# Patient Record
Sex: Male | Born: 1950
Health system: Southern US, Community
[De-identification: ages and names within clinical notes are randomized; demographics above are authoritative.]

## PROBLEM LIST (undated history)

## (undated) DIAGNOSIS — E785 Hyperlipidemia, unspecified: Secondary | ICD-10-CM

## (undated) DIAGNOSIS — F0390 Unspecified dementia without behavioral disturbance: Secondary | ICD-10-CM

## (undated) DIAGNOSIS — L03116 Cellulitis of left lower limb: Secondary | ICD-10-CM

## (undated) DIAGNOSIS — Z87440 Personal history of urinary (tract) infections: Secondary | ICD-10-CM

## (undated) DIAGNOSIS — K7460 Unspecified cirrhosis of liver: Secondary | ICD-10-CM

## (undated) DIAGNOSIS — K429 Umbilical hernia without obstruction or gangrene: Secondary | ICD-10-CM

## (undated) DIAGNOSIS — A539 Syphilis, unspecified: Secondary | ICD-10-CM

## (undated) DIAGNOSIS — M109 Gout, unspecified: Secondary | ICD-10-CM

## (undated) DIAGNOSIS — Z87448 Personal history of other diseases of urinary system: Secondary | ICD-10-CM

## (undated) DIAGNOSIS — D649 Anemia, unspecified: Secondary | ICD-10-CM

## (undated) DIAGNOSIS — J42 Unspecified chronic bronchitis: Secondary | ICD-10-CM

## (undated) DIAGNOSIS — Z87898 Personal history of other specified conditions: Secondary | ICD-10-CM

## (undated) DIAGNOSIS — I10 Essential (primary) hypertension: Secondary | ICD-10-CM

## (undated) DIAGNOSIS — Z96652 Presence of left artificial knee joint: Secondary | ICD-10-CM

## (undated) DIAGNOSIS — M1990 Unspecified osteoarthritis, unspecified site: Secondary | ICD-10-CM

## (undated) DIAGNOSIS — K219 Gastro-esophageal reflux disease without esophagitis: Secondary | ICD-10-CM

## (undated) DIAGNOSIS — B20 Human immunodeficiency virus [HIV] disease: Secondary | ICD-10-CM

## (undated) DIAGNOSIS — N400 Enlarged prostate without lower urinary tract symptoms: Secondary | ICD-10-CM

## (undated) DIAGNOSIS — B977 Papillomavirus as the cause of diseases classified elsewhere: Secondary | ICD-10-CM

## (undated) DIAGNOSIS — A6002 Herpesviral infection of other male genital organs: Secondary | ICD-10-CM

## (undated) DIAGNOSIS — B182 Chronic viral hepatitis C: Secondary | ICD-10-CM

## (undated) DIAGNOSIS — Z21 Asymptomatic human immunodeficiency virus [HIV] infection status: Secondary | ICD-10-CM

## (undated) DIAGNOSIS — K5900 Constipation, unspecified: Secondary | ICD-10-CM

## (undated) DIAGNOSIS — J449 Chronic obstructive pulmonary disease, unspecified: Secondary | ICD-10-CM

## (undated) HISTORY — PX: TOTAL KNEE ARTHROPLASTY: SHX125

## (undated) HISTORY — DX: Essential (primary) hypertension: I10

## (undated) HISTORY — DX: Papillomavirus as the cause of diseases classified elsewhere: B97.7

## (undated) HISTORY — DX: Cellulitis of left lower limb: L03.116

## (undated) HISTORY — DX: Presence of left artificial knee joint: Z96.652

## (undated) HISTORY — DX: Chronic viral hepatitis C: K74.60

## (undated) HISTORY — DX: Human immunodeficiency virus (HIV) disease: B20

## (undated) HISTORY — DX: Hyperlipidemia, unspecified: E78.5

## (undated) HISTORY — PX: HERNIA REPAIR: SHX51

## (undated) HISTORY — DX: Chronic viral hepatitis C: B18.2

## (undated) HISTORY — DX: Chronic obstructive pulmonary disease, unspecified: J44.9

## (undated) HISTORY — DX: Gastro-esophageal reflux disease without esophagitis: K21.9

## (undated) HISTORY — PX: COLONOSCOPY: SHX174

## (undated) HISTORY — DX: Benign prostatic hyperplasia without lower urinary tract symptoms: N40.0

## (undated) HISTORY — DX: Anemia, unspecified: D64.9

## (undated) HISTORY — DX: Asymptomatic human immunodeficiency virus (hiv) infection status: Z21

## (undated) HISTORY — PX: KNEE SURGERY: SHX244

---

## 2000-01-16 ENCOUNTER — Encounter (INDEPENDENT_AMBULATORY_CARE_PROVIDER_SITE_OTHER): Payer: Self-pay | Admitting: *Deleted

## 2000-01-16 ENCOUNTER — Encounter: Admission: RE | Admit: 2000-01-16 | Discharge: 2000-01-16 | Payer: Self-pay | Admitting: Infectious Diseases

## 2000-01-16 ENCOUNTER — Ambulatory Visit (HOSPITAL_COMMUNITY): Admission: RE | Admit: 2000-01-16 | Discharge: 2000-01-16 | Payer: Self-pay | Admitting: Infectious Diseases

## 2000-01-16 LAB — CONVERTED CEMR LAB: CD4 T Cell Abs: 390

## 2000-02-05 ENCOUNTER — Encounter: Admission: RE | Admit: 2000-02-05 | Discharge: 2000-02-05 | Payer: Self-pay | Admitting: Infectious Diseases

## 2000-04-07 ENCOUNTER — Encounter: Admission: RE | Admit: 2000-04-07 | Discharge: 2000-04-07 | Payer: Self-pay | Admitting: Internal Medicine

## 2000-04-13 ENCOUNTER — Encounter: Admission: RE | Admit: 2000-04-13 | Discharge: 2000-04-13 | Payer: Self-pay | Admitting: Infectious Diseases

## 2000-04-13 ENCOUNTER — Ambulatory Visit (HOSPITAL_COMMUNITY): Admission: RE | Admit: 2000-04-13 | Discharge: 2000-04-13 | Payer: Self-pay | Admitting: Infectious Diseases

## 2000-04-27 ENCOUNTER — Encounter: Admission: RE | Admit: 2000-04-27 | Discharge: 2000-04-27 | Payer: Self-pay | Admitting: Infectious Diseases

## 2000-05-25 ENCOUNTER — Encounter: Admission: RE | Admit: 2000-05-25 | Discharge: 2000-05-25 | Payer: Self-pay | Admitting: Infectious Diseases

## 2000-08-24 ENCOUNTER — Ambulatory Visit (HOSPITAL_COMMUNITY): Admission: RE | Admit: 2000-08-24 | Discharge: 2000-08-24 | Payer: Self-pay | Admitting: Infectious Diseases

## 2000-08-24 ENCOUNTER — Encounter: Admission: RE | Admit: 2000-08-24 | Discharge: 2000-08-24 | Payer: Self-pay | Admitting: Infectious Diseases

## 2000-09-09 ENCOUNTER — Encounter: Admission: RE | Admit: 2000-09-09 | Discharge: 2000-09-09 | Payer: Self-pay | Admitting: Infectious Diseases

## 2000-11-04 ENCOUNTER — Encounter: Admission: RE | Admit: 2000-11-04 | Discharge: 2000-11-04 | Payer: Self-pay | Admitting: Infectious Diseases

## 2001-01-05 ENCOUNTER — Ambulatory Visit (HOSPITAL_COMMUNITY): Admission: RE | Admit: 2001-01-05 | Discharge: 2001-01-05 | Payer: Self-pay | Admitting: Infectious Diseases

## 2001-01-05 ENCOUNTER — Encounter: Admission: RE | Admit: 2001-01-05 | Discharge: 2001-01-05 | Payer: Self-pay | Admitting: Infectious Diseases

## 2001-02-08 ENCOUNTER — Encounter: Admission: RE | Admit: 2001-02-08 | Discharge: 2001-02-08 | Payer: Self-pay | Admitting: Infectious Diseases

## 2001-03-11 ENCOUNTER — Encounter: Admission: RE | Admit: 2001-03-11 | Discharge: 2001-03-11 | Payer: Self-pay | Admitting: Internal Medicine

## 2001-05-17 ENCOUNTER — Encounter: Admission: RE | Admit: 2001-05-17 | Discharge: 2001-05-17 | Payer: Self-pay | Admitting: Infectious Diseases

## 2001-05-17 ENCOUNTER — Ambulatory Visit (HOSPITAL_COMMUNITY): Admission: RE | Admit: 2001-05-17 | Discharge: 2001-05-17 | Payer: Self-pay | Admitting: Infectious Diseases

## 2001-07-12 ENCOUNTER — Encounter: Admission: RE | Admit: 2001-07-12 | Discharge: 2001-07-12 | Payer: Self-pay | Admitting: Infectious Diseases

## 2001-09-10 ENCOUNTER — Ambulatory Visit (HOSPITAL_COMMUNITY): Admission: RE | Admit: 2001-09-10 | Discharge: 2001-09-10 | Payer: Self-pay | Admitting: Infectious Diseases

## 2001-09-10 ENCOUNTER — Encounter: Admission: RE | Admit: 2001-09-10 | Discharge: 2001-09-10 | Payer: Self-pay | Admitting: Infectious Diseases

## 2001-09-27 ENCOUNTER — Encounter: Admission: RE | Admit: 2001-09-27 | Discharge: 2001-09-27 | Payer: Self-pay | Admitting: Infectious Diseases

## 2001-11-09 ENCOUNTER — Encounter: Admission: RE | Admit: 2001-11-09 | Discharge: 2001-11-09 | Payer: Self-pay | Admitting: Infectious Diseases

## 2001-11-18 ENCOUNTER — Encounter: Admission: RE | Admit: 2001-11-18 | Discharge: 2001-11-18 | Payer: Self-pay | Admitting: Infectious Diseases

## 2002-01-03 ENCOUNTER — Ambulatory Visit (HOSPITAL_COMMUNITY): Admission: RE | Admit: 2002-01-03 | Discharge: 2002-01-03 | Payer: Self-pay | Admitting: Infectious Diseases

## 2002-01-03 ENCOUNTER — Encounter: Admission: RE | Admit: 2002-01-03 | Discharge: 2002-01-03 | Payer: Self-pay | Admitting: Infectious Diseases

## 2002-01-25 ENCOUNTER — Encounter: Admission: RE | Admit: 2002-01-25 | Discharge: 2002-01-25 | Payer: Self-pay | Admitting: Infectious Diseases

## 2002-02-14 ENCOUNTER — Encounter: Admission: RE | Admit: 2002-02-14 | Discharge: 2002-02-14 | Payer: Self-pay | Admitting: Infectious Diseases

## 2002-02-14 ENCOUNTER — Ambulatory Visit (HOSPITAL_COMMUNITY): Admission: RE | Admit: 2002-02-14 | Discharge: 2002-02-14 | Payer: Self-pay | Admitting: Infectious Diseases

## 2002-02-14 ENCOUNTER — Encounter: Payer: Self-pay | Admitting: Infectious Diseases

## 2002-02-21 ENCOUNTER — Encounter: Admission: RE | Admit: 2002-02-21 | Discharge: 2002-02-21 | Payer: Self-pay | Admitting: Infectious Diseases

## 2002-06-21 ENCOUNTER — Encounter: Admission: RE | Admit: 2002-06-21 | Discharge: 2002-06-21 | Payer: Self-pay | Admitting: Infectious Diseases

## 2002-06-21 ENCOUNTER — Encounter (INDEPENDENT_AMBULATORY_CARE_PROVIDER_SITE_OTHER): Payer: Self-pay | Admitting: Infectious Diseases

## 2002-07-07 ENCOUNTER — Encounter: Admission: RE | Admit: 2002-07-07 | Discharge: 2002-07-07 | Payer: Self-pay | Admitting: Infectious Diseases

## 2002-08-17 ENCOUNTER — Encounter: Admission: RE | Admit: 2002-08-17 | Discharge: 2002-08-17 | Payer: Self-pay | Admitting: Infectious Diseases

## 2002-10-05 ENCOUNTER — Encounter: Admission: RE | Admit: 2002-10-05 | Discharge: 2002-10-05 | Payer: Self-pay | Admitting: Infectious Diseases

## 2002-10-05 ENCOUNTER — Ambulatory Visit (HOSPITAL_COMMUNITY): Admission: RE | Admit: 2002-10-05 | Discharge: 2002-10-05 | Payer: Self-pay | Admitting: Infectious Diseases

## 2002-10-05 ENCOUNTER — Encounter (INDEPENDENT_AMBULATORY_CARE_PROVIDER_SITE_OTHER): Payer: Self-pay | Admitting: Infectious Diseases

## 2002-10-19 ENCOUNTER — Encounter: Admission: RE | Admit: 2002-10-19 | Discharge: 2002-10-19 | Payer: Self-pay | Admitting: Infectious Diseases

## 2003-02-06 ENCOUNTER — Encounter (INDEPENDENT_AMBULATORY_CARE_PROVIDER_SITE_OTHER): Payer: Self-pay | Admitting: Infectious Diseases

## 2003-02-06 ENCOUNTER — Ambulatory Visit (HOSPITAL_COMMUNITY): Admission: RE | Admit: 2003-02-06 | Discharge: 2003-02-06 | Payer: Self-pay | Admitting: Infectious Diseases

## 2003-02-06 ENCOUNTER — Encounter: Admission: RE | Admit: 2003-02-06 | Discharge: 2003-02-06 | Payer: Self-pay | Admitting: Infectious Diseases

## 2003-02-20 ENCOUNTER — Encounter: Admission: RE | Admit: 2003-02-20 | Discharge: 2003-02-20 | Payer: Self-pay | Admitting: Infectious Diseases

## 2003-05-22 ENCOUNTER — Ambulatory Visit (HOSPITAL_COMMUNITY): Admission: RE | Admit: 2003-05-22 | Discharge: 2003-05-22 | Payer: Self-pay | Admitting: Infectious Diseases

## 2003-05-22 ENCOUNTER — Encounter: Admission: RE | Admit: 2003-05-22 | Discharge: 2003-05-22 | Payer: Self-pay | Admitting: Infectious Diseases

## 2003-06-05 ENCOUNTER — Encounter: Admission: RE | Admit: 2003-06-05 | Discharge: 2003-06-05 | Payer: Self-pay | Admitting: Infectious Diseases

## 2003-10-11 ENCOUNTER — Ambulatory Visit: Payer: Self-pay | Admitting: Infectious Diseases

## 2003-10-11 ENCOUNTER — Ambulatory Visit (HOSPITAL_COMMUNITY): Admission: RE | Admit: 2003-10-11 | Discharge: 2003-10-11 | Payer: Self-pay | Admitting: Infectious Diseases

## 2003-10-25 ENCOUNTER — Ambulatory Visit: Payer: Self-pay | Admitting: Infectious Diseases

## 2004-01-08 ENCOUNTER — Ambulatory Visit (HOSPITAL_COMMUNITY): Admission: RE | Admit: 2004-01-08 | Discharge: 2004-01-08 | Payer: Self-pay | Admitting: Infectious Diseases

## 2004-01-08 ENCOUNTER — Ambulatory Visit: Payer: Self-pay | Admitting: Infectious Diseases

## 2004-01-26 ENCOUNTER — Ambulatory Visit: Payer: Self-pay | Admitting: Infectious Diseases

## 2004-04-18 ENCOUNTER — Ambulatory Visit (HOSPITAL_COMMUNITY): Admission: RE | Admit: 2004-04-18 | Discharge: 2004-04-18 | Payer: Self-pay | Admitting: Infectious Diseases

## 2004-04-18 ENCOUNTER — Ambulatory Visit: Payer: Self-pay | Admitting: Infectious Diseases

## 2004-05-01 ENCOUNTER — Ambulatory Visit: Payer: Self-pay | Admitting: Infectious Diseases

## 2004-08-07 ENCOUNTER — Ambulatory Visit: Payer: Self-pay | Admitting: Infectious Diseases

## 2004-08-07 ENCOUNTER — Ambulatory Visit (HOSPITAL_COMMUNITY): Admission: RE | Admit: 2004-08-07 | Discharge: 2004-08-07 | Payer: Self-pay | Admitting: Infectious Diseases

## 2004-08-26 ENCOUNTER — Ambulatory Visit: Payer: Self-pay | Admitting: Infectious Diseases

## 2004-12-02 ENCOUNTER — Ambulatory Visit: Payer: Self-pay | Admitting: Infectious Diseases

## 2004-12-02 ENCOUNTER — Encounter (INDEPENDENT_AMBULATORY_CARE_PROVIDER_SITE_OTHER): Payer: Self-pay | Admitting: *Deleted

## 2004-12-02 ENCOUNTER — Ambulatory Visit (HOSPITAL_COMMUNITY): Admission: RE | Admit: 2004-12-02 | Discharge: 2004-12-02 | Payer: Self-pay | Admitting: Infectious Diseases

## 2004-12-02 LAB — CONVERTED CEMR LAB
CD4 Count: 590 microliters
HIV 1 RNA Quant: 49 copies/mL

## 2004-12-23 ENCOUNTER — Ambulatory Visit: Payer: Self-pay | Admitting: Infectious Diseases

## 2005-03-24 ENCOUNTER — Emergency Department (HOSPITAL_COMMUNITY): Admission: EM | Admit: 2005-03-24 | Discharge: 2005-03-24 | Payer: Self-pay | Admitting: Emergency Medicine

## 2005-06-23 ENCOUNTER — Ambulatory Visit (HOSPITAL_COMMUNITY): Admission: RE | Admit: 2005-06-23 | Discharge: 2005-06-23 | Payer: Self-pay | Admitting: Infectious Diseases

## 2005-06-23 ENCOUNTER — Encounter (INDEPENDENT_AMBULATORY_CARE_PROVIDER_SITE_OTHER): Payer: Self-pay | Admitting: *Deleted

## 2005-06-23 ENCOUNTER — Ambulatory Visit: Payer: Self-pay | Admitting: Infectious Diseases

## 2005-06-23 LAB — CONVERTED CEMR LAB: HIV 1 RNA Quant: 49 copies/mL

## 2005-07-21 ENCOUNTER — Ambulatory Visit: Payer: Self-pay | Admitting: Infectious Diseases

## 2005-12-04 ENCOUNTER — Encounter: Admission: RE | Admit: 2005-12-04 | Discharge: 2005-12-04 | Payer: Self-pay | Admitting: Infectious Diseases

## 2005-12-04 ENCOUNTER — Encounter (INDEPENDENT_AMBULATORY_CARE_PROVIDER_SITE_OTHER): Payer: Self-pay | Admitting: *Deleted

## 2005-12-04 ENCOUNTER — Ambulatory Visit: Payer: Self-pay | Admitting: Infectious Diseases

## 2005-12-04 LAB — CONVERTED CEMR LAB
ALT: 21 units/L (ref 0–53)
Alkaline Phosphatase: 85 units/L (ref 39–117)
CD4 Count: 720 microliters
Creatinine, Ser: 1.23 mg/dL (ref 0.40–1.50)
Eosinophils Relative: 2 % (ref 0–4)
HIV 1 RNA Quant: 423 copies/mL
HIV 1 RNA Quant: 423 copies/mL — ABNORMAL HIGH (ref <50)
HIV-1 RNA Quant, Log: 2.63 — ABNORMAL HIGH (ref <1.70)
Hemoglobin: 14.2 g/dL (ref 13.9–16.8)
Lymphs Abs: 1.9 10*3/uL (ref 0.8–3.1)
MCHC: 34.1 g/dL (ref 33.1–35.4)
Monocytes Absolute: 0.6 10*3/uL (ref 0.2–0.7)
Neutro Abs: 2.6 10*3/uL (ref 1.8–6.8)
RBC: 4.58 M/uL (ref 4.20–5.50)

## 2005-12-15 ENCOUNTER — Ambulatory Visit: Payer: Self-pay | Admitting: Infectious Diseases

## 2006-03-30 ENCOUNTER — Encounter (INDEPENDENT_AMBULATORY_CARE_PROVIDER_SITE_OTHER): Payer: Self-pay | Admitting: *Deleted

## 2006-03-30 LAB — CONVERTED CEMR LAB: HCV Quantitative: 553000 intl units/mL

## 2006-04-12 ENCOUNTER — Encounter (INDEPENDENT_AMBULATORY_CARE_PROVIDER_SITE_OTHER): Payer: Self-pay | Admitting: *Deleted

## 2006-04-28 ENCOUNTER — Telehealth (INDEPENDENT_AMBULATORY_CARE_PROVIDER_SITE_OTHER): Payer: Self-pay | Admitting: Infectious Diseases

## 2006-06-22 ENCOUNTER — Ambulatory Visit: Payer: Self-pay | Admitting: Infectious Diseases

## 2006-06-22 ENCOUNTER — Encounter: Admission: RE | Admit: 2006-06-22 | Discharge: 2006-06-22 | Payer: Self-pay | Admitting: Infectious Diseases

## 2006-07-03 DIAGNOSIS — A512 Primary syphilis of other sites: Secondary | ICD-10-CM | POA: Insufficient documentation

## 2006-07-03 DIAGNOSIS — B20 Human immunodeficiency virus [HIV] disease: Secondary | ICD-10-CM

## 2006-07-03 DIAGNOSIS — B1710 Acute hepatitis C without hepatic coma: Secondary | ICD-10-CM | POA: Insufficient documentation

## 2006-07-03 DIAGNOSIS — M1990 Unspecified osteoarthritis, unspecified site: Secondary | ICD-10-CM | POA: Insufficient documentation

## 2006-07-06 ENCOUNTER — Ambulatory Visit: Payer: Self-pay | Admitting: Infectious Diseases

## 2006-07-20 ENCOUNTER — Encounter (INDEPENDENT_AMBULATORY_CARE_PROVIDER_SITE_OTHER): Payer: Self-pay | Admitting: Infectious Diseases

## 2006-10-26 ENCOUNTER — Encounter: Admission: RE | Admit: 2006-10-26 | Discharge: 2006-10-26 | Payer: Self-pay | Admitting: Infectious Disease

## 2006-10-26 ENCOUNTER — Ambulatory Visit: Payer: Self-pay | Admitting: Infectious Disease

## 2006-10-26 LAB — CONVERTED CEMR LAB
ALT: 29 units/L (ref 0–53)
Alkaline Phosphatase: 97 units/L (ref 39–117)
BUN: 24 mg/dL — ABNORMAL HIGH (ref 6–23)
Basophils Absolute: 0 10*3/uL (ref 0.0–0.1)
Basophils Relative: 0 % (ref 0–1)
CO2: 23 meq/L (ref 19–32)
Calcium: 9.3 mg/dL (ref 8.4–10.5)
Chloride: 103 meq/L (ref 96–112)
Eosinophils Absolute: 0.1 10*3/uL (ref 0.0–0.7)
Glucose, Bld: 100 mg/dL — ABNORMAL HIGH (ref 70–99)
HCT: 44.5 % (ref 39.0–52.0)
HIV 1 RNA Quant: 54 copies/mL — ABNORMAL HIGH (ref <50)
HIV-1 RNA Quant, Log: 1.73 — ABNORMAL HIGH (ref <1.70)
Lymphocytes Relative: 46 % (ref 12–46)
Lymphs Abs: 2.3 10*3/uL (ref 0.7–3.3)
MCV: 91 fL (ref 78.0–100.0)
Monocytes Absolute: 0.5 10*3/uL (ref 0.2–0.7)
Neutrophils Relative %: 40 % — ABNORMAL LOW (ref 43–77)
RBC: 4.89 M/uL (ref 4.22–5.81)
RDW: 13.5 % (ref 11.5–14.0)
Sodium: 137 meq/L (ref 135–145)
Total Bilirubin: 0.4 mg/dL (ref 0.3–1.2)

## 2006-11-19 ENCOUNTER — Ambulatory Visit: Payer: Self-pay | Admitting: Infectious Disease

## 2006-11-19 DIAGNOSIS — K429 Umbilical hernia without obstruction or gangrene: Secondary | ICD-10-CM | POA: Insufficient documentation

## 2006-11-19 DIAGNOSIS — J42 Unspecified chronic bronchitis: Secondary | ICD-10-CM | POA: Insufficient documentation

## 2006-11-19 DIAGNOSIS — M109 Gout, unspecified: Secondary | ICD-10-CM

## 2006-11-19 DIAGNOSIS — F17200 Nicotine dependence, unspecified, uncomplicated: Secondary | ICD-10-CM

## 2006-11-19 DIAGNOSIS — I10 Essential (primary) hypertension: Secondary | ICD-10-CM | POA: Insufficient documentation

## 2006-11-19 DIAGNOSIS — A6000 Herpesviral infection of urogenital system, unspecified: Secondary | ICD-10-CM | POA: Insufficient documentation

## 2006-11-19 DIAGNOSIS — J40 Bronchitis, not specified as acute or chronic: Secondary | ICD-10-CM | POA: Insufficient documentation

## 2006-11-24 ENCOUNTER — Ambulatory Visit: Payer: Self-pay | Admitting: Infectious Disease

## 2006-11-24 LAB — CONVERTED CEMR LAB
Cholesterol: 164 mg/dL (ref 0–200)
HDL: 51 mg/dL (ref >39)
Triglycerides: 141 mg/dL (ref <150)
Uric Acid, Serum: 4.6 mg/dL (ref 2.4–7.0)

## 2007-02-02 ENCOUNTER — Encounter: Payer: Self-pay | Admitting: Infectious Disease

## 2007-04-29 ENCOUNTER — Encounter: Payer: Self-pay | Admitting: Infectious Disease

## 2007-05-25 ENCOUNTER — Emergency Department (HOSPITAL_COMMUNITY): Admission: EM | Admit: 2007-05-25 | Discharge: 2007-05-25 | Payer: Self-pay | Admitting: Emergency Medicine

## 2007-05-26 ENCOUNTER — Telehealth: Payer: Self-pay | Admitting: Infectious Disease

## 2007-05-28 ENCOUNTER — Encounter: Payer: Self-pay | Admitting: Infectious Disease

## 2007-05-28 ENCOUNTER — Ambulatory Visit: Payer: Self-pay | Admitting: Internal Medicine

## 2007-05-28 DIAGNOSIS — N10 Acute tubulo-interstitial nephritis: Secondary | ICD-10-CM | POA: Insufficient documentation

## 2007-10-03 ENCOUNTER — Emergency Department (HOSPITAL_COMMUNITY): Admission: EM | Admit: 2007-10-03 | Discharge: 2007-10-03 | Payer: Self-pay | Admitting: Emergency Medicine

## 2007-10-07 ENCOUNTER — Emergency Department (HOSPITAL_COMMUNITY): Admission: EM | Admit: 2007-10-07 | Discharge: 2007-10-07 | Payer: Self-pay | Admitting: Emergency Medicine

## 2007-10-28 ENCOUNTER — Ambulatory Visit: Payer: Self-pay | Admitting: Infectious Disease

## 2007-10-28 LAB — CONVERTED CEMR LAB
AST: 57 units/L — ABNORMAL HIGH (ref 0–37)
Albumin: 3.9 g/dL (ref 3.5–5.2)
Alkaline Phosphatase: 98 units/L (ref 39–117)
BUN: 17 mg/dL (ref 6–23)
Basophils Absolute: 0 10*3/uL (ref 0.0–0.1)
Basophils Relative: 1 % (ref 0–1)
CO2: 21 meq/L (ref 19–32)
Calcium: 9.6 mg/dL (ref 8.4–10.5)
Cholesterol: 180 mg/dL (ref 0–200)
HDL: 69 mg/dL (ref >39)
HIV 1 RNA Quant: 497 copies/mL — ABNORMAL HIGH (ref <50)
HIV-1 RNA Quant, Log: 2.7 — ABNORMAL HIGH (ref <1.70)
Lymphs Abs: 1.5 10*3/uL (ref 0.7–4.0)
MCHC: 35 g/dL (ref 30.0–36.0)
Monocytes Absolute: 0.6 10*3/uL (ref 0.1–1.0)
Sodium: 137 meq/L (ref 135–145)
Total Bilirubin: 0.5 mg/dL (ref 0.3–1.2)
Total CHOL/HDL Ratio: 2.6
Triglycerides: 171 mg/dL — ABNORMAL HIGH (ref <150)
VLDL: 34 mg/dL (ref 0–40)

## 2007-11-15 ENCOUNTER — Ambulatory Visit: Payer: Self-pay | Admitting: Infectious Disease

## 2007-11-15 DIAGNOSIS — F068 Other specified mental disorders due to known physiological condition: Secondary | ICD-10-CM | POA: Insufficient documentation

## 2007-11-17 LAB — CONVERTED CEMR LAB
Chlamydia, Swab/Urine, PCR: NEGATIVE
GC Probe Amp, Urine: NEGATIVE

## 2007-11-22 ENCOUNTER — Ambulatory Visit (HOSPITAL_COMMUNITY): Admission: RE | Admit: 2007-11-22 | Discharge: 2007-11-22 | Payer: Self-pay | Admitting: Infectious Disease

## 2008-03-01 ENCOUNTER — Ambulatory Visit: Payer: Self-pay | Admitting: Infectious Disease

## 2008-03-01 ENCOUNTER — Encounter: Payer: Self-pay | Admitting: Infectious Disease

## 2008-03-01 LAB — CONVERTED CEMR LAB
ALT: 89 units/L — ABNORMAL HIGH (ref 0–53)
Albumin: 3.6 g/dL (ref 3.5–5.2)
Basophils Absolute: 0 10*3/uL (ref 0.0–0.1)
Cholesterol: 151 mg/dL (ref 0–200)
Creatinine, Ser: 1.1 mg/dL (ref 0.40–1.50)
Eosinophils Absolute: 0.1 10*3/uL (ref 0.0–0.7)
Eosinophils Relative: 3 % (ref 0–5)
HCT: 42.7 % (ref 39.0–52.0)
HCV Quantitative: 4440000 intl units/mL — ABNORMAL HIGH (ref <43)
HDL: 56 mg/dL (ref >39)
HIV 1 RNA Quant: <48 copies/mL (ref <48)
HIV-1 RNA Quant, Log: <1.68 (ref <1.68)
Hemoglobin: 14.3 g/dL (ref 13.0–17.0)
Lymphs Abs: 1.3 10*3/uL (ref 0.7–4.0)
Neutro Abs: 1.7 10*3/uL (ref 1.7–7.7)
Platelets: 214 10*3/uL (ref 150–400)
Potassium: 4.3 meq/L (ref 3.5–5.3)
RBC: 4.42 M/uL (ref 4.22–5.81)
Sodium: 142 meq/L (ref 135–145)
Total Bilirubin: 0.3 mg/dL (ref 0.3–1.2)
Total CHOL/HDL Ratio: 2.7
Triglycerides: 372 mg/dL — ABNORMAL HIGH (ref <150)
VLDL: 74 mg/dL — ABNORMAL HIGH (ref 0–40)

## 2008-03-02 ENCOUNTER — Encounter (INDEPENDENT_AMBULATORY_CARE_PROVIDER_SITE_OTHER): Payer: Self-pay | Admitting: Licensed Clinical Social Worker

## 2008-04-11 ENCOUNTER — Ambulatory Visit: Payer: Self-pay | Admitting: Infectious Disease

## 2008-04-11 LAB — CONVERTED CEMR LAB
ALT: 99 units/L — ABNORMAL HIGH (ref 0–53)
AST: 104 units/L — ABNORMAL HIGH (ref 0–37)
Basophils Absolute: 0 10*3/uL (ref 0.0–0.1)
Basophils Relative: 0 % (ref 0–1)
CO2: 25 meq/L (ref 19–32)
Eosinophils Absolute: 0.1 10*3/uL (ref 0.0–0.7)
Eosinophils Relative: 1 % (ref 0–5)
Glucose, Bld: 118 mg/dL — ABNORMAL HIGH (ref 70–99)
HIV-1 RNA Quant, Log: 2.3 — ABNORMAL HIGH (ref <1.68)
Hemoglobin: 14.7 g/dL (ref 13.0–17.0)
Lymphocytes Relative: 35 % (ref 12–46)
MCHC: 36.2 g/dL — ABNORMAL HIGH (ref 30.0–36.0)
Monocytes Relative: 14 % — ABNORMAL HIGH (ref 3–12)
Potassium: 4.1 meq/L (ref 3.5–5.3)
RBC: 4.46 M/uL (ref 4.22–5.81)
RDW: 13.7 % (ref 11.5–15.5)
Sodium: 140 meq/L (ref 135–145)
Total Protein: 8.2 g/dL (ref 6.0–8.3)
WBC: 4.6 10*3/uL (ref 4.0–10.5)

## 2008-04-26 ENCOUNTER — Ambulatory Visit: Payer: Self-pay | Admitting: Infectious Disease

## 2008-10-18 ENCOUNTER — Ambulatory Visit: Payer: Self-pay | Admitting: Infectious Disease

## 2008-10-18 LAB — CONVERTED CEMR LAB
ALT: 99 units/L — ABNORMAL HIGH (ref 0–53)
Basophils Absolute: 0 10*3/uL (ref 0.0–0.1)
Chloride: 104 meq/L (ref 96–112)
Eosinophils Absolute: 0.1 10*3/uL (ref 0.0–0.7)
Eosinophils Relative: 2 % (ref 0–5)
Glucose, Bld: 93 mg/dL (ref 70–99)
HCT: 42.3 % (ref 39.0–52.0)
HDL: 91 mg/dL (ref >39)
HIV 1 RNA Quant: 162 copies/mL — ABNORMAL HIGH (ref <48)
LDL Cholesterol: 59 mg/dL (ref 0–99)
Lymphs Abs: 1.5 10*3/uL (ref 0.7–4.0)
MCHC: 35 g/dL (ref 30.0–36.0)
Monocytes Absolute: 0.6 10*3/uL (ref 0.1–1.0)
Monocytes Relative: 10 % (ref 3–12)
Platelets: 164 10*3/uL (ref 150–400)
Potassium: 4.5 meq/L (ref 3.5–5.3)
Total Bilirubin: 0.5 mg/dL (ref 0.3–1.2)
Total Protein: 8.1 g/dL (ref 6.0–8.3)
Triglycerides: 118 mg/dL (ref <150)
VLDL: 24 mg/dL (ref 0–40)
WBC: 5.3 10*3/uL (ref 4.0–10.5)

## 2008-11-01 ENCOUNTER — Ambulatory Visit: Payer: Self-pay | Admitting: Infectious Disease

## 2008-11-01 DIAGNOSIS — M25569 Pain in unspecified knee: Secondary | ICD-10-CM

## 2008-11-06 ENCOUNTER — Telehealth: Payer: Self-pay | Admitting: Infectious Disease

## 2008-11-16 ENCOUNTER — Telehealth: Payer: Self-pay | Admitting: Infectious Disease

## 2009-03-29 ENCOUNTER — Ambulatory Visit: Payer: Self-pay | Admitting: Infectious Diseases

## 2009-03-29 LAB — CONVERTED CEMR LAB
ALT: 70 units/L — ABNORMAL HIGH (ref 0–53)
Albumin: 4 g/dL (ref 3.5–5.2)
Alkaline Phosphatase: 114 units/L (ref 39–117)
BUN: 24 mg/dL — ABNORMAL HIGH (ref 6–23)
Basophils Relative: 1 % (ref 0–1)
Calcium: 9.8 mg/dL (ref 8.4–10.5)
Eosinophils Absolute: 0.1 10*3/uL (ref 0.0–0.7)
Eosinophils Relative: 3 % (ref 0–5)
Glucose, Bld: 98 mg/dL (ref 70–99)
HDL: 60 mg/dL (ref >39)
HIV 1 RNA Quant: 54 copies/mL — ABNORMAL HIGH (ref <48)
HIV-1 RNA Quant, Log: 1.73 — ABNORMAL HIGH (ref <1.68)
Lymphs Abs: 1.8 10*3/uL (ref 0.7–4.0)
MCHC: 33.9 g/dL (ref 30.0–36.0)
MCV: 95.2 fL (ref >=78.0)
Neutrophils Relative %: 40 % — ABNORMAL LOW (ref 43–77)
Potassium: 4.3 meq/L (ref 3.5–5.3)
RBC: 4.21 M/uL — ABNORMAL LOW (ref 4.22–5.81)
VLDL: 35 mg/dL (ref 0–40)

## 2009-04-19 ENCOUNTER — Ambulatory Visit: Payer: Self-pay | Admitting: Infectious Disease

## 2009-04-26 ENCOUNTER — Encounter: Payer: Self-pay | Admitting: Infectious Disease

## 2009-05-07 ENCOUNTER — Telehealth: Payer: Self-pay | Admitting: Infectious Disease

## 2009-06-25 ENCOUNTER — Emergency Department (HOSPITAL_COMMUNITY): Admission: EM | Admit: 2009-06-25 | Discharge: 2009-06-25 | Payer: Self-pay | Admitting: Emergency Medicine

## 2009-10-22 ENCOUNTER — Encounter: Payer: Self-pay | Admitting: Infectious Disease

## 2009-10-29 ENCOUNTER — Ambulatory Visit: Payer: Self-pay | Admitting: Infectious Disease

## 2009-10-29 LAB — CONVERTED CEMR LAB
ALT: 24 units/L (ref 0–53)
Albumin: 4.2 g/dL (ref 3.5–5.2)
BUN: 21 mg/dL (ref 6–23)
Basophils Relative: 0 % (ref 0–1)
CO2: 23 meq/L (ref 19–32)
Calcium: 9.8 mg/dL (ref 8.4–10.5)
Glucose, Bld: 95 mg/dL (ref 70–99)
HIV-1 RNA Quant, Log: <1.3 (ref <1.30)
Lymphocytes Relative: 34 % (ref 12–46)
Lymphs Abs: 1.4 10*3/uL (ref 0.7–4.0)
MCHC: 35 g/dL (ref 30.0–36.0)
Monocytes Relative: 11 % (ref 3–12)
Neutro Abs: 2.2 10*3/uL (ref 1.7–7.7)
Potassium: 4.5 meq/L (ref 3.5–5.3)
RBC: 4.39 M/uL (ref 4.22–5.81)
Sodium: 140 meq/L (ref 135–145)
Total Protein: 7.6 g/dL (ref 6.0–8.3)
WBC: 4.1 10*3/uL (ref 4.0–10.5)

## 2009-11-12 ENCOUNTER — Ambulatory Visit: Payer: Self-pay | Admitting: Infectious Disease

## 2009-11-12 DIAGNOSIS — Z96659 Presence of unspecified artificial knee joint: Secondary | ICD-10-CM | POA: Insufficient documentation

## 2009-11-14 ENCOUNTER — Encounter: Payer: Self-pay | Admitting: Infectious Disease

## 2010-01-18 ENCOUNTER — Encounter (INDEPENDENT_AMBULATORY_CARE_PROVIDER_SITE_OTHER): Payer: Self-pay | Admitting: *Deleted

## 2010-02-24 ENCOUNTER — Encounter: Payer: Self-pay | Admitting: Infectious Disease

## 2010-03-03 LAB — CONVERTED CEMR LAB
ALT: 16 units/L (ref 0–53)
AST: 17 units/L (ref 0–37)
Albumin: 4 g/dL (ref 3.5–5.2)
Alkaline Phosphatase: 90 units/L (ref 39–117)
Basophils Absolute: 0 10*3/uL (ref 0.0–0.1)
Basophils Relative: 0 % (ref 0–1)
CO2: 23 meq/L (ref 19–32)
Calcium: 9.7 mg/dL (ref 8.4–10.5)
Chloride: 108 meq/L (ref 96–112)
Cholesterol: 202 mg/dL — ABNORMAL HIGH (ref 0–200)
Creatinine, Ser: 1.18 mg/dL (ref 0.40–1.50)
Eosinophils Relative: 2 % (ref 0–5)
Hemoglobin, Urine: NEGATIVE
Ketones, ur: NEGATIVE mg/dL
Leukocytes, UA: NEGATIVE
Lymphocytes Relative: 38 % (ref 12–46)
Neutrophils Relative %: 51 % (ref 43–77)
Protein, ur: NEGATIVE mg/dL
RDW: 14.7 % — ABNORMAL HIGH (ref 11.5–14.0)
Sodium: 140 meq/L (ref 135–145)
Total Bilirubin: 0.2 mg/dL — ABNORMAL LOW (ref 0.3–1.2)
Total CHOL/HDL Ratio: 4.9
Triglycerides: 421 mg/dL — ABNORMAL HIGH (ref <150)
WBC: 4.7 10*3/uL (ref 4.0–10.5)

## 2010-03-05 NOTE — Assessment & Plan Note (Signed)
Summary: resfrom 3/10   Visit Type:  Follow-up  CC:  follow-up visit and lab results.  History of Present Illness: 60 year old African American male with HIV, well controlled on atripla  and hepatitis C , geontype 1a, VL>4 million, HTN  We reviewed his labs with his VL now down to 54 and his CD4. His BP was not at goal and I had wanted to increase his lisinopril but he canot read and I wanted to make sure he fully understood which medicine to increase.  Problems Prior to Update: 1)  Knee Pain, Acute  (ICD-719.46) 2)  Dementia, Mild  (ICD-294.8) 3)  Pyelonephritis, Acute  (ICD-590.10) 4)  Bronchitis Nos  (ICD-490) 5)  Hernia, Umbilical  (ICD-553.1) 6)  Gout Nos  (ICD-274.9) 7)  Smoker  (ICD-305.1) 8)  Bronchitis, Chronic Nec  (ICD-491.8) 9)  Herpes, Genital Nec  (ICD-054.19) 10)  Hypertension, Benign Essential  (ICD-401.1) 11)  Syphilis, Early, Symptomatic, Primary Nec  (ICD-091.2) 12)  Osteoarthritis  (ICD-715.90) 13)  HIV Disease  (ICD-042) 14)  Hepatitis C  (ICD-070.51)  Medications Prior to Update: 1)  Atripla 600-200-300 Mg Tabs (Efavirenz-Emtricitab-Tenofovir) .... Take 1 Tablet By Mouth At Bedtime 2)  Valtrex 500 Mg  Tabs (Valacyclovir Hcl) .... Take 1 Tablet By Mouth Once A Day 3)  Hydrochlorothiazide 25 Mg  Tabs (Hydrochlorothiazide) .... Take 1 Tablet By Mouth Once A Day 4)  Lisinopril 10 Mg  Tabs (Lisinopril) .... Take 1 Tablet By Mouth Once A Day 5)  Ventolin Hfa 108 (90 Base) Mcg/act  Aers (Albuterol Sulfate) .... Two Puffs Every 6 Hours As Needed For Cough, Dyspnea 6)  Hydrocodone-Acetaminophen 5-500 Mg Tabs (Hydrocodone-Acetaminophen) .... Take One Pill Twic A Day As Needed For Pain 7)  Blood Pressure  Kit (Misc. Devices) .... Please Fit With Proper Automatic Cuff 8)  Flovent Diskus 100 Mcg/blist Aepb (Fluticasone Propionate (Inhal)) .... Two Puffs Twice Daily 9)  Qvar 80 Mcg/act Aers (Beclomethasone Dipropionate) .... Tatke Two Puffs Twice Daily (Rise With Mouthwash  After Use)  Current Medications (verified): 1)  Atripla 600-200-300 Mg Tabs (Efavirenz-Emtricitab-Tenofovir) .... Take 1 Tablet By Mouth At Bedtime 2)  Valtrex 500 Mg  Tabs (Valacyclovir Hcl) .... Take 1 Tablet By Mouth Once A Day 3)  Hydrochlorothiazide 25 Mg  Tabs (Hydrochlorothiazide) .... Take 1 Tablet By Mouth Once A Day 4)  Lisinopril 10 Mg  Tabs (Lisinopril) .... Take 1 Tablet By Mouth Once A Day 5)  Ventolin Hfa 108 (90 Base) Mcg/act  Aers (Albuterol Sulfate) .... Two Puffs Every 6 Hours As Needed For Cough, Dyspnea 6)  Hydrocodone-Acetaminophen 5-500 Mg Tabs (Hydrocodone-Acetaminophen) .... Take One Pill Twic A Day As Needed For Pain 7)  Blood Pressure  Kit (Misc. Devices) .... Please Fit With Proper Automatic Cuff 8)  Flovent Diskus 100 Mcg/blist Aepb (Fluticasone Propionate (Inhal)) .... Two Puffs Twice Daily 9)  Qvar 80 Mcg/act Aers (Beclomethasone Dipropionate) .... Tatke Two Puffs Twice Daily (Rise With Mouthwash After Use)  Allergies (verified): No Known Drug Allergies   Preventive Screening-Counseling & Management  Alcohol-Tobacco     Alcohol drinks/day: 0     Alcohol type: beer     Smoking Status: current     Smoking Cessation Counseling: 06/23/2005     Packs/Day: <0.25  Caffeine-Diet-Exercise     Caffeine use/day: tea and sodas     Does Patient Exercise: yes     Type of exercise: walking     Times/week: 5 days  Hep-HIV-STD-Contraception     HIV Risk  Counseling: 06/23/2005  Safety-Violence-Falls     Seat Belt Use: yes  Comments: pt. given condoms   Current Allergies (reviewed today): No known allergies  Past History:  Past Medical History: Last updated: 04/26/2008 Hepatitis C HIV disease Osteoarthritis, left knee Primary syphilis (All of his RPRs have been negative here going back to 2007) Dementia Likely cirrhosis Alcohol use (says he quit now)  Social History: Last updated: 04/26/2008 Current Smoker Alcohol use-NO Drug use-yes  Risk  Factors: Alcohol Use: 0 (04/19/2009) Caffeine Use: tea and sodas (04/19/2009) Exercise: yes (04/19/2009)  Risk Factors: Smoking Status: current (04/19/2009) Packs/Day: <0.25 (04/19/2009)  Review of Systems  The patient denies anorexia, fever, weight loss, weight gain, vision loss, decreased hearing, hoarseness, chest pain, syncope, dyspnea on exertion, peripheral edema, prolonged cough, headaches, hemoptysis, abdominal pain, melena, hematochezia, severe indigestion/heartburn, hematuria, incontinence, genital sores, muscle weakness, suspicious skin lesions, transient blindness, difficulty walking, depression, unusual weight change, abnormal bleeding, and enlarged lymph nodes.    Vital Signs:  Patient profile:   60 year old male Height:      69 inches (175.26 cm) Weight:      183.5 pounds (83.41 kg) BMI:     27.20 Temp:     97.5 degrees F (36.39 degrees C) oral Pulse rate:   70 / minute BP sitting:   133 / 91  (left arm)  Vitals Entered By: Wendall Mola CMA Duncan Dull) (April 19, 2009 10:27 AM) CC: follow-up visit, lab results Is Patient Diabetic? No Pain Assessment Patient in pain? no      Nutritional Status BMI of 25 - 29 = overweight Nutritional Status Detail appetite "good"  Does patient need assistance? Functional Status Self care Ambulation Normal Comments no missed doses of meds per patient   Physical Exam  General:  alert.  well-nourished and well-hydrated.   Head:  normocephalic and atraumatic.   Eyes:  vision grossly intact.   Ears:  no external deformities.   Nose:  no external deformity and no external erythema.   Mouth:  ,pharynx pink and moist, no erythema, and no exudates.   Neck:  supple and full ROM.   Lungs:  normal respiratory effort, no crackles, and no wheezes.   Heart:  normal rate, regular rhythm, no murmur, no gallop, and no rub.   Abdomen:  soft, non-tender, normal bowel sounds, and no distention.   Neurologic:  alert, oriented to clinic,  has trouble at times recalling specifics of recent events Skin:  no suspicious lesions and no ecchymoses.   Psych:  in good spirits        Medication Adherence: 04/19/2009   Adherence to medications reviewed with patient. Counseling to provide adequate adherence provided   Prevention For Positives: 04/19/2009   Safe sex practices discussed with patient. Condoms offered.   Education Materials Provided: 04/19/2009 Safe sex practices discussed with patient. Condoms offered.                          Impression & Recommendations:  Problem # 1:  HIV DISEASE (ICD-042) Excellent virological control! Diagnostics Reviewed:  HIV: HIV positive CDC defined AIDS   CD4: 760 (03/30/2009)   WBC: 4.1 (03/29/2009)   PMN (bands): 0 (04/11/2008)   Hgb: 13.6 (03/29/2009)   HCT: 40.1 (03/29/2009)   Platelets: 179 (03/29/2009) HIV genotype: TNP (11/15/2007)   HIV-1 RNA: 54 (03/29/2009)   HBSAg: No (03/30/2006)  Problem # 2:  HYPERTENSION, BENIGN ESSENTIAL (ICD-401.1) When he can bring his  medications in I will have increase his lisinopri to 20mg  and then recheck bmp a week after increasing the dose His updated medication list for this problem includes:    Hydrochlorothiazide 25 Mg Tabs (Hydrochlorothiazide) .Marland Kitchen... Take 1 tablet by mouth once a day    Lisinopril 10 Mg Tabs (Lisinopril) .Marland Kitchen... Take 1 tablet by mouth once a day  Orders: Est. Patient Level IV (99214)Future Orders: T-Lipid Profile ([id number]) ... 08/06/2009  Problem # 3:  DEMENTIA, MILD (ICD-294.8)  He has help with his girilfriend and anoter lady who helps him.  Orders: Est. Patient Level IV (29528)  Problem # 4:  SYPHILIS, EARLY, SYMPTOMATIC, PRIMARY NEC (ICD-091.2)  as mentioned before all rprs since 2007 negative  Orders: Est. Patient Level IV (41324)  Problem # 5:  OSTEOARTHRITIS (ICD-715.90) managing symptomatically. Has been seen by ortho His updated medication list for this problem includes:     Hydrocodone-acetaminophen 5-500 Mg Tabs (Hydrocodone-acetaminophen) .Marland Kitchen... Take one pill twic a day as needed for pain  Problem # 6:  HEPATITIS C (ICD-070.51) Not a good candidate for Hep C rx with his dementia Orders: Est. Patient Level IV (40102)  Other Orders: T-GC Probe, urine ([id number]) T-Chlamydia  Probe, urine ([id number]) T-Syphilis Test (RPR) ([phone]) Future Orders: T-HIV Viral Load ([id number]) ... 08/06/2009 T-CBC w/Diff ([id number]) ... 08/06/2009 T-Comprehensive Metabolic Panel ([id number]) ... 08/06/2009  Patient Instructions: 1)  rtc in 4 months 2)  come in fasting for labs 3)  bring ALL of your medicines with you   Process Orders Check Orders Results:     [facility]: ABN not required for this insurance Tests Sent for requisitioning (April 19, 2009 11:33 PM):     04/19/2009: [facility] -- [employer], urine [phone] (signed)     04/19/2009: [facility] -- T-Chlamydia  Probe, urine [phone] (signed)     08/06/2009: [facility] -- T-HIV Viral Load [phone] (signed)     08/06/2009: [facility] -- T-CBC w/Diff [[id number]] (signed)     08/06/2009: [facility] -- T-Comprehensive Metabolic Panel [[id number]] (signed)     08/06/2009: [facility] -- T-Lipid Profile [phone] (signed)     04/19/2009: [facility] -- T-Syphilis Test (RPR) [phone] (signed)

## 2010-03-05 NOTE — Medication Information (Signed)
Summary: [place] Medicaid: RX  [place] Medicaid: RX   Imported By: Florinda Marker 11/27/2009 13:32:13  _____________________________________________________________________  External Attachment:    Type:   Image     Comment:   External Document

## 2010-03-05 NOTE — Progress Notes (Signed)
Summary: Srugical clearance  Phone Note Call from Patient   Caller: Wife- [phone] Summary of Call: Pt needs clearance for knee replacement  surgery with Dr Jillyn Hidden .   Fax [phone]  phone [phone] Initial call taken by: Tomasita Morrow RN,  May 07, 2009 10:38 AM  Follow-up for Phone Call        I dont understand what they mean by surgical clearance. HIs HIV is really well controlled if that is what they are concerned about (that is likely what they are concerned about) Follow-up by: Acey Lav MD,  May 07, 2009 12:07 PM     Appended Document: Srugical clearance I left message on their voicemail and left them my pager

## 2010-03-05 NOTE — Assessment & Plan Note (Signed)
Summary: [name]  Prior Medications: ATRIPLA 600-200-300 MG TABS (EFAVIRENZ-EMTRICITAB-TENOFOVIR) Take 1 tablet by mouth at bedtime VALTREX 500 MG  TABS (VALACYCLOVIR HCL) Take 1 tablet by mouth once a day HYDROCHLOROTHIAZIDE 25 MG  TABS (HYDROCHLOROTHIAZIDE) Take 1 tablet by mouth once a day LISINOPRIL 10 MG  TABS (LISINOPRIL) Take 1 tablet by mouth once a day VENTOLIN HFA 108 (90 BASE) MCG/ACT  AERS (ALBUTEROL SULFATE) two puffs every 6 hours as needed for cough, dyspnea HYDROCODONE-ACETAMINOPHEN 5-500 MG TABS (HYDROCODONE-ACETAMINOPHEN) take one pill twic a day as needed for pain BLOOD PRESSURE  KIT (MISC. DEVICES) please fit with proper automatic cuff FLOVENT DISKUS 100 MCG/BLIST AEPB (FLUTICASONE PROPIONATE (INHAL)) two puffs twice daily QVAR 80 MCG/ACT AERS (BECLOMETHASONE DIPROPIONATE) tatke two puffs twice daily (rise with mouthwash after use) Current Allergies: No known allergies  Immunizations Administered:  Influenza Vaccine # 1:    Vaccine Type: Fluvax 3+    Site: left deltoid    Mfr: [employer]    Dose: 0.5 ml    Route: IM    Given by: Starleen Arms CMA    Exp. Date: 05/05/2010    Lot #: [id number]    VIS given: 08/28/09 version given October 29, 2009.  Flu Vaccine Consent Questions:    Do you have a history of severe allergic reactions to this vaccine? no    Any prior history of allergic reactions to egg and/or gelatin? no    Do you have a sensitivity to the preservative Thimersol? no    Do you have a past history of Guillan-Barre Syndrome? no    Do you currently have an acute febrile illness? no    Have you ever had a severe reaction to latex? no    Vaccine information given and explained to patient? yes  Orders Added: 1)  Flu Vaccine 74yrs + [90658] 2)  Admin 1st Vaccine [09811]

## 2010-03-05 NOTE — Assessment & Plan Note (Signed)
Summary: 60month f/u [mkj]   Visit Type:  Follow-up Primary Provider:  Paulette Blanch Dam MD   History of Present Illness: 60 year old African American male with HIV, well controlled on atripla  and hepatitis C , geontype 1a, VL>4 million, HTN. He had TKA on the left in April and has done quite well. He recounts suffering from delirium in SNF in April but otherwise did quite well. His knee only causes him minimal pain nowadays though he did request for vicodin rx for one month. Over 45 minutes spent with this pt inlcuidng greater than 50 % time face to face counselling of the pt  Hypertension History:      Positive major cardiovascular risk factors include male age 61 years old or older, hypertension, and current tobacco user.    Problems Prior to Update: 1)  Knee Pain, Acute  (ICD-719.46) 2)  Dementia, Mild  (ICD-294.8) 3)  Pyelonephritis, Acute  (ICD-590.10) 4)  Bronchitis Nos  (ICD-490) 5)  Hernia, Umbilical  (ICD-553.1) 6)  Gout Nos  (ICD-274.9) 7)  Smoker  (ICD-305.1) 8)  Bronchitis, Chronic Nec  (ICD-491.8) 9)  Herpes, Genital Nec  (ICD-054.19) 10)  Hypertension, Benign Essential  (ICD-401.1) 11)  Syphilis, Early, Symptomatic, Primary Nec  (ICD-091.2) 12)  Osteoarthritis  (ICD-715.90) 13)  HIV Disease  (ICD-042) 14)  Hepatitis C  (ICD-070.51)  Medications Prior to Update: 1)  Atripla 600-200-300 Mg Tabs (Efavirenz-Emtricitab-Tenofovir) .... Take 1 Tablet By Mouth At Bedtime 2)  Valtrex 500 Mg  Tabs (Valacyclovir Hcl) .... Take 1 Tablet By Mouth Once A Day 3)  Hydrochlorothiazide 25 Mg  Tabs (Hydrochlorothiazide) .... Take 1 Tablet By Mouth Once A Day 4)  Lisinopril 10 Mg  Tabs (Lisinopril) .... Take 1 Tablet By Mouth Once A Day 5)  Ventolin Hfa 108 (90 Base) Mcg/act  Aers (Albuterol Sulfate) .... Two Puffs Every 6 Hours As Needed For Cough, Dyspnea 6)  Hydrocodone-Acetaminophen 5-500 Mg Tabs (Hydrocodone-Acetaminophen) .... Take One Pill Twic A Day As Needed For Pain 7)  Blood  Pressure  Kit (Misc. Devices) .... Please Fit With Proper Automatic Cuff 8)  Flovent Diskus 100 Mcg/blist Aepb (Fluticasone Propionate (Inhal)) .... Two Puffs Twice Daily 9)  Qvar 80 Mcg/act Aers (Beclomethasone Dipropionate) .... Tatke Two Puffs Twice Daily (Rise With Mouthwash After Use)  Current Medications (verified): 1)  Atripla 600-200-300 Mg Tabs (Efavirenz-Emtricitab-Tenofovir) .... Take 1 Tablet By Mouth At Bedtime 2)  Valtrex 500 Mg  Tabs (Valacyclovir Hcl) .... Take 1 Tablet By Mouth Once A Day 3)  Hydrochlorothiazide 25 Mg  Tabs (Hydrochlorothiazide) .... Take 1 Tablet By Mouth Once A Day 4)  Lisinopril 10 Mg  Tabs (Lisinopril) .... Take 1 Tablet By Mouth Once A Day 5)  Ventolin Hfa 108 (90 Base) Mcg/act  Aers (Albuterol Sulfate) .... Two Puffs Every 6 Hours As Needed For Cough, Dyspnea 6)  Hydrocodone-Acetaminophen 5-500 Mg Tabs (Hydrocodone-Acetaminophen) .... Take One Pill Twic A Day As Needed For Pain 7)  Blood Pressure  Kit (Misc. Devices) .... Please Fit With Proper Automatic Cuff 8)  Flovent Diskus 100 Mcg/blist Aepb (Fluticasone Propionate (Inhal)) .... Two Puffs Twice Daily  Allergies: No Known Drug Allergies     Current Allergies: No known allergies  Review of Systems  The patient denies anorexia, fever, weight loss, weight gain, vision loss, decreased hearing, hoarseness, chest pain, syncope, dyspnea on exertion, peripheral edema, prolonged cough, headaches, hemoptysis, abdominal pain, melena, hematochezia, severe indigestion/heartburn, hematuria, incontinence, genital sores, muscle weakness, suspicious skin lesions, transient blindness,  difficulty walking, depression, unusual weight change, abnormal bleeding, and enlarged lymph nodes.    Physical Exam  General:  alert.  well-nourished and well-hydrated.   Head:  normocephalic and atraumatic.   Eyes:  vision grossly intact.  pupils equal, pupils round, and pupils reactive to light.   Ears:  no external  deformities.   Nose:  no external deformity and no external erythema.   Mouth:  ,pharynx pink and moist, no erythema, and no exudates.   Neck:  supple and full ROM.   Lungs:  normal respiratory effort, no crackles, and no wheezes.   Heart:  normal rate, regular rhythm, no murmur, no gallop, and no rub.   Abdomen:  soft, non-tender, normal bowel sounds, and no distention.          Medication Adherence: 11/12/2009   Adherence to medications reviewed with patient. Counseling to provide adequate adherence provided   Prevention For Positives: 11/12/2009   Safe sex practices discussed with patient. Condoms offered.   Education Materials Provided: 11/12/2009 Safe sex practices discussed with patient. Condoms offered.                          Impression & Recommendations:  Problem # 1:  HIV DISEASE (ICD-042)  Excellent control His updated medication list for this problem includes:    Valtrex 500 Mg Tabs (Valacyclovir hcl) .Marland Kitchen... Take 1 tablet by mouth once a day  His updated medication list for this problem includes:    Valtrex 500 Mg Tabs (Valacyclovir hcl) .Marland Kitchen... Take 1 tablet by mouth once a day  Diagnostics Reviewed:  HIV: CDC-defined AIDS (04/19/2009)   CD4: 470 (10/30/2009)   WBC: 4.1 (10/29/2009)   PMN (bands): 0 (04/11/2008)   Hgb: 13.7 (10/29/2009)   HCT: 39.1 (10/29/2009)   Platelets: 186 (10/29/2009) HIV genotype: TNP (11/15/2007)   HIV-1 RNA: <20 copies/mL (10/29/2009)   HBSAg: No (03/30/2006)  Orders: Est. Patient Level V (16109)  Problem # 2:  TOTAL KNEE REPLACEMENT, LEFT, HX OF (ICD-V43.65)  doing well post surgery  Orders: Est. Patient Level V (60454)  Problem # 3:  BRONCHITIS NOS (ICD-490)  relatively well controlled, lungs clear The following medications were removed from the medication list:    Qvar 80 Mcg/act Aers (Beclomethasone dipropionate) .Marland Kitchen... Tatke two puffs twice daily (rise with mouthwash after use) His updated medication list for this problem  includes:    Ventolin Hfa 108 (90 Base) Mcg/act Aers (Albuterol sulfate) .Marland Kitchen..Marland Kitchen Two puffs every 6 hours as needed for cough, dyspnea    Flovent Diskus 100 Mcg/blist Aepb (Fluticasone propionate (inhal)) .Marland Kitchen..Marland Kitchen Two puffs twice daily  Orders: Est. Patient Level V (09811)  Problem # 4:  HYPERTENSION, BENIGN ESSENTIAL (ICD-401.1)  reasonable control His updated medication list for this problem includes:    Hydrochlorothiazide 25 Mg Tabs (Hydrochlorothiazide) .Marland Kitchen... Take 1 tablet by mouth once a day    Lisinopril 10 Mg Tabs (Lisinopril) .Marland Kitchen... Take 1 tablet by mouth once a day  Prior BP: 133/91 (04/19/2009)  10 Yr Risk Heart Disease: 9 %  Labs Reviewed: K+: 4.5 (10/29/2009) Creat: : 0.96 (10/29/2009)   Chol: 168 (03/29/2009)   HDL: 60 (03/29/2009)   LDL: 73 (03/29/2009)   TG: 177 (03/29/2009)  Orders: Est. Patient Level V (91478)  Problem # 5:  HEPATITIS C (ICD-070.51)  double check if he has not been referred to [facility] hepatology. He might tolerate a shorter course with new drugs available.  Orders: Est. Patient Level V (  81191)  Problem # 6:  SMOKER (ICD-305.1) down to one pack per month, counselled to quit  Problem # 7:  DEMENTIA, MILD (ICD-294.8)  appears stable at present  Orders: Est. Patient Level V (47829)  Other Orders: T-GC Probe, urine ([id number]) T-Chlamydia  Probe, urine [id number]) Future Orders: T-CD4SP ([facility]) (CD4SP) ... 05/11/2010 T-HIV Viral Load [id number]) ... 05/11/2010 T-CBC w/Diff ([id number]) ... 05/11/2010 T-Comprehensive Metabolic Panel [id number]) ... 05/11/2010 T-Lipid Profile [phone]) ... 05/11/2010 T-RPR (Syphilis) [phone]) ... 05/11/2010  Hypertension Assessment/Plan:      The patient's hypertensive risk group is category B: At least one risk factor (excluding diabetes) with no target organ damage.  His calculated 10 year risk of coronary heart disease is 9 %.    Patient Instructions: 1)  Advised not to eat any food or  drink any liquids after 10 PM the night before procedure. 2)  Please schedule a follow-up appointment in 6 months. 3)  Be sure to return for lab work one (1) week before your next appointment as scheduled.   Prescriptions: HYDROCODONE-ACETAMINOPHEN 5-500 MG TABS (HYDROCODONE-ACETAMINOPHEN) take one pill twic a day as needed for pain  #30 x 0   Entered and Authorized by:   Acey Lav MD   Signed by:   Paulette Blanch Dam MD on 11/12/2009   Method used:   Print then Give to Patient   RxID:   [id number] FLOVENT DISKUS 100 MCG/BLIST AEPB (FLUTICASONE PROPIONATE (INHAL)) two puffs twice daily  #1 x 11   Entered and Authorized by:   Acey Lav MD   Signed by:   Paulette Blanch Dam MD on 11/12/2009   Method used:   Electronically to        [facility] [address] (retail)       [address]       [place], [place]  [address]       Ph: [phone] or [phone]       Fax: [phone]   RxID:   [id number] VENTOLIN HFA 108 (90 BASE) MCG/ACT  AERS (ALBUTEROL SULFATE) two puffs every 6 hours as needed for cough, dyspnea  #2 x 12   Entered and Authorized by:   Acey Lav MD   Signed by:   Paulette Blanch Dam MD on 11/12/2009   Method used:   Electronically to        [facility] [address] (retail)       [address]       [place], [place]  [address]       Ph: [phone] or [phone]       Fax: [phone]   RxID:   [id number] LISINOPRIL 10 MG  TABS (LISINOPRIL) Take 1 tablet by mouth once a day  #31 x 12   Entered and Authorized by:   Acey Lav MD   Signed by:   Paulette Blanch Dam MD on 11/12/2009   Method used:   Electronically to        [facility] [address] (retail)       [address]       [place], [place]  [address]       Ph: [phone] or [phone]       Fax: [phone]   RxID:   [id number] HYDROCHLOROTHIAZIDE 25 MG  TABS  (HYDROCHLOROTHIAZIDE) Take 1 tablet  by mouth once a day  #31 x 12   Entered and Authorized by:   Acey Lav MD   Signed by:   Paulette Blanch Dam MD on 11/12/2009   Method used:   Electronically to        [facility] [address] (retail)       [address]       [place], [place]  [address]       Ph: [phone] or [phone]       Fax: [phone]   RxID:   [id number] VALTREX 500 MG  TABS (VALACYCLOVIR HCL) Take 1 tablet by mouth once a day  #31 x 11   Entered and Authorized by:   Acey Lav MD   Signed by:   Paulette Blanch Dam MD on 11/12/2009   Method used:   Electronically to        [facility] [address] (retail)       [address]       [place], [place]  [address]       Ph: [phone] or [phone]       Fax: [phone]   RxID:   [id number] ATRIPLA 600-200-300 MG TABS (EFAVIRENZ-EMTRICITAB-TENOFOVIR) Take 1 tablet by mouth at bedtime  #31 x 12   Entered and Authorized by:   Acey Lav MD   Signed by:   Paulette Blanch Dam MD on 11/12/2009   Method used:   Electronically to        [facility] [address] (retail)       [address]       [place], [place]  [address]       Ph: [phone] or [phone]       Fax: [phone]   RxID:   [id number]   Appended Document: Pt needs QVAR, per [facility]    Clinical Lists Changes  Medications: Removed medication of FLOVENT DISKUS 100 MCG/BLIST AEPB (FLUTICASONE PROPIONATE (INHAL)) two puffs twice daily Added new medication of QVAR 80 MCG/ACT AERS (BECLOMETHASONE DIPROPIONATE) two puffs inhaled bid - Signed Rx of QVAR 80 MCG/ACT AERS (BECLOMETHASONE DIPROPIONATE) two puffs inhaled bid;  #1bottle x 11;  Signed;  Entered by: Acey Lav MD;  Authorized by: Paulette Blanch Dam MD;  Method used: Electronically to [facility] [address], [place], [place],  [place]  [address], Ph: [phone] or [phone], Fax: [phone]    Prescriptions: QVAR 80 MCG/ACT AERS (BECLOMETHASONE DIPROPIONATE) two puffs inhaled bid  #1bottle x 11   Entered and Authorized by:   Acey Lav MD   Signed by:   Paulette Blanch Dam MD on 11/13/2009   Method used:   Electronically to        [facility] [address] (retail)       [address]       [place], [place]  [address]       Ph: [phone] or [phone]       Fax: [phone]   RxID:   [id number]

## 2010-03-05 NOTE — Letter (Signed)
Summary: [facility]   Imported By: Florinda Marker 11/27/2009 13:33:11  _____________________________________________________________________  External Attachment:    Type:   Image     Comment:   External Document

## 2010-03-05 NOTE — Consult Note (Signed)
Summary: [facility]  [facility]   Imported By: Florinda Marker 05/15/2009 15:33:46  _____________________________________________________________________  External Attachment:    Type:   Image     Comment:   External Document

## 2010-03-05 NOTE — Miscellaneous (Signed)
Summary: Orders Update - labs  Clinical Lists Changes  Orders: Added new Test order of T-CBC w/Diff [phone]) - Signed Added new Test order of T-CD4SP [facility]) (CD4SP) - Signed Added new Test order of T-Comprehensive Metabolic Panel [phone]) - Signed Added new Test order of T-HIV Viral Load [phone]) - Signed     Process Orders Check Orders Results:     [facility]: ABN not required for this insurance Tests Sent for requisitioning (October 24, 2009 1:40 PM):     10/29/2009: [facility] -- T-CBC w/Diff [[id number]] (signed)     10/29/2009: [facility] -- T-Comprehensive Metabolic Panel [[id number]] (signed)     10/29/2009: [facility] -- T-HIV Viral Load [phone] (signed)

## 2010-03-07 NOTE — Miscellaneous (Signed)
  Clinical Lists Changes  Observations: Added new observation of HIV STATUS: HIV positive - not AIDS (01/18/2010 10:36) 

## 2010-03-21 ENCOUNTER — Telehealth: Payer: Self-pay | Admitting: Infectious Disease

## 2010-03-27 NOTE — Progress Notes (Signed)
  Phone Note Other Incoming   Summary of Call: Raquel Sarna from [place] in [place] wanted to know if we were his pcp. told her we see him for infectious disease. stated pt told her we were the pcp. told her we have seen him for other issues but it would be best if he got a family md. told her there are practices in [place] that are taking new pts. she will tell him this Initial call taken by: Golden Circle RN,  March 21, 2010 8:55 AM  Follow-up for Phone Call        I think we are his only PCP Follow-up by: Acey Lav MD,  March 21, 2010 3:48 PM

## 2010-04-22 LAB — CBC
Hemoglobin: 10.1 g/dL — ABNORMAL LOW (ref 13.0–17.0)
MCHC: 34.8 g/dL (ref 30.0–36.0)
MCV: 94.8 fL (ref 78.0–100.0)
RBC: 3.05 MIL/uL — ABNORMAL LOW (ref 4.22–5.81)
RDW: 13.4 % (ref 11.5–15.5)

## 2010-04-22 LAB — DIFFERENTIAL
Basophils Absolute: 0 10*3/uL (ref 0.0–0.1)
Basophils Relative: 0 % (ref 0–1)
Eosinophils Absolute: 0 10*3/uL (ref 0.0–0.7)
Monocytes Absolute: 1 10*3/uL (ref 0.1–1.0)
Monocytes Relative: 11 % (ref 3–12)
Neutro Abs: 6 10*3/uL (ref 1.7–7.7)
Neutrophils Relative %: 71 % (ref 43–77)

## 2010-04-22 LAB — URINALYSIS, ROUTINE W REFLEX MICROSCOPIC
Bilirubin Urine: NEGATIVE
Leukocytes, UA: NEGATIVE
Nitrite: NEGATIVE
Protein, ur: 30 mg/dL — AB

## 2010-04-22 LAB — RAPID URINE DRUG SCREEN, HOSP PERFORMED
Barbiturates: NOT DETECTED
Cocaine: NOT DETECTED

## 2010-04-22 LAB — BASIC METABOLIC PANEL
CO2: 21 mEq/L (ref 19–32)
Calcium: 10 mg/dL (ref 8.4–10.5)
Chloride: 98 mEq/L (ref 96–112)
Creatinine, Ser: 1.41 mg/dL (ref 0.4–1.5)
Glucose, Bld: 121 mg/dL — ABNORMAL HIGH (ref 70–99)

## 2010-04-22 LAB — HEPATIC FUNCTION PANEL
Albumin: 3.2 g/dL — ABNORMAL LOW (ref 3.5–5.2)
Alkaline Phosphatase: 88 U/L (ref 39–117)
Indirect Bilirubin: 0.5 mg/dL (ref 0.3–0.9)
Total Bilirubin: 1 mg/dL (ref 0.3–1.2)

## 2010-04-22 LAB — SALICYLATE LEVEL: Salicylate Lvl: <4 mg/dL (ref 2.8–20.0)

## 2010-04-22 LAB — URINE MICROSCOPIC-ADD ON

## 2010-04-22 LAB — ETHANOL: Alcohol, Ethyl (B): <5 mg/dL (ref 0–10)

## 2010-04-24 LAB — T-HELPER CELL (CD4) - (RCID CLINIC ONLY): CD4 T Cell Abs: 760 uL (ref 400–2700)

## 2010-05-20 LAB — T-HELPER CELL (CD4) - (RCID CLINIC ONLY)
CD4 % Helper T Cell: 43 % (ref 33–55)
CD4 T Cell Abs: 570 uL (ref 400–2700)

## 2010-10-29 LAB — COMPREHENSIVE METABOLIC PANEL
ALT: 186 — ABNORMAL HIGH
Alkaline Phosphatase: 137 — ABNORMAL HIGH
BUN: 14
CO2: 34 — ABNORMAL HIGH
Chloride: 95 — ABNORMAL LOW
GFR calc non Af Amer: >60
Glucose, Bld: 130 — ABNORMAL HIGH
Potassium: 4
Sodium: 137
Total Bilirubin: 0.7
Total Protein: 8

## 2010-10-29 LAB — URINALYSIS, ROUTINE W REFLEX MICROSCOPIC
Nitrite: POSITIVE — AB
Protein, ur: 30 — AB
Urobilinogen, UA: 0.2

## 2010-10-29 LAB — DIFFERENTIAL
Basophils Absolute: 0
Basophils Relative: 0
Eosinophils Absolute: 0.1
Monocytes Relative: 16 — ABNORMAL HIGH
Neutro Abs: 3.8
Neutrophils Relative %: 62

## 2010-10-29 LAB — CBC
HCT: 43.9
Hemoglobin: 15.1
RBC: 4.75
WBC: 6.1

## 2010-10-29 LAB — URINE MICROSCOPIC-ADD ON

## 2010-11-14 LAB — T-HELPER CELL (CD4) - (RCID CLINIC ONLY)
CD4 % Helper T Cell: 39
CD4 T Cell Abs: 820

## 2010-11-16 ENCOUNTER — Other Ambulatory Visit: Payer: Self-pay | Admitting: Infectious Disease

## 2010-12-04 ENCOUNTER — Encounter: Payer: Self-pay | Admitting: Infectious Disease

## 2011-01-02 ENCOUNTER — Other Ambulatory Visit: Payer: Self-pay | Admitting: Infectious Disease

## 2011-05-01 ENCOUNTER — Other Ambulatory Visit: Payer: Self-pay | Admitting: Infectious Disease

## 2015-07-17 DIAGNOSIS — M1711 Unilateral primary osteoarthritis, right knee: Secondary | ICD-10-CM | POA: Diagnosis not present

## 2015-07-17 DIAGNOSIS — M25362 Other instability, left knee: Secondary | ICD-10-CM | POA: Diagnosis not present

## 2015-07-17 DIAGNOSIS — M25561 Pain in right knee: Secondary | ICD-10-CM | POA: Diagnosis not present

## 2015-09-04 DIAGNOSIS — B182 Chronic viral hepatitis C: Secondary | ICD-10-CM | POA: Diagnosis not present

## 2015-09-04 DIAGNOSIS — Z79899 Other long term (current) drug therapy: Secondary | ICD-10-CM | POA: Diagnosis not present

## 2015-09-04 DIAGNOSIS — J449 Chronic obstructive pulmonary disease, unspecified: Secondary | ICD-10-CM | POA: Diagnosis not present

## 2015-09-04 DIAGNOSIS — Z87891 Personal history of nicotine dependence: Secondary | ICD-10-CM | POA: Diagnosis not present

## 2015-09-04 DIAGNOSIS — R809 Proteinuria, unspecified: Secondary | ICD-10-CM | POA: Diagnosis not present

## 2015-09-04 DIAGNOSIS — G8929 Other chronic pain: Secondary | ICD-10-CM | POA: Diagnosis not present

## 2015-09-04 DIAGNOSIS — M545 Low back pain: Secondary | ICD-10-CM | POA: Diagnosis not present

## 2015-09-04 DIAGNOSIS — I10 Essential (primary) hypertension: Secondary | ICD-10-CM | POA: Diagnosis not present

## 2015-09-04 DIAGNOSIS — B20 Human immunodeficiency virus [HIV] disease: Secondary | ICD-10-CM | POA: Diagnosis not present

## 2015-09-04 DIAGNOSIS — Z7982 Long term (current) use of aspirin: Secondary | ICD-10-CM | POA: Diagnosis not present

## 2015-09-14 DIAGNOSIS — E784 Other hyperlipidemia: Secondary | ICD-10-CM | POA: Diagnosis not present

## 2015-09-14 DIAGNOSIS — I10 Essential (primary) hypertension: Secondary | ICD-10-CM | POA: Diagnosis not present

## 2015-09-25 DIAGNOSIS — I129 Hypertensive chronic kidney disease with stage 1 through stage 4 chronic kidney disease, or unspecified chronic kidney disease: Secondary | ICD-10-CM | POA: Diagnosis not present

## 2015-09-25 DIAGNOSIS — N189 Chronic kidney disease, unspecified: Secondary | ICD-10-CM | POA: Diagnosis not present

## 2015-09-25 DIAGNOSIS — R801 Persistent proteinuria, unspecified: Secondary | ICD-10-CM | POA: Diagnosis not present

## 2015-09-25 DIAGNOSIS — D631 Anemia in chronic kidney disease: Secondary | ICD-10-CM | POA: Diagnosis not present

## 2015-09-25 DIAGNOSIS — B182 Chronic viral hepatitis C: Secondary | ICD-10-CM | POA: Diagnosis not present

## 2015-09-25 DIAGNOSIS — Z79899 Other long term (current) drug therapy: Secondary | ICD-10-CM | POA: Diagnosis not present

## 2015-09-25 DIAGNOSIS — B20 Human immunodeficiency virus [HIV] disease: Secondary | ICD-10-CM | POA: Diagnosis not present

## 2015-09-25 DIAGNOSIS — Z7982 Long term (current) use of aspirin: Secondary | ICD-10-CM | POA: Diagnosis not present

## 2015-09-25 DIAGNOSIS — J449 Chronic obstructive pulmonary disease, unspecified: Secondary | ICD-10-CM | POA: Diagnosis not present

## 2015-09-25 DIAGNOSIS — N182 Chronic kidney disease, stage 2 (mild): Secondary | ICD-10-CM | POA: Diagnosis not present

## 2015-09-25 DIAGNOSIS — F17210 Nicotine dependence, cigarettes, uncomplicated: Secondary | ICD-10-CM | POA: Diagnosis not present

## 2015-11-14 DIAGNOSIS — M25561 Pain in right knee: Secondary | ICD-10-CM | POA: Diagnosis not present

## 2015-11-14 DIAGNOSIS — M1711 Unilateral primary osteoarthritis, right knee: Secondary | ICD-10-CM | POA: Diagnosis not present

## 2015-11-14 DIAGNOSIS — M25362 Other instability, left knee: Secondary | ICD-10-CM | POA: Diagnosis not present

## 2015-12-14 DIAGNOSIS — Z23 Encounter for immunization: Secondary | ICD-10-CM | POA: Diagnosis not present

## 2015-12-17 DIAGNOSIS — I10 Essential (primary) hypertension: Secondary | ICD-10-CM | POA: Diagnosis not present

## 2015-12-17 DIAGNOSIS — J440 Chronic obstructive pulmonary disease with acute lower respiratory infection: Secondary | ICD-10-CM | POA: Diagnosis not present

## 2015-12-17 DIAGNOSIS — Z23 Encounter for immunization: Secondary | ICD-10-CM | POA: Diagnosis not present

## 2015-12-17 DIAGNOSIS — E784 Other hyperlipidemia: Secondary | ICD-10-CM | POA: Diagnosis not present

## 2016-01-24 DIAGNOSIS — K7460 Unspecified cirrhosis of liver: Secondary | ICD-10-CM | POA: Diagnosis not present

## 2016-01-24 DIAGNOSIS — I10 Essential (primary) hypertension: Secondary | ICD-10-CM | POA: Diagnosis not present

## 2016-01-24 DIAGNOSIS — Z87891 Personal history of nicotine dependence: Secondary | ICD-10-CM | POA: Diagnosis not present

## 2016-01-24 DIAGNOSIS — Z1211 Encounter for screening for malignant neoplasm of colon: Secondary | ICD-10-CM | POA: Diagnosis not present

## 2016-01-24 DIAGNOSIS — J449 Chronic obstructive pulmonary disease, unspecified: Secondary | ICD-10-CM | POA: Diagnosis not present

## 2016-01-24 DIAGNOSIS — B20 Human immunodeficiency virus [HIV] disease: Secondary | ICD-10-CM | POA: Diagnosis not present

## 2016-01-24 DIAGNOSIS — Z7982 Long term (current) use of aspirin: Secondary | ICD-10-CM | POA: Diagnosis not present

## 2016-01-24 DIAGNOSIS — R809 Proteinuria, unspecified: Secondary | ICD-10-CM | POA: Diagnosis not present

## 2016-01-24 DIAGNOSIS — B182 Chronic viral hepatitis C: Secondary | ICD-10-CM | POA: Diagnosis not present

## 2016-02-04 HISTORY — PX: KNEE SURGERY: SHX244

## 2016-03-06 DIAGNOSIS — B182 Chronic viral hepatitis C: Secondary | ICD-10-CM | POA: Diagnosis not present

## 2016-03-06 DIAGNOSIS — R801 Persistent proteinuria, unspecified: Secondary | ICD-10-CM | POA: Diagnosis not present

## 2016-03-06 DIAGNOSIS — B20 Human immunodeficiency virus [HIV] disease: Secondary | ICD-10-CM | POA: Diagnosis not present

## 2016-03-06 DIAGNOSIS — E781 Pure hyperglyceridemia: Secondary | ICD-10-CM | POA: Diagnosis not present

## 2016-03-06 DIAGNOSIS — N182 Chronic kidney disease, stage 2 (mild): Secondary | ICD-10-CM | POA: Diagnosis not present

## 2016-03-17 DIAGNOSIS — J440 Chronic obstructive pulmonary disease with acute lower respiratory infection: Secondary | ICD-10-CM | POA: Diagnosis not present

## 2016-03-17 DIAGNOSIS — Z0000 Encounter for general adult medical examination without abnormal findings: Secondary | ICD-10-CM | POA: Diagnosis not present

## 2016-03-17 DIAGNOSIS — N400 Enlarged prostate without lower urinary tract symptoms: Secondary | ICD-10-CM | POA: Diagnosis not present

## 2016-03-17 DIAGNOSIS — I10 Essential (primary) hypertension: Secondary | ICD-10-CM | POA: Diagnosis not present

## 2016-03-17 DIAGNOSIS — Z125 Encounter for screening for malignant neoplasm of prostate: Secondary | ICD-10-CM | POA: Diagnosis not present

## 2016-03-17 DIAGNOSIS — E784 Other hyperlipidemia: Secondary | ICD-10-CM | POA: Diagnosis not present

## 2016-03-17 DIAGNOSIS — Z131 Encounter for screening for diabetes mellitus: Secondary | ICD-10-CM | POA: Diagnosis not present

## 2016-03-19 DIAGNOSIS — M1711 Unilateral primary osteoarthritis, right knee: Secondary | ICD-10-CM | POA: Diagnosis not present

## 2016-03-19 DIAGNOSIS — M25561 Pain in right knee: Secondary | ICD-10-CM | POA: Diagnosis not present

## 2016-03-19 DIAGNOSIS — M238X1 Other internal derangements of right knee: Secondary | ICD-10-CM | POA: Diagnosis not present

## 2016-05-29 DIAGNOSIS — M1711 Unilateral primary osteoarthritis, right knee: Secondary | ICD-10-CM | POA: Diagnosis not present

## 2016-05-29 DIAGNOSIS — M25362 Other instability, left knee: Secondary | ICD-10-CM | POA: Diagnosis not present

## 2016-05-29 DIAGNOSIS — M238X1 Other internal derangements of right knee: Secondary | ICD-10-CM | POA: Diagnosis not present

## 2016-05-29 DIAGNOSIS — M25561 Pain in right knee: Secondary | ICD-10-CM | POA: Diagnosis not present

## 2016-06-18 DIAGNOSIS — Z96659 Presence of unspecified artificial knee joint: Secondary | ICD-10-CM | POA: Diagnosis not present

## 2016-06-18 DIAGNOSIS — M25562 Pain in left knee: Secondary | ICD-10-CM | POA: Diagnosis not present

## 2016-06-18 DIAGNOSIS — T84018D Broken internal joint prosthesis, other site, subsequent encounter: Secondary | ICD-10-CM | POA: Diagnosis not present

## 2016-06-23 DIAGNOSIS — N400 Enlarged prostate without lower urinary tract symptoms: Secondary | ICD-10-CM | POA: Diagnosis not present

## 2016-06-23 DIAGNOSIS — E784 Other hyperlipidemia: Secondary | ICD-10-CM | POA: Diagnosis not present

## 2016-06-23 DIAGNOSIS — I10 Essential (primary) hypertension: Secondary | ICD-10-CM | POA: Diagnosis not present

## 2016-06-23 DIAGNOSIS — J440 Chronic obstructive pulmonary disease with acute lower respiratory infection: Secondary | ICD-10-CM | POA: Diagnosis not present

## 2016-08-08 DIAGNOSIS — J440 Chronic obstructive pulmonary disease with acute lower respiratory infection: Secondary | ICD-10-CM | POA: Diagnosis not present

## 2016-08-08 DIAGNOSIS — N400 Enlarged prostate without lower urinary tract symptoms: Secondary | ICD-10-CM | POA: Diagnosis not present

## 2016-08-08 DIAGNOSIS — I10 Essential (primary) hypertension: Secondary | ICD-10-CM | POA: Diagnosis not present

## 2016-08-08 DIAGNOSIS — E784 Other hyperlipidemia: Secondary | ICD-10-CM | POA: Diagnosis not present

## 2016-09-03 DIAGNOSIS — J449 Chronic obstructive pulmonary disease, unspecified: Secondary | ICD-10-CM | POA: Diagnosis not present

## 2016-09-03 DIAGNOSIS — B20 Human immunodeficiency virus [HIV] disease: Secondary | ICD-10-CM | POA: Diagnosis not present

## 2016-09-15 DIAGNOSIS — Z22322 Carrier or suspected carrier of Methicillin resistant Staphylococcus aureus: Secondary | ICD-10-CM | POA: Diagnosis not present

## 2016-09-15 DIAGNOSIS — Z01812 Encounter for preprocedural laboratory examination: Secondary | ICD-10-CM | POA: Diagnosis not present

## 2016-09-16 DIAGNOSIS — R801 Persistent proteinuria, unspecified: Secondary | ICD-10-CM | POA: Diagnosis not present

## 2016-09-16 DIAGNOSIS — D649 Anemia, unspecified: Secondary | ICD-10-CM | POA: Diagnosis not present

## 2016-09-16 DIAGNOSIS — I129 Hypertensive chronic kidney disease with stage 1 through stage 4 chronic kidney disease, or unspecified chronic kidney disease: Secondary | ICD-10-CM | POA: Diagnosis not present

## 2016-09-16 DIAGNOSIS — N182 Chronic kidney disease, stage 2 (mild): Secondary | ICD-10-CM | POA: Diagnosis not present

## 2016-09-16 DIAGNOSIS — E875 Hyperkalemia: Secondary | ICD-10-CM | POA: Diagnosis not present

## 2016-09-16 DIAGNOSIS — B20 Human immunodeficiency virus [HIV] disease: Secondary | ICD-10-CM | POA: Diagnosis not present

## 2016-09-16 DIAGNOSIS — Z79899 Other long term (current) drug therapy: Secondary | ICD-10-CM | POA: Diagnosis not present

## 2016-09-16 DIAGNOSIS — I10 Essential (primary) hypertension: Secondary | ICD-10-CM | POA: Diagnosis not present

## 2016-09-16 DIAGNOSIS — R350 Frequency of micturition: Secondary | ICD-10-CM | POA: Diagnosis not present

## 2016-09-16 DIAGNOSIS — Z7982 Long term (current) use of aspirin: Secondary | ICD-10-CM | POA: Diagnosis not present

## 2016-09-16 DIAGNOSIS — Z87891 Personal history of nicotine dependence: Secondary | ICD-10-CM | POA: Diagnosis not present

## 2016-09-16 DIAGNOSIS — J449 Chronic obstructive pulmonary disease, unspecified: Secondary | ICD-10-CM | POA: Diagnosis not present

## 2016-09-23 DIAGNOSIS — J440 Chronic obstructive pulmonary disease with acute lower respiratory infection: Secondary | ICD-10-CM | POA: Diagnosis not present

## 2016-09-23 DIAGNOSIS — N400 Enlarged prostate without lower urinary tract symptoms: Secondary | ICD-10-CM | POA: Diagnosis not present

## 2016-09-23 DIAGNOSIS — E784 Other hyperlipidemia: Secondary | ICD-10-CM | POA: Diagnosis not present

## 2016-09-23 DIAGNOSIS — I10 Essential (primary) hypertension: Secondary | ICD-10-CM | POA: Diagnosis not present

## 2016-09-30 DIAGNOSIS — Z01818 Encounter for other preprocedural examination: Secondary | ICD-10-CM | POA: Diagnosis not present

## 2016-10-01 DIAGNOSIS — M1711 Unilateral primary osteoarthritis, right knee: Secondary | ICD-10-CM | POA: Diagnosis not present

## 2016-10-01 DIAGNOSIS — M25561 Pain in right knee: Secondary | ICD-10-CM | POA: Diagnosis not present

## 2016-10-01 DIAGNOSIS — M238X2 Other internal derangements of left knee: Secondary | ICD-10-CM | POA: Diagnosis not present

## 2016-10-07 DIAGNOSIS — N400 Enlarged prostate without lower urinary tract symptoms: Secondary | ICD-10-CM | POA: Diagnosis not present

## 2016-10-07 DIAGNOSIS — M25562 Pain in left knee: Secondary | ICD-10-CM | POA: Diagnosis not present

## 2016-10-07 DIAGNOSIS — J449 Chronic obstructive pulmonary disease, unspecified: Secondary | ICD-10-CM | POA: Diagnosis not present

## 2016-10-07 DIAGNOSIS — K219 Gastro-esophageal reflux disease without esophagitis: Secondary | ICD-10-CM | POA: Diagnosis not present

## 2016-10-07 DIAGNOSIS — Z21 Asymptomatic human immunodeficiency virus [HIV] infection status: Secondary | ICD-10-CM | POA: Diagnosis not present

## 2016-10-07 DIAGNOSIS — I10 Essential (primary) hypertension: Secondary | ICD-10-CM | POA: Diagnosis not present

## 2016-10-07 DIAGNOSIS — T84093A Other mechanical complication of internal left knee prosthesis, initial encounter: Secondary | ICD-10-CM | POA: Diagnosis not present

## 2016-10-07 DIAGNOSIS — J441 Chronic obstructive pulmonary disease with (acute) exacerbation: Secondary | ICD-10-CM | POA: Diagnosis not present

## 2016-10-07 DIAGNOSIS — Z7982 Long term (current) use of aspirin: Secondary | ICD-10-CM | POA: Diagnosis not present

## 2016-10-07 DIAGNOSIS — T84023A Instability of internal left knee prosthesis, initial encounter: Secondary | ICD-10-CM | POA: Diagnosis not present

## 2016-10-07 DIAGNOSIS — F17200 Nicotine dependence, unspecified, uncomplicated: Secondary | ICD-10-CM | POA: Diagnosis not present

## 2016-10-07 DIAGNOSIS — Z79899 Other long term (current) drug therapy: Secondary | ICD-10-CM | POA: Diagnosis not present

## 2016-10-07 DIAGNOSIS — B1920 Unspecified viral hepatitis C without hepatic coma: Secondary | ICD-10-CM | POA: Diagnosis not present

## 2016-10-07 DIAGNOSIS — Z885 Allergy status to narcotic agent status: Secondary | ICD-10-CM | POA: Diagnosis not present

## 2016-10-08 DIAGNOSIS — J449 Chronic obstructive pulmonary disease, unspecified: Secondary | ICD-10-CM | POA: Diagnosis present

## 2016-10-08 DIAGNOSIS — T84023A Instability of internal left knee prosthesis, initial encounter: Secondary | ICD-10-CM | POA: Diagnosis present

## 2016-10-08 DIAGNOSIS — K219 Gastro-esophageal reflux disease without esophagitis: Secondary | ICD-10-CM | POA: Diagnosis present

## 2016-10-08 DIAGNOSIS — Z7982 Long term (current) use of aspirin: Secondary | ICD-10-CM | POA: Diagnosis not present

## 2016-10-08 DIAGNOSIS — Z885 Allergy status to narcotic agent status: Secondary | ICD-10-CM | POA: Diagnosis not present

## 2016-10-08 DIAGNOSIS — J441 Chronic obstructive pulmonary disease with (acute) exacerbation: Secondary | ICD-10-CM | POA: Diagnosis not present

## 2016-10-08 DIAGNOSIS — I10 Essential (primary) hypertension: Secondary | ICD-10-CM | POA: Diagnosis present

## 2016-10-08 DIAGNOSIS — Z79899 Other long term (current) drug therapy: Secondary | ICD-10-CM | POA: Diagnosis not present

## 2016-10-08 DIAGNOSIS — N400 Enlarged prostate without lower urinary tract symptoms: Secondary | ICD-10-CM | POA: Diagnosis present

## 2016-10-08 DIAGNOSIS — F17200 Nicotine dependence, unspecified, uncomplicated: Secondary | ICD-10-CM | POA: Diagnosis present

## 2016-10-08 DIAGNOSIS — B1920 Unspecified viral hepatitis C without hepatic coma: Secondary | ICD-10-CM | POA: Diagnosis present

## 2016-10-08 DIAGNOSIS — Z21 Asymptomatic human immunodeficiency virus [HIV] infection status: Secondary | ICD-10-CM | POA: Diagnosis present

## 2016-10-11 DIAGNOSIS — R2689 Other abnormalities of gait and mobility: Secondary | ICD-10-CM | POA: Diagnosis not present

## 2016-10-11 DIAGNOSIS — I10 Essential (primary) hypertension: Secondary | ICD-10-CM | POA: Diagnosis not present

## 2016-10-11 DIAGNOSIS — Z21 Asymptomatic human immunodeficiency virus [HIV] infection status: Secondary | ICD-10-CM | POA: Diagnosis not present

## 2016-10-11 DIAGNOSIS — B182 Chronic viral hepatitis C: Secondary | ICD-10-CM | POA: Diagnosis not present

## 2016-10-11 DIAGNOSIS — Z4733 Aftercare following explantation of knee joint prosthesis: Secondary | ICD-10-CM | POA: Diagnosis not present

## 2016-10-11 DIAGNOSIS — Z96652 Presence of left artificial knee joint: Secondary | ICD-10-CM | POA: Diagnosis not present

## 2016-10-13 DIAGNOSIS — Z4733 Aftercare following explantation of knee joint prosthesis: Secondary | ICD-10-CM | POA: Diagnosis not present

## 2016-10-13 DIAGNOSIS — B182 Chronic viral hepatitis C: Secondary | ICD-10-CM | POA: Diagnosis not present

## 2016-10-13 DIAGNOSIS — Z21 Asymptomatic human immunodeficiency virus [HIV] infection status: Secondary | ICD-10-CM | POA: Diagnosis not present

## 2016-10-13 DIAGNOSIS — Z96652 Presence of left artificial knee joint: Secondary | ICD-10-CM | POA: Diagnosis not present

## 2016-10-13 DIAGNOSIS — I10 Essential (primary) hypertension: Secondary | ICD-10-CM | POA: Diagnosis not present

## 2016-10-13 DIAGNOSIS — R2689 Other abnormalities of gait and mobility: Secondary | ICD-10-CM | POA: Diagnosis not present

## 2016-10-15 DIAGNOSIS — Z96652 Presence of left artificial knee joint: Secondary | ICD-10-CM | POA: Diagnosis not present

## 2016-10-15 DIAGNOSIS — I10 Essential (primary) hypertension: Secondary | ICD-10-CM | POA: Diagnosis not present

## 2016-10-15 DIAGNOSIS — Z21 Asymptomatic human immunodeficiency virus [HIV] infection status: Secondary | ICD-10-CM | POA: Diagnosis not present

## 2016-10-15 DIAGNOSIS — Z4733 Aftercare following explantation of knee joint prosthesis: Secondary | ICD-10-CM | POA: Diagnosis not present

## 2016-10-15 DIAGNOSIS — R2689 Other abnormalities of gait and mobility: Secondary | ICD-10-CM | POA: Diagnosis not present

## 2016-10-15 DIAGNOSIS — B182 Chronic viral hepatitis C: Secondary | ICD-10-CM | POA: Diagnosis not present

## 2016-10-16 DIAGNOSIS — Z21 Asymptomatic human immunodeficiency virus [HIV] infection status: Secondary | ICD-10-CM | POA: Diagnosis not present

## 2016-10-16 DIAGNOSIS — Z96652 Presence of left artificial knee joint: Secondary | ICD-10-CM | POA: Diagnosis not present

## 2016-10-16 DIAGNOSIS — B182 Chronic viral hepatitis C: Secondary | ICD-10-CM | POA: Diagnosis not present

## 2016-10-16 DIAGNOSIS — Z4733 Aftercare following explantation of knee joint prosthesis: Secondary | ICD-10-CM | POA: Diagnosis not present

## 2016-10-16 DIAGNOSIS — R2689 Other abnormalities of gait and mobility: Secondary | ICD-10-CM | POA: Diagnosis not present

## 2016-10-16 DIAGNOSIS — I10 Essential (primary) hypertension: Secondary | ICD-10-CM | POA: Diagnosis not present

## 2016-10-18 DIAGNOSIS — M79662 Pain in left lower leg: Secondary | ICD-10-CM | POA: Diagnosis not present

## 2016-10-18 DIAGNOSIS — B977 Papillomavirus as the cause of diseases classified elsewhere: Secondary | ICD-10-CM | POA: Diagnosis not present

## 2016-10-18 DIAGNOSIS — I10 Essential (primary) hypertension: Secondary | ICD-10-CM | POA: Diagnosis present

## 2016-10-18 DIAGNOSIS — B009 Herpesviral infection, unspecified: Secondary | ICD-10-CM | POA: Diagnosis not present

## 2016-10-18 DIAGNOSIS — T814XXA Infection following a procedure, initial encounter: Secondary | ICD-10-CM | POA: Diagnosis present

## 2016-10-18 DIAGNOSIS — N400 Enlarged prostate without lower urinary tract symptoms: Secondary | ICD-10-CM | POA: Diagnosis present

## 2016-10-18 DIAGNOSIS — M109 Gout, unspecified: Secondary | ICD-10-CM | POA: Diagnosis present

## 2016-10-18 DIAGNOSIS — A630 Anogenital (venereal) warts: Secondary | ICD-10-CM | POA: Diagnosis present

## 2016-10-18 DIAGNOSIS — R609 Edema, unspecified: Secondary | ICD-10-CM | POA: Diagnosis not present

## 2016-10-18 DIAGNOSIS — M7989 Other specified soft tissue disorders: Secondary | ICD-10-CM | POA: Diagnosis not present

## 2016-10-18 DIAGNOSIS — Z717 Human immunodeficiency virus [HIV] counseling: Secondary | ICD-10-CM | POA: Diagnosis not present

## 2016-10-18 DIAGNOSIS — Z79899 Other long term (current) drug therapy: Secondary | ICD-10-CM | POA: Diagnosis not present

## 2016-10-18 DIAGNOSIS — L03116 Cellulitis of left lower limb: Secondary | ICD-10-CM | POA: Diagnosis present

## 2016-10-18 DIAGNOSIS — Z8249 Family history of ischemic heart disease and other diseases of the circulatory system: Secondary | ICD-10-CM | POA: Diagnosis not present

## 2016-10-18 DIAGNOSIS — K7460 Unspecified cirrhosis of liver: Secondary | ICD-10-CM | POA: Diagnosis not present

## 2016-10-18 DIAGNOSIS — B1920 Unspecified viral hepatitis C without hepatic coma: Secondary | ICD-10-CM | POA: Diagnosis present

## 2016-10-18 DIAGNOSIS — B20 Human immunodeficiency virus [HIV] disease: Secondary | ICD-10-CM | POA: Diagnosis not present

## 2016-10-18 DIAGNOSIS — M6281 Muscle weakness (generalized): Secondary | ICD-10-CM | POA: Diagnosis not present

## 2016-10-18 DIAGNOSIS — Z886 Allergy status to analgesic agent status: Secondary | ICD-10-CM | POA: Diagnosis not present

## 2016-10-18 DIAGNOSIS — F17200 Nicotine dependence, unspecified, uncomplicated: Secondary | ICD-10-CM | POA: Diagnosis present

## 2016-10-18 DIAGNOSIS — Z96652 Presence of left artificial knee joint: Secondary | ICD-10-CM | POA: Diagnosis not present

## 2016-10-18 DIAGNOSIS — E785 Hyperlipidemia, unspecified: Secondary | ICD-10-CM | POA: Diagnosis present

## 2016-10-18 DIAGNOSIS — Z21 Asymptomatic human immunodeficiency virus [HIV] infection status: Secondary | ICD-10-CM | POA: Diagnosis not present

## 2016-10-18 DIAGNOSIS — M79661 Pain in right lower leg: Secondary | ICD-10-CM | POA: Diagnosis not present

## 2016-10-18 DIAGNOSIS — Y838 Other surgical procedures as the cause of abnormal reaction of the patient, or of later complication, without mention of misadventure at the time of the procedure: Secondary | ICD-10-CM | POA: Diagnosis not present

## 2016-10-18 DIAGNOSIS — R9431 Abnormal electrocardiogram [ECG] [EKG]: Secondary | ICD-10-CM | POA: Diagnosis not present

## 2016-10-18 DIAGNOSIS — Z4789 Encounter for other orthopedic aftercare: Secondary | ICD-10-CM | POA: Diagnosis not present

## 2016-10-18 DIAGNOSIS — K219 Gastro-esophageal reflux disease without esophagitis: Secondary | ICD-10-CM | POA: Diagnosis present

## 2016-10-18 DIAGNOSIS — E875 Hyperkalemia: Secondary | ICD-10-CM | POA: Diagnosis not present

## 2016-10-18 DIAGNOSIS — Z7982 Long term (current) use of aspirin: Secondary | ICD-10-CM | POA: Diagnosis not present

## 2016-10-18 DIAGNOSIS — J449 Chronic obstructive pulmonary disease, unspecified: Secondary | ICD-10-CM | POA: Diagnosis present

## 2016-10-18 DIAGNOSIS — R262 Difficulty in walking, not elsewhere classified: Secondary | ICD-10-CM | POA: Diagnosis not present

## 2016-10-18 DIAGNOSIS — D638 Anemia in other chronic diseases classified elsewhere: Secondary | ICD-10-CM | POA: Diagnosis present

## 2016-10-18 DIAGNOSIS — D649 Anemia, unspecified: Secondary | ICD-10-CM | POA: Diagnosis not present

## 2016-10-18 DIAGNOSIS — R0602 Shortness of breath: Secondary | ICD-10-CM | POA: Diagnosis not present

## 2016-10-18 DIAGNOSIS — Z7951 Long term (current) use of inhaled steroids: Secondary | ICD-10-CM | POA: Diagnosis not present

## 2016-10-22 DIAGNOSIS — M2550 Pain in unspecified joint: Secondary | ICD-10-CM | POA: Diagnosis not present

## 2016-10-22 DIAGNOSIS — Z886 Allergy status to analgesic agent status: Secondary | ICD-10-CM | POA: Diagnosis not present

## 2016-10-22 DIAGNOSIS — F17200 Nicotine dependence, unspecified, uncomplicated: Secondary | ICD-10-CM | POA: Diagnosis not present

## 2016-10-22 DIAGNOSIS — Z21 Asymptomatic human immunodeficiency virus [HIV] infection status: Secondary | ICD-10-CM | POA: Diagnosis not present

## 2016-10-22 DIAGNOSIS — Z79899 Other long term (current) drug therapy: Secondary | ICD-10-CM | POA: Diagnosis not present

## 2016-10-22 DIAGNOSIS — L03116 Cellulitis of left lower limb: Secondary | ICD-10-CM | POA: Diagnosis not present

## 2016-10-22 DIAGNOSIS — K7460 Unspecified cirrhosis of liver: Secondary | ICD-10-CM | POA: Diagnosis not present

## 2016-10-22 DIAGNOSIS — R262 Difficulty in walking, not elsewhere classified: Secondary | ICD-10-CM | POA: Diagnosis not present

## 2016-10-22 DIAGNOSIS — R188 Other ascites: Secondary | ICD-10-CM | POA: Diagnosis not present

## 2016-10-22 DIAGNOSIS — K824 Cholesterolosis of gallbladder: Secondary | ICD-10-CM | POA: Diagnosis not present

## 2016-10-22 DIAGNOSIS — B977 Papillomavirus as the cause of diseases classified elsewhere: Secondary | ICD-10-CM | POA: Diagnosis not present

## 2016-10-22 DIAGNOSIS — I10 Essential (primary) hypertension: Secondary | ICD-10-CM | POA: Diagnosis not present

## 2016-10-22 DIAGNOSIS — T8141XA Infection following a procedure, superficial incisional surgical site, initial encounter: Secondary | ICD-10-CM | POA: Diagnosis not present

## 2016-10-22 DIAGNOSIS — D638 Anemia in other chronic diseases classified elsewhere: Secondary | ICD-10-CM | POA: Diagnosis not present

## 2016-10-22 DIAGNOSIS — M109 Gout, unspecified: Secondary | ICD-10-CM | POA: Diagnosis not present

## 2016-10-22 DIAGNOSIS — B199 Unspecified viral hepatitis without hepatic coma: Secondary | ICD-10-CM | POA: Diagnosis not present

## 2016-10-22 DIAGNOSIS — B20 Human immunodeficiency virus [HIV] disease: Secondary | ICD-10-CM | POA: Diagnosis not present

## 2016-10-22 DIAGNOSIS — B182 Chronic viral hepatitis C: Secondary | ICD-10-CM | POA: Diagnosis not present

## 2016-10-22 DIAGNOSIS — D649 Anemia, unspecified: Secondary | ICD-10-CM | POA: Diagnosis not present

## 2016-10-22 DIAGNOSIS — T814XXA Infection following a procedure, initial encounter: Secondary | ICD-10-CM | POA: Diagnosis not present

## 2016-10-22 DIAGNOSIS — M6281 Muscle weakness (generalized): Secondary | ICD-10-CM | POA: Diagnosis not present

## 2016-10-22 DIAGNOSIS — K219 Gastro-esophageal reflux disease without esophagitis: Secondary | ICD-10-CM | POA: Diagnosis not present

## 2016-10-22 DIAGNOSIS — Z7982 Long term (current) use of aspirin: Secondary | ICD-10-CM | POA: Diagnosis not present

## 2016-10-22 DIAGNOSIS — Z96652 Presence of left artificial knee joint: Secondary | ICD-10-CM | POA: Diagnosis not present

## 2016-10-22 DIAGNOSIS — Z8249 Family history of ischemic heart disease and other diseases of the circulatory system: Secondary | ICD-10-CM | POA: Diagnosis not present

## 2016-10-22 DIAGNOSIS — M25562 Pain in left knee: Secondary | ICD-10-CM | POA: Diagnosis not present

## 2016-10-22 DIAGNOSIS — Z23 Encounter for immunization: Secondary | ICD-10-CM | POA: Diagnosis not present

## 2016-10-22 DIAGNOSIS — Z717 Human immunodeficiency virus [HIV] counseling: Secondary | ICD-10-CM | POA: Diagnosis not present

## 2016-10-22 DIAGNOSIS — L0889 Other specified local infections of the skin and subcutaneous tissue: Secondary | ICD-10-CM | POA: Diagnosis not present

## 2016-10-22 DIAGNOSIS — T8453XA Infection and inflammatory reaction due to internal right knee prosthesis, initial encounter: Secondary | ICD-10-CM | POA: Diagnosis not present

## 2016-10-22 DIAGNOSIS — B009 Herpesviral infection, unspecified: Secondary | ICD-10-CM | POA: Diagnosis not present

## 2016-10-22 DIAGNOSIS — Y838 Other surgical procedures as the cause of abnormal reaction of the patient, or of later complication, without mention of misadventure at the time of the procedure: Secondary | ICD-10-CM | POA: Diagnosis not present

## 2016-10-22 DIAGNOSIS — J449 Chronic obstructive pulmonary disease, unspecified: Secondary | ICD-10-CM | POA: Diagnosis not present

## 2016-10-22 DIAGNOSIS — B1920 Unspecified viral hepatitis C without hepatic coma: Secondary | ICD-10-CM | POA: Diagnosis not present

## 2016-10-22 DIAGNOSIS — K5900 Constipation, unspecified: Secondary | ICD-10-CM | POA: Diagnosis not present

## 2016-10-22 DIAGNOSIS — E785 Hyperlipidemia, unspecified: Secondary | ICD-10-CM | POA: Diagnosis not present

## 2016-10-22 DIAGNOSIS — Z4789 Encounter for other orthopedic aftercare: Secondary | ICD-10-CM | POA: Diagnosis not present

## 2016-10-22 DIAGNOSIS — T8149XA Infection following a procedure, other surgical site, initial encounter: Secondary | ICD-10-CM | POA: Diagnosis not present

## 2016-10-22 DIAGNOSIS — N400 Enlarged prostate without lower urinary tract symptoms: Secondary | ICD-10-CM | POA: Diagnosis not present

## 2016-10-22 DIAGNOSIS — E875 Hyperkalemia: Secondary | ICD-10-CM | POA: Diagnosis not present

## 2016-10-22 DIAGNOSIS — M1712 Unilateral primary osteoarthritis, left knee: Secondary | ICD-10-CM | POA: Diagnosis not present

## 2016-10-22 DIAGNOSIS — M009 Pyogenic arthritis, unspecified: Secondary | ICD-10-CM | POA: Diagnosis not present

## 2016-10-22 DIAGNOSIS — B1710 Acute hepatitis C without hepatic coma: Secondary | ICD-10-CM | POA: Diagnosis not present

## 2016-10-23 DIAGNOSIS — E785 Hyperlipidemia, unspecified: Secondary | ICD-10-CM | POA: Diagnosis not present

## 2016-10-23 DIAGNOSIS — J449 Chronic obstructive pulmonary disease, unspecified: Secondary | ICD-10-CM | POA: Diagnosis not present

## 2016-10-23 DIAGNOSIS — B20 Human immunodeficiency virus [HIV] disease: Secondary | ICD-10-CM | POA: Diagnosis not present

## 2016-10-23 DIAGNOSIS — I10 Essential (primary) hypertension: Secondary | ICD-10-CM | POA: Diagnosis not present

## 2016-10-27 DIAGNOSIS — J449 Chronic obstructive pulmonary disease, unspecified: Secondary | ICD-10-CM | POA: Diagnosis not present

## 2016-10-27 DIAGNOSIS — M2550 Pain in unspecified joint: Secondary | ICD-10-CM | POA: Diagnosis not present

## 2016-10-28 DIAGNOSIS — M1712 Unilateral primary osteoarthritis, left knee: Secondary | ICD-10-CM | POA: Diagnosis not present

## 2016-10-29 ENCOUNTER — Encounter: Payer: Self-pay | Admitting: Nurse Practitioner

## 2016-10-29 ENCOUNTER — Ambulatory Visit (INDEPENDENT_AMBULATORY_CARE_PROVIDER_SITE_OTHER): Payer: Medicare Other | Admitting: Nurse Practitioner

## 2016-10-29 VITALS — BP 133/76 | HR 84 | Temp 98.2°F | Ht 69.0 in | Wt 177.2 lb

## 2016-10-29 DIAGNOSIS — K7460 Unspecified cirrhosis of liver: Secondary | ICD-10-CM | POA: Diagnosis not present

## 2016-10-29 DIAGNOSIS — B1710 Acute hepatitis C without hepatic coma: Secondary | ICD-10-CM

## 2016-10-29 DIAGNOSIS — B182 Chronic viral hepatitis C: Secondary | ICD-10-CM | POA: Diagnosis not present

## 2016-10-29 DIAGNOSIS — K5900 Constipation, unspecified: Secondary | ICD-10-CM

## 2016-10-29 MED ORDER — POLYETHYLENE GLYCOL 3350 17 GM/SCOOP PO POWD: ORAL | 3 refills | Status: DC

## 2016-10-29 MED ORDER — DOCUSATE SODIUM 100 MG PO CAPS: 100.0000 mg | ORAL_CAPSULE | Freq: Two times a day (BID) | ORAL | 1 refills | Status: DC

## 2016-10-29 NOTE — Patient Instructions (Signed)
1. We will help schedule your abdominal ultrasound for you. 2. For constipation take Colace stool softener over-the-counter, 100 mg pills, 1 pill twice a day, every day. 3. If you do not have a bowel movement in 2 or more days despite the stool softener, use MiraLAX powder 17 g mixed and will beverage of your choice 1-2 times a day and to you have a bowel movement. 4. Follow-up at [facility] who was your years long, regular liver provider. 5. Return for follow-up as needed.

## 2016-10-29 NOTE — Progress Notes (Signed)
Primary Care Physician:  Neale Burly, MD Primary Gastroenterologist:  Dr. Oneida Alar  Chief Complaint  Patient presents with  . Ascites    HPI:   [facility] is a 66 y.o. male who presents On referral from primary care for "ascites." The patient is currently at [facility] for rehabilitation. Note reviewed. The patient had labs on 10/04/2016 with low hemoglobin at 13.7, otherwise essentially normal. Basic metabolic profile essentially normal. PCP/facility provider notes reviewed. Last saw this provider 10/23/2016 which indicates admission to self-care status post hospitalization for revision of left TKR. History of HIV currently on medications. Due to be clinically stable, no leg pain, responding well to pain medicines, BP is under control, hemodynamically stable. Recommended continue current medication regimen and monitor.  His facility she indicates a diagnosis of "unspecified cirrhosis of the liver."  No notes or imaging to suggest when, where, why patient has a diagnosis of "ascites." Reviewed imaging reports in our system with last ultrasound of the abdomen completed 11/22/2007 which indicated no acute findings and unremarkable liver. Unsure of where diagnosis of "other liver cirrhosis" was derived.  Today he states he has known he has cirrhosis for years. Is seen by [facility] for cirrhosis. Review of 2017 ID note indicates HIV treatment and HCV co-infection status. Indicate cirrhosis based on ultrasound. Previously underwent HCV 1996 likely standard interferon. Treatment failed. He declined HCV treatment.  Today she states he has constipation, consistent with Bristol 1, not on any constipation medications. Has a history of chronic constipation. Denies abdominal pain, N/V, hematochezia, melena, fever, chills, unintentional weight loss. Denies chest pain, dyspnea, dizziness, lightheadedness, syncope, near syncope. Denies any other upper or lower GI symptoms.  Past Medical  History:  Diagnosis Date  . Anemia, unspecified   . BPH (benign prostatic hyperplasia)   . Cellulitis of left foot   . Chronic hepatitis C virus infection with cirrhosis ([place])   . COPD (chronic obstructive pulmonary disease) ([place])   . GERD (gastroesophageal reflux disease)   . HIV (human immunodeficiency virus infection) ([place])   . Hyperlipidemia   . Hypertension   . Infectious human wart virus   . Presence of left artificial knee joint     No past surgical history on file.  Current Outpatient Prescriptions  Medication Sig Dispense Refill  . albuterol (PROVENTIL HFA;VENTOLIN HFA) 108 (90 Base) MCG/ACT inhaler Inhale 2 puffs into the lungs 2 (two) times daily.    Marland Kitchen amLODipine-atorvastatin (CADUET) 5-10 MG tablet Take 1 tablet by mouth daily.    Marland Kitchen aspirin 325 MG tablet Take 325 mg by mouth daily.    . celecoxib (CELEBREX) 200 MG capsule Take 200 mg by mouth 2 (two) times daily.    Marland Kitchen emtricitabine-tenofovir AF (DESCOVY) 200-25 MG tablet Take 1 tablet by mouth daily.    . hydrochlorothiazide (HYDRODIURIL) 25 MG tablet Take 25 mg by mouth daily.    Marland Kitchen HYDROcodone-acetaminophen (NORCO/VICODIN) 5-325 MG tablet Take 1 tablet by mouth every 4 (four) hours as needed for moderate pain.    . Magnesium 400 MG TABS Take 1 tablet by mouth daily.    . Omega-3 Fatty Acids (FISH OIL) 1000 MG CAPS Take 1 capsule by mouth 3 (three) times daily.    Marland Kitchen QVAR 80 MCG/ACT inhaler INHALE 2 PUFFS INTO MOUTH TWICE DAILY 8.7 g 3  . raltegravir (ISENTRESS) 400 MG tablet Take 400 mg by mouth 2 (two) times daily.    . ranitidine (ZANTAC) 150 MG tablet Take 150 mg  by mouth daily.    Marland Kitchen TAMSULOSIN HCL PO Take by mouth daily. Tamsulosin 0.8mg  by mouth one time a day    . docusate sodium (COLACE) 100 MG capsule Take 1 capsule (100 mg total) by mouth 2 (two) times daily. 60 capsule 1  . polyethylene glycol powder (GLYCOLAX/MIRALAX) powder USE 17 gm one to two times daily as needed for no bowel movement in 2+ days 255 g 3  .  VENTOLIN HFA 108 (90 BASE) MCG/ACT inhaler INHALE 2 PUFFS INTO MOUTH EVERY 6 HOURS AS NEEDED FOR COUGH (Patient not taking: Reported on 10/29/2016) 1 Inhaler 6   No current facility-administered medications for this visit.     Allergies as of 10/29/2016 - never reviewed  Allergen Reaction Noted  . Other  10/29/2016    No family history on file.  Social History   Social History  . Marital status: Single    Spouse name: N/A  . Number of children: N/A  . Years of education: N/A   Occupational History  . Not on file.   Social History Main Topics  . Smoking status: Former Research scientist (life sciences)  . Smokeless tobacco: Never Used  . Alcohol use No  . Drug use: Yes    Types: Marijuana  . Sexual activity: Not on file   Other Topics Concern  . Not on file   Social History Narrative  . No narrative on file    Review of Systems: Complete ROS negative except as per HPI.    Physical Exam: BP 133/76   Pulse 84   Temp 98.2 F (36.8 C) (Oral)   Ht 5\' 9"  (1.753 m)   Wt 177 lb 3.2 oz (80.4 kg)   BMI 26.17 kg/m  General:   Alert and oriented. Pleasant and cooperative. Well-nourished and well-developed.  Head:  Normocephalic and atraumatic. Eyes:  Without icterus, sclera clear and conjunctiva pink.  Ears:  Normal auditory acuity. Cardiovascular:  S1, S2 present without murmurs appreciated. Extremities without clubbing or edema. Respiratory:  Clear to auscultation bilaterally. No wheezes, rales, or rhonchi. No distress.  Gastrointestinal:  +BS, firm, non-tender and somewhat distended. No HSM noted. No guarding or rebound. No masses appreciated.  Rectal:  Deferred  Musculoskalatal:  Symmetrical without gross deformities. Neurologic:  Alert and oriented x4;  grossly normal neurologically. Psych:  Alert and cooperative. Normal mood and affect. Heme/Lymph/Immune: No excessive bruising noted.    11/05/2016 12:56 PM   Disclaimer: This note was dictated with voice recognition software. Similar  sounding words can inadvertently be transcribed and may not be corrected upon review.

## 2016-11-03 DIAGNOSIS — I10 Essential (primary) hypertension: Secondary | ICD-10-CM | POA: Diagnosis not present

## 2016-11-03 DIAGNOSIS — M2550 Pain in unspecified joint: Secondary | ICD-10-CM | POA: Diagnosis not present

## 2016-11-03 DIAGNOSIS — J449 Chronic obstructive pulmonary disease, unspecified: Secondary | ICD-10-CM | POA: Diagnosis not present

## 2016-11-04 DIAGNOSIS — T8149XA Infection following a procedure, other surgical site, initial encounter: Secondary | ICD-10-CM | POA: Diagnosis not present

## 2016-11-05 ENCOUNTER — Encounter: Payer: Self-pay | Admitting: Nurse Practitioner

## 2016-11-05 ENCOUNTER — Ambulatory Visit (HOSPITAL_COMMUNITY)
Admission: RE | Admit: 2016-11-05 | Discharge: 2016-11-05 | Disposition: A | Discharge status: Home / Self Care | Payer: No Typology Code available for payment source | Source: Ambulatory Visit | Attending: Nurse Practitioner | Admitting: Nurse Practitioner

## 2016-11-05 DIAGNOSIS — R188 Other ascites: Secondary | ICD-10-CM | POA: Insufficient documentation

## 2016-11-05 DIAGNOSIS — B1710 Acute hepatitis C without hepatic coma: Secondary | ICD-10-CM | POA: Diagnosis not present

## 2016-11-05 DIAGNOSIS — K7460 Unspecified cirrhosis of liver: Secondary | ICD-10-CM | POA: Diagnosis not present

## 2016-11-05 DIAGNOSIS — B182 Chronic viral hepatitis C: Secondary | ICD-10-CM | POA: Insufficient documentation

## 2016-11-05 DIAGNOSIS — K824 Cholesterolosis of gallbladder: Secondary | ICD-10-CM | POA: Insufficient documentation

## 2016-11-05 DIAGNOSIS — K5900 Constipation, unspecified: Secondary | ICD-10-CM | POA: Insufficient documentation

## 2016-11-05 NOTE — Assessment & Plan Note (Signed)
The patient's major complaint today is constipation consistent with Bristol 1 and not on any constipation medications. He has a history of chronic constipation. No other GI symptoms at this time. Recommend Colace stool softener 100 mg twice a day, every day. Can use MiraLAX 17 g 1-2 times a day as needed for no bowel movement in 2 days or persistent hard stools. Return for follow-up as needed.

## 2016-11-05 NOTE — Assessment & Plan Note (Signed)
The patient has a history of HepC cirrhosis with HIV coinfection and sees providers at [facility] for this. Previous failed treatment of HCV on interferon and is declining further DAA treatment. He denies significant abdominal symptoms (other than constipation, as per below). He was referred to [place] by nursing home facility for "ascites" and his abdominal exam does ot reveal findings consistent with significant ascites. We will proceed with abdominal U/S to evaluate for any ascites. Recommend follow-up with established providers.

## 2016-11-05 NOTE — Progress Notes (Signed)
cc'ed to pcp °

## 2016-11-05 NOTE — Assessment & Plan Note (Signed)
HCV infection with coinfectioin with HIV. Previously failed interferon treatment, sees ID at [facility] and is declining DAA treatment. Cotninue care with established providers. Follow-up as needed.

## 2016-11-06 DIAGNOSIS — I10 Essential (primary) hypertension: Secondary | ICD-10-CM | POA: Diagnosis not present

## 2016-11-06 DIAGNOSIS — Z21 Asymptomatic human immunodeficiency virus [HIV] infection status: Secondary | ICD-10-CM | POA: Diagnosis not present

## 2016-11-06 DIAGNOSIS — L0889 Other specified local infections of the skin and subcutaneous tissue: Secondary | ICD-10-CM | POA: Diagnosis not present

## 2016-11-06 DIAGNOSIS — J449 Chronic obstructive pulmonary disease, unspecified: Secondary | ICD-10-CM | POA: Diagnosis not present

## 2016-11-06 DIAGNOSIS — T8141XA Infection following a procedure, superficial incisional surgical site, initial encounter: Secondary | ICD-10-CM | POA: Diagnosis not present

## 2016-11-06 DIAGNOSIS — B1920 Unspecified viral hepatitis C without hepatic coma: Secondary | ICD-10-CM | POA: Diagnosis not present

## 2016-11-07 DIAGNOSIS — B1920 Unspecified viral hepatitis C without hepatic coma: Secondary | ICD-10-CM | POA: Diagnosis not present

## 2016-11-07 DIAGNOSIS — I10 Essential (primary) hypertension: Secondary | ICD-10-CM | POA: Diagnosis not present

## 2016-11-07 DIAGNOSIS — J449 Chronic obstructive pulmonary disease, unspecified: Secondary | ICD-10-CM | POA: Diagnosis not present

## 2016-11-07 DIAGNOSIS — T8141XA Infection following a procedure, superficial incisional surgical site, initial encounter: Secondary | ICD-10-CM | POA: Diagnosis not present

## 2016-11-07 DIAGNOSIS — Z21 Asymptomatic human immunodeficiency virus [HIV] infection status: Secondary | ICD-10-CM | POA: Diagnosis not present

## 2016-11-07 DIAGNOSIS — M009 Pyogenic arthritis, unspecified: Secondary | ICD-10-CM | POA: Diagnosis not present

## 2016-11-07 DIAGNOSIS — L0889 Other specified local infections of the skin and subcutaneous tissue: Secondary | ICD-10-CM | POA: Diagnosis not present

## 2016-11-08 DIAGNOSIS — Z96652 Presence of left artificial knee joint: Secondary | ICD-10-CM | POA: Diagnosis not present

## 2016-11-08 DIAGNOSIS — B977 Papillomavirus as the cause of diseases classified elsewhere: Secondary | ICD-10-CM | POA: Diagnosis not present

## 2016-11-08 DIAGNOSIS — I10 Essential (primary) hypertension: Secondary | ICD-10-CM | POA: Diagnosis not present

## 2016-11-08 DIAGNOSIS — I2510 Atherosclerotic heart disease of native coronary artery without angina pectoris: Secondary | ICD-10-CM | POA: Diagnosis not present

## 2016-11-08 DIAGNOSIS — B1920 Unspecified viral hepatitis C without hepatic coma: Secondary | ICD-10-CM | POA: Diagnosis not present

## 2016-11-08 DIAGNOSIS — Z4789 Encounter for other orthopedic aftercare: Secondary | ICD-10-CM | POA: Diagnosis not present

## 2016-11-08 DIAGNOSIS — B199 Unspecified viral hepatitis without hepatic coma: Secondary | ICD-10-CM | POA: Diagnosis not present

## 2016-11-08 DIAGNOSIS — M2550 Pain in unspecified joint: Secondary | ICD-10-CM | POA: Diagnosis not present

## 2016-11-08 DIAGNOSIS — Z7982 Long term (current) use of aspirin: Secondary | ICD-10-CM | POA: Diagnosis not present

## 2016-11-08 DIAGNOSIS — K219 Gastro-esophageal reflux disease without esophagitis: Secondary | ICD-10-CM | POA: Diagnosis not present

## 2016-11-08 DIAGNOSIS — K7460 Unspecified cirrhosis of liver: Secondary | ICD-10-CM | POA: Diagnosis not present

## 2016-11-08 DIAGNOSIS — T8141XA Infection following a procedure, superficial incisional surgical site, initial encounter: Secondary | ICD-10-CM | POA: Diagnosis not present

## 2016-11-08 DIAGNOSIS — Z79899 Other long term (current) drug therapy: Secondary | ICD-10-CM | POA: Diagnosis not present

## 2016-11-08 DIAGNOSIS — Z717 Human immunodeficiency virus [HIV] counseling: Secondary | ICD-10-CM | POA: Diagnosis not present

## 2016-11-08 DIAGNOSIS — R2242 Localized swelling, mass and lump, left lower limb: Secondary | ICD-10-CM | POA: Diagnosis not present

## 2016-11-08 DIAGNOSIS — R531 Weakness: Secondary | ICD-10-CM | POA: Diagnosis not present

## 2016-11-08 DIAGNOSIS — M6281 Muscle weakness (generalized): Secondary | ICD-10-CM | POA: Diagnosis not present

## 2016-11-08 DIAGNOSIS — L0889 Other specified local infections of the skin and subcutaneous tissue: Secondary | ICD-10-CM | POA: Diagnosis not present

## 2016-11-08 DIAGNOSIS — F17200 Nicotine dependence, unspecified, uncomplicated: Secondary | ICD-10-CM | POA: Diagnosis not present

## 2016-11-08 DIAGNOSIS — M009 Pyogenic arthritis, unspecified: Secondary | ICD-10-CM | POA: Diagnosis not present

## 2016-11-08 DIAGNOSIS — I744 Embolism and thrombosis of arteries of extremities, unspecified: Secondary | ICD-10-CM | POA: Diagnosis not present

## 2016-11-08 DIAGNOSIS — J449 Chronic obstructive pulmonary disease, unspecified: Secondary | ICD-10-CM | POA: Diagnosis not present

## 2016-11-08 DIAGNOSIS — M1712 Unilateral primary osteoarthritis, left knee: Secondary | ICD-10-CM | POA: Diagnosis not present

## 2016-11-08 DIAGNOSIS — R262 Difficulty in walking, not elsewhere classified: Secondary | ICD-10-CM | POA: Diagnosis not present

## 2016-11-08 DIAGNOSIS — Y838 Other surgical procedures as the cause of abnormal reaction of the patient, or of later complication, without mention of misadventure at the time of the procedure: Secondary | ICD-10-CM | POA: Diagnosis not present

## 2016-11-08 DIAGNOSIS — M79609 Pain in unspecified limb: Secondary | ICD-10-CM | POA: Diagnosis not present

## 2016-11-08 DIAGNOSIS — M7989 Other specified soft tissue disorders: Secondary | ICD-10-CM | POA: Diagnosis not present

## 2016-11-08 DIAGNOSIS — B009 Herpesviral infection, unspecified: Secondary | ICD-10-CM | POA: Diagnosis not present

## 2016-11-08 DIAGNOSIS — D649 Anemia, unspecified: Secondary | ICD-10-CM | POA: Diagnosis not present

## 2016-11-08 DIAGNOSIS — B20 Human immunodeficiency virus [HIV] disease: Secondary | ICD-10-CM | POA: Diagnosis not present

## 2016-11-08 DIAGNOSIS — S7012XA Contusion of left thigh, initial encounter: Secondary | ICD-10-CM | POA: Diagnosis not present

## 2016-11-08 DIAGNOSIS — Z21 Asymptomatic human immunodeficiency virus [HIV] infection status: Secondary | ICD-10-CM | POA: Diagnosis not present

## 2016-11-08 DIAGNOSIS — N400 Enlarged prostate without lower urinary tract symptoms: Secondary | ICD-10-CM | POA: Diagnosis not present

## 2016-11-08 DIAGNOSIS — L03116 Cellulitis of left lower limb: Secondary | ICD-10-CM | POA: Diagnosis not present

## 2016-11-11 DIAGNOSIS — Z96652 Presence of left artificial knee joint: Secondary | ICD-10-CM | POA: Diagnosis not present

## 2016-11-11 DIAGNOSIS — B20 Human immunodeficiency virus [HIV] disease: Secondary | ICD-10-CM | POA: Diagnosis not present

## 2016-11-11 DIAGNOSIS — M2550 Pain in unspecified joint: Secondary | ICD-10-CM | POA: Diagnosis not present

## 2016-11-17 DIAGNOSIS — Z96652 Presence of left artificial knee joint: Secondary | ICD-10-CM | POA: Diagnosis not present

## 2016-11-17 DIAGNOSIS — I10 Essential (primary) hypertension: Secondary | ICD-10-CM | POA: Diagnosis not present

## 2016-11-17 DIAGNOSIS — M2550 Pain in unspecified joint: Secondary | ICD-10-CM | POA: Diagnosis not present

## 2016-11-17 DIAGNOSIS — L03116 Cellulitis of left lower limb: Secondary | ICD-10-CM | POA: Diagnosis not present

## 2016-11-17 NOTE — Progress Notes (Signed)
I called and informed pt's nurse, [place], at [facility] facility. She spoke to the pt and he said he did not even know that he had cirrhosis. He is not sure about a liver care provider at [facility]. They are aware if he needs to follow up here ( if he doesn't have one at [facility]), that will be fine. I am faxing this note to [place] @ [phone].  She said they have someone that makes appts. They will check it out and if he needs appt here they will let us know. She is aware he needs follow up in 6 months, either here or at [facility].

## 2016-11-18 DIAGNOSIS — S7012XA Contusion of left thigh, initial encounter: Secondary | ICD-10-CM | POA: Diagnosis not present

## 2016-11-24 DIAGNOSIS — L03116 Cellulitis of left lower limb: Secondary | ICD-10-CM | POA: Diagnosis not present

## 2016-11-24 DIAGNOSIS — M1712 Unilateral primary osteoarthritis, left knee: Secondary | ICD-10-CM | POA: Diagnosis not present

## 2016-11-24 DIAGNOSIS — Z96652 Presence of left artificial knee joint: Secondary | ICD-10-CM | POA: Diagnosis not present

## 2016-11-24 DIAGNOSIS — M2550 Pain in unspecified joint: Secondary | ICD-10-CM | POA: Diagnosis not present

## 2016-12-02 DIAGNOSIS — I2510 Atherosclerotic heart disease of native coronary artery without angina pectoris: Secondary | ICD-10-CM | POA: Diagnosis not present

## 2016-12-02 DIAGNOSIS — N400 Enlarged prostate without lower urinary tract symptoms: Secondary | ICD-10-CM | POA: Diagnosis not present

## 2016-12-02 DIAGNOSIS — I10 Essential (primary) hypertension: Secondary | ICD-10-CM | POA: Diagnosis not present

## 2016-12-24 DIAGNOSIS — Z96652 Presence of left artificial knee joint: Secondary | ICD-10-CM | POA: Diagnosis not present

## 2016-12-24 DIAGNOSIS — M1712 Unilateral primary osteoarthritis, left knee: Secondary | ICD-10-CM | POA: Diagnosis not present

## 2016-12-25 ENCOUNTER — Encounter (HOSPITAL_COMMUNITY): Payer: Self-pay | Admitting: Emergency Medicine

## 2016-12-25 ENCOUNTER — Emergency Department (HOSPITAL_BASED_OUTPATIENT_CLINIC_OR_DEPARTMENT_OTHER)
Admit: 2016-12-25 | Discharge: 2016-12-25 | Disposition: A | Discharge status: Home / Self Care | Payer: Medicare Other | Attending: Emergency Medicine | Admitting: Emergency Medicine

## 2016-12-25 ENCOUNTER — Other Ambulatory Visit: Payer: Self-pay

## 2016-12-25 ENCOUNTER — Emergency Department (HOSPITAL_COMMUNITY)
Admission: EM | Admit: 2016-12-25 | Discharge: 2016-12-25 | Disposition: A | Discharge status: Home / Self Care | Payer: Medicare Other | Attending: Emergency Medicine | Admitting: Emergency Medicine

## 2016-12-25 DIAGNOSIS — M7989 Other specified soft tissue disorders: Secondary | ICD-10-CM

## 2016-12-25 DIAGNOSIS — F17200 Nicotine dependence, unspecified, uncomplicated: Secondary | ICD-10-CM | POA: Insufficient documentation

## 2016-12-25 DIAGNOSIS — I10 Essential (primary) hypertension: Secondary | ICD-10-CM | POA: Insufficient documentation

## 2016-12-25 DIAGNOSIS — Z79899 Other long term (current) drug therapy: Secondary | ICD-10-CM | POA: Insufficient documentation

## 2016-12-25 DIAGNOSIS — Z7982 Long term (current) use of aspirin: Secondary | ICD-10-CM | POA: Insufficient documentation

## 2016-12-25 DIAGNOSIS — M79609 Pain in unspecified limb: Secondary | ICD-10-CM

## 2016-12-25 DIAGNOSIS — B20 Human immunodeficiency virus [HIV] disease: Secondary | ICD-10-CM | POA: Insufficient documentation

## 2016-12-25 DIAGNOSIS — R2242 Localized swelling, mass and lump, left lower limb: Secondary | ICD-10-CM | POA: Diagnosis not present

## 2016-12-25 DIAGNOSIS — Z96652 Presence of left artificial knee joint: Secondary | ICD-10-CM | POA: Insufficient documentation

## 2016-12-25 DIAGNOSIS — J449 Chronic obstructive pulmonary disease, unspecified: Secondary | ICD-10-CM | POA: Insufficient documentation

## 2016-12-25 NOTE — ED Triage Notes (Signed)
Pt states that he has trouble with his left knee x several months states is in rehab for it , now it is more swollen and tender touch and is sent today for possible. DVT . Left knee is swollen and firm to touch

## 2016-12-25 NOTE — ED Notes (Signed)
Pt verbalized understanding discharge instructions and denies any further needs or questions at this time. VS stable 

## 2016-12-25 NOTE — ED Provider Notes (Signed)
[facility] Provider Note   CSN: [id number] Arrival date & time: 12/25/16  1024     History   Chief Complaint Chief Complaint  Patient presents with  . Knee Pain    HPI Logan Levine is a 66 y.o. male.  Patient s/p left tka, presents from rehab facility indicating his doctor wanted him to get u/s to rule out blood clot.  Patient denies hx dvt or pe.  States left lower leg swelling since his surgery, moderate, constant, without acute or abrupt worsening today or in past few days. Denies spreading redness or increased swelling. No fever or chills. No chest pain or sob.    The history is provided by the patient.  Knee Pain   Pertinent negatives include no numbness.    Past Medical History:  Diagnosis Date  . Anemia, unspecified   . BPH (benign prostatic hyperplasia)   . Cellulitis of left foot   . Chronic hepatitis C virus infection with cirrhosis ([place])   . COPD (chronic obstructive pulmonary disease) ([place])   . GERD (gastroesophageal reflux disease)   . HIV (human immunodeficiency virus infection) ([place])   . Hyperlipidemia   . Hypertension   . Infectious human wart virus   . Presence of left artificial knee joint     Patient Active Problem List   Diagnosis Date Noted  . Hepatic cirrhosis due to chronic hepatitis C infection ([place]) 10/29/2016  . Constipation 10/29/2016  . TOTAL KNEE REPLACEMENT, LEFT, HX OF 11/12/2009  . KNEE PAIN, ACUTE 11/01/2008  . DEMENTIA, MILD 11/15/2007  . PYELONEPHRITIS, ACUTE 05/28/2007  . HERPES, GENITAL NEC 11/19/2006  . GOUT NOS 11/19/2006  . SMOKER 11/19/2006  . HYPERTENSION, BENIGN ESSENTIAL 11/19/2006  . BRONCHITIS NOS 11/19/2006  . BRONCHITIS, CHRONIC NEC 11/19/2006  . HERNIA, UMBILICAL 40/98/1191  . HIV DISEASE 07/03/2006  . HEPATITIS C 07/03/2006  . SYPHILIS, EARLY, SYMPTOMATIC, PRIMARY NEC 07/03/2006  . OSTEOARTHRITIS 07/03/2006        Home Medications    Prior to Admission  medications   Medication Sig Start Date End Date Taking? Authorizing Provider  albuterol (PROVENTIL HFA;VENTOLIN HFA) 108 (90 Base) MCG/ACT inhaler Inhale 2 puffs into the lungs 2 (two) times daily.    [provider]  amLODipine-atorvastatin (CADUET) 5-10 MG tablet Take 1 tablet by mouth daily.    [provider]  aspirin 325 MG tablet Take 325 mg by mouth daily.    [provider]  celecoxib (CELEBREX) 200 MG capsule Take 200 mg by mouth 2 (two) times daily.    [provider]  docusate sodium (COLACE) 100 MG capsule Take 1 capsule (100 mg total) by mouth 2 (two) times daily. 10/29/16   Carlis Stable, NP  emtricitabine-tenofovir AF (DESCOVY) 200-25 MG tablet Take 1 tablet by mouth daily.    [provider]  hydrochlorothiazide (HYDRODIURIL) 25 MG tablet Take 25 mg by mouth daily.    [provider]  HYDROcodone-acetaminophen (NORCO/VICODIN) 5-325 MG tablet Take 1 tablet by mouth every 4 (four) hours as needed for moderate pain.    [provider]  Magnesium 400 MG TABS Take 1 tablet by mouth daily.    [provider]  Omega-3 Fatty Acids (FISH OIL) 1000 MG CAPS Take 1 capsule by mouth 3 (three) times daily.    [provider]  polyethylene glycol powder (GLYCOLAX/MIRALAX) powder USE 17 gm one to two times daily as needed for no bowel movement in 2+ days 10/29/16  Carlis Stable, NP  QVAR 80 MCG/ACT inhaler INHALE 2 PUFFS INTO MOUTH TWICE DAILY 05/01/11   Tommy Medal, Lavell Islam, MD  raltegravir (ISENTRESS) 400 MG tablet Take 400 mg by mouth 2 (two) times daily.    [provider]  ranitidine (ZANTAC) 150 MG tablet Take 150 mg by mouth daily.    [provider]  TAMSULOSIN HCL PO Take by mouth daily. Tamsulosin 0.8mg  by mouth one time a day    [provider]  VENTOLIN HFA 108 (90 BASE) MCG/ACT inhaler INHALE 2 PUFFS INTO MOUTH EVERY 6 HOURS AS NEEDED FOR COUGH Patient not taking: Reported on  10/29/2016 11/16/10   Tommy Medal, Lavell Islam, MD    Family History No family history on file.  Social History Social History   Tobacco Use  . Smoking status: Current Some Day Smoker  . Smokeless tobacco: Never Used  Substance Use Topics  . Alcohol use: No  . Drug use: Yes    Types: Marijuana     Allergies   Other   Review of Systems Review of Systems  Constitutional: Negative for chills and fever.  Respiratory: Negative for shortness of breath.   Cardiovascular: Positive for leg swelling. Negative for chest pain.  Skin: Negative for rash.  Neurological: Negative for weakness and numbness.     Physical Exam Updated Vital Signs BP 139/86 (BP Location: Right Arm)   Pulse 75   Temp 98.6 F (37 C) (Oral)   Resp 20   SpO2 100%   Physical Exam  Constitutional: He appears well-developed and well-nourished. No distress.  Eyes: Conjunctivae are normal.  Neck: Neck supple. No tracheal deviation present.  Cardiovascular: Normal rate and intact distal pulses.  Pulmonary/Chest: Effort normal. No accessory muscle usage. No respiratory distress.  Abdominal: He exhibits no distension.  Musculoskeletal:  Mild-mod swelling left knee and lower leg. No erythema. Distal pulses palp.   Neurological: He is alert.  Left foot nvi, motor/sens fxn intact.   Skin: Skin is warm and dry. No rash noted. He is not diaphoretic.  Psychiatric: He has a normal mood and affect.  Nursing note and vitals reviewed.    ED Treatments / Results  Labs (all labs ordered are listed, but only abnormal results are displayed) Labs Reviewed - No data to display  EKG  EKG Interpretation None       Radiology No results found.  Procedures Procedures (including critical care time)  Medications Ordered in ED Medications - No data to display   Initial Impression / Assessment and Plan / ED Course  I have reviewed the triage vital signs and the nursing notes.  Pertinent labs & imaging results  that were available during my care of the patient were reviewed by me and considered in my medical decision making (see chart for details).  Vascular doppler ordered.   Reviewed nursing notes and prior charts for additional history.   DVT study negative for dvt: Legrand Como, RVT  Cardiovascular Sonographer  Vascular Lab  Progress Notes  Signed  Date of Service:  12/25/2016 10:59 AM          Signed           [] Hide copied text  [] Hover for details   Left lower extremity venous duplex has been completed. Negative for DVT. Results were given to Dr. Ashok Cordia.  12/25/16 10:59 AM Carlos Levering RVT\           Patient currently appears stable for d/c.  Final Clinical Impressions(s) / ED Diagnoses   Final diagnoses:  None    ED Discharge Orders    None       Lajean Saver, MD 12/25/16 [phone]

## 2016-12-25 NOTE — Progress Notes (Signed)
Left lower extremity venous duplex has been completed. Negative for DVT. Results were given to Dr. Ashok Cordia.  12/25/16 10:59 AM [facility] RVT\

## 2016-12-25 NOTE — ED Notes (Signed)
[facility] contacted to transport patient to [facility]

## 2016-12-25 NOTE — Discharge Instructions (Signed)
It was our pleasure to provide your ER care today - we hope that you feel better.  Your study was negative for dvt.  Keep leg elevated as much as possible to help with the swelling.  Follow up with your orthopedist as planned.  Return to ER if worse, high fevers, spreading redness/pus from wound/incision, other concern.

## 2016-12-25 NOTE — ED Notes (Signed)
Vascular tech is aware of patient 

## 2016-12-25 NOTE — ED Notes (Signed)
[facility] called for PT.

## 2016-12-29 DIAGNOSIS — Z96652 Presence of left artificial knee joint: Secondary | ICD-10-CM | POA: Diagnosis not present

## 2016-12-29 DIAGNOSIS — M009 Pyogenic arthritis, unspecified: Secondary | ICD-10-CM | POA: Diagnosis not present

## 2016-12-29 DIAGNOSIS — L03116 Cellulitis of left lower limb: Secondary | ICD-10-CM | POA: Diagnosis not present

## 2016-12-30 DIAGNOSIS — M009 Pyogenic arthritis, unspecified: Secondary | ICD-10-CM | POA: Diagnosis not present

## 2017-01-14 DIAGNOSIS — M009 Pyogenic arthritis, unspecified: Secondary | ICD-10-CM | POA: Diagnosis not present

## 2017-01-14 DIAGNOSIS — M2550 Pain in unspecified joint: Secondary | ICD-10-CM | POA: Diagnosis not present

## 2017-01-14 DIAGNOSIS — Z96652 Presence of left artificial knee joint: Secondary | ICD-10-CM | POA: Diagnosis not present

## 2017-01-21 DIAGNOSIS — M238X2 Other internal derangements of left knee: Secondary | ICD-10-CM | POA: Diagnosis not present

## 2017-01-21 DIAGNOSIS — T8149XA Infection following a procedure, other surgical site, initial encounter: Secondary | ICD-10-CM | POA: Diagnosis not present

## 2017-01-21 DIAGNOSIS — Z96651 Presence of right artificial knee joint: Secondary | ICD-10-CM | POA: Diagnosis not present

## 2017-01-22 DIAGNOSIS — B182 Chronic viral hepatitis C: Secondary | ICD-10-CM | POA: Diagnosis not present

## 2017-01-22 DIAGNOSIS — B20 Human immunodeficiency virus [HIV] disease: Secondary | ICD-10-CM | POA: Diagnosis not present

## 2017-01-22 DIAGNOSIS — T8454XD Infection and inflammatory reaction due to internal left knee prosthesis, subsequent encounter: Secondary | ICD-10-CM | POA: Diagnosis not present

## 2017-01-22 DIAGNOSIS — J449 Chronic obstructive pulmonary disease, unspecified: Secondary | ICD-10-CM | POA: Diagnosis not present

## 2017-01-22 DIAGNOSIS — I10 Essential (primary) hypertension: Secondary | ICD-10-CM | POA: Diagnosis not present

## 2017-01-22 DIAGNOSIS — K219 Gastro-esophageal reflux disease without esophagitis: Secondary | ICD-10-CM | POA: Diagnosis not present

## 2017-01-22 DIAGNOSIS — F17210 Nicotine dependence, cigarettes, uncomplicated: Secondary | ICD-10-CM | POA: Diagnosis not present

## 2017-01-22 DIAGNOSIS — I2510 Atherosclerotic heart disease of native coronary artery without angina pectoris: Secondary | ICD-10-CM | POA: Diagnosis not present

## 2017-01-22 DIAGNOSIS — K7460 Unspecified cirrhosis of liver: Secondary | ICD-10-CM | POA: Diagnosis not present

## 2017-01-26 DIAGNOSIS — I10 Essential (primary) hypertension: Secondary | ICD-10-CM | POA: Diagnosis not present

## 2017-01-26 DIAGNOSIS — J449 Chronic obstructive pulmonary disease, unspecified: Secondary | ICD-10-CM | POA: Diagnosis not present

## 2017-01-26 DIAGNOSIS — K7460 Unspecified cirrhosis of liver: Secondary | ICD-10-CM | POA: Diagnosis not present

## 2017-01-26 DIAGNOSIS — I2510 Atherosclerotic heart disease of native coronary artery without angina pectoris: Secondary | ICD-10-CM | POA: Diagnosis not present

## 2017-01-26 DIAGNOSIS — B182 Chronic viral hepatitis C: Secondary | ICD-10-CM | POA: Diagnosis not present

## 2017-01-26 DIAGNOSIS — T8454XD Infection and inflammatory reaction due to internal left knee prosthesis, subsequent encounter: Secondary | ICD-10-CM | POA: Diagnosis not present

## 2017-01-29 DIAGNOSIS — J449 Chronic obstructive pulmonary disease, unspecified: Secondary | ICD-10-CM | POA: Diagnosis not present

## 2017-01-29 DIAGNOSIS — T8454XD Infection and inflammatory reaction due to internal left knee prosthesis, subsequent encounter: Secondary | ICD-10-CM | POA: Diagnosis not present

## 2017-01-29 DIAGNOSIS — I2510 Atherosclerotic heart disease of native coronary artery without angina pectoris: Secondary | ICD-10-CM | POA: Diagnosis not present

## 2017-01-29 DIAGNOSIS — K7460 Unspecified cirrhosis of liver: Secondary | ICD-10-CM | POA: Diagnosis not present

## 2017-01-29 DIAGNOSIS — B182 Chronic viral hepatitis C: Secondary | ICD-10-CM | POA: Diagnosis not present

## 2017-01-29 DIAGNOSIS — I10 Essential (primary) hypertension: Secondary | ICD-10-CM | POA: Diagnosis not present

## 2017-02-02 DIAGNOSIS — J449 Chronic obstructive pulmonary disease, unspecified: Secondary | ICD-10-CM | POA: Diagnosis not present

## 2017-02-02 DIAGNOSIS — T8454XD Infection and inflammatory reaction due to internal left knee prosthesis, subsequent encounter: Secondary | ICD-10-CM | POA: Diagnosis not present

## 2017-02-02 DIAGNOSIS — I2510 Atherosclerotic heart disease of native coronary artery without angina pectoris: Secondary | ICD-10-CM | POA: Diagnosis not present

## 2017-02-02 DIAGNOSIS — B182 Chronic viral hepatitis C: Secondary | ICD-10-CM | POA: Diagnosis not present

## 2017-02-02 DIAGNOSIS — K7460 Unspecified cirrhosis of liver: Secondary | ICD-10-CM | POA: Diagnosis not present

## 2017-02-02 DIAGNOSIS — I10 Essential (primary) hypertension: Secondary | ICD-10-CM | POA: Diagnosis not present

## 2017-02-04 DIAGNOSIS — I10 Essential (primary) hypertension: Secondary | ICD-10-CM | POA: Diagnosis not present

## 2017-02-04 DIAGNOSIS — J449 Chronic obstructive pulmonary disease, unspecified: Secondary | ICD-10-CM | POA: Diagnosis not present

## 2017-02-04 DIAGNOSIS — T8454XD Infection and inflammatory reaction due to internal left knee prosthesis, subsequent encounter: Secondary | ICD-10-CM | POA: Diagnosis not present

## 2017-02-04 DIAGNOSIS — B182 Chronic viral hepatitis C: Secondary | ICD-10-CM | POA: Diagnosis not present

## 2017-02-04 DIAGNOSIS — K7460 Unspecified cirrhosis of liver: Secondary | ICD-10-CM | POA: Diagnosis not present

## 2017-02-04 DIAGNOSIS — I2510 Atherosclerotic heart disease of native coronary artery without angina pectoris: Secondary | ICD-10-CM | POA: Diagnosis not present

## 2017-02-05 DIAGNOSIS — I2510 Atherosclerotic heart disease of native coronary artery without angina pectoris: Secondary | ICD-10-CM | POA: Diagnosis not present

## 2017-02-05 DIAGNOSIS — T8454XD Infection and inflammatory reaction due to internal left knee prosthesis, subsequent encounter: Secondary | ICD-10-CM | POA: Diagnosis not present

## 2017-02-05 DIAGNOSIS — K7460 Unspecified cirrhosis of liver: Secondary | ICD-10-CM | POA: Diagnosis not present

## 2017-02-05 DIAGNOSIS — J449 Chronic obstructive pulmonary disease, unspecified: Secondary | ICD-10-CM | POA: Diagnosis not present

## 2017-02-05 DIAGNOSIS — B182 Chronic viral hepatitis C: Secondary | ICD-10-CM | POA: Diagnosis not present

## 2017-02-05 DIAGNOSIS — I10 Essential (primary) hypertension: Secondary | ICD-10-CM | POA: Diagnosis not present

## 2017-02-06 DIAGNOSIS — J449 Chronic obstructive pulmonary disease, unspecified: Secondary | ICD-10-CM | POA: Diagnosis not present

## 2017-02-06 DIAGNOSIS — B182 Chronic viral hepatitis C: Secondary | ICD-10-CM | POA: Diagnosis not present

## 2017-02-06 DIAGNOSIS — K7460 Unspecified cirrhosis of liver: Secondary | ICD-10-CM | POA: Diagnosis not present

## 2017-02-06 DIAGNOSIS — I10 Essential (primary) hypertension: Secondary | ICD-10-CM | POA: Diagnosis not present

## 2017-02-06 DIAGNOSIS — T8454XD Infection and inflammatory reaction due to internal left knee prosthesis, subsequent encounter: Secondary | ICD-10-CM | POA: Diagnosis not present

## 2017-02-06 DIAGNOSIS — I2510 Atherosclerotic heart disease of native coronary artery without angina pectoris: Secondary | ICD-10-CM | POA: Diagnosis not present

## 2017-02-09 DIAGNOSIS — J449 Chronic obstructive pulmonary disease, unspecified: Secondary | ICD-10-CM | POA: Diagnosis not present

## 2017-02-09 DIAGNOSIS — K7460 Unspecified cirrhosis of liver: Secondary | ICD-10-CM | POA: Diagnosis not present

## 2017-02-09 DIAGNOSIS — B182 Chronic viral hepatitis C: Secondary | ICD-10-CM | POA: Diagnosis not present

## 2017-02-09 DIAGNOSIS — I10 Essential (primary) hypertension: Secondary | ICD-10-CM | POA: Diagnosis not present

## 2017-02-09 DIAGNOSIS — I2510 Atherosclerotic heart disease of native coronary artery without angina pectoris: Secondary | ICD-10-CM | POA: Diagnosis not present

## 2017-02-09 DIAGNOSIS — T8454XD Infection and inflammatory reaction due to internal left knee prosthesis, subsequent encounter: Secondary | ICD-10-CM | POA: Diagnosis not present

## 2017-02-10 DIAGNOSIS — T8454XD Infection and inflammatory reaction due to internal left knee prosthesis, subsequent encounter: Secondary | ICD-10-CM | POA: Diagnosis not present

## 2017-02-10 DIAGNOSIS — I10 Essential (primary) hypertension: Secondary | ICD-10-CM | POA: Diagnosis not present

## 2017-02-10 DIAGNOSIS — K7460 Unspecified cirrhosis of liver: Secondary | ICD-10-CM | POA: Diagnosis not present

## 2017-02-10 DIAGNOSIS — J449 Chronic obstructive pulmonary disease, unspecified: Secondary | ICD-10-CM | POA: Diagnosis not present

## 2017-02-10 DIAGNOSIS — I2510 Atherosclerotic heart disease of native coronary artery without angina pectoris: Secondary | ICD-10-CM | POA: Diagnosis not present

## 2017-02-10 DIAGNOSIS — B182 Chronic viral hepatitis C: Secondary | ICD-10-CM | POA: Diagnosis not present

## 2017-02-11 DIAGNOSIS — B182 Chronic viral hepatitis C: Secondary | ICD-10-CM | POA: Diagnosis not present

## 2017-02-11 DIAGNOSIS — T8454XD Infection and inflammatory reaction due to internal left knee prosthesis, subsequent encounter: Secondary | ICD-10-CM | POA: Diagnosis not present

## 2017-02-11 DIAGNOSIS — I2510 Atherosclerotic heart disease of native coronary artery without angina pectoris: Secondary | ICD-10-CM | POA: Diagnosis not present

## 2017-02-11 DIAGNOSIS — K7460 Unspecified cirrhosis of liver: Secondary | ICD-10-CM | POA: Diagnosis not present

## 2017-02-11 DIAGNOSIS — J449 Chronic obstructive pulmonary disease, unspecified: Secondary | ICD-10-CM | POA: Diagnosis not present

## 2017-02-11 DIAGNOSIS — I10 Essential (primary) hypertension: Secondary | ICD-10-CM | POA: Diagnosis not present

## 2017-02-13 DIAGNOSIS — T8454XD Infection and inflammatory reaction due to internal left knee prosthesis, subsequent encounter: Secondary | ICD-10-CM | POA: Diagnosis not present

## 2017-02-13 DIAGNOSIS — I10 Essential (primary) hypertension: Secondary | ICD-10-CM | POA: Diagnosis not present

## 2017-02-13 DIAGNOSIS — I2510 Atherosclerotic heart disease of native coronary artery without angina pectoris: Secondary | ICD-10-CM | POA: Diagnosis not present

## 2017-02-13 DIAGNOSIS — B182 Chronic viral hepatitis C: Secondary | ICD-10-CM | POA: Diagnosis not present

## 2017-02-13 DIAGNOSIS — K7460 Unspecified cirrhosis of liver: Secondary | ICD-10-CM | POA: Diagnosis not present

## 2017-02-13 DIAGNOSIS — J449 Chronic obstructive pulmonary disease, unspecified: Secondary | ICD-10-CM | POA: Diagnosis not present

## 2017-02-17 DIAGNOSIS — B182 Chronic viral hepatitis C: Secondary | ICD-10-CM | POA: Diagnosis not present

## 2017-02-17 DIAGNOSIS — K7460 Unspecified cirrhosis of liver: Secondary | ICD-10-CM | POA: Diagnosis not present

## 2017-02-17 DIAGNOSIS — T8454XD Infection and inflammatory reaction due to internal left knee prosthesis, subsequent encounter: Secondary | ICD-10-CM | POA: Diagnosis not present

## 2017-02-17 DIAGNOSIS — I10 Essential (primary) hypertension: Secondary | ICD-10-CM | POA: Diagnosis not present

## 2017-02-17 DIAGNOSIS — J449 Chronic obstructive pulmonary disease, unspecified: Secondary | ICD-10-CM | POA: Diagnosis not present

## 2017-02-17 DIAGNOSIS — I2510 Atherosclerotic heart disease of native coronary artery without angina pectoris: Secondary | ICD-10-CM | POA: Diagnosis not present

## 2017-02-20 DIAGNOSIS — I2510 Atherosclerotic heart disease of native coronary artery without angina pectoris: Secondary | ICD-10-CM | POA: Diagnosis not present

## 2017-02-20 DIAGNOSIS — K7460 Unspecified cirrhosis of liver: Secondary | ICD-10-CM | POA: Diagnosis not present

## 2017-02-20 DIAGNOSIS — J449 Chronic obstructive pulmonary disease, unspecified: Secondary | ICD-10-CM | POA: Diagnosis not present

## 2017-02-20 DIAGNOSIS — B182 Chronic viral hepatitis C: Secondary | ICD-10-CM | POA: Diagnosis not present

## 2017-02-20 DIAGNOSIS — T8454XD Infection and inflammatory reaction due to internal left knee prosthesis, subsequent encounter: Secondary | ICD-10-CM | POA: Diagnosis not present

## 2017-02-20 DIAGNOSIS — I10 Essential (primary) hypertension: Secondary | ICD-10-CM | POA: Diagnosis not present

## 2017-02-23 DIAGNOSIS — I2510 Atherosclerotic heart disease of native coronary artery without angina pectoris: Secondary | ICD-10-CM | POA: Diagnosis not present

## 2017-02-23 DIAGNOSIS — B182 Chronic viral hepatitis C: Secondary | ICD-10-CM | POA: Diagnosis not present

## 2017-02-23 DIAGNOSIS — J449 Chronic obstructive pulmonary disease, unspecified: Secondary | ICD-10-CM | POA: Diagnosis not present

## 2017-02-23 DIAGNOSIS — T8454XD Infection and inflammatory reaction due to internal left knee prosthesis, subsequent encounter: Secondary | ICD-10-CM | POA: Diagnosis not present

## 2017-02-23 DIAGNOSIS — I10 Essential (primary) hypertension: Secondary | ICD-10-CM | POA: Diagnosis not present

## 2017-02-23 DIAGNOSIS — K7460 Unspecified cirrhosis of liver: Secondary | ICD-10-CM | POA: Diagnosis not present

## 2017-02-24 DIAGNOSIS — J449 Chronic obstructive pulmonary disease, unspecified: Secondary | ICD-10-CM | POA: Diagnosis not present

## 2017-02-24 DIAGNOSIS — B182 Chronic viral hepatitis C: Secondary | ICD-10-CM | POA: Diagnosis not present

## 2017-02-24 DIAGNOSIS — I2510 Atherosclerotic heart disease of native coronary artery without angina pectoris: Secondary | ICD-10-CM | POA: Diagnosis not present

## 2017-02-24 DIAGNOSIS — K7460 Unspecified cirrhosis of liver: Secondary | ICD-10-CM | POA: Diagnosis not present

## 2017-02-24 DIAGNOSIS — T8454XD Infection and inflammatory reaction due to internal left knee prosthesis, subsequent encounter: Secondary | ICD-10-CM | POA: Diagnosis not present

## 2017-02-24 DIAGNOSIS — I10 Essential (primary) hypertension: Secondary | ICD-10-CM | POA: Diagnosis not present

## 2017-02-25 DIAGNOSIS — J449 Chronic obstructive pulmonary disease, unspecified: Secondary | ICD-10-CM | POA: Diagnosis not present

## 2017-02-25 DIAGNOSIS — T8454XD Infection and inflammatory reaction due to internal left knee prosthesis, subsequent encounter: Secondary | ICD-10-CM | POA: Diagnosis not present

## 2017-02-25 DIAGNOSIS — I10 Essential (primary) hypertension: Secondary | ICD-10-CM | POA: Diagnosis not present

## 2017-02-25 DIAGNOSIS — K7460 Unspecified cirrhosis of liver: Secondary | ICD-10-CM | POA: Diagnosis not present

## 2017-02-25 DIAGNOSIS — I2510 Atherosclerotic heart disease of native coronary artery without angina pectoris: Secondary | ICD-10-CM | POA: Diagnosis not present

## 2017-02-25 DIAGNOSIS — B182 Chronic viral hepatitis C: Secondary | ICD-10-CM | POA: Diagnosis not present

## 2017-03-02 DIAGNOSIS — K7460 Unspecified cirrhosis of liver: Secondary | ICD-10-CM | POA: Diagnosis not present

## 2017-03-02 DIAGNOSIS — I2510 Atherosclerotic heart disease of native coronary artery without angina pectoris: Secondary | ICD-10-CM | POA: Diagnosis not present

## 2017-03-02 DIAGNOSIS — J449 Chronic obstructive pulmonary disease, unspecified: Secondary | ICD-10-CM | POA: Diagnosis not present

## 2017-03-02 DIAGNOSIS — T8454XD Infection and inflammatory reaction due to internal left knee prosthesis, subsequent encounter: Secondary | ICD-10-CM | POA: Diagnosis not present

## 2017-03-02 DIAGNOSIS — I10 Essential (primary) hypertension: Secondary | ICD-10-CM | POA: Diagnosis not present

## 2017-03-02 DIAGNOSIS — B182 Chronic viral hepatitis C: Secondary | ICD-10-CM | POA: Diagnosis not present

## 2017-03-04 DIAGNOSIS — B182 Chronic viral hepatitis C: Secondary | ICD-10-CM | POA: Diagnosis not present

## 2017-03-04 DIAGNOSIS — T8454XD Infection and inflammatory reaction due to internal left knee prosthesis, subsequent encounter: Secondary | ICD-10-CM | POA: Diagnosis not present

## 2017-03-04 DIAGNOSIS — I2510 Atherosclerotic heart disease of native coronary artery without angina pectoris: Secondary | ICD-10-CM | POA: Diagnosis not present

## 2017-03-04 DIAGNOSIS — I10 Essential (primary) hypertension: Secondary | ICD-10-CM | POA: Diagnosis not present

## 2017-03-04 DIAGNOSIS — K7460 Unspecified cirrhosis of liver: Secondary | ICD-10-CM | POA: Diagnosis not present

## 2017-03-04 DIAGNOSIS — J449 Chronic obstructive pulmonary disease, unspecified: Secondary | ICD-10-CM | POA: Diagnosis not present

## 2017-03-09 DIAGNOSIS — M25562 Pain in left knee: Secondary | ICD-10-CM | POA: Diagnosis not present

## 2017-03-11 DIAGNOSIS — T8149XA Infection following a procedure, other surgical site, initial encounter: Secondary | ICD-10-CM | POA: Diagnosis not present

## 2017-03-11 DIAGNOSIS — M238X2 Other internal derangements of left knee: Secondary | ICD-10-CM | POA: Diagnosis not present

## 2017-03-11 DIAGNOSIS — Z96651 Presence of right artificial knee joint: Secondary | ICD-10-CM | POA: Diagnosis not present

## 2017-03-11 DIAGNOSIS — M25561 Pain in right knee: Secondary | ICD-10-CM | POA: Diagnosis not present

## 2017-03-13 DIAGNOSIS — Z471 Aftercare following joint replacement surgery: Secondary | ICD-10-CM | POA: Diagnosis not present

## 2017-03-13 DIAGNOSIS — Z96652 Presence of left artificial knee joint: Secondary | ICD-10-CM | POA: Diagnosis not present

## 2017-03-13 DIAGNOSIS — M25562 Pain in left knee: Secondary | ICD-10-CM | POA: Diagnosis not present

## 2017-03-13 DIAGNOSIS — R262 Difficulty in walking, not elsewhere classified: Secondary | ICD-10-CM | POA: Diagnosis not present

## 2017-03-17 DIAGNOSIS — R262 Difficulty in walking, not elsewhere classified: Secondary | ICD-10-CM | POA: Diagnosis not present

## 2017-03-17 DIAGNOSIS — M25562 Pain in left knee: Secondary | ICD-10-CM | POA: Diagnosis not present

## 2017-03-17 DIAGNOSIS — Z471 Aftercare following joint replacement surgery: Secondary | ICD-10-CM | POA: Diagnosis not present

## 2017-03-17 DIAGNOSIS — Z96652 Presence of left artificial knee joint: Secondary | ICD-10-CM | POA: Diagnosis not present

## 2017-03-18 DIAGNOSIS — M25562 Pain in left knee: Secondary | ICD-10-CM | POA: Diagnosis not present

## 2017-03-18 DIAGNOSIS — Z471 Aftercare following joint replacement surgery: Secondary | ICD-10-CM | POA: Diagnosis not present

## 2017-03-18 DIAGNOSIS — Z96652 Presence of left artificial knee joint: Secondary | ICD-10-CM | POA: Diagnosis not present

## 2017-03-18 DIAGNOSIS — R262 Difficulty in walking, not elsewhere classified: Secondary | ICD-10-CM | POA: Diagnosis not present

## 2017-03-20 DIAGNOSIS — Z96652 Presence of left artificial knee joint: Secondary | ICD-10-CM | POA: Diagnosis not present

## 2017-03-20 DIAGNOSIS — M25562 Pain in left knee: Secondary | ICD-10-CM | POA: Diagnosis not present

## 2017-03-20 DIAGNOSIS — Z471 Aftercare following joint replacement surgery: Secondary | ICD-10-CM | POA: Diagnosis not present

## 2017-03-20 DIAGNOSIS — R262 Difficulty in walking, not elsewhere classified: Secondary | ICD-10-CM | POA: Diagnosis not present

## 2017-03-23 DIAGNOSIS — R262 Difficulty in walking, not elsewhere classified: Secondary | ICD-10-CM | POA: Diagnosis not present

## 2017-03-23 DIAGNOSIS — Z96652 Presence of left artificial knee joint: Secondary | ICD-10-CM | POA: Diagnosis not present

## 2017-03-23 DIAGNOSIS — M25562 Pain in left knee: Secondary | ICD-10-CM | POA: Diagnosis not present

## 2017-03-23 DIAGNOSIS — Z471 Aftercare following joint replacement surgery: Secondary | ICD-10-CM | POA: Diagnosis not present

## 2017-03-25 DIAGNOSIS — R262 Difficulty in walking, not elsewhere classified: Secondary | ICD-10-CM | POA: Diagnosis not present

## 2017-03-25 DIAGNOSIS — M25562 Pain in left knee: Secondary | ICD-10-CM | POA: Diagnosis not present

## 2017-03-25 DIAGNOSIS — Z471 Aftercare following joint replacement surgery: Secondary | ICD-10-CM | POA: Diagnosis not present

## 2017-03-25 DIAGNOSIS — Z96652 Presence of left artificial knee joint: Secondary | ICD-10-CM | POA: Diagnosis not present

## 2017-03-26 DIAGNOSIS — Z96652 Presence of left artificial knee joint: Secondary | ICD-10-CM | POA: Diagnosis not present

## 2017-03-26 DIAGNOSIS — M25562 Pain in left knee: Secondary | ICD-10-CM | POA: Diagnosis not present

## 2017-03-27 DIAGNOSIS — R262 Difficulty in walking, not elsewhere classified: Secondary | ICD-10-CM | POA: Diagnosis not present

## 2017-03-27 DIAGNOSIS — Z471 Aftercare following joint replacement surgery: Secondary | ICD-10-CM | POA: Diagnosis not present

## 2017-03-27 DIAGNOSIS — Z96652 Presence of left artificial knee joint: Secondary | ICD-10-CM | POA: Diagnosis not present

## 2017-03-27 DIAGNOSIS — M25562 Pain in left knee: Secondary | ICD-10-CM | POA: Diagnosis not present

## 2017-03-30 DIAGNOSIS — Z96652 Presence of left artificial knee joint: Secondary | ICD-10-CM | POA: Diagnosis not present

## 2017-03-30 DIAGNOSIS — R262 Difficulty in walking, not elsewhere classified: Secondary | ICD-10-CM | POA: Diagnosis not present

## 2017-03-30 DIAGNOSIS — M25562 Pain in left knee: Secondary | ICD-10-CM | POA: Diagnosis not present

## 2017-03-30 DIAGNOSIS — Z471 Aftercare following joint replacement surgery: Secondary | ICD-10-CM | POA: Diagnosis not present

## 2017-04-01 DIAGNOSIS — Z471 Aftercare following joint replacement surgery: Secondary | ICD-10-CM | POA: Diagnosis not present

## 2017-04-01 DIAGNOSIS — M25562 Pain in left knee: Secondary | ICD-10-CM | POA: Diagnosis not present

## 2017-04-01 DIAGNOSIS — Z96652 Presence of left artificial knee joint: Secondary | ICD-10-CM | POA: Diagnosis not present

## 2017-04-01 DIAGNOSIS — R262 Difficulty in walking, not elsewhere classified: Secondary | ICD-10-CM | POA: Diagnosis not present

## 2017-04-03 DIAGNOSIS — M25562 Pain in left knee: Secondary | ICD-10-CM | POA: Diagnosis not present

## 2017-04-03 DIAGNOSIS — Z96652 Presence of left artificial knee joint: Secondary | ICD-10-CM | POA: Diagnosis not present

## 2017-04-03 DIAGNOSIS — R262 Difficulty in walking, not elsewhere classified: Secondary | ICD-10-CM | POA: Diagnosis not present

## 2017-04-06 DIAGNOSIS — M25562 Pain in left knee: Secondary | ICD-10-CM | POA: Diagnosis not present

## 2017-04-06 DIAGNOSIS — R262 Difficulty in walking, not elsewhere classified: Secondary | ICD-10-CM | POA: Diagnosis not present

## 2017-04-06 DIAGNOSIS — Z96652 Presence of left artificial knee joint: Secondary | ICD-10-CM | POA: Diagnosis not present

## 2017-04-08 DIAGNOSIS — Z96652 Presence of left artificial knee joint: Secondary | ICD-10-CM | POA: Diagnosis not present

## 2017-04-08 DIAGNOSIS — M25562 Pain in left knee: Secondary | ICD-10-CM | POA: Diagnosis not present

## 2017-04-08 DIAGNOSIS — R262 Difficulty in walking, not elsewhere classified: Secondary | ICD-10-CM | POA: Diagnosis not present

## 2017-04-10 DIAGNOSIS — R262 Difficulty in walking, not elsewhere classified: Secondary | ICD-10-CM | POA: Diagnosis not present

## 2017-04-10 DIAGNOSIS — M25562 Pain in left knee: Secondary | ICD-10-CM | POA: Diagnosis not present

## 2017-04-10 DIAGNOSIS — Z96652 Presence of left artificial knee joint: Secondary | ICD-10-CM | POA: Diagnosis not present

## 2017-04-13 DIAGNOSIS — R262 Difficulty in walking, not elsewhere classified: Secondary | ICD-10-CM | POA: Diagnosis not present

## 2017-04-13 DIAGNOSIS — M25562 Pain in left knee: Secondary | ICD-10-CM | POA: Diagnosis not present

## 2017-04-13 DIAGNOSIS — Z96652 Presence of left artificial knee joint: Secondary | ICD-10-CM | POA: Diagnosis not present

## 2017-04-15 DIAGNOSIS — M25562 Pain in left knee: Secondary | ICD-10-CM | POA: Diagnosis not present

## 2017-04-15 DIAGNOSIS — R262 Difficulty in walking, not elsewhere classified: Secondary | ICD-10-CM | POA: Diagnosis not present

## 2017-04-15 DIAGNOSIS — Z96652 Presence of left artificial knee joint: Secondary | ICD-10-CM | POA: Diagnosis not present

## 2017-04-17 ENCOUNTER — Ambulatory Visit: Payer: Self-pay | Admitting: Orthopedic Surgery

## 2017-04-17 DIAGNOSIS — R262 Difficulty in walking, not elsewhere classified: Secondary | ICD-10-CM | POA: Diagnosis not present

## 2017-04-17 DIAGNOSIS — Z96652 Presence of left artificial knee joint: Secondary | ICD-10-CM | POA: Diagnosis not present

## 2017-04-17 DIAGNOSIS — M25562 Pain in left knee: Secondary | ICD-10-CM | POA: Diagnosis not present

## 2017-04-20 DIAGNOSIS — Z96652 Presence of left artificial knee joint: Secondary | ICD-10-CM | POA: Diagnosis not present

## 2017-04-20 DIAGNOSIS — M25562 Pain in left knee: Secondary | ICD-10-CM | POA: Diagnosis not present

## 2017-04-20 DIAGNOSIS — R262 Difficulty in walking, not elsewhere classified: Secondary | ICD-10-CM | POA: Diagnosis not present

## 2017-04-22 DIAGNOSIS — Z96652 Presence of left artificial knee joint: Secondary | ICD-10-CM | POA: Diagnosis not present

## 2017-04-22 DIAGNOSIS — M25562 Pain in left knee: Secondary | ICD-10-CM | POA: Diagnosis not present

## 2017-04-22 DIAGNOSIS — R262 Difficulty in walking, not elsewhere classified: Secondary | ICD-10-CM | POA: Diagnosis not present

## 2017-04-24 ENCOUNTER — Encounter (HOSPITAL_COMMUNITY): Payer: Self-pay

## 2017-04-24 DIAGNOSIS — R262 Difficulty in walking, not elsewhere classified: Secondary | ICD-10-CM | POA: Diagnosis not present

## 2017-04-24 DIAGNOSIS — M25562 Pain in left knee: Secondary | ICD-10-CM | POA: Diagnosis not present

## 2017-04-24 DIAGNOSIS — Z96652 Presence of left artificial knee joint: Secondary | ICD-10-CM | POA: Diagnosis not present

## 2017-04-24 NOTE — Pre-Procedure Instructions (Signed)
Last office visit note Gill, NP 11/05/2016 in epic.

## 2017-04-24 NOTE — Patient Instructions (Signed)
Your procedure is scheduled on: Monday, May 04, 2017   Surgery Time:  1:30PM-3:30PM   Report to [facility]  Entrance    Report to admitting at 11:00 AM   Call this number if you have problems the morning of surgery [phone]   Do not eat food:After Midnight.   Do NOT smoke after Midnight   May have liquids until 7:30AM day of surgery   CLEAR LIQUID DIET   Foods Allowed                                                                     Foods Excluded  Coffee and tea, regular and decaf                             liquids that you cannot  Plain Jell-O in any flavor                                             see through such as: Fruit ices (not with fruit pulp)                                     milk, soups, orange juice  Iced Popsicles                                    All solid food Carbonated beverages, regular and diet                                    Cranberry, grape and apple juices Sports drinks like Gatorade Lightly seasoned clear broth or consume(fat free) Sugar, honey syrup  Sample Menu Breakfast                                Lunch                                     Supper Cranberry juice                    Beef broth                            Chicken broth Jell-O                                     Grape juice                           Apple juice Coffee or tea  Jell-O                                      Popsicle                                                Coffee or tea                        Coffee or tea   Take these medicines the morning of surgery with A SIP OF WATER: Amlodipine, Emtricitabine-tenofovir, Raltegravir, Ranitidine, Tamsulosin                               You may not have any metal on your body including jewelry, and body piercings             Do not wear lotions, powders, perfumes/cologne, or deodorant                           Men may shave face and neck.   Do not bring valuables to the  hospital. [place].   Contacts, dentures or bridgework may not be worn into surgery.   Leave suitcase in the car. After surgery it may be brought to your room.   Special Instructions: Bring a copy of your healthcare power of attorney and living will documents         the day of surgery if you haven't scanned them in before.              Please read over the following fact sheets you were given:  [facility] - Preparing for Surgery Before surgery, you can play an important role.  Because skin is not sterile, your skin needs to be as free of germs as possible.  You can reduce the number of germs on your skin by washing with CHG (chlorahexidine gluconate) soap before surgery.  CHG is an antiseptic cleaner which kills germs and bonds with the skin to continue killing germs even after washing. Please DO NOT use if you have an allergy to CHG or antibacterial soaps.  If your skin becomes reddened/irritated stop using the CHG and inform your nurse when you arrive at Short Stay. Do not shave (including legs and underarms) for at least 48 hours prior to the first CHG shower.  You may shave your face/neck.  Please follow these instructions carefully:  1.  Shower with CHG Soap the night before surgery and the  morning of surgery.  2.  If you choose to wash your hair, wash your hair first as usual with your normal  shampoo.  3.  After you shampoo, rinse your hair and body thoroughly to remove the shampoo.                             4.  Use CHG as you would any other liquid soap.  You can apply chg directly to the skin and wash.  Gently with a scrungie or clean washcloth.  5.  Apply the CHG Soap to your body ONLY FROM THE NECK DOWN.   Do   not use on face/ open                           Wound or open sores. Avoid contact with eyes, ears mouth and   genitals (private parts).                       Wash face,  Genitals (private parts) with your normal soap.              6.  Wash thoroughly, paying special attention to the area where your    surgery  will be performed.  7.  Thoroughly rinse your body with warm water from the neck down.  8.  DO NOT shower/wash with your normal soap after using and rinsing off the CHG Soap.                9.  Pat yourself dry with a clean towel.            10.  Wear clean pajamas.            11.  Place clean sheets on your bed the night of your first shower and do not  sleep with pets. Day of Surgery : Do not apply any lotions/deodorants the morning of surgery.  Please wear clean clothes to the hospital/surgery center.  FAILURE TO FOLLOW THESE INSTRUCTIONS MAY RESULT IN THE CANCELLATION OF YOUR SURGERY  PATIENT SIGNATURE_________________________________  NURSE SIGNATURE__________________________________  ________________________________________________________________________   Logan Levine  An incentive spirometer is a tool that can help keep your lungs clear and active. This tool measures how well you are filling your lungs with each breath. Taking long deep breaths may help reverse or decrease the chance of developing breathing (pulmonary) problems (especially infection) following:  A long period of time when you are unable to move or be active. BEFORE THE PROCEDURE   If the spirometer includes an indicator to show your best effort, your nurse or respiratory therapist will set it to a desired goal.  If possible, sit up straight or lean slightly forward. Try not to slouch.  Hold the incentive spirometer in an upright position. INSTRUCTIONS FOR USE  1. Sit on the edge of your bed if possible, or sit up as far as you can in bed or on a chair. 2. Hold the incentive spirometer in an upright position. 3. Breathe out normally. 4. Place the mouthpiece in your mouth and seal your lips tightly around it. 5. Breathe in slowly and as deeply as possible, raising the piston or the ball toward the top of the  column. 6. Hold your breath for 3-5 seconds or for as long as possible. Allow the piston or ball to fall to the bottom of the column. 7. Remove the mouthpiece from your mouth and breathe out normally. 8. Rest for a few seconds and repeat Steps 1 through 7 at least 10 times every 1-2 hours when you are awake. Take your time and take a few normal breaths between deep breaths. 9. The spirometer may include an indicator to show your best effort. Use the indicator as a goal to work toward during each repetition. 10. After each set of 10 deep breaths, practice coughing to be sure your lungs are clear. If you have an incision (the cut made at the time of surgery), support your  incision when coughing by placing a pillow or rolled up towels firmly against it. Once you are able to get out of bed, walk around indoors and cough well. You may stop using the incentive spirometer when instructed by your caregiver.  RISKS AND COMPLICATIONS  Take your time so you do not get dizzy or light-headed.  If you are in pain, you may need to take or ask for pain medication before doing incentive spirometry. It is harder to take a deep breath if you are having pain. AFTER USE  Rest and breathe slowly and easily.  It can be helpful to keep track of a log of your progress. Your caregiver can provide you with a simple table to help with this. If you are using the spirometer at home, follow these instructions: [place] IF:   You are having difficultly using the spirometer.  You have trouble using the spirometer as often as instructed.  Your pain medication is not giving enough relief while using the spirometer.  You develop fever of 100.5 F (38.1 C) or higher. SEEK IMMEDIATE MEDICAL CARE IF:   You cough up bloody sputum that had not been present before.  You develop fever of 102 F (38.9 C) or greater.  You develop worsening pain at or near the incision site. MAKE SURE YOU:   Understand these  instructions.  Will watch your condition.  Will get help right away if you are not doing well or get worse. Document Released: 06/02/2006 Document Revised: 04/14/2011 Document Reviewed: 08/03/2006 ExitCare Patient Information 2014 Exit[employer], [place].   ________________________________________________________________________  WHAT IS A BLOOD TRANSFUSION? Blood Transfusion Information  A transfusion is the replacement of blood or some of its parts. Blood is made up of multiple cells which provide different functions.  Red blood cells carry oxygen and are used for blood loss replacement.  White blood cells fight against infection.  Platelets control bleeding.  Plasma helps clot blood.  Other blood products are available for specialized needs, such as hemophilia or other clotting disorders. BEFORE THE TRANSFUSION  Who gives blood for transfusions?   Healthy volunteers who are fully evaluated to make sure their blood is safe. This is blood bank blood. Transfusion therapy is the safest it has ever been in the practice of medicine. Before blood is taken from a donor, a complete history is taken to make sure that person has no history of diseases nor engages in risky social behavior (examples are intravenous drug use or sexual activity with multiple partners). The donor's travel history is screened to minimize risk of transmitting infections, such as malaria. The donated blood is tested for signs of infectious diseases, such as HIV and hepatitis. The blood is then tested to be sure it is compatible with you in order to minimize the chance of a transfusion reaction. If you or a relative donates blood, this is often done in anticipation of surgery and is not appropriate for emergency situations. It takes many days to process the donated blood. RISKS AND COMPLICATIONS Although transfusion therapy is very safe and saves many lives, the main dangers of transfusion include:   Getting an infectious  disease.  Developing a transfusion reaction. This is an allergic reaction to something in the blood you were given. Every precaution is taken to prevent this. The decision to have a blood transfusion has been considered carefully by your caregiver before blood is given. Blood is not given unless the benefits outweigh the risks. AFTER THE TRANSFUSION  Right  after receiving a blood transfusion, you will usually feel much better and more energetic. This is especially true if your red blood cells have gotten low (anemic). The transfusion raises the level of the red blood cells which carry oxygen, and this usually causes an energy increase.  The nurse administering the transfusion will monitor you carefully for complications. HOME CARE INSTRUCTIONS  No special instructions are needed after a transfusion. You may find your energy is better. Speak with your caregiver about any limitations on activity for underlying diseases you may have. SEEK MEDICAL CARE IF:   Your condition is not improving after your transfusion.  You develop redness or irritation at the intravenous (IV) site. SEEK IMMEDIATE MEDICAL CARE IF:  Any of the following symptoms occur over the next 12 hours:  Shaking chills.  You have a temperature by mouth above 102 F (38.9 C), not controlled by medicine.  Chest, back, or muscle pain.  People around you feel you are not acting correctly or are confused.  Shortness of breath or difficulty breathing.  Dizziness and fainting.  You get a rash or develop hives.  You have a decrease in urine output.  Your urine turns a dark color or changes to pink, red, or brown. Any of the following symptoms occur over the next 10 days:  You have a temperature by mouth above 102 F (38.9 C), not controlled by medicine.  Shortness of breath.  Weakness after normal activity.  The white part of the eye turns yellow (jaundice).  You have a decrease in the amount of urine or are  urinating less often.  Your urine turns a dark color or changes to pink, red, or brown. Document Released: 01/18/2000 Document Revised: 04/14/2011 Document Reviewed: 09/06/2007 [facility] Patient Information 2014 [place], [place].  _______________________________________________________________________

## 2017-04-27 DIAGNOSIS — M25562 Pain in left knee: Secondary | ICD-10-CM | POA: Diagnosis not present

## 2017-04-27 DIAGNOSIS — Z96652 Presence of left artificial knee joint: Secondary | ICD-10-CM | POA: Diagnosis not present

## 2017-04-27 DIAGNOSIS — R262 Difficulty in walking, not elsewhere classified: Secondary | ICD-10-CM | POA: Diagnosis not present

## 2017-04-30 ENCOUNTER — Other Ambulatory Visit: Payer: Self-pay

## 2017-04-30 ENCOUNTER — Encounter (HOSPITAL_COMMUNITY): Payer: Self-pay

## 2017-04-30 ENCOUNTER — Encounter (HOSPITAL_COMMUNITY)
Admission: RE | Admit: 2017-04-30 | Discharge: 2017-04-30 | Disposition: A | Discharge status: Home / Self Care | Payer: Medicare Other | Source: Ambulatory Visit | Attending: Orthopedic Surgery | Admitting: Orthopedic Surgery

## 2017-04-30 DIAGNOSIS — Z01812 Encounter for preprocedural laboratory examination: Secondary | ICD-10-CM | POA: Diagnosis not present

## 2017-04-30 DIAGNOSIS — R262 Difficulty in walking, not elsewhere classified: Secondary | ICD-10-CM | POA: Diagnosis not present

## 2017-04-30 DIAGNOSIS — M25562 Pain in left knee: Secondary | ICD-10-CM | POA: Diagnosis not present

## 2017-04-30 DIAGNOSIS — Z01810 Encounter for preprocedural cardiovascular examination: Secondary | ICD-10-CM | POA: Insufficient documentation

## 2017-04-30 DIAGNOSIS — Z96652 Presence of left artificial knee joint: Secondary | ICD-10-CM | POA: Diagnosis not present

## 2017-04-30 HISTORY — DX: Unspecified chronic bronchitis: J42

## 2017-04-30 HISTORY — DX: Personal history of urinary (tract) infections: Z87.440

## 2017-04-30 HISTORY — DX: Umbilical hernia without obstruction or gangrene: K42.9

## 2017-04-30 HISTORY — DX: Gout, unspecified: M10.9

## 2017-04-30 HISTORY — DX: Unspecified dementia, unspecified severity, without behavioral disturbance, psychotic disturbance, mood disturbance, and anxiety: F03.90

## 2017-04-30 HISTORY — DX: Syphilis, unspecified: A53.9

## 2017-04-30 HISTORY — DX: Personal history of other specified conditions: Z87.898

## 2017-04-30 HISTORY — DX: Constipation, unspecified: K59.00

## 2017-04-30 HISTORY — DX: Personal history of other diseases of urinary system: Z87.448

## 2017-04-30 HISTORY — DX: Unspecified osteoarthritis, unspecified site: M19.90

## 2017-04-30 HISTORY — DX: Herpesviral infection of other male genital organs: A60.02

## 2017-04-30 LAB — COMPREHENSIVE METABOLIC PANEL
ALT: 54 U/L (ref 17–63)
ANION GAP: 7 (ref 5–15)
AST: 75 U/L — AB (ref 15–41)
Albumin: 3 g/dL — ABNORMAL LOW (ref 3.5–5.0)
Alkaline Phosphatase: 136 U/L — ABNORMAL HIGH (ref 38–126)
BUN: 41 mg/dL — AB (ref 6–20)
CO2: 23 mmol/L (ref 22–32)
Calcium: 9.4 mg/dL (ref 8.9–10.3)
Chloride: 105 mmol/L (ref 101–111)
Creatinine, Ser: 1.3 mg/dL — ABNORMAL HIGH (ref 0.61–1.24)
GFR calc Af Amer: >60 mL/min (ref >60)
GFR, EST NON AFRICAN AMERICAN: 56 mL/min — AB (ref >60)
Glucose, Bld: 94 mg/dL (ref 65–99)
POTASSIUM: 5.4 mmol/L — AB (ref 3.5–5.1)
Sodium: 135 mmol/L (ref 135–145)
Total Bilirubin: 0.5 mg/dL (ref 0.3–1.2)
Total Protein: 7.8 g/dL (ref 6.5–8.1)

## 2017-04-30 LAB — CBC
HCT: 37 % — ABNORMAL LOW (ref 39.0–52.0)
Hemoglobin: 12.3 g/dL — ABNORMAL LOW (ref 13.0–17.0)
MCH: 29.4 pg (ref 26.0–34.0)
MCHC: 33.2 g/dL (ref 30.0–36.0)
MCV: 88.5 fL (ref 78.0–100.0)
PLATELETS: 154 10*3/uL (ref 150–400)
RBC: 4.18 MIL/uL — ABNORMAL LOW (ref 4.22–5.81)
RDW: 18.9 % — AB (ref 11.5–15.5)
WBC: 2.7 10*3/uL — AB (ref 4.0–10.5)

## 2017-04-30 LAB — SURGICAL PCR SCREEN
MRSA, PCR: NEGATIVE
STAPHYLOCOCCUS AUREUS: NEGATIVE

## 2017-04-30 LAB — APTT: APTT: 35 s (ref 24–36)

## 2017-04-30 LAB — PROTIME-INR
INR: 1.21
PROTHROMBIN TIME: 15.2 s (ref 11.4–15.2)

## 2017-04-30 LAB — ABO/RH: ABO/RH(D): O POS

## 2017-04-30 NOTE — Pre-Procedure Instructions (Signed)
CBC and CMP results 04/30/2017 faxed to Dr. Wynelle Link via epic.

## 2017-04-30 NOTE — Pre-Procedure Instructions (Signed)
Dr. Kalman Shan made aware of CMP results 04/30/2017.  Ordered a repeat Potassium day of surgery.  Order placed in epic.

## 2017-05-03 NOTE — Anesthesia Preprocedure Evaluation (Addendum)
Anesthesia Evaluation  Patient identified by MRN, date of birth, ID band Patient awake    Reviewed: Allergy & Precautions, NPO status , Patient's Chart, lab work & pertinent test results  Airway Mallampati: II  TM Distance: >3 FB Neck ROM: Full    Dental  (+) Edentulous Lower, Edentulous Upper   Pulmonary COPD, Current Smoker,    Pulmonary exam normal breath sounds clear to auscultation       Cardiovascular Exercise Tolerance: Good hypertension, Pt. on medications Normal cardiovascular exam Rhythm:Regular Rate:Normal     Neuro/Psych Dementia negative neurological ROS     GI/Hepatic GERD  ,(+) Hepatitis -, C  Endo/Other    Renal/GU      Musculoskeletal   Abdominal   Peds  Hematology  (+) anemia , HIV,   Anesthesia Other Findings   Reproductive/Obstetrics                          Lab Results  Component Value Date   CREATININE 1.30 (H) 04/30/2017   BUN 41 (H) 04/30/2017   NA 135 04/30/2017   K 5.4 (H) 04/30/2017   CL 105 04/30/2017   CO2 23 04/30/2017    Lab Results  Component Value Date   WBC 2.7 (L) 04/30/2017   HGB 12.3 (L) 04/30/2017   HCT 37.0 (L) 04/30/2017   MCV 88.5 04/30/2017   PLT 154 04/30/2017    Anesthesia Physical Anesthesia Plan  ASA: III  Anesthesia Plan: Regional and General   Post-op Pain Management: GA combined w/ Regional for post-op pain   Induction: Intravenous  PONV Risk Score and Plan: Treatment may vary due to age or medical condition  Airway Management Planned: LMA  Additional Equipment:   Intra-op Plan:   Post-operative Plan:   Informed Consent: I have reviewed the patients History and Physical, chart, labs and discussed the procedure including the risks, benefits and alternatives for the proposed anesthesia with the patient or authorized representative who has indicated his/her understanding and acceptance.     Plan Discussed with:  CRNA  Anesthesia Plan Comments:        Anesthesia Quick Evaluation

## 2017-05-04 ENCOUNTER — Encounter (HOSPITAL_COMMUNITY): Payer: Self-pay | Admitting: *Deleted

## 2017-05-04 ENCOUNTER — Inpatient Hospital Stay (HOSPITAL_COMMUNITY)
Admission: RE | Admit: 2017-05-04 | Discharge: 2017-05-07 | DRG: 465 | Disposition: A | Discharge status: Skilled Nursing Facility | Payer: Medicare Other | Source: Ambulatory Visit | Attending: Orthopedic Surgery | Admitting: Orthopedic Surgery

## 2017-05-04 ENCOUNTER — Inpatient Hospital Stay (HOSPITAL_COMMUNITY): Payer: Medicare Other | Admitting: Anesthesiology

## 2017-05-04 ENCOUNTER — Other Ambulatory Visit: Payer: Self-pay

## 2017-05-04 ENCOUNTER — Encounter (HOSPITAL_COMMUNITY)
Admission: RE | Disposition: A | Discharge status: Skilled Nursing Facility | Payer: Self-pay | Source: Ambulatory Visit | Attending: Orthopedic Surgery

## 2017-05-04 DIAGNOSIS — J410 Simple chronic bronchitis: Secondary | ICD-10-CM | POA: Diagnosis not present

## 2017-05-04 DIAGNOSIS — Z885 Allergy status to narcotic agent status: Secondary | ICD-10-CM

## 2017-05-04 DIAGNOSIS — K7460 Unspecified cirrhosis of liver: Secondary | ICD-10-CM | POA: Diagnosis present

## 2017-05-04 DIAGNOSIS — K429 Umbilical hernia without obstruction or gangrene: Secondary | ICD-10-CM | POA: Diagnosis not present

## 2017-05-04 DIAGNOSIS — Z7982 Long term (current) use of aspirin: Secondary | ICD-10-CM | POA: Diagnosis not present

## 2017-05-04 DIAGNOSIS — Z96652 Presence of left artificial knee joint: Secondary | ICD-10-CM | POA: Diagnosis present

## 2017-05-04 DIAGNOSIS — R188 Other ascites: Secondary | ICD-10-CM | POA: Diagnosis not present

## 2017-05-04 DIAGNOSIS — Y831 Surgical operation with implant of artificial internal device as the cause of abnormal reaction of the patient, or of later complication, without mention of misadventure at the time of the procedure: Secondary | ICD-10-CM | POA: Diagnosis present

## 2017-05-04 DIAGNOSIS — Z7951 Long term (current) use of inhaled steroids: Secondary | ICD-10-CM | POA: Diagnosis not present

## 2017-05-04 DIAGNOSIS — M009 Pyogenic arthritis, unspecified: Secondary | ICD-10-CM | POA: Diagnosis not present

## 2017-05-04 DIAGNOSIS — Z7984 Long term (current) use of oral hypoglycemic drugs: Secondary | ICD-10-CM | POA: Diagnosis not present

## 2017-05-04 DIAGNOSIS — Z79899 Other long term (current) drug therapy: Secondary | ICD-10-CM

## 2017-05-04 DIAGNOSIS — Z9981 Dependence on supplemental oxygen: Secondary | ICD-10-CM | POA: Diagnosis not present

## 2017-05-04 DIAGNOSIS — Z21 Asymptomatic human immunodeficiency virus [HIV] infection status: Secondary | ICD-10-CM | POA: Diagnosis not present

## 2017-05-04 DIAGNOSIS — A539 Syphilis, unspecified: Secondary | ICD-10-CM | POA: Diagnosis not present

## 2017-05-04 DIAGNOSIS — G8918 Other acute postprocedural pain: Secondary | ICD-10-CM | POA: Diagnosis not present

## 2017-05-04 DIAGNOSIS — I10 Essential (primary) hypertension: Secondary | ICD-10-CM | POA: Diagnosis not present

## 2017-05-04 DIAGNOSIS — R6520 Severe sepsis without septic shock: Secondary | ICD-10-CM | POA: Diagnosis not present

## 2017-05-04 DIAGNOSIS — L03116 Cellulitis of left lower limb: Secondary | ICD-10-CM | POA: Diagnosis not present

## 2017-05-04 DIAGNOSIS — F0390 Unspecified dementia without behavioral disturbance: Secondary | ICD-10-CM | POA: Diagnosis present

## 2017-05-04 DIAGNOSIS — E785 Hyperlipidemia, unspecified: Secondary | ICD-10-CM | POA: Diagnosis present

## 2017-05-04 DIAGNOSIS — R262 Difficulty in walking, not elsewhere classified: Secondary | ICD-10-CM | POA: Diagnosis not present

## 2017-05-04 DIAGNOSIS — K219 Gastro-esophageal reflux disease without esophagitis: Secondary | ICD-10-CM | POA: Diagnosis present

## 2017-05-04 DIAGNOSIS — D649 Anemia, unspecified: Secondary | ICD-10-CM | POA: Diagnosis present

## 2017-05-04 DIAGNOSIS — R945 Abnormal results of liver function studies: Secondary | ICD-10-CM | POA: Diagnosis not present

## 2017-05-04 DIAGNOSIS — M1990 Unspecified osteoarthritis, unspecified site: Secondary | ICD-10-CM | POA: Diagnosis not present

## 2017-05-04 DIAGNOSIS — M109 Gout, unspecified: Secondary | ICD-10-CM | POA: Diagnosis not present

## 2017-05-04 DIAGNOSIS — B182 Chronic viral hepatitis C: Secondary | ICD-10-CM | POA: Diagnosis present

## 2017-05-04 DIAGNOSIS — T8454XA Infection and inflammatory reaction due to internal left knee prosthesis, initial encounter: Principal | ICD-10-CM | POA: Diagnosis present

## 2017-05-04 DIAGNOSIS — A6000 Herpesviral infection of urogenital system, unspecified: Secondary | ICD-10-CM | POA: Diagnosis not present

## 2017-05-04 DIAGNOSIS — N400 Enlarged prostate without lower urinary tract symptoms: Secondary | ICD-10-CM | POA: Diagnosis not present

## 2017-05-04 DIAGNOSIS — M6281 Muscle weakness (generalized): Secondary | ICD-10-CM | POA: Diagnosis not present

## 2017-05-04 DIAGNOSIS — N10 Acute tubulo-interstitial nephritis: Secondary | ICD-10-CM | POA: Diagnosis not present

## 2017-05-04 DIAGNOSIS — B029 Zoster without complications: Secondary | ICD-10-CM | POA: Diagnosis not present

## 2017-05-04 DIAGNOSIS — F17210 Nicotine dependence, cigarettes, uncomplicated: Secondary | ICD-10-CM | POA: Diagnosis not present

## 2017-05-04 DIAGNOSIS — J449 Chronic obstructive pulmonary disease, unspecified: Secondary | ICD-10-CM | POA: Diagnosis not present

## 2017-05-04 DIAGNOSIS — K5900 Constipation, unspecified: Secondary | ICD-10-CM | POA: Diagnosis not present

## 2017-05-04 DIAGNOSIS — B079 Viral wart, unspecified: Secondary | ICD-10-CM | POA: Diagnosis not present

## 2017-05-04 DIAGNOSIS — N401 Enlarged prostate with lower urinary tract symptoms: Secondary | ICD-10-CM | POA: Diagnosis not present

## 2017-05-04 DIAGNOSIS — M79609 Pain in unspecified limb: Secondary | ICD-10-CM | POA: Diagnosis not present

## 2017-05-04 HISTORY — PX: EXCISIONAL TOTAL KNEE ARTHROPLASTY: SHX5015

## 2017-05-04 LAB — TYPE AND SCREEN
ABO/RH(D): O POS
ANTIBODY SCREEN: NEGATIVE

## 2017-05-04 LAB — POTASSIUM: POTASSIUM: 5.3 mmol/L — AB (ref 3.5–5.1)

## 2017-05-04 SURGERY — EXCISIONAL TOTAL KNEE ARTHROPLASTY
Anesthesia: Regional | Site: Knee | Laterality: Left

## 2017-05-04 MED ORDER — METHOCARBAMOL 500 MG PO TABS
500.0000 mg | ORAL_TABLET | Freq: Four times a day (QID) | ORAL | Status: DC | PRN
  Administered 2017-05-05 – 2017-05-06 (×3): 500 mg via ORAL
  Filled 2017-05-04 (×3): qty 1

## 2017-05-04 MED ORDER — BUDESONIDE 0.25 MG/2ML IN SUSP
0.2500 mg | Freq: Two times a day (BID) | RESPIRATORY_TRACT | Status: DC
  Administered 2017-05-04 – 2017-05-07 (×6): 0.25 mg via RESPIRATORY_TRACT
  Filled 2017-05-04 (×7): qty 2

## 2017-05-04 MED ORDER — EMTRICITABINE-TENOFOVIR AF 200-25 MG PO TABS
1.0000 | ORAL_TABLET | Freq: Every day | ORAL | Status: DC
  Administered 2017-05-05 – 2017-05-07 (×3): 1 via ORAL
  Filled 2017-05-04 (×3): qty 1

## 2017-05-04 MED ORDER — MIDAZOLAM HCL 2 MG/2ML IJ SOLN
1.0000 mg | INTRAMUSCULAR | Status: DC
  Filled 2017-05-04: qty 2

## 2017-05-04 MED ORDER — FENTANYL CITRATE (PF) 100 MCG/2ML IJ SOLN
INTRAMUSCULAR | Status: DC | PRN
  Administered 2017-05-04: 50 ug via INTRAVENOUS
  Administered 2017-05-04 (×2): 25 ug via INTRAVENOUS

## 2017-05-04 MED ORDER — DEXAMETHASONE SODIUM PHOSPHATE 10 MG/ML IJ SOLN
INTRAMUSCULAR | Status: AC
  Filled 2017-05-04: qty 1

## 2017-05-04 MED ORDER — PROPOFOL 10 MG/ML IV BOLUS
INTRAVENOUS | Status: AC
  Filled 2017-05-04: qty 40

## 2017-05-04 MED ORDER — DOCUSATE SODIUM 100 MG PO CAPS
100.0000 mg | ORAL_CAPSULE | Freq: Two times a day (BID) | ORAL | Status: DC
  Administered 2017-05-04 – 2017-05-07 (×6): 100 mg via ORAL
  Filled 2017-05-04 (×7): qty 1

## 2017-05-04 MED ORDER — METHOCARBAMOL 1000 MG/10ML IJ SOLN
500.0000 mg | Freq: Four times a day (QID) | INTRAVENOUS | Status: DC | PRN
  Administered 2017-05-04: 500 mg via INTRAVENOUS
  Filled 2017-05-04: qty 550

## 2017-05-04 MED ORDER — LIDOCAINE 2% (20 MG/ML) 5 ML SYRINGE
INTRAMUSCULAR | Status: DC | PRN
  Administered 2017-05-04: 100 mg via INTRAVENOUS

## 2017-05-04 MED ORDER — PROPOFOL 10 MG/ML IV BOLUS
INTRAVENOUS | Status: DC | PRN
  Administered 2017-05-04: 150 mg via INTRAVENOUS

## 2017-05-04 MED ORDER — METOCLOPRAMIDE HCL 5 MG PO TABS: 5.0000 mg | ORAL_TABLET | Freq: Three times a day (TID) | ORAL | Status: DC | PRN

## 2017-05-04 MED ORDER — DEXAMETHASONE SODIUM PHOSPHATE 10 MG/ML IJ SOLN
10.0000 mg | Freq: Once | INTRAMUSCULAR | Status: AC
  Administered 2017-05-04: 10 mg via INTRAVENOUS

## 2017-05-04 MED ORDER — EPHEDRINE 5 MG/ML INJ
INTRAVENOUS | Status: AC
  Filled 2017-05-04: qty 10

## 2017-05-04 MED ORDER — FAMOTIDINE 20 MG PO TABS
20.0000 mg | ORAL_TABLET | Freq: Every day | ORAL | Status: DC
  Administered 2017-05-04 – 2017-05-06 (×3): 20 mg via ORAL
  Filled 2017-05-04 (×3): qty 1

## 2017-05-04 MED ORDER — ONDANSETRON HCL 4 MG PO TABS
4.0000 mg | ORAL_TABLET | Freq: Four times a day (QID) | ORAL | Status: DC | PRN
  Administered 2017-05-07: 11:00:00 4 mg via ORAL
  Filled 2017-05-04: qty 1

## 2017-05-04 MED ORDER — RALTEGRAVIR POTASSIUM 400 MG PO TABS
400.0000 mg | ORAL_TABLET | Freq: Two times a day (BID) | ORAL | Status: DC
  Administered 2017-05-05 – 2017-05-07 (×5): 400 mg via ORAL
  Filled 2017-05-04 (×5): qty 1

## 2017-05-04 MED ORDER — PROMETHAZINE HCL 25 MG/ML IJ SOLN: 6.2500 mg | INTRAMUSCULAR | Status: DC | PRN

## 2017-05-04 MED ORDER — LACTATED RINGERS IV SOLN: INTRAVENOUS | Status: DC

## 2017-05-04 MED ORDER — ONDANSETRON HCL 4 MG/2ML IJ SOLN
INTRAMUSCULAR | Status: AC
  Filled 2017-05-04: qty 2

## 2017-05-04 MED ORDER — HYDROMORPHONE HCL 1 MG/ML IJ SOLN: 0.5000 mg | INTRAMUSCULAR | Status: DC | PRN

## 2017-05-04 MED ORDER — METOCLOPRAMIDE HCL 5 MG/ML IJ SOLN: 5.0000 mg | Freq: Three times a day (TID) | INTRAMUSCULAR | Status: DC | PRN

## 2017-05-04 MED ORDER — ALBUTEROL SULFATE (2.5 MG/3ML) 0.083% IN NEBU
2.5000 mg | INHALATION_SOLUTION | Freq: Two times a day (BID) | RESPIRATORY_TRACT | Status: DC
  Administered 2017-05-04 – 2017-05-07 (×6): 2.5 mg via RESPIRATORY_TRACT
  Filled 2017-05-04 (×7): qty 3

## 2017-05-04 MED ORDER — HYDROCHLOROTHIAZIDE 25 MG PO TABS
25.0000 mg | ORAL_TABLET | Freq: Every day | ORAL | Status: DC
  Administered 2017-05-05 – 2017-05-07 (×3): 25 mg via ORAL
  Filled 2017-05-04 (×3): qty 1

## 2017-05-04 MED ORDER — HYDROCODONE-ACETAMINOPHEN 7.5-325 MG PO TABS: 1.0000 | ORAL_TABLET | Freq: Once | ORAL | Status: DC | PRN

## 2017-05-04 MED ORDER — ACETAMINOPHEN 10 MG/ML IV SOLN: 1000.0000 mg | Freq: Once | INTRAVENOUS | Status: DC | PRN

## 2017-05-04 MED ORDER — CHLORHEXIDINE GLUCONATE 4 % EX LIQD: 60.0000 mL | Freq: Once | CUTANEOUS | Status: DC

## 2017-05-04 MED ORDER — OXYCODONE HCL 5 MG PO TABS
10.0000 mg | ORAL_TABLET | ORAL | Status: DC | PRN
  Administered 2017-05-06 – 2017-05-07 (×6): 15 mg via ORAL
  Filled 2017-05-04 (×7): qty 3

## 2017-05-04 MED ORDER — CEFAZOLIN SODIUM-DEXTROSE 2-4 GM/100ML-% IV SOLN
2.0000 g | Freq: Four times a day (QID) | INTRAVENOUS | Status: AC
  Administered 2017-05-04 – 2017-05-05 (×3): 2 g via INTRAVENOUS
  Filled 2017-05-04 (×3): qty 100

## 2017-05-04 MED ORDER — ACETAMINOPHEN 325 MG PO TABS: 325.0000 mg | ORAL_TABLET | Freq: Four times a day (QID) | ORAL | Status: DC | PRN

## 2017-05-04 MED ORDER — ONDANSETRON HCL 4 MG/2ML IJ SOLN: 4.0000 mg | Freq: Four times a day (QID) | INTRAMUSCULAR | Status: DC | PRN

## 2017-05-04 MED ORDER — SULFAMETHOXAZOLE-TRIMETHOPRIM 400-80 MG PO TABS
1.0000 | ORAL_TABLET | Freq: Every day | ORAL | Status: DC
  Administered 2017-05-05 – 2017-05-07 (×3): 1 via ORAL
  Filled 2017-05-04 (×3): qty 1

## 2017-05-04 MED ORDER — MEPERIDINE HCL 50 MG/ML IJ SOLN: 6.2500 mg | INTRAMUSCULAR | Status: DC | PRN

## 2017-05-04 MED ORDER — HYDROMORPHONE HCL 1 MG/ML IJ SOLN
INTRAMUSCULAR | Status: AC
  Administered 2017-05-04: 0.5 mg via INTRAVENOUS
  Filled 2017-05-04: qty 2

## 2017-05-04 MED ORDER — STERILE WATER FOR IRRIGATION IR SOLN
Status: DC | PRN
  Administered 2017-05-04: 2000 mL

## 2017-05-04 MED ORDER — ACETAMINOPHEN 500 MG PO TABS
1000.0000 mg | ORAL_TABLET | Freq: Four times a day (QID) | ORAL | Status: AC
  Administered 2017-05-04 – 2017-05-05 (×4): 1000 mg via ORAL
  Filled 2017-05-04 (×4): qty 2

## 2017-05-04 MED ORDER — 0.9 % SODIUM CHLORIDE (POUR BTL) OPTIME
TOPICAL | Status: DC | PRN
  Administered 2017-05-04: 1000 mL

## 2017-05-04 MED ORDER — SODIUM CHLORIDE 0.9 % IR SOLN
Status: DC | PRN
  Administered 2017-05-04: 3000 mL

## 2017-05-04 MED ORDER — VALACYCLOVIR HCL 500 MG PO TABS
500.0000 mg | ORAL_TABLET | Freq: Every day | ORAL | Status: DC
  Administered 2017-05-05 – 2017-05-07 (×3): 500 mg via ORAL
  Filled 2017-05-04 (×3): qty 1

## 2017-05-04 MED ORDER — FENTANYL CITRATE (PF) 100 MCG/2ML IJ SOLN
INTRAMUSCULAR | Status: AC
  Filled 2017-05-04: qty 2

## 2017-05-04 MED ORDER — ROPIVACAINE HCL 7.5 MG/ML IJ SOLN
INTRAMUSCULAR | Status: DC | PRN
  Administered 2017-05-04: 20 mL via PERINEURAL

## 2017-05-04 MED ORDER — TAMSULOSIN HCL 0.4 MG PO CAPS
0.8000 mg | ORAL_CAPSULE | Freq: Every day | ORAL | Status: DC
  Administered 2017-05-05 – 2017-05-07 (×3): 0.8 mg via ORAL
  Filled 2017-05-04 (×3): qty 2

## 2017-05-04 MED ORDER — ACETAMINOPHEN 10 MG/ML IV SOLN
1000.0000 mg | Freq: Once | INTRAVENOUS | Status: AC
  Administered 2017-05-04: 1000 mg via INTRAVENOUS
  Filled 2017-05-04: qty 100

## 2017-05-04 MED ORDER — HYDROMORPHONE HCL 1 MG/ML IJ SOLN
0.2500 mg | INTRAMUSCULAR | Status: DC | PRN
  Administered 2017-05-04 (×3): 0.5 mg via INTRAVENOUS

## 2017-05-04 MED ORDER — OXYCODONE HCL 5 MG PO TABS
5.0000 mg | ORAL_TABLET | ORAL | Status: DC | PRN
  Administered 2017-05-04: 5 mg via ORAL
  Administered 2017-05-05 – 2017-05-06 (×6): 10 mg via ORAL
  Filled 2017-05-04: qty 1
  Filled 2017-05-04 (×5): qty 2

## 2017-05-04 MED ORDER — GABAPENTIN 300 MG PO CAPS
300.0000 mg | ORAL_CAPSULE | Freq: Once | ORAL | Status: AC
  Administered 2017-05-04: 300 mg via ORAL
  Filled 2017-05-04: qty 1

## 2017-05-04 MED ORDER — PROPOFOL 10 MG/ML IV BOLUS
INTRAVENOUS | Status: AC
  Filled 2017-05-04: qty 20

## 2017-05-04 MED ORDER — ONDANSETRON HCL 4 MG/2ML IJ SOLN
INTRAMUSCULAR | Status: DC | PRN
  Administered 2017-05-04: 4 mg via INTRAVENOUS

## 2017-05-04 MED ORDER — EPHEDRINE SULFATE-NACL 50-0.9 MG/10ML-% IV SOSY
PREFILLED_SYRINGE | INTRAVENOUS | Status: DC | PRN
  Administered 2017-05-04: 5 mg via INTRAVENOUS
  Administered 2017-05-04: 10 mg via INTRAVENOUS

## 2017-05-04 MED ORDER — SODIUM CHLORIDE 0.9 % IV SOLN
INTRAVENOUS | Status: DC
  Administered 2017-05-04: 18:00:00 via INTRAVENOUS

## 2017-05-04 MED ORDER — FENTANYL CITRATE (PF) 100 MCG/2ML IJ SOLN
50.0000 ug | INTRAMUSCULAR | Status: DC
  Administered 2017-05-04: 50 ug via INTRAVENOUS
  Filled 2017-05-04: qty 2

## 2017-05-04 MED ORDER — CEFAZOLIN SODIUM-DEXTROSE 2-4 GM/100ML-% IV SOLN
2.0000 g | INTRAVENOUS | Status: AC
  Administered 2017-05-04: 2 g via INTRAVENOUS
  Filled 2017-05-04: qty 100

## 2017-05-04 MED ORDER — ASPIRIN EC 325 MG PO TBEC
325.0000 mg | DELAYED_RELEASE_TABLET | Freq: Every day | ORAL | Status: DC
  Administered 2017-05-05 – 2017-05-07 (×3): 325 mg via ORAL
  Filled 2017-05-04 (×3): qty 1

## 2017-05-04 MED ORDER — LACTATED RINGERS IV SOLN
INTRAVENOUS | Status: DC
  Administered 2017-05-04 (×2): via INTRAVENOUS

## 2017-05-04 SURGICAL SUPPLY — 53 items
BAG SPEC THK2 15X12 ZIP CLS (MISCELLANEOUS) ×1
BAG ZIPLOCK 12X15 (MISCELLANEOUS) ×3 IMPLANT
BANDAGE ACE 6X5 VEL STRL LF (GAUZE/BANDAGES/DRESSINGS) ×3 IMPLANT
BLADE SAG 18X100X1.27 (BLADE) ×3 IMPLANT
BLADE SAW SGTL 11.0X1.19X90.0M (BLADE) ×3 IMPLANT
BONE CEMENT GENTAMICIN (Cement) ×6 IMPLANT
CEMENT BONE GENTAMICIN 40 (Cement) ×3 IMPLANT
CLOTH BEACON ORANGE TIMEOUT ST (SAFETY) ×3 IMPLANT
COVER SURGICAL LIGHT HANDLE (MISCELLANEOUS) ×3 IMPLANT
CUFF TOURN SGL QUICK 34 (TOURNIQUET CUFF) ×3
CUFF TRNQT CYL 34X4X40X1 (TOURNIQUET CUFF) ×1 IMPLANT
DRAPE U-SHAPE 47X51 STRL (DRAPES) ×3 IMPLANT
DRSG ADAPTIC 3X8 NADH LF (GAUZE/BANDAGES/DRESSINGS) ×3 IMPLANT
DURAPREP 26ML APPLICATOR (WOUND CARE) ×3 IMPLANT
ELECT REM PT RETURN 15FT ADLT (MISCELLANEOUS) ×3 IMPLANT
EVACUATOR 1/8 PVC DRAIN (DRAIN) ×3 IMPLANT
FEMUR SIGMA PS SZ 4.0 L (Knees) ×3 IMPLANT
GAUZE SPONGE 4X4 12PLY STRL (GAUZE/BANDAGES/DRESSINGS) ×3 IMPLANT
GLOVE BIO SURGEON STRL SZ7.5 (GLOVE) ×3 IMPLANT
GLOVE BIO SURGEON STRL SZ8 (GLOVE) ×3 IMPLANT
GLOVE BIOGEL PI IND STRL 7.5 (GLOVE) IMPLANT
GLOVE BIOGEL PI IND STRL 8 (GLOVE) ×3 IMPLANT
GLOVE BIOGEL PI INDICATOR 7.5 (GLOVE) ×8
GLOVE BIOGEL PI INDICATOR 8 (GLOVE) ×4
GLOVE ECLIPSE 7.5 STRL STRAW (GLOVE) ×4 IMPLANT
GLOVE SURG SS PI 7.5 STRL IVOR (GLOVE) ×3 IMPLANT
GOWN SPEC L3 XXLG W/TWL (GOWN DISPOSABLE) ×3 IMPLANT
GOWN STRL REUS W/TWL LRG LVL3 (GOWN DISPOSABLE) ×3 IMPLANT
GOWN STRL REUS W/TWL XL LVL3 (GOWN DISPOSABLE) ×6 IMPLANT
HANDPIECE INTERPULSE COAX TIP (DISPOSABLE) ×3
IMMOBILIZER KNEE 20 (SOFTGOODS) ×3
IMMOBILIZER KNEE 20 THIGH 36 (SOFTGOODS) ×1 IMPLANT
INSERT PFC SIG STB SZ4 17.5MM (Knees) ×3 IMPLANT
MANIFOLD NEPTUNE II (INSTRUMENTS) ×3 IMPLANT
PACK TOTAL KNEE CUSTOM (KITS) ×3 IMPLANT
PAD ABD 8X10 STRL (GAUZE/BANDAGES/DRESSINGS) ×2 IMPLANT
PADDING CAST COTTON 6X4 STRL (CAST SUPPLIES) ×9 IMPLANT
POSITIONER SURGICAL ARM (MISCELLANEOUS) ×3 IMPLANT
SET HNDPC FAN SPRY TIP SCT (DISPOSABLE) ×1 IMPLANT
STAPLER VISISTAT 35W (STAPLE) ×3 IMPLANT
SUT PDS AB 1 CT1 27 (SUTURE) ×9 IMPLANT
SUT STRATAFIX 0 PDS 27 VIOLET (SUTURE) ×3
SUT STRATAFIX PDO 1 14 VIOLET (SUTURE)
SUT STRATFX PDO 1 14 VIOLET (SUTURE)
SUT VIC AB 2-0 CT1 27 (SUTURE) ×9
SUT VIC AB 2-0 CT1 TAPERPNT 27 (SUTURE) ×3 IMPLANT
SUTURE STRATFX 0 PDS 27 VIOLET (SUTURE) ×1 IMPLANT
SUTURE STRATFX PDO 1 14 VIOLET (SUTURE) IMPLANT
SWAB COLLECTION DEVICE MRSA (MISCELLANEOUS) ×3 IMPLANT
SWAB CULTURE ESWAB REG 1ML (MISCELLANEOUS) ×3 IMPLANT
TOWER CARTRIDGE SMART MIX (DISPOSABLE) ×3 IMPLANT
TRAY FOLEY W/METER SILVER 16FR (SET/KITS/TRAYS/PACK) ×3 IMPLANT
WRAP KNEE MAXI GEL POST OP (GAUZE/BANDAGES/DRESSINGS) ×4 IMPLANT

## 2017-05-04 NOTE — Progress Notes (Signed)
Pt wheelchair transported to room 1309.

## 2017-05-04 NOTE — Anesthesia Postprocedure Evaluation (Signed)
Anesthesia Post Note  Patient: Logan Levine  Procedure(s) Performed: LEFT KNEE RESECTION ARTHROPLASTY (Left Knee)     Patient location during evaluation: PACU Anesthesia Type: Regional Level of consciousness: awake and alert Pain management: pain level controlled Vital Signs Assessment: post-procedure vital signs reviewed and stable Respiratory status: spontaneous breathing, nonlabored ventilation, respiratory function stable and patient connected to nasal cannula oxygen Cardiovascular status: blood pressure returned to baseline and stable Postop Assessment: no apparent nausea or vomiting Anesthetic complications: no    Last Vitals:  Vitals:   05/04/17 1657 05/04/17 1700  BP: 113/69 113/69  Pulse: 67 67  Resp: 12 14  Temp: 36.4 C 36.4 C  SpO2: 95% 95%    Last Pain:  Vitals:   05/04/17 1700  TempSrc: Oral  PainSc: 2                  Barnet Glasgow

## 2017-05-04 NOTE — Anesthesia Procedure Notes (Signed)
Anesthesia Regional Block: Adductor canal block   Pre-Anesthetic Checklist: ,, timeout performed, Correct Patient, Correct Site, Correct Laterality, Correct Procedure, Correct Position, site marked, Risks and benefits discussed,  Surgical consent,  Pre-op evaluation,  At surgeon's request and post-op pain management  Laterality: Left  Prep: chloraprep       Needles:  Injection technique: Single-shot  Needle Type: Echogenic Needle     Needle Length: 9cm  Needle Gauge: 21     Additional Needles:   Procedures:,,,, ultrasound used (permanent image in chart),,,,  Narrative:  Start time: 05/04/2017 12:32 PM End time: 05/04/2017 12:37 PM Injection made incrementally with aspirations every 5 mL.  Performed by: Personally  Anesthesiologist: Barnet Glasgow, MD

## 2017-05-04 NOTE — Transfer of Care (Signed)
Immediate Anesthesia Transfer of Care Note  Patient: Logan Levine  Procedure(s) Performed: LEFT KNEE RESECTION ARTHROPLASTY (Left Knee)  Patient Location: PACU  Anesthesia Type:General  Level of Consciousness: sedated  Airway & Oxygen Therapy: Patient Spontanous Breathing and Patient connected to face mask oxygen  Post-op Assessment: Report given to RN and Post -op Vital signs reviewed and stable  Post vital signs: Reviewed and stable  Last Vitals:  Vitals Value Taken Time  BP 120/94 05/04/2017  4:03 PM  Temp    Pulse 70 05/04/2017  4:04 PM  Resp 11 05/04/2017  4:04 PM  SpO2 100 % 05/04/2017  4:04 PM  Vitals shown include unvalidated device data.  Last Pain:  Vitals:   05/04/17 1040  TempSrc: Oral      Patients Stated Pain Goal: 3 (29/47/65 4650)  Complications: No apparent anesthesia complications

## 2017-05-04 NOTE — Progress Notes (Signed)
AssistedDr. Houser with left, ultrasound guided, knee, adductor canal block. Side rails up, monitors on throughout procedure. See vital signs in flow sheet. Tolerated Procedure well.  

## 2017-05-04 NOTE — Brief Op Note (Signed)
05/04/2017  3:41 PM  PATIENT:  Logan Levine  67 y.o. male  PRE-OPERATIVE DIAGNOSIS:  Left Total Knee Arthroplasty infection  POST-OPERATIVE DIAGNOSIS:  Left Total Knee Arthroplasty infection  PROCEDURE:  Procedure(s) with comments: LEFT KNEE RESECTION ARTHROPLASTY (Left) - Adductor Block  SURGEON:  Surgeon(s) and Role:    Gaynelle Arabian, MD - Primary  PHYSICIAN ASSISTANT:   ASSISTANTS: Arlee Muslim, PA-C   ANESTHESIA:   regional and general  EBL:  50 mL   BLOOD ADMINISTERED:none  DRAINS: none   LOCAL MEDICATIONS USED:  NONE  COUNTS:  YES  TOURNIQUET:   Total Tourniquet Time Documented: Thigh (Left) - 48 minutes Total: Thigh (Left) - 48 minutes   DICTATION: .Other Dictation: Dictation Number [id number]  PLAN OF CARE: Admit to inpatient   PATIENT DISPOSITION:  PACU - hemodynamically stable.
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
## 2017-05-04 NOTE — Anesthesia Procedure Notes (Signed)
Procedure Name: LMA Insertion Date/Time: 05/04/2017 2:08 PM Performed by: Talbot Grumbling, CRNA Pre-anesthesia Checklist: Patient identified, Emergency Drugs available, Suction available and Patient being monitored Patient Re-evaluated:Patient Re-evaluated prior to induction Oxygen Delivery Method: Circle system utilized Preoxygenation: Pre-oxygenation with 100% oxygen Ventilation: Mask ventilation without difficulty LMA: LMA inserted LMA Size: 4.0 Number of attempts: 1 Placement Confirmation: positive ETCO2 and breath sounds checked- equal and bilateral Tube secured with: Tape Dental Injury: Teeth and Oropharynx as per pre-operative assessment

## 2017-05-04 NOTE — Plan of Care (Signed)
Plan of care 

## 2017-05-05 ENCOUNTER — Encounter (HOSPITAL_COMMUNITY): Payer: Self-pay | Admitting: Orthopedic Surgery

## 2017-05-05 DIAGNOSIS — Z885 Allergy status to narcotic agent status: Secondary | ICD-10-CM

## 2017-05-05 DIAGNOSIS — F17210 Nicotine dependence, cigarettes, uncomplicated: Secondary | ICD-10-CM

## 2017-05-05 DIAGNOSIS — T8454XA Infection and inflammatory reaction due to internal left knee prosthesis, initial encounter: Principal | ICD-10-CM

## 2017-05-05 DIAGNOSIS — M009 Pyogenic arthritis, unspecified: Secondary | ICD-10-CM

## 2017-05-05 MED ORDER — VANCOMYCIN HCL 10 G IV SOLR
1250.0000 mg | INTRAVENOUS | Status: DC
  Administered 2017-05-06: 17:00:00 1250 mg via INTRAVENOUS
  Filled 2017-05-05 (×2): qty 1250

## 2017-05-05 MED ORDER — VANCOMYCIN HCL 10 G IV SOLR
1500.0000 mg | INTRAVENOUS | Status: AC
  Administered 2017-05-05: 17:00:00 1500 mg via INTRAVENOUS
  Filled 2017-05-05: qty 1500

## 2017-05-05 MED ORDER — LIP MEDEX EX OINT
TOPICAL_OINTMENT | CUTANEOUS | Status: AC
  Administered 2017-05-05: 18:00:00
  Filled 2017-05-05: qty 7

## 2017-05-05 NOTE — Op Note (Signed)
NAME:  Logan Levine, Logan Levine                   ACCOUNT NO.:  MEDICAL RECORD NO.:  [id number]  LOCATION:                                 FACILITY:  PHYSICIAN:  Gaynelle Arabian, M.D.    DATE OF BIRTH:  03/08/1950  DATE OF PROCEDURE:  05/04/2017 DATE OF DISCHARGE:                              OPERATIVE REPORT   PREOPERATIVE DIAGNOSIS:  Infected left total knee arthroplasty.  POSTOPERATIVE DIAGNOSIS:  Infected left total knee arthroplasty.  PROCEDURE:  Left knee resection arthroplasty with antibiotic spacer.  SURGEON:  Gaynelle Arabian, M.D.  ASSISTANT:  Alexzandrew L. Perkins, PA-C.  ANESTHESIA:  General with adductor canal block.  ESTIMATED BLOOD LOSS:  Minimal.  DRAINS:  Hemovac x1.  COMPLICATIONS:  None.  CONDITION:  Stable to recovery.  BRIEF CLINICAL NOTE:  Mr. Logan Levine is a 67 year old male, presented with an infected left total knee arthroplasty.  He had a revision procedure done at last fall and developed an infection.  It was treated with irrigation and debridement.  He was referred to me for further evaluation.  He presented with a septic knee and presents now for resection arthroplasty and antibiotic spacer.  PROCEDURE IN DETAIL:  After successful administration of adductor canal block and general anesthetic, a tourniquet was placed high on the left thigh and left lower extremity, prepped and draped in the usual sterile fashion.  Extremities wrapped in Esmarch and tourniquet inflated to 300 mmHg.  Midline incision was made with a 10-blade through the subcutaneous tissue to the level of the extensor mechanism.  A fresh blade was used to make a medial parapatellar arthrotomy.  The fluid was sent for culture; both aerobic, anaerobic, Gram stain, culture and sensitivity.  The soft tissue on the proximal medial tibia subperiosteally elevated to the joint space with a 10-blade into the semimembranosus bursa with a Cobb elevator.  Soft tissue laterally was elevated with  attention being paid to avoiding the patellar tendon on the tibial tubercle.  Patella was everted, knee flexed 90 degrees.  The femur was grossly loose.  The femoral component is moving and I essentially picked the femoral component off with my fingers without having to disrupt any of the interface between the bone and the prosthesis.  We subluxed the tibia anteriorly and removed the tibial polyethylene from the tray.  An oscillating saw was then used to disrupt the interface between the tibial component and bone, and the tibial component was easily removed.  We then removed any abnormal tissue first from the cut tibial surface. On the femoral side, there was a large amount of fibrinous tissue present on the bone, which was in the space between the prosthesis and bone.  I removed all of this to get back to normal-appearing bone.  It appears that the size 4 would be the most appropriate femoral size.  I prepared the femur for a size 4 posterior stabilized femur.  There was a lot of bone loss medially and distal, but I did not want to place any augments as this was just going to be a temporary spacer.  On the tibial side, size 4 was also most appropriate.  The wound was  subsequently irrigated with about 3 L of saline using pulsatile lavage.  The cement was then mixed, which was 3 batches of gentamicin impregnated cement and once ready for implantation, the size 3 posterior stabilized [employer] femoral component was cemented onto the distal femur and a 15-mm insert was cemented into the proximal tibia.  There was excellent balance in full extension.  Once the cement was fully hardened, then the wound was further irrigated with saline solution.  The arthrotomy was then closed over Hemovac drain with a running #1 V-Loc suture.  Flexion against gravity was about 95 degrees.  Subcu was then closed with interrupted 2-0 Vicryl and skin closed with staples.  Incision was cleaned  and dried, and a bulky sterile dressing applied.  He was then awakened and transported to recovery in stable condition.     Gaynelle Arabian, M.D.     FA/MEDQ  D:  05/04/2017  T:  05/05/2017  Job:  [id number]

## 2017-05-05 NOTE — Progress Notes (Signed)
[facility] Infusion Coordinator will follow pt with ID team to support home infusion pharmacy services for home IV ABX at DC.  [facility] can support pt for [facility] services based on pts [facility] choice.  If patient discharges after hours, please call [phone].   Larry Sierras 05/05/2017, 4:34 PM

## 2017-05-05 NOTE — Progress Notes (Signed)
Physical Therapy Treatment Patient Details Name: Logan Levine MRN: [id number] DOB: 1950/02/08 Today's Date: 05/05/2017    History of Present Illness Pt is a 67 year old male admitted for left knee resection arthroplasty with antibiotic spacer    PT Comments    Pt assisted with ambulating again this afternoon.  Pt assisted with donning KI with max verbal cues.  Pt also educated on LE exercises and further explained no ROM to L knee.   Follow Up Recommendations  SNF     Equipment Recommendations  None recommended by PT    Recommendations for Other Services       Precautions / Restrictions Precautions Precautions: Knee;Fall Precaution Comments: No ROM  Required Braces or Orthoses: Knee Immobilizer - Left Restrictions Other Position/Activity Restrictions: WBAT    Mobility  Bed Mobility               General bed mobility comments: pt up in recliner   Transfers Overall transfer level: Needs assistance Equipment used: Rolling walker (2 wheeled) Transfers: Sit to/from Stand Sit to Stand: Min guard         General transfer comment: verbal cues for UE and LE positioning, increased time  Ambulation/Gait Ambulation/Gait assistance: Min guard Ambulation Distance (Feet): 80 Feet Assistive device: Rolling walker (2 wheeled) Gait Pattern/deviations: Step-to pattern;Decreased stance time - left;Antalgic     General Gait Details: verbal cues for sequence, RW positioning, posture, slow speed however improved since this morning   Stairs            Wheelchair Mobility    Modified Rankin (Stroke Patients Only)       Balance                                            Cognition Arousal/Alertness: Awake/alert Behavior During Therapy: WFL for tasks assessed/performed Overall Cognitive Status: Within Functional Limits for tasks assessed                                        Exercises Total Joint Exercises Ankle  Circles/Pumps: AROM;Both;10 reps Quad Sets: AROM;10 reps;Both Hip ABduction/ADduction: AROM;10 reps;Left    General Comments        Pertinent Vitals/Pain Pain Assessment: 0-10 Pain Score: 3  Pain Location: L knee Pain Descriptors / Indicators: Sore;Aching Pain Intervention(s): Limited activity within patient's tolerance;Repositioned;Monitored during session;Premedicated before session    Home Living                      Prior Function            PT Goals (current goals can now be found in the care plan section) Progress towards PT goals: Progressing toward goals    Frequency    7X/week      PT Plan Current plan remains appropriate    Co-evaluation              AM-PAC PT "6 Clicks" Daily Activity  Outcome Measure  Difficulty turning over in bed (including adjusting bedclothes, sheets and blankets)?: A Lot Difficulty moving from lying on back to sitting on the side of the bed? : Unable Difficulty sitting down on and standing up from a chair with arms (e.g., wheelchair, bedside commode, etc,.)?: Unable Help needed moving to and from  a bed to chair (including a wheelchair)?: A Little Help needed walking in hospital room?: A Little Help needed climbing 3-5 steps with a railing? : A Lot 6 Click Score: 12    End of Session Equipment Utilized During Treatment: Gait belt;Left knee immobilizer Activity Tolerance: Patient tolerated treatment well Patient left: with call bell/phone within reach;in chair;with chair alarm set   PT Visit Diagnosis: Other abnormalities of gait and mobility (R26.89)     Time: 4709-6283 PT Time Calculation (min) (ACUTE ONLY): 19 min  Charges:  $Gait Training: 8-22 mins                    G Codes:       Carmelia Bake, PT, DPT 05/05/2017 Pager: [phone]  [name] 05/05/2017, 4:01 PM
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
## 2017-05-05 NOTE — Progress Notes (Signed)
Pharmacy Antibiotic Note  Logan Levine is a 67 y.o. male admitted on 05/04/2017 with prosthetic joint infection  PMH significant for chronic hepatitis C, HIV, HTN, COPD.  Past history also significant for TKA revision which resulted in infection and treated with I&D months ago.  On 4/1, underwent left knee resection arthroplasty and antibiotic spacer.  Pharmacy has been consulted for Vancomycin dosing.  Plan:  Vancomycin 1500mg  IV x 1 loading dose followed by 1250mg  IV q24h (to provide goal AUC 400-500)  F/U culture results/sensitivities  Follow renal function  Check vancomycin levels as appropriate  Height: 5\' 9"  (175.3 cm) Weight: 168 lb (76.2 kg) IBW/kg (Calculated) : 70.7  Temp (24hrs), Avg:98.1 F (36.7 C), Min:97.6 F (36.4 C), Max:98.7 F (37.1 C)  Recent Labs  Lab 04/30/17 0930  WBC 2.7*  CREATININE 1.30*    Estimated Creatinine Clearance: 55.9 mL/min (A) (by C-G formula based on SCr of 1.3 mg/dL (H)).    Allergies  Allergen Reactions  . Other Other (See Comments)    Opiates-"makes me crazy"    Antimicrobials this admission: 4/1 Cefazolin >> 4/2 4/2 Vanc >>    Dose adjustments this admission:    Microbiology results: 4/1 Wound: sent  3/28 MRSA PCR: negative  Thank you for allowing pharmacy to be a part of this patient's care.  Everette Rank, PharmD 05/05/2017 4:02 PM

## 2017-05-05 NOTE — Evaluation (Signed)
Physical Therapy Evaluation Patient Details Name: Logan Levine MRN: [id number] DOB: [birth year] Today's Date: 05/05/2017   History of Present Illness  Pt is a 67 year old male admitted for left knee resection arthroplasty with antibiotic spacer  Clinical Impression  Patient is s/p above surgery resulting in functional limitations due to the deficits listed below (see PT Problem List).  Patient will benefit from skilled PT to increase their independence and safety with mobility to allow discharge to the venue listed below.  Pt reports no assist available at home.  Pt educated on [place] and assisted with ambulating in hallway.  Pt has w/c for home however reports it does not fit into certain area such as his bathroom.  Pt also reports he is unable to read.  Recommend SNF upon d/c at this time.       Follow Up Recommendations SNF    Equipment Recommendations  None recommended by PT    Recommendations for Other Services       Precautions / Restrictions Precautions Precautions: Knee;Fall Precaution Comments: No ROM  Required Braces or Orthoses: Knee Immobilizer - Left Restrictions Other Position/Activity Restrictions: WBAT      Mobility  Bed Mobility Overal bed mobility: Needs Assistance Bed Mobility: Supine to Sit     Supine to sit: Min assist     General bed mobility comments: verbal cues for safe technique, assist required for L LE  Transfers Overall transfer level: Needs assistance Equipment used: Rolling walker (2 wheeled) Transfers: Sit to/from Stand Sit to Stand: Min assist         General transfer comment: verbal cues for UE and LE positioning, assist to steady with rise  Ambulation/Gait Ambulation/Gait assistance: Min assist Ambulation Distance (Feet): 80 Feet Assistive device: Rolling walker (2 wheeled) Gait Pattern/deviations: Step-to pattern;Decreased stance time - left;Antalgic     General Gait Details: verbal cues for sequence, RW positioning, posture,  slow speed  Stairs            Wheelchair Mobility    Modified Rankin (Stroke Patients Only)       Balance                                             Pertinent Vitals/Pain Pain Assessment: 0-10 Pain Score: 3  Pain Location: L knee Pain Descriptors / Indicators: Sore;Aching Pain Intervention(s): Limited activity within patient's tolerance;Repositioned;Monitored during session;Ice applied    Home Living Family/patient expects to be discharged to:: Private residence Living Arrangements: Alone           Home Layout: One [place]: Environmental consultant - 2 wheels;Wheelchair - manual      Prior Function Level of Independence: Independent with assistive device(s)               Hand Dominance        Extremity/Trunk Assessment        Lower Extremity Assessment Lower Extremity Assessment: LLE deficits/detail LLE Deficits / Details: maintained KI, pt able to perform ankle pumps, no ROM to knee, requires assist to move leg for bed mobility       Communication   Communication: Other (comment)(pt reports he cannot read)  Cognition Arousal/Alertness: Awake/alert Behavior During Therapy: WFL for tasks assessed/performed Overall Cognitive Status: Within Functional Limits for tasks assessed  General Comments      Exercises     Assessment/Plan    PT Assessment Patient needs continued PT services  PT Problem List Decreased mobility;Decreased strength;Decreased activity tolerance;Decreased knowledge of precautions;Pain;Decreased knowledge of use of DME       PT Treatment Interventions Stair training;Therapeutic exercise;Gait training;Patient/family education;Therapeutic activities;DME instruction;Functional mobility training    PT Goals (Current goals can be found in the Care Plan section)  Acute Rehab PT Goals PT Goal Formulation: With patient Time For Goal Achievement:  05/12/17 Potential to Achieve Goals: Good    Frequency 7X/week   Barriers to discharge        Co-evaluation               AM-PAC PT "6 Clicks" Daily Activity  Outcome Measure Difficulty turning over in bed (including adjusting bedclothes, sheets and blankets)?: A Lot Difficulty moving from lying on back to sitting on the side of the bed? : Unable Difficulty sitting down on and standing up from a chair with arms (Levine.g., wheelchair, bedside commode, etc,.)?: Unable Help needed moving to and from a bed to chair (including a wheelchair)?: A Little Help needed walking in hospital room?: A Little Help needed climbing 3-5 steps with a railing? : A Lot 6 Click Score: 12    End of Session Equipment Utilized During Treatment: Gait belt;Left knee immobilizer Activity Tolerance: Patient tolerated treatment well Patient left: with call bell/phone within reach;in chair;with chair alarm set Nurse Communication: Mobility status PT Visit Diagnosis: Other abnormalities of gait and mobility (R26.89)    Time: 8115-7262 PT Time Calculation (min) (ACUTE ONLY): 23 min   Charges:   PT Evaluation $PT Eval Low Complexity: 1 Low     PT G CodesCarmelia Levine, PT, DPT 05/05/2017 Pager: [phone]  Logan Levine 05/05/2017, 11:50 AM
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
## 2017-05-06 ENCOUNTER — Inpatient Hospital Stay: Payer: Self-pay

## 2017-05-06 DIAGNOSIS — B182 Chronic viral hepatitis C: Secondary | ICD-10-CM

## 2017-05-06 DIAGNOSIS — Z21 Asymptomatic human immunodeficiency virus [HIV] infection status: Secondary | ICD-10-CM

## 2017-05-06 MED ORDER — SODIUM CHLORIDE 0.9% FLUSH: 10.0000 mL | INTRAVENOUS | Status: DC | PRN

## 2017-05-06 MED ORDER — SODIUM CHLORIDE 0.9% FLUSH: 10.0000 mL | Freq: Two times a day (BID) | INTRAVENOUS | Status: DC

## 2017-05-06 MED ORDER — SODIUM CHLORIDE 0.9 % IV SOLN
2.0000 g | INTRAVENOUS | Status: DC
  Administered 2017-05-06 – 2017-05-07 (×2): 2 g via INTRAVENOUS
  Filled 2017-05-06 (×2): qty 20

## 2017-05-06 NOTE — Progress Notes (Signed)
Peripherally Inserted Central Catheter/Midline Placement  The IV Nurse has discussed with the patient and/or persons authorized to consent for the patient, the purpose of this procedure and the potential benefits and risks involved with this procedure.  The benefits include less needle sticks, lab draws from the catheter, and the patient may be discharged home with the catheter. Risks include, but not limited to, infection, bleeding, blood clot (thrombus formation), and puncture of an artery; nerve damage and irregular heartbeat and possibility to perform a PICC exchange if needed/ordered by physician.  Alternatives to this procedure were also discussed.  Bard Power PICC patient education guide, fact sheet on infection prevention and patient information card has been provided to patient /or left at bedside.    PICC/Midline Placement Documentation        Logan Levine 05/06/2017, 12:39 PM

## 2017-05-06 NOTE — Progress Notes (Signed)
[facility] for Infectious Disease   Reason for visit: Follow up on prosthetic joint infection  Interval History: no growth on culture to date; no fever.  wBC 2.7.  Some pain in his knee, nervous about picc line.    Physical Exam: Constitutional:  Vitals:   05/06/17 0601 05/06/17 0739  BP: 117/76   Pulse:    Resp: 14   Temp: 98 F (36.7 C)   SpO2: 99% 98%   patient appears in some mild distress with knee pain Eyes: anicteric Respiratory: Normal respiratory effort; CTA B Cardiovascular: RRR GI: soft, nt, nd  Review of Systems: Constitutional: negative for fevers Gastrointestinal: negative for diarrhea  Lab Results  Component Value Date   WBC 2.7 (L) 04/30/2017   HGB 12.3 (L) 04/30/2017   HCT 37.0 (L) 04/30/2017   MCV 88.5 04/30/2017   PLT 154 04/30/2017    Lab Results  Component Value Date   CREATININE 1.30 (H) 04/30/2017   BUN 41 (H) 04/30/2017   NA 135 04/30/2017   K 5.3 (H) 05/04/2017   CL 105 04/30/2017   CO2 23 04/30/2017    Lab Results  Component Value Date   ALT 54 04/30/2017   AST 75 (H) 04/30/2017   ALKPHOS 136 (H) 04/30/2017     Microbiology: Recent Results (from the past 240 hour(s))  Surgical pcr screen     Status: None   Collection Time: 04/30/17  9:30 AM  Result Value Ref Range Status   MRSA, PCR NEGATIVE NEGATIVE Final   Staphylococcus aureus NEGATIVE NEGATIVE Final    Comment: (NOTE) The Xpert SA Assay (FDA approved for NASAL specimens in patients 47 years of age and older), is one component of a comprehensive surveillance program. It is not intended to diagnose infection nor to guide or monitor treatment. Performed at [facility], [place] [address] [place], [place] [address]   Aerobic/Anaerobic Culture (surgical/deep wound)     Status: None (Preliminary result)   Collection Time: 05/04/17  2:35 PM  Result Value Ref Range Status   Specimen Description   Final    SYNOVIAL LEFT KNEE Performed at [facility] [address] [place], [place] [address]    Special Requests   Final    NONE Performed at [facility], [place] [address], [place], [place] [address]    Gram Stain   Final    ABUNDANT WBC PRESENT,BOTH PMN AND MONONUCLEAR NO ORGANISMS SEEN Gram Stain Report Called to,Read Back By and Verified With: ROBBINS,M @ 1855 [id number] [id number] BY POTEAT,S Performed at [facility], [place] [address], [place], [place] [address]    Culture   Final    NO GROWTH < 24 HOURS Performed at [facility] [address] [place], [place] [address]    Report Status PENDING  Incomplete    Impression/Plan:  1. Prosthetic joint infection s/p resection arthroplasty with spacer placement - will treat for 6 weeks with vancomycin and ceftriaxone (added today) through Jun 15 2017; longer if there are any concerns at that time.  I will have him follow up with me in about 4-5 weeks I am ok to defer management of this to his infectious disease providers at [facility] if he follows up there instead but otherwise I will continue to manage his care for this.  He will need twice weekly bmp and vancomycin trough managed by the SNF, trough goal 15-20; weekly cbc, esr, crp and  a copy can be sent to fax [phone]  2.  HIV - has been well controlled on [place] and [place] with suppressed viral load though not checked recently.  I will recheck in the am.  He is followed at [facility] for this.  He will need follow up with them soon as he missed his recent routine follow up.    3.  Chronic hepatitis C - genotype 1a.  Per notes from [facility] he was offered treatment but refused at the time.

## 2017-05-06 NOTE — Clinical Social Work Note (Signed)
Clinical Social Work Assessment  Patient Details  Name: Logan Levine MRN: [id number] Date of Birth: 01-01-1951  Date of referral:  05/06/17               Reason for consult:  Facility Placement                Permission sought to share information with:  Family Supports Permission granted to share information::  Yes, Verbal Permission Granted  Name::     Fish farm manager::  SNF  Relationship::  Girlfriend/Caretaker-  Contact Information:  [phone]  Housing/Transportation Living arrangements for the past 2 months:  [place] of Information:  Patient, Friend/Neighbor Patient Interpreter Needed:  None Criminal Activity/Legal Involvement Pertinent to Current Situation/Hospitalization:  No - Comment as needed Significant Relationships:  Friend Lives with:  Self Do you feel safe going back to the place where you live?  Yes Need for family participation in patient care:  Yes ([place] with mobility)  Care giving concerns:   SNF placement for short rehab.   Social Worker assessment / plan:  CSW met with the patient at bedside, explained csw role and reason for visit-to assist with discharge plan to SNF for short rehab.  Patient reports he prefer to go to a SNF close to his home in [place], [place]. The patient states he lives home alone. He uses a walker while in the home and uses a wheelchair when he is outside. Patient informed CSW he has difficulty reading and understanding things therefore provided CSW with his caretaker [name] contact information  to discuss to d/c planning.   CSW called Renee with patient present.  CSW explain SNF process and later following up bed offers. She reports they have discussed the patient going to [facility].  Patient was brought to hospital via [facility] wheel chair transport and he will discharge by [facility]. [name] will pick up the patient wheelchair.   Plan: SNF  Employment status:    Insurance information:  Medicare PT  Recommendations:  [place] / Referral to community resources:  [place]  Patient/Family's Response to care:  Agreeable and Responding well to care.   Patient/Family's Understanding of and Emotional Response to Diagnosis, Current Treatment, and Prognosis: Patient reports "I had knee surgery done in 2001 and it was wore out and needed to be replaced." Patient has understanding of his diagnosis but informed CSW he has difficulty reading and understanding. Patient friend/caretaker [name] very involved in patient care.   Emotional Assessment Appearance:  Appears stated age Attitude/Demeanor/Rapport:    Affect (typically observed):  Accepting Orientation:  Oriented to Self, Oriented to Place, Oriented to  Time, Oriented to Situation Alcohol / Substance use:  Not Applicable Psych involvement (Current and /or in the community):  No (Comment)  Discharge Needs  Concerns to be addressed:  Discharge Planning Concerns Readmission within the last 30 days:  No Current discharge risk:  Dependent with Mobility Barriers to Discharge:  Continued Medical Work up   [employer], LCSW 05/06/2017, 11:58 AM

## 2017-05-06 NOTE — Clinical Social Work Placement (Addendum)
D/C Summary sent  Nurse given number to call report.  Med. Nes. Complete  PTAR called arranged 12:00pm pick up.    CLINICAL SOCIAL WORK PLACEMENT  NOTE  Date:  05/06/2017  Patient Details  Name: Logan Levine MRN: [id number] Date of Birth: 03-Apr-1950  Clinical Social Work is seeking post-discharge placement for this patient at the [place] level of care (*CSW will initial, date and re-position this form in  chart as items are completed):  Yes     Patient/family provided with [facility] Work Department's list of facilities offering this level of care within the geographic area requested by the patient (or if unable, by the patient's family).   Yes   Patient/family informed of their freedom to choose among providers that offer the needed level of care, that participate in Medicare, Medicaid or managed care program needed by the patient, have an available bed and are willing to accept the patient.  Yes   Patient/family informed of [facility]'s ownership interest in [facility] and [facility], as well as of the fact that they are under no obligation to receive care at these facilities.  PASRR submitted to EDS on       PASRR number received on       Existing PASRR number confirmed on 05/06/17([phone] [id number])     FL2 transmitted to all facilities in geographic area requested by pt/family on       FL2 transmitted to all facilities within larger geographic area on 05/06/17     Patient informed that his/her managed care company has contracts with or will negotiate with certain facilities, including the following:        Yes   Patient/family informed of bed offers received.  Patient chooses bed at    [facility]   Physician recommends and patient chooses bed at      Patient to be transferred to   on  . [place]   Patient to be transferred to facility by [facility]      Patient family notified on   of transfer. 05/07/2017  Name  of family member notified:  Renee     PHYSICIAN       Additional Comment:    _______________________________________________ Lia Hopping, [place] 05/06/2017, 12:22 PM

## 2017-05-06 NOTE — Progress Notes (Signed)
Physical Therapy Treatment Patient Details Name: Logan Levine MRN: [id number] DOB: [birth year] Today's Date: 05/06/2017    History of Present Illness Pt is a 67 year old male admitted for left knee resection arthroplasty with antibiotic spacer    PT Comments    Pt limited by pain today and only able to tolerate a few feet of ambulation.  Follow Up Recommendations  SNF     Equipment Recommendations  None recommended by PT    Recommendations for Other Services       Precautions / Restrictions Precautions Precautions: Knee;Fall Precaution Comments: No ROM  Required Braces or Orthoses: Knee Immobilizer - Left Restrictions Weight Bearing Restrictions: No Other Position/Activity Restrictions: WBAT    Mobility  Bed Mobility               General bed mobility comments: pt up in recliner   Transfers Overall transfer level: Needs assistance Equipment used: Rolling walker (2 wheeled) Transfers: Sit to/from Stand Sit to Stand: Min assist         General transfer comment: verbal cues for UE and LE positioning, increased time, assist required for controlled descent due to pt's increased pain  Ambulation/Gait Ambulation/Gait assistance: Min assist Ambulation Distance (Feet): 5 Feet Assistive device: Rolling walker (2 wheeled) Gait Pattern/deviations: Step-to pattern;Decreased stance time - left;Antalgic     General Gait Details: verbal cues for sequence, RW positioning, pt reports difficulty advancing LE and increased pain, attempted to ambulate but requested to sit down after a few feet.   Stairs            Wheelchair Mobility    Modified Rankin (Stroke Patients Only)       Balance                                            Cognition Arousal/Alertness: Awake/alert Behavior During Therapy: WFL for tasks assessed/performed Overall Cognitive Status: Within Functional Limits for tasks assessed                                         Exercises      General Comments        Pertinent Vitals/Pain Pain Assessment: 0-10 Pain Score: 7  Pain Location: L knee Pain Descriptors / Indicators: Sore;Aching Pain Intervention(s): Limited activity within patient's tolerance;Repositioned;Monitored during session;Patient requesting pain meds-RN notified;Ice applied    Home Living                      Prior Function            PT Goals (current goals can now be found in the care plan section) Progress towards PT goals: Progressing toward goals    Frequency    7X/week      PT Plan Current plan remains appropriate    Co-evaluation              AM-PAC PT "6 Clicks" Daily Activity  Outcome Measure  Difficulty turning over in bed (including adjusting bedclothes, sheets and blankets)?: A Lot Difficulty moving from lying on back to sitting on the side of the bed? : Unable Difficulty sitting down on and standing up from a chair with arms (e.g., wheelchair, bedside commode, etc,.)?: Unable Help needed moving to and from a bed  to chair (including a wheelchair)?: A Little Help needed walking in hospital room?: A Little Help needed climbing 3-5 steps with a railing? : A Lot 6 Click Score: 12    End of Session Equipment Utilized During Treatment: Gait belt;Left knee immobilizer Activity Tolerance: Patient limited by pain Patient left: with call bell/phone within reach;in chair;with chair alarm set Nurse Communication: Mobility status;Patient requests pain meds(RN states not quite due for meds yet) PT Visit Diagnosis: Other abnormalities of gait and mobility (R26.89)     Time: 4944-9675 PT Time Calculation (min) (ACUTE ONLY): 17 min  Charges:  $Gait Training: 8-22 mins                    G Codes:       Carmelia Bake, PT, DPT 05/06/2017 Pager: [phone]  [name] 05/06/2017, 12:34 PM

## 2017-05-06 NOTE — Plan of Care (Signed)
Plan of care discussed.   

## 2017-05-06 NOTE — Progress Notes (Signed)
PHARMACY CONSULT NOTE FOR:  OUTPATIENT  PARENTERAL ANTIBIOTIC THERAPY (OPAT)  Indication: prosthetic joint infection Regimen: Ceftriaxone 2gm IV q24h and Vancomycin 1250mg  IV q24h End date: 06/15/17  IV antibiotic discharge orders are pended. To discharging provider:  please sign these orders via discharge navigator,  Select New Orders & click on the button choice - Manage This Unsigned Work.     Thank you for allowing pharmacy to be a part of this patient's care.  Dolly Rias RPh 05/06/2017, 1:37 PM Pager [phone]

## 2017-05-07 DIAGNOSIS — K219 Gastro-esophageal reflux disease without esophagitis: Secondary | ICD-10-CM | POA: Diagnosis not present

## 2017-05-07 DIAGNOSIS — J410 Simple chronic bronchitis: Secondary | ICD-10-CM | POA: Diagnosis not present

## 2017-05-07 DIAGNOSIS — E669 Obesity, unspecified: Secondary | ICD-10-CM | POA: Diagnosis not present

## 2017-05-07 DIAGNOSIS — M6281 Muscle weakness (generalized): Secondary | ICD-10-CM | POA: Diagnosis not present

## 2017-05-07 DIAGNOSIS — M79609 Pain in unspecified limb: Secondary | ICD-10-CM | POA: Diagnosis not present

## 2017-05-07 DIAGNOSIS — L03116 Cellulitis of left lower limb: Secondary | ICD-10-CM | POA: Diagnosis not present

## 2017-05-07 DIAGNOSIS — M25562 Pain in left knee: Secondary | ICD-10-CM | POA: Diagnosis not present

## 2017-05-07 DIAGNOSIS — B029 Zoster without complications: Secondary | ICD-10-CM | POA: Diagnosis not present

## 2017-05-07 DIAGNOSIS — B079 Viral wart, unspecified: Secondary | ICD-10-CM | POA: Diagnosis not present

## 2017-05-07 DIAGNOSIS — B182 Chronic viral hepatitis C: Secondary | ICD-10-CM | POA: Diagnosis not present

## 2017-05-07 DIAGNOSIS — M009 Pyogenic arthritis, unspecified: Secondary | ICD-10-CM | POA: Diagnosis not present

## 2017-05-07 DIAGNOSIS — R188 Other ascites: Secondary | ICD-10-CM | POA: Diagnosis not present

## 2017-05-07 DIAGNOSIS — B20 Human immunodeficiency virus [HIV] disease: Secondary | ICD-10-CM | POA: Diagnosis not present

## 2017-05-07 DIAGNOSIS — R945 Abnormal results of liver function studies: Secondary | ICD-10-CM | POA: Diagnosis not present

## 2017-05-07 DIAGNOSIS — Z96652 Presence of left artificial knee joint: Secondary | ICD-10-CM | POA: Diagnosis not present

## 2017-05-07 DIAGNOSIS — I10 Essential (primary) hypertension: Secondary | ICD-10-CM | POA: Diagnosis not present

## 2017-05-07 DIAGNOSIS — M109 Gout, unspecified: Secondary | ICD-10-CM | POA: Diagnosis not present

## 2017-05-07 DIAGNOSIS — R262 Difficulty in walking, not elsewhere classified: Secondary | ICD-10-CM | POA: Diagnosis not present

## 2017-05-07 DIAGNOSIS — E785 Hyperlipidemia, unspecified: Secondary | ICD-10-CM | POA: Diagnosis not present

## 2017-05-07 DIAGNOSIS — D649 Anemia, unspecified: Secondary | ICD-10-CM | POA: Diagnosis not present

## 2017-05-07 DIAGNOSIS — J449 Chronic obstructive pulmonary disease, unspecified: Secondary | ICD-10-CM | POA: Diagnosis not present

## 2017-05-07 DIAGNOSIS — N10 Acute tubulo-interstitial nephritis: Secondary | ICD-10-CM | POA: Diagnosis not present

## 2017-05-07 DIAGNOSIS — Z471 Aftercare following joint replacement surgery: Secondary | ICD-10-CM | POA: Diagnosis not present

## 2017-05-07 DIAGNOSIS — N401 Enlarged prostate with lower urinary tract symptoms: Secondary | ICD-10-CM | POA: Diagnosis not present

## 2017-05-07 DIAGNOSIS — K5900 Constipation, unspecified: Secondary | ICD-10-CM | POA: Diagnosis not present

## 2017-05-07 DIAGNOSIS — F0390 Unspecified dementia without behavioral disturbance: Secondary | ICD-10-CM | POA: Diagnosis not present

## 2017-05-07 DIAGNOSIS — K429 Umbilical hernia without obstruction or gangrene: Secondary | ICD-10-CM | POA: Diagnosis not present

## 2017-05-07 DIAGNOSIS — M1990 Unspecified osteoarthritis, unspecified site: Secondary | ICD-10-CM | POA: Diagnosis not present

## 2017-05-07 DIAGNOSIS — A6000 Herpesviral infection of urogenital system, unspecified: Secondary | ICD-10-CM | POA: Diagnosis not present

## 2017-05-07 DIAGNOSIS — Z21 Asymptomatic human immunodeficiency virus [HIV] infection status: Secondary | ICD-10-CM | POA: Diagnosis not present

## 2017-05-07 DIAGNOSIS — R5381 Other malaise: Secondary | ICD-10-CM | POA: Diagnosis not present

## 2017-05-07 DIAGNOSIS — A539 Syphilis, unspecified: Secondary | ICD-10-CM | POA: Diagnosis not present

## 2017-05-07 DIAGNOSIS — R6520 Severe sepsis without septic shock: Secondary | ICD-10-CM | POA: Diagnosis not present

## 2017-05-07 LAB — T-HELPER CELLS (CD4) COUNT (NOT AT ARMC)
CD4 % Helper T Cell: 19 % — ABNORMAL LOW (ref 33–55)
CD4 T CELL ABS: 140 /uL — AB (ref 400–2700)

## 2017-05-07 LAB — CREATININE, SERUM
Creatinine, Ser: 1.14 mg/dL (ref 0.61–1.24)
GFR calc Af Amer: >60 mL/min (ref >60)
GFR calc non Af Amer: >60 mL/min (ref >60)

## 2017-05-07 MED ORDER — HEPARIN SOD (PORK) LOCK FLUSH 100 UNIT/ML IV SOLN
250.0000 [IU] | INTRAVENOUS | Status: AC | PRN
  Administered 2017-05-07: 12:00:00 250 [IU]

## 2017-05-07 MED ORDER — METHOCARBAMOL 500 MG PO TABS: 500.0000 mg | ORAL_TABLET | Freq: Four times a day (QID) | ORAL | 0 refills | Status: DC | PRN

## 2017-05-07 MED ORDER — OXYCODONE HCL 5 MG PO TABS: 5.0000 mg | ORAL_TABLET | ORAL | 0 refills | Status: DC | PRN

## 2017-05-07 MED ORDER — VANCOMYCIN IV (FOR PTA / DISCHARGE USE ONLY): 1250.0000 mg | INTRAVENOUS | 0 refills | Status: AC

## 2017-05-07 MED ORDER — ACETAMINOPHEN 325 MG PO TABS: 325.0000 mg | ORAL_TABLET | Freq: Four times a day (QID) | ORAL | 0 refills | Status: DC | PRN

## 2017-05-07 MED ORDER — ASPIRIN 325 MG PO TBEC: 325.0000 mg | DELAYED_RELEASE_TABLET | Freq: Every day | ORAL | 0 refills | Status: DC

## 2017-05-07 MED ORDER — CEFTRIAXONE IV (FOR PTA / DISCHARGE USE ONLY): 2.0000 g | INTRAVENOUS | 0 refills | Status: AC

## 2017-05-07 NOTE — Progress Notes (Signed)
Physical Therapy Treatment Patient Details Name: Logan Levine MRN: [id number] DOB: August 22, 1950 Today's Date: 05/07/2017    History of Present Illness Pt is a 67 year old male admitted for left knee resection arthroplasty with antibiotic spacer    PT Comments    Pt reports feeling better today and tolerated ambulating 40 feet today, reclining following for safety.  Pt to d/c to SNF today.  Follow Up Recommendations  SNF     Equipment Recommendations  None recommended by PT    Recommendations for Other Services       Precautions / Restrictions Precautions Precautions: Knee;Fall Precaution Comments: No ROM  Required Braces or Orthoses: Knee Immobilizer - Left Restrictions Weight Bearing Restrictions: No Other Position/Activity Restrictions: WBAT    Mobility  Bed Mobility Overal bed mobility: Needs Assistance Bed Mobility: Supine to Sit     Supine to sit: Min assist     General bed mobility comments: assist for L LE  Transfers Overall transfer level: Needs assistance Equipment used: Rolling walker (2 wheeled) Transfers: Sit to/from Stand Sit to Stand: Min assist         General transfer comment: verbal cues for UE and LE positioning, increased time  Ambulation/Gait Ambulation/Gait assistance: Min assist Ambulation Distance (Feet): 40 Feet Assistive device: Rolling walker (2 wheeled) Gait Pattern/deviations: Step-to pattern;Decreased stance time - left;Antalgic     General Gait Details: verbal cues for sequence, RW positioning, posture, recliner following for safety   Stairs            Wheelchair Mobility    Modified Rankin (Stroke Patients Only)       Balance                                            Cognition Arousal/Alertness: Awake/alert Behavior During Therapy: WFL for tasks assessed/performed Overall Cognitive Status: Within Functional Limits for tasks assessed                                         Exercises      General Comments        Pertinent Vitals/Pain Pain Assessment: 0-10 Pain Score: 6  Pain Location: L knee Pain Descriptors / Indicators: Sore;Aching Pain Intervention(s): Limited activity within patient's tolerance;Repositioned;Monitored during session;RN gave pain meds during session    Home Living                      Prior Function            PT Goals (current goals can now be found in the care plan section) Progress towards PT goals: Progressing toward goals    Frequency    7X/week      PT Plan Current plan remains appropriate    Co-evaluation              AM-PAC PT "6 Clicks" Daily Activity  Outcome Measure  Difficulty turning over in bed (including adjusting bedclothes, sheets and blankets)?: A Lot Difficulty moving from lying on back to sitting on the side of the bed? : Unable Difficulty sitting down on and standing up from a chair with arms (e.g., wheelchair, bedside commode, etc,.)?: Unable Help needed moving to and from a bed to chair (including a wheelchair)?: A Little Help needed walking  in hospital room?: A Little Help needed climbing 3-5 steps with a railing? : A Lot 6 Click Score: 12    End of Session Equipment Utilized During Treatment: Gait belt;Left knee immobilizer Activity Tolerance: Patient tolerated treatment well Patient left: with call bell/phone within reach;in chair;with chair alarm set Nurse Communication: Patient requests pain meds PT Visit Diagnosis: Other abnormalities of gait and mobility (R26.89)     Time: 0258-5277 PT Time Calculation (min) (ACUTE ONLY): 24 min  Charges:  $Gait Training: 8-22 mins                    G Codes:      Carmelia Bake, PT, DPT 05/07/2017 Pager: [phone]    [name] 05/07/2017, 11:11 AM
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
## 2017-05-07 NOTE — Progress Notes (Signed)
Patient transferred to Skilled nursing facility. Report called to [facility]. All belongings w/ patient.

## 2017-05-07 NOTE — NC FL2 (Signed)
[place] LEVEL OF CARE SCREENING TOOL     IDENTIFICATION  Patient Name: Logan Levine Birthdate: 08-26-1950 Sex: male Admission Date (Current Location): 05/04/2017  [facility] and [place] Number:  Herbalist and Address:  [facility],  [place] [address], [place]      Provider Number: [id number]  Attending Physician Name and Address:  Gaynelle Arabian, MD  Relative Name and Phone Number:       Current Level of Care: Hospital Recommended Level of Care: [place] Prior Approval Number:    Date Approved/Denied:   PASRR Number: [id number]  Discharge Plan: SNF    Current Diagnoses: Patient Active Problem List   Diagnosis Date Noted  . Septic arthritis of knee, left ([place]) 05/04/2017  . Hepatic cirrhosis due to chronic hepatitis C infection ([place]) 10/29/2016  . Constipation 10/29/2016  . TOTAL KNEE REPLACEMENT, LEFT, HX OF 11/12/2009  . KNEE PAIN, ACUTE 11/01/2008  . DEMENTIA, MILD 11/15/2007  . PYELONEPHRITIS, ACUTE 05/28/2007  . HERPES, GENITAL NEC 11/19/2006  . GOUT NOS 11/19/2006  . SMOKER 11/19/2006  . HYPERTENSION, BENIGN ESSENTIAL 11/19/2006  . BRONCHITIS NOS 11/19/2006  . BRONCHITIS, CHRONIC NEC 11/19/2006  . HERNIA, UMBILICAL 95/28/4132  . HIV DISEASE 07/03/2006  . HEPATITIS C 07/03/2006  . SYPHILIS, EARLY, SYMPTOMATIC, PRIMARY NEC 07/03/2006  . OSTEOARTHRITIS 07/03/2006    Orientation RESPIRATION BLADDER Height & Weight     Self, Time, Place  Normal Continent Weight: 168 lb (76.2 kg) Height:  5\' 9"  (175.3 cm)  BEHAVIORAL SYMPTOMS/MOOD NEUROLOGICAL BOWEL NUTRITION STATUS      Continent Diet(Low Sodium Heart Healthy )  AMBULATORY STATUS COMMUNICATION OF NEEDS Skin   Extensive Assist Verbally (lEFT knee Incision )                       Personal Care Assistance Level of Assistance  Bathing, Feeding, Dressing Bathing Assistance: Limited assistance Feeding assistance: Independent Dressing  Assistance: Limited assistance     Functional Limitations Info  Sight, Hearing, Speech Sight Info: Adequate Hearing Info: Adequate Speech Info: Adequate    SPECIAL CARE FACTORS FREQUENCY  PT (By licensed PT), OT (By licensed OT)     PT Frequency: 7x/week OT Frequency: 7x/week            Contractures Contractures Info: Not present    Additional Factors Info  Code Status, Allergies Code Status Info: Fullcode Allergies Info: Allergies: Other          Current Medications (05/07/2017):  This is the current hospital active medication list Current Facility-Administered Medications  Medication Dose Route Frequency Provider Last Rate Last Dose  . 0.9 %  sodium chloride infusion   Intravenous Continuous Gaynelle Arabian, MD   Stopped at 05/05/17 1100  . acetaminophen (TYLENOL) tablet 325-650 mg  325-650 mg Oral Q6H PRN Aluisio, Pilar Plate, MD      . albuterol (PROVENTIL) (2.5 MG/3ML) 0.083% nebulizer solution 2.5 mg  2.5 mg Nebulization BID Gaynelle Arabian, MD   2.5 mg at 05/07/17 0846  . aspirin EC tablet 325 mg  325 mg Oral Daily Gaynelle Arabian, MD   325 mg at 05/07/17 1017  . budesonide (PULMICORT) nebulizer solution 0.25 mg  0.25 mg Nebulization BID Gaynelle Arabian, MD   0.25 mg at 05/07/17 0852  . cefTRIAXone (ROCEPHIN) 2 g in sodium chloride 0.9 % 100 mL IVPB  2 g Intravenous Q24H Thayer Headings, MD   Stopped at 05/06/17 1554  . docusate  sodium (COLACE) capsule 100 mg  100 mg Oral BID Gaynelle Arabian, MD   100 mg at 05/07/17 1018  . emtricitabine-tenofovir AF (DESCOVY) 200-25 MG per tablet 1 tablet  1 tablet Oral Daily Gaynelle Arabian, MD   1 tablet at 05/07/17 1018  . famotidine (PEPCID) tablet 20 mg  20 mg Oral QHS Gaynelle Arabian, MD   20 mg at 05/06/17 2230  . hydrochlorothiazide (HYDRODIURIL) tablet 25 mg  25 mg Oral Daily Gaynelle Arabian, MD   25 mg at 05/07/17 1019  . HYDROmorphone (DILAUDID) injection 0.5-1 mg  0.5-1 mg Intravenous Q4H PRN Aluisio, Pilar Plate, MD      . methocarbamol  (ROBAXIN) tablet 500 mg  500 mg Oral Q6H PRN Gaynelle Arabian, MD   500 mg at 05/06/17 1609   Or  . methocarbamol (ROBAXIN) 500 mg in dextrose 5 % 50 mL IVPB  500 mg Intravenous Q6H PRN Gaynelle Arabian, MD 110 mL/hr at 05/04/17 1611 500 mg at 05/04/17 1611  . metoCLOPramide (REGLAN) tablet 5-10 mg  5-10 mg Oral Q8H PRN Aluisio, Pilar Plate, MD       Or  . metoCLOPramide (REGLAN) injection 5-10 mg  5-10 mg Intravenous Q8H PRN Aluisio, Pilar Plate, MD      . ondansetron [facility]) tablet 4 mg  4 mg Oral Q6H PRN Gaynelle Arabian, MD   4 mg at 05/07/17 1111   Or  . ondansetron (ZOFRAN) injection 4 mg  4 mg Intravenous Q6H PRN Aluisio, Pilar Plate, MD      . oxyCODONE (Oxy IR/ROXICODONE) immediate release tablet 10-15 mg  10-15 mg Oral Q4H PRN Gaynelle Arabian, MD   15 mg at 05/07/17 0952  . oxyCODONE (Oxy IR/ROXICODONE) immediate release tablet 5-10 mg  5-10 mg Oral Q4H PRN Gaynelle Arabian, MD   10 mg at 05/06/17 0619  . raltegravir (ISENTRESS) tablet 400 mg  400 mg Oral BID Gaynelle Arabian, MD   400 mg at 05/07/17 1019  . sodium chloride flush (NS) 0.9 % injection 10-40 mL  10-40 mL Intracatheter Q12H Aluisio, Frank, MD      . sodium chloride flush (NS) 0.9 % injection 10-40 mL  10-40 mL Intracatheter PRN Aluisio, Pilar Plate, MD      . sulfamethoxazole-trimethoprim (BACTRIM,SEPTRA) 400-80 MG per tablet 1 tablet  1 tablet Oral Daily Gaynelle Arabian, MD   1 tablet at 05/07/17 1019  . tamsulosin (FLOMAX) capsule 0.8 mg  0.8 mg Oral Daily Aluisio, Pilar Plate, MD   0.8 mg at 05/07/17 1019  . valACYclovir (VALTREX) tablet 500 mg  500 mg Oral Daily Gaynelle Arabian, MD   500 mg at 05/07/17 1020  . vancomycin (VANCOCIN) 1,250 mg in sodium chloride 0.9 % 250 mL IVPB  1,250 mg Intravenous Q24H Nilda Simmer, RPH   Stopped at 05/06/17 1850     Discharge Medications: Please see discharge summary for a list of discharge medications.  Relevant Imaging Results:  Relevant Lab Results:   Additional Information GUR:[id number]  Lia Hopping, LCSW

## 2017-05-07 NOTE — Care Management Note (Signed)
Case Management Note  Patient Details  Name: Logan Levine MRN: [id number] Date of Birth: [birth year]  Subjective/Objective: PT-recc SNF-CSW already following for SNF.                   Action/Plan:d/c SNF.   Expected Discharge Date:  05/07/17               Expected Discharge Plan:  Skilled Nursing Facility  In-House Referral:  Clinical Social Work  Discharge planning Services  CM Consult  Post Acute Care Choice:    Choice offered to:     DME Arranged:    DME Agency:     HH Arranged:    [place] Agency:     Status of Service:  Completed, signed off  If discussed at [employer], dates discussed:    Additional Comments:  Dessa Phi, RN 05/07/2017, 12:09 PM
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
## 2017-05-07 NOTE — Discharge Instructions (Signed)
° °Dr. Frank Aluisio °Total Joint Specialist °[facility] °[address] °[place], [place] [address] °[phone] ° °TOTAL KNEE REPLACEMENT POSTOPERATIVE DIRECTIONS ° °Knee Rehabilitation, Guidelines Following Surgery  °Results after knee surgery are often greatly improved when you follow the exercise, range of motion and muscle strengthening exercises prescribed by your doctor. Safety measures are also important to protect the knee from further injury. Any time any of these exercises cause you to have increased pain or swelling in your knee joint, decrease the amount until you are comfortable again and slowly increase them. If you have problems or questions, call your caregiver or physical therapist for advice.  ° °HOME CARE INSTRUCTIONS  °Remove items at home which could result in a fall. This includes throw rugs or furniture in walking pathways.  °· ICE to the affected knee every three hours for 30 minutes at a time and then as needed for pain and swelling.  Continue to use ice on the knee for pain and swelling from surgery. You may notice swelling that will progress down to the foot and ankle.  This is normal after surgery.  Elevate the leg when you are not up walking on it.   °· Continue to use the breathing machine which will help keep your temperature down.  It is common for your temperature to cycle up and down following surgery, especially at night when you are not up moving around and exerting yourself.  The breathing machine keeps your lungs expanded and your temperature down. °· Do not place pillow under knee, focus on keeping the knee straight while resting ° °DIET °You may resume your previous home diet once your are discharged from the hospital. ° °DRESSING / WOUND CARE / SHOWERING °You may shower 3 days after surgery, but keep the wounds dry during showering.  You may use an occlusive plastic wrap (Press'n Seal for example), NO SOAKING/SUBMERGING IN THE BATHTUB.  If the bandage gets  wet, change with a clean dry gauze.  If the incision gets wet, pat the wound dry with a clean towel. °You may start showering once you are discharged home but do not submerge the incision under water. Just pat the incision dry and apply a dry gauze dressing on daily. °Change the surgical dressing daily and reapply a dry dressing each time. ° °ACTIVITY °Walk with your walker as instructed. °Use walker as long as suggested by your caregivers. °Avoid periods of inactivity such as sitting longer than an hour when not asleep. This helps prevent blood clots.  °You may resume a sexual relationship in one month or when given the OK by your doctor.  °You may return to work once you are cleared by your doctor.  °Do not drive a car for 6 weeks or until released by you surgeon.  °Do not drive while taking narcotics. ° °WEIGHT BEARING °Weight bearing as tolerated with assist device (walker, cane, etc) as directed, use it as long as suggested by your surgeon or therapist, typically at least 4-6 weeks. ° °POSTOPERATIVE CONSTIPATION PROTOCOL °Constipation - defined medically as fewer than three stools per week and severe constipation as less than one stool per week. ° °One of the most common issues patients have following surgery is constipation.  Even if you have a regular bowel pattern at home, your normal regimen is likely to be disrupted due to multiple reasons following surgery.  Combination of anesthesia, postoperative narcotics, change in appetite and fluid intake all can affect your bowels.    In order to avoid complications following surgery, here are some recommendations in order to help you during your recovery period. ° °Colace (docusate) - Pick up an over-the-counter form of Colace or another stool softener and take twice a day as long as you are requiring postoperative pain medications.  Take with a full glass of water daily.  If you experience loose stools or diarrhea, hold the colace until you stool forms back up.  If  your symptoms do not get better within 1 week or if they get worse, check with your doctor. ° °Dulcolax (bisacodyl) - Pick up over-the-counter and take as directed by the product packaging as needed to assist with the movement of your bowels.  Take with a full glass of water.  Use this product as needed if not relieved by Colace only.  ° °MiraLax (polyethylene glycol) - Pick up over-the-counter to have on hand.  MiraLax is a solution that will increase the amount of water in your bowels to assist with bowel movements.  Take as directed and can mix with a glass of water, juice, soda, coffee, or tea.  Take if you go more than two days without a movement. °Do not use MiraLax more than once per day. Call your doctor if you are still constipated or irregular after using this medication for 7 days in a row. ° °If you continue to have problems with postoperative constipation, please contact the office for further assistance and recommendations.  If you experience "the worst abdominal pain ever" or develop nausea or vomiting, please contact the office immediatly for further recommendations for treatment. ° °ITCHING ° If you experience itching with your medications, try taking only a single pain pill, or even half a pain pill at a time.  You can also use Benadryl over the counter for itching or also to help with sleep.  ° °TED HOSE STOCKINGS °Wear the elastic stockings on both legs for three weeks following surgery during the day but you may remove then at night for sleeping. ° °MEDICATIONS °See your medication summary on the “After Visit Summary” that the nursing staff will review with you prior to discharge.  You may have some home medications which will be placed on hold until you complete the course of blood thinner medication.  It is important for you to complete the blood thinner medication as prescribed by your surgeon.  Continue your approved medications as instructed at time of discharge. ° °PRECAUTIONS °If you  experience chest pain or shortness of breath - call 911 immediately for transfer to the hospital emergency department.  °If you develop a fever greater that 101 F, purulent drainage from wound, increased redness or drainage from wound, foul odor from the wound/dressing, or calf pain - CONTACT YOUR SURGEON.   °                                                °FOLLOW-UP APPOINTMENTS °Make sure you keep all of your appointments after your operation with your surgeon and caregivers. You should call the office at the above phone number and make an appointment for approximately two weeks after the date of your surgery or on the date instructed by your surgeon outlined in the "After Visit Summary". ° ° °RANGE OF MOTION AND STRENGTHENING EXERCISES  °Rehabilitation of the knee is important following a knee injury or   an operation. After just a few days of immobilization, the muscles of the thigh which control the knee become weakened and shrink (atrophy). Knee exercises are designed to build up the tone and strength of the thigh muscles and to improve knee motion. Often times heat used for twenty to thirty minutes before working out will loosen up your tissues and help with improving the range of motion but do not use heat for the first two weeks following surgery. These exercises can be done on a training (exercise) mat, on the floor, on a table or on a bed. Use what ever works the best and is most comfortable for you Knee exercises include:  Leg Lifts - While your knee is still immobilized in a splint or cast, you can do straight leg raises. Lift the leg to 60 degrees, hold for 3 sec, and slowly lower the leg. Repeat 10-20 times 2-3 times daily. Perform this exercise against resistance later as your knee gets better.  Quad and Hamstring Sets - Tighten up the muscle on the front of the thigh (Quad) and hold for 5-10 sec. Repeat this 10-20 times hourly. Hamstring sets are done by pushing the foot backward against an object  and holding for 5-10 sec. Repeat as with quad sets.   Angel Wings: Lying on your back spread your legs to the side as far apart as you can without causing discomfort.  A rehabilitation program following serious knee injuries can speed recovery and prevent re-injury in the future due to weakened muscles. Contact your doctor or a physical therapist for more information on knee rehabilitation.   IF YOU ARE TRANSFERRED TO A SKILLED REHAB FACILITY If the patient is transferred to a skilled rehab facility following release from the hospital, a list of the current medications will be sent to the facility for the patient to continue.  When discharged from the skilled rehab facility, please have the facility set up the patient's [place] prior to being released. Also, the skilled facility will be responsible for providing the patient with their medications at time of release from the facility to include their pain medication, the muscle relaxants, and their blood thinner medication. If the patient is still at the rehab facility at time of the two week follow up appointment, the skilled rehab facility will also need to assist the patient in arranging follow up appointment in our office and any transportation needs.  MAKE SURE YOU:  Understand these instructions.  Get help right away if you are not doing well or get worse.    Pick up stool softner and laxative for home use following surgery while on pain medications. Do not submerge incision under water. Please use good hand washing techniques while changing dressing each day. May shower starting three days after surgery. Please use a clean towel to pat the incision dry following showers. Continue to use ice for pain and swelling after surgery. Do not use any lotions or creams on the incision until instructed by your surgeon.  Aspirin 325 mg daily for three weeks, then reduce back to the home 81 mg Aspirin daily.

## 2017-05-08 LAB — HIV-1 RNA QUANT-NO REFLEX-BLD: LOG10 HIV-1 RNA: UNDETERMINED {Log_copies}/mL

## 2017-05-11 LAB — AEROBIC/ANAEROBIC CULTURE W GRAM STAIN (SURGICAL/DEEP WOUND)

## 2017-05-11 LAB — AEROBIC/ANAEROBIC CULTURE (SURGICAL/DEEP WOUND)

## 2017-05-13 ENCOUNTER — Other Ambulatory Visit: Payer: Self-pay | Admitting: *Deleted

## 2017-05-13 NOTE — Patient Outreach (Signed)
[facility]) Care Management  05/13/2017  [name] 1951/01/10 [id number]   Met with dcp at facility, she reports patient has several chronic health issues and would benefit from [facility] care management services, he is at facility for short term rehab after a knee surgery.   Met with patient, he states he will only be at facility until he can maneuver steps, he states he is to have another 1-2 surgeries on his knee he thinks. He is also getting IV antibiotics and has PICC line  Patient reports he lives on very little income a month, he is behind in his utility bills and lives "by the good Lord's help". He lives with a friend. He gets transportation from a friend and sometimes uses [employer] but it is a burden. He does have Medicaid but is not using any personal care services at this time. He worries he may need a ramp, but lives in a rental so he hopes he can do the 3-5 steps he has upon discharge from the facility.  Patient reports he cannot read or write.   RNCM reviewed [facility] program.  RNCM gave packet and verified patient could read [facility] phone number and name written on packet. He voiced that he could understand name and how to call.  Patient gave verbal consent for RNCM to make referral to [facility] care management for a [facility] LCSW to follow up with patient on needs he verbalized to assess for any resources he may qualify for in the community  Plan to refer to [facility] LCSW to follow up with patient at SNF. This RNCM will continue to collaborate with [facility] staff while patient is in SNF. Royetta Crochet. Laymond Purser, RN, BSN, [place] [phone]) Business Cell  [phone]) Toll Free Office

## 2017-05-14 DIAGNOSIS — B20 Human immunodeficiency virus [HIV] disease: Secondary | ICD-10-CM | POA: Diagnosis not present

## 2017-05-14 DIAGNOSIS — D649 Anemia, unspecified: Secondary | ICD-10-CM | POA: Diagnosis not present

## 2017-05-14 DIAGNOSIS — E669 Obesity, unspecified: Secondary | ICD-10-CM | POA: Diagnosis not present

## 2017-05-14 DIAGNOSIS — J449 Chronic obstructive pulmonary disease, unspecified: Secondary | ICD-10-CM | POA: Diagnosis not present

## 2017-05-14 DIAGNOSIS — M009 Pyogenic arthritis, unspecified: Secondary | ICD-10-CM | POA: Diagnosis not present

## 2017-05-22 DIAGNOSIS — M25562 Pain in left knee: Secondary | ICD-10-CM | POA: Diagnosis not present

## 2017-05-22 DIAGNOSIS — R5381 Other malaise: Secondary | ICD-10-CM | POA: Diagnosis not present

## 2017-05-22 DIAGNOSIS — M6281 Muscle weakness (generalized): Secondary | ICD-10-CM | POA: Diagnosis not present

## 2017-05-22 DIAGNOSIS — R262 Difficulty in walking, not elsewhere classified: Secondary | ICD-10-CM | POA: Diagnosis not present

## 2017-05-28 ENCOUNTER — Other Ambulatory Visit: Payer: Self-pay | Admitting: *Deleted

## 2017-05-28 NOTE — Patient Outreach (Signed)
[facility]) Care Management  05/28/2017  Logan Levine [birth year] [id number]   CSW received referral from [place] that patient was admitted to [facility] SNF on 05/07/17 after stay at [facility] for prosthetic joint infection s/p resection arthroplasty with spacer placement and is being treated for 6 weeks with vancomycin and ceftriaxone through Jun 15 2017; longer if there are any concerns at that time. Patient has follow up with Infectious Disease MD, Dr. Linus Salmons next Wednesday, 06/03/17.   CSW met with patient at [facility] SNF (Rm#: [phone]) who reports that he basically lives alone, has a friend [name] who is limited physically with what she could assist with. Patient reports that he is illiterate and does not feel comfortable going home before his IV antibiotic treatments are completed. Patient reports that he does have Medicaid but has limited transportation, he's behind on bills - needs food, possibly a ramp - 3 steps & 5 step but he lives in a rental house. CSW will plan to meet back with patient & provide information on transportation and resources available once patient returns home.    Raynaldo Opitz, LCSW [facility]  Clinical Social Worker cell #: [phone]

## 2017-06-03 ENCOUNTER — Inpatient Hospital Stay: Payer: Medicare Other | Admitting: Internal Medicine

## 2017-06-04 DIAGNOSIS — Z21 Asymptomatic human immunodeficiency virus [HIV] infection status: Secondary | ICD-10-CM | POA: Diagnosis not present

## 2017-06-04 DIAGNOSIS — R945 Abnormal results of liver function studies: Secondary | ICD-10-CM | POA: Diagnosis not present

## 2017-06-04 DIAGNOSIS — B182 Chronic viral hepatitis C: Secondary | ICD-10-CM | POA: Diagnosis not present

## 2017-06-04 DIAGNOSIS — M6281 Muscle weakness (generalized): Secondary | ICD-10-CM | POA: Diagnosis not present

## 2017-06-05 ENCOUNTER — Ambulatory Visit: Payer: Medicare Other | Admitting: *Deleted

## 2017-06-09 ENCOUNTER — Ambulatory Visit: Payer: Medicare Other | Admitting: *Deleted

## 2017-06-10 ENCOUNTER — Other Ambulatory Visit: Payer: Self-pay | Admitting: *Deleted

## 2017-06-10 NOTE — Patient Outreach (Signed)
[name]  06/10/2017  Logan Levine September 30, 1950 [id number]   CSW met with patient at SNF to discuss discharge plans. Patient reports that he plans to return home with his significant other of 15-20 years, Logan Levine and hopes that he will be able to return home after his IV antibiotics regimen finishes on 5/14. Patient was in his wheelchair but was able to get up with his rolling walker and shared that though he does have 3 steps in his front door and 5 steps out his back door, he feels confident that he would be able to handle that. Patient reports that as far as transportation for medical appointments, he is taken care of - either Renee or friends are able to transport him. Patient reports that he cooks and she cleans & does laundry. CSW will check back with patient next week to confirm discharge date and make referral to [place] for transition of care. Patient signed [facility] consent, which will be scanned into epic.    Raynaldo Opitz, LCSW [facility]  Clinical Social Worker cell #: [phone]

## 2017-06-11 DIAGNOSIS — B182 Chronic viral hepatitis C: Secondary | ICD-10-CM | POA: Diagnosis not present

## 2017-06-11 DIAGNOSIS — A6000 Herpesviral infection of urogenital system, unspecified: Secondary | ICD-10-CM | POA: Diagnosis not present

## 2017-06-11 DIAGNOSIS — R945 Abnormal results of liver function studies: Secondary | ICD-10-CM | POA: Diagnosis not present

## 2017-06-16 ENCOUNTER — Other Ambulatory Visit: Payer: Self-pay | Admitting: *Deleted

## 2017-06-16 DIAGNOSIS — Z471 Aftercare following joint replacement surgery: Secondary | ICD-10-CM | POA: Diagnosis not present

## 2017-06-16 DIAGNOSIS — L03116 Cellulitis of left lower limb: Secondary | ICD-10-CM | POA: Diagnosis not present

## 2017-06-16 DIAGNOSIS — Z96652 Presence of left artificial knee joint: Secondary | ICD-10-CM | POA: Diagnosis not present

## 2017-06-16 DIAGNOSIS — Z21 Asymptomatic human immunodeficiency virus [HIV] infection status: Secondary | ICD-10-CM | POA: Diagnosis not present

## 2017-06-16 DIAGNOSIS — B182 Chronic viral hepatitis C: Secondary | ICD-10-CM | POA: Diagnosis not present

## 2017-06-16 DIAGNOSIS — R262 Difficulty in walking, not elsewhere classified: Secondary | ICD-10-CM | POA: Diagnosis not present

## 2017-06-16 NOTE — Patient Outreach (Signed)
[facility]) Care Management  06/16/2017  BURLE KWAN 1950-09-03 [id number]   CSW called & spoke with [place] at [facility] to confirm that patient is scheduled to discharge home this afternoon from SNF as his antibiotic course is done. Patient was looking forward to returning home and feels confident that he will do well at home. CSW will make referral to [place] for follow-up with Transition of Care. CSW will sign off at this time as no further CSW needs are identified at this time.    Raynaldo Opitz, LCSW [facility]  Clinical Social Worker cell #: [phone]

## 2017-06-17 ENCOUNTER — Other Ambulatory Visit: Payer: Self-pay | Admitting: *Deleted

## 2017-06-17 NOTE — Patient Outreach (Signed)
Referral received from [facility] for transition of care, pt discharged [facility] skilled nursing facility on 06/16/17 after hospitalization for prosthetic joint infection with spacer placement and IV antibiotics, pt has history septic arthritis left knee, TKR, Hep C with cirrhosis, mild dementia, genital herpes, gout, smoker, HTN, HIV, syphillis.  Spoke with pt,  HIPAA verified, permission given to speak to girlfriend Joylene Draft per pt request, pt states he has all medications and can afford, RN CM attempted to reconcile medications and girlfriend states " I told you we've got everything except inhalers being delivered"  Pt and girlfriend seem ready/ in a hurry to end phone conversation.  Both state pt does not need services and decline home visit,  RN CM mailed successful outreach letter to patient's home and faxed case closure letter to primary MD Dr. Sherrie Sport.  PLAN Close case  Jacqlyn Larsen [facility] [place] Coordinator [phone]

## 2017-06-22 DIAGNOSIS — Z21 Asymptomatic human immunodeficiency virus [HIV] infection status: Secondary | ICD-10-CM | POA: Diagnosis not present

## 2017-06-22 DIAGNOSIS — M6281 Muscle weakness (generalized): Secondary | ICD-10-CM | POA: Diagnosis not present

## 2017-06-22 DIAGNOSIS — R262 Difficulty in walking, not elsewhere classified: Secondary | ICD-10-CM | POA: Diagnosis not present

## 2017-06-22 DIAGNOSIS — M1712 Unilateral primary osteoarthritis, left knee: Secondary | ICD-10-CM | POA: Diagnosis not present

## 2017-06-22 DIAGNOSIS — Z9889 Other specified postprocedural states: Secondary | ICD-10-CM | POA: Diagnosis not present

## 2017-06-22 DIAGNOSIS — Z96652 Presence of left artificial knee joint: Secondary | ICD-10-CM | POA: Diagnosis not present

## 2017-06-22 DIAGNOSIS — M25562 Pain in left knee: Secondary | ICD-10-CM | POA: Diagnosis not present

## 2017-06-26 DIAGNOSIS — M25562 Pain in left knee: Secondary | ICD-10-CM | POA: Diagnosis not present

## 2017-06-26 DIAGNOSIS — M6281 Muscle weakness (generalized): Secondary | ICD-10-CM | POA: Diagnosis not present

## 2017-06-26 DIAGNOSIS — Z21 Asymptomatic human immunodeficiency virus [HIV] infection status: Secondary | ICD-10-CM | POA: Diagnosis not present

## 2017-06-26 DIAGNOSIS — Z96652 Presence of left artificial knee joint: Secondary | ICD-10-CM | POA: Diagnosis not present

## 2017-06-26 DIAGNOSIS — Z9889 Other specified postprocedural states: Secondary | ICD-10-CM | POA: Diagnosis not present

## 2017-06-26 DIAGNOSIS — R262 Difficulty in walking, not elsewhere classified: Secondary | ICD-10-CM | POA: Diagnosis not present

## 2017-06-30 DIAGNOSIS — M6281 Muscle weakness (generalized): Secondary | ICD-10-CM | POA: Diagnosis not present

## 2017-06-30 DIAGNOSIS — M25562 Pain in left knee: Secondary | ICD-10-CM | POA: Diagnosis not present

## 2017-06-30 DIAGNOSIS — Z21 Asymptomatic human immunodeficiency virus [HIV] infection status: Secondary | ICD-10-CM | POA: Diagnosis not present

## 2017-06-30 DIAGNOSIS — R262 Difficulty in walking, not elsewhere classified: Secondary | ICD-10-CM | POA: Diagnosis not present

## 2017-06-30 DIAGNOSIS — Z9889 Other specified postprocedural states: Secondary | ICD-10-CM | POA: Diagnosis not present

## 2017-06-30 DIAGNOSIS — Z96652 Presence of left artificial knee joint: Secondary | ICD-10-CM | POA: Diagnosis not present

## 2017-07-01 DIAGNOSIS — K7469 Other cirrhosis of liver: Secondary | ICD-10-CM | POA: Diagnosis not present

## 2017-07-01 DIAGNOSIS — M25562 Pain in left knee: Secondary | ICD-10-CM | POA: Diagnosis not present

## 2017-07-01 DIAGNOSIS — Z0000 Encounter for general adult medical examination without abnormal findings: Secondary | ICD-10-CM | POA: Diagnosis not present

## 2017-07-01 DIAGNOSIS — I10 Essential (primary) hypertension: Secondary | ICD-10-CM | POA: Diagnosis not present

## 2017-07-03 DIAGNOSIS — M25562 Pain in left knee: Secondary | ICD-10-CM | POA: Diagnosis not present

## 2017-07-03 DIAGNOSIS — M6281 Muscle weakness (generalized): Secondary | ICD-10-CM | POA: Diagnosis not present

## 2017-07-03 DIAGNOSIS — Z96652 Presence of left artificial knee joint: Secondary | ICD-10-CM | POA: Diagnosis not present

## 2017-07-03 DIAGNOSIS — Z21 Asymptomatic human immunodeficiency virus [HIV] infection status: Secondary | ICD-10-CM | POA: Diagnosis not present

## 2017-07-03 DIAGNOSIS — R262 Difficulty in walking, not elsewhere classified: Secondary | ICD-10-CM | POA: Diagnosis not present

## 2017-07-03 DIAGNOSIS — Z9889 Other specified postprocedural states: Secondary | ICD-10-CM | POA: Diagnosis not present

## 2017-07-06 DIAGNOSIS — B182 Chronic viral hepatitis C: Secondary | ICD-10-CM | POA: Diagnosis not present

## 2017-07-06 DIAGNOSIS — B20 Human immunodeficiency virus [HIV] disease: Secondary | ICD-10-CM | POA: Diagnosis not present

## 2017-07-07 DIAGNOSIS — R262 Difficulty in walking, not elsewhere classified: Secondary | ICD-10-CM | POA: Diagnosis not present

## 2017-07-07 DIAGNOSIS — M6281 Muscle weakness (generalized): Secondary | ICD-10-CM | POA: Diagnosis not present

## 2017-07-07 DIAGNOSIS — M25562 Pain in left knee: Secondary | ICD-10-CM | POA: Diagnosis not present

## 2017-07-07 DIAGNOSIS — Z9889 Other specified postprocedural states: Secondary | ICD-10-CM | POA: Diagnosis not present

## 2017-07-10 DIAGNOSIS — Z9889 Other specified postprocedural states: Secondary | ICD-10-CM | POA: Diagnosis not present

## 2017-07-10 DIAGNOSIS — M25562 Pain in left knee: Secondary | ICD-10-CM | POA: Diagnosis not present

## 2017-07-10 DIAGNOSIS — R262 Difficulty in walking, not elsewhere classified: Secondary | ICD-10-CM | POA: Diagnosis not present

## 2017-07-10 DIAGNOSIS — M6281 Muscle weakness (generalized): Secondary | ICD-10-CM | POA: Diagnosis not present

## 2017-07-17 DIAGNOSIS — M25562 Pain in left knee: Secondary | ICD-10-CM | POA: Diagnosis not present

## 2017-07-17 DIAGNOSIS — M6281 Muscle weakness (generalized): Secondary | ICD-10-CM | POA: Diagnosis not present

## 2017-07-17 DIAGNOSIS — Z9889 Other specified postprocedural states: Secondary | ICD-10-CM | POA: Diagnosis not present

## 2017-07-17 DIAGNOSIS — R262 Difficulty in walking, not elsewhere classified: Secondary | ICD-10-CM | POA: Diagnosis not present

## 2017-07-21 DIAGNOSIS — M25562 Pain in left knee: Secondary | ICD-10-CM | POA: Diagnosis not present

## 2017-07-21 DIAGNOSIS — R262 Difficulty in walking, not elsewhere classified: Secondary | ICD-10-CM | POA: Diagnosis not present

## 2017-07-21 DIAGNOSIS — Z9889 Other specified postprocedural states: Secondary | ICD-10-CM | POA: Diagnosis not present

## 2017-07-21 DIAGNOSIS — M6281 Muscle weakness (generalized): Secondary | ICD-10-CM | POA: Diagnosis not present

## 2017-07-24 DIAGNOSIS — M25562 Pain in left knee: Secondary | ICD-10-CM | POA: Diagnosis not present

## 2017-07-24 DIAGNOSIS — Z9889 Other specified postprocedural states: Secondary | ICD-10-CM | POA: Diagnosis not present

## 2017-07-24 DIAGNOSIS — M6281 Muscle weakness (generalized): Secondary | ICD-10-CM | POA: Diagnosis not present

## 2017-07-24 DIAGNOSIS — R262 Difficulty in walking, not elsewhere classified: Secondary | ICD-10-CM | POA: Diagnosis not present

## 2017-07-27 DIAGNOSIS — I700 Atherosclerosis of aorta: Secondary | ICD-10-CM | POA: Diagnosis not present

## 2017-07-27 DIAGNOSIS — B20 Human immunodeficiency virus [HIV] disease: Secondary | ICD-10-CM | POA: Diagnosis not present

## 2017-07-27 DIAGNOSIS — I77811 Abdominal aortic ectasia: Secondary | ICD-10-CM | POA: Diagnosis not present

## 2017-07-27 DIAGNOSIS — Z87891 Personal history of nicotine dependence: Secondary | ICD-10-CM | POA: Diagnosis not present

## 2017-07-27 DIAGNOSIS — M810 Age-related osteoporosis without current pathological fracture: Secondary | ICD-10-CM | POA: Diagnosis not present

## 2017-07-27 DIAGNOSIS — Z136 Encounter for screening for cardiovascular disorders: Secondary | ICD-10-CM | POA: Diagnosis not present

## 2017-07-29 DIAGNOSIS — M25562 Pain in left knee: Secondary | ICD-10-CM | POA: Diagnosis not present

## 2017-07-29 DIAGNOSIS — M6281 Muscle weakness (generalized): Secondary | ICD-10-CM | POA: Diagnosis not present

## 2017-07-29 DIAGNOSIS — Z9889 Other specified postprocedural states: Secondary | ICD-10-CM | POA: Diagnosis not present

## 2017-07-29 DIAGNOSIS — R262 Difficulty in walking, not elsewhere classified: Secondary | ICD-10-CM | POA: Diagnosis not present

## 2017-07-31 DIAGNOSIS — M25562 Pain in left knee: Secondary | ICD-10-CM | POA: Diagnosis not present

## 2017-07-31 DIAGNOSIS — M6281 Muscle weakness (generalized): Secondary | ICD-10-CM | POA: Diagnosis not present

## 2017-07-31 DIAGNOSIS — R262 Difficulty in walking, not elsewhere classified: Secondary | ICD-10-CM | POA: Diagnosis not present

## 2017-07-31 DIAGNOSIS — Z9889 Other specified postprocedural states: Secondary | ICD-10-CM | POA: Diagnosis not present

## 2017-08-03 DIAGNOSIS — Z96652 Presence of left artificial knee joint: Secondary | ICD-10-CM | POA: Diagnosis not present

## 2017-08-03 DIAGNOSIS — M25562 Pain in left knee: Secondary | ICD-10-CM | POA: Diagnosis not present

## 2017-08-03 DIAGNOSIS — R262 Difficulty in walking, not elsewhere classified: Secondary | ICD-10-CM | POA: Diagnosis not present

## 2017-08-03 DIAGNOSIS — M6281 Muscle weakness (generalized): Secondary | ICD-10-CM | POA: Diagnosis not present

## 2017-08-03 DIAGNOSIS — Z471 Aftercare following joint replacement surgery: Secondary | ICD-10-CM | POA: Diagnosis not present

## 2017-08-05 DIAGNOSIS — R262 Difficulty in walking, not elsewhere classified: Secondary | ICD-10-CM | POA: Diagnosis not present

## 2017-08-05 DIAGNOSIS — M25562 Pain in left knee: Secondary | ICD-10-CM | POA: Diagnosis not present

## 2017-08-05 DIAGNOSIS — Z471 Aftercare following joint replacement surgery: Secondary | ICD-10-CM | POA: Diagnosis not present

## 2017-08-05 DIAGNOSIS — M6281 Muscle weakness (generalized): Secondary | ICD-10-CM | POA: Diagnosis not present

## 2017-08-05 DIAGNOSIS — Z96652 Presence of left artificial knee joint: Secondary | ICD-10-CM | POA: Diagnosis not present

## 2017-08-11 DIAGNOSIS — R262 Difficulty in walking, not elsewhere classified: Secondary | ICD-10-CM | POA: Diagnosis not present

## 2017-08-11 DIAGNOSIS — M25562 Pain in left knee: Secondary | ICD-10-CM | POA: Diagnosis not present

## 2017-08-11 DIAGNOSIS — Z96652 Presence of left artificial knee joint: Secondary | ICD-10-CM | POA: Diagnosis not present

## 2017-08-11 DIAGNOSIS — Z471 Aftercare following joint replacement surgery: Secondary | ICD-10-CM | POA: Diagnosis not present

## 2017-08-11 DIAGNOSIS — M6281 Muscle weakness (generalized): Secondary | ICD-10-CM | POA: Diagnosis not present

## 2017-08-13 DIAGNOSIS — M25562 Pain in left knee: Secondary | ICD-10-CM | POA: Diagnosis not present

## 2017-08-13 DIAGNOSIS — Z471 Aftercare following joint replacement surgery: Secondary | ICD-10-CM | POA: Diagnosis not present

## 2017-08-13 DIAGNOSIS — R262 Difficulty in walking, not elsewhere classified: Secondary | ICD-10-CM | POA: Diagnosis not present

## 2017-08-13 DIAGNOSIS — Z96652 Presence of left artificial knee joint: Secondary | ICD-10-CM | POA: Diagnosis not present

## 2017-08-13 DIAGNOSIS — M6281 Muscle weakness (generalized): Secondary | ICD-10-CM | POA: Diagnosis not present

## 2017-08-18 DIAGNOSIS — R262 Difficulty in walking, not elsewhere classified: Secondary | ICD-10-CM | POA: Diagnosis not present

## 2017-08-18 DIAGNOSIS — M6281 Muscle weakness (generalized): Secondary | ICD-10-CM | POA: Diagnosis not present

## 2017-08-18 DIAGNOSIS — M25562 Pain in left knee: Secondary | ICD-10-CM | POA: Diagnosis not present

## 2017-08-18 DIAGNOSIS — Z96652 Presence of left artificial knee joint: Secondary | ICD-10-CM | POA: Diagnosis not present

## 2017-08-18 DIAGNOSIS — Z471 Aftercare following joint replacement surgery: Secondary | ICD-10-CM | POA: Diagnosis not present

## 2017-08-25 DIAGNOSIS — Z96652 Presence of left artificial knee joint: Secondary | ICD-10-CM | POA: Diagnosis not present

## 2017-08-25 DIAGNOSIS — Z471 Aftercare following joint replacement surgery: Secondary | ICD-10-CM | POA: Diagnosis not present

## 2017-08-25 DIAGNOSIS — M25562 Pain in left knee: Secondary | ICD-10-CM | POA: Diagnosis not present

## 2017-08-25 DIAGNOSIS — R262 Difficulty in walking, not elsewhere classified: Secondary | ICD-10-CM | POA: Diagnosis not present

## 2017-08-25 DIAGNOSIS — M6281 Muscle weakness (generalized): Secondary | ICD-10-CM | POA: Diagnosis not present

## 2017-08-27 DIAGNOSIS — M25562 Pain in left knee: Secondary | ICD-10-CM | POA: Diagnosis not present

## 2017-09-01 DIAGNOSIS — R262 Difficulty in walking, not elsewhere classified: Secondary | ICD-10-CM | POA: Diagnosis not present

## 2017-09-01 DIAGNOSIS — Z471 Aftercare following joint replacement surgery: Secondary | ICD-10-CM | POA: Diagnosis not present

## 2017-09-01 DIAGNOSIS — Z96652 Presence of left artificial knee joint: Secondary | ICD-10-CM | POA: Diagnosis not present

## 2017-09-01 DIAGNOSIS — M25562 Pain in left knee: Secondary | ICD-10-CM | POA: Diagnosis not present

## 2017-09-01 DIAGNOSIS — M6281 Muscle weakness (generalized): Secondary | ICD-10-CM | POA: Diagnosis not present

## 2017-09-08 DIAGNOSIS — R262 Difficulty in walking, not elsewhere classified: Secondary | ICD-10-CM | POA: Diagnosis not present

## 2017-09-08 DIAGNOSIS — Z9889 Other specified postprocedural states: Secondary | ICD-10-CM | POA: Diagnosis not present

## 2017-09-08 DIAGNOSIS — M25562 Pain in left knee: Secondary | ICD-10-CM | POA: Diagnosis not present

## 2017-09-08 DIAGNOSIS — M6281 Muscle weakness (generalized): Secondary | ICD-10-CM | POA: Diagnosis not present

## 2017-09-08 DIAGNOSIS — Z21 Asymptomatic human immunodeficiency virus [HIV] infection status: Secondary | ICD-10-CM | POA: Diagnosis not present

## 2017-09-10 DIAGNOSIS — Z9889 Other specified postprocedural states: Secondary | ICD-10-CM | POA: Diagnosis not present

## 2017-09-10 DIAGNOSIS — Z21 Asymptomatic human immunodeficiency virus [HIV] infection status: Secondary | ICD-10-CM | POA: Diagnosis not present

## 2017-09-10 DIAGNOSIS — M25562 Pain in left knee: Secondary | ICD-10-CM | POA: Diagnosis not present

## 2017-09-10 DIAGNOSIS — M6281 Muscle weakness (generalized): Secondary | ICD-10-CM | POA: Diagnosis not present

## 2017-09-10 DIAGNOSIS — R262 Difficulty in walking, not elsewhere classified: Secondary | ICD-10-CM | POA: Diagnosis not present

## 2017-09-14 NOTE — Patient Instructions (Addendum)
Logan Levine  09/14/2017   Your procedure is scheduled on: 09-23-17   Report to [facility] Main  Entrance    Report to admitting at 10:30AM    Call this number if you have problems the morning of surgery [phone]     Remember: Do not eat food After Midnight. YOU MAY HAVE CLEAR LIQUIDS FROM MIDNIGHT UNTIL 7:00AM. NOTHING BY MOUTH AFTER  7:00AM!   CLEAR LIQUID DIET   Foods Allowed                                                                     Foods Excluded  Coffee and tea, regular and decaf                             liquids that you cannot  Plain Jell-O in any flavor                                             see through such as: Fruit ices (not with fruit pulp)                                     milk, soups, orange juice  Iced Popsicles                                    All solid food Carbonated beverages, regular and diet                                    Cranberry, grape and apple juices Sports drinks like Gatorade Lightly seasoned clear broth or consume(fat free) Sugar, honey syrup  Sample Menu Breakfast                                Lunch                                     Supper Cranberry juice                    Beef broth                            Chicken broth Jell-O                                     Grape juice                           Apple juice Coffee or tea  Jell-O                                      Popsicle                                                Coffee or tea                        Coffee or tea  _____________________________________________________________________       Take these medicines the morning of surgery with A SIP OF WATER: DESCOVY, ISENTRESS, QVAR INHALER, ALBUTEROL INHALER IF NEEDED, BACTRIM, TAMSULOSIN, TYLENOL IF NEEDED                                 You may not have any metal on your body including hair pins and              piercings  Do not wear jewelry,  make-up, lotions, powders or perfumes, deodorant                  Men may shave face and neck.   Do not bring valuables to the hospital. [place].  Contacts, dentures or bridgework may not be worn into surgery.  Leave suitcase in the car. After surgery it may be brought to your room.                 Please read over the following fact sheets you were given: _____________________________________________________________________             [facility] - Preparing for Surgery Before surgery, you can play an important role.  Because skin is not sterile, your skin needs to be as free of germs as possible.  You can reduce the number of germs on your skin by washing with CHG (chlorahexidine gluconate) soap before surgery.  CHG is an antiseptic cleaner which kills germs and bonds with the skin to continue killing germs even after washing. Please DO NOT use if you have an allergy to CHG or antibacterial soaps.  If your skin becomes reddened/irritated stop using the CHG and inform your nurse when you arrive at Short Stay. Do not shave (including legs and underarms) for at least 48 hours prior to the first CHG shower.  You may shave your face/neck. Please follow these instructions carefully:  1.  Shower with CHG Soap the night before surgery and the  morning of Surgery.  2.  If you choose to wash your hair, wash your hair first as usual with your  normal  shampoo.  3.  After you shampoo, rinse your hair and body thoroughly to remove the  shampoo.                           4.  Use CHG as you would any other liquid soap.  You can apply chg directly  to the skin and wash                       Gently  with a scrungie or clean washcloth.  5.  Apply the CHG Soap to your body ONLY FROM THE NECK DOWN.   Do not use on face/ open                           Wound or open sores. Avoid contact with eyes, ears mouth and genitals (private parts).                        Wash face,  Genitals (private parts) with your normal soap.             6.  Wash thoroughly, paying special attention to the area where your surgery  will be performed.  7.  Thoroughly rinse your body with warm water from the neck down.  8.  DO NOT shower/wash with your normal soap after using and rinsing off  the CHG Soap.                9.  Pat yourself dry with a clean towel.            10.  Wear clean pajamas.            11.  Place clean sheets on your bed the night of your first shower and do not  sleep with pets. Day of Surgery : Do not apply any lotions/deodorants the morning of surgery.  Please wear clean clothes to the hospital/surgery center.  FAILURE TO FOLLOW THESE INSTRUCTIONS MAY RESULT IN THE CANCELLATION OF YOUR SURGERY PATIENT SIGNATURE_________________________________  NURSE SIGNATURE__________________________________  ________________________________________________________________________   Logan Levine  An incentive spirometer is a tool that can help keep your lungs clear and active. This tool measures how well you are filling your lungs with each breath. Taking long deep breaths may help reverse or decrease the chance of developing breathing (pulmonary) problems (especially infection) following:  A long period of time when you are unable to move or be active. BEFORE THE PROCEDURE   If the spirometer includes an indicator to show your best effort, your nurse or respiratory therapist will set it to a desired goal.  If possible, sit up straight or lean slightly forward. Try not to slouch.  Hold the incentive spirometer in an upright position. INSTRUCTIONS FOR USE  1. Sit on the edge of your bed if possible, or sit up as far as you can in bed or on a chair. 2. Hold the incentive spirometer in an upright position. 3. Breathe out normally. 4. Place the mouthpiece in your mouth and seal your lips tightly around it. 5. Breathe in slowly and as deeply as  possible, raising the piston or the ball toward the top of the column. 6. Hold your breath for 3-5 seconds or for as long as possible. Allow the piston or ball to fall to the bottom of the column. 7. Remove the mouthpiece from your mouth and breathe out normally. 8. Rest for a few seconds and repeat Steps 1 through 7 at least 10 times every 1-2 hours when you are awake. Take your time and take a few normal breaths between deep breaths. 9. The spirometer may include an indicator to show your best effort. Use the indicator as a goal to work toward during each repetition. 10. After each set of 10 deep breaths, practice coughing to be sure your lungs are clear. If you have an incision (the cut made at the time of surgery),  support your incision when coughing by placing a pillow or rolled up towels firmly against it. Once you are able to get out of bed, walk around indoors and cough well. You may stop using the incentive spirometer when instructed by your caregiver.  RISKS AND COMPLICATIONS  Take your time so you do not get dizzy or light-headed.  If you are in pain, you may need to take or ask for pain medication before doing incentive spirometry. It is harder to take a deep breath if you are having pain. AFTER USE  Rest and breathe slowly and easily.  It can be helpful to keep track of a log of your progress. Your caregiver can provide you with a simple table to help with this. If you are using the spirometer at home, follow these instructions: [place] IF:   You are having difficultly using the spirometer.  You have trouble using the spirometer as often as instructed.  Your pain medication is not giving enough relief while using the spirometer.  You develop fever of 100.5 F (38.1 C) or higher. SEEK IMMEDIATE MEDICAL CARE IF:   You cough up bloody sputum that had not been present before.  You develop fever of 102 F (38.9 C) or greater.  You develop worsening pain at or near  the incision site. MAKE SURE YOU:   Understand these instructions.  Will watch your condition.  Will get help right away if you are not doing well or get worse. Document Released: 06/02/2006 Document Revised: 04/14/2011 Document Reviewed: 08/03/2006 Exit[employer] Patient Information 2014 Exit[employer], [place].   ________________________________________________________________________  WHAT IS A BLOOD TRANSFUSION? Blood Transfusion Information  A transfusion is the replacement of blood or some of its parts. Blood is made up of multiple cells which provide different functions.  Red blood cells carry oxygen and are used for blood loss replacement.  White blood cells fight against infection.  Platelets control bleeding.  Plasma helps clot blood.  Other blood products are available for specialized needs, such as hemophilia or other clotting disorders. BEFORE THE TRANSFUSION  Who gives blood for transfusions?   Healthy volunteers who are fully evaluated to make sure their blood is safe. This is blood bank blood. Transfusion therapy is the safest it has ever been in the practice of medicine. Before blood is taken from a donor, a complete history is taken to make sure that person has no history of diseases nor engages in risky social behavior (examples are intravenous drug use or sexual activity with multiple partners). The donor's travel history is screened to minimize risk of transmitting infections, such as malaria. The donated blood is tested for signs of infectious diseases, such as HIV and hepatitis. The blood is then tested to be sure it is compatible with you in order to minimize the chance of a transfusion reaction. If you or a relative donates blood, this is often done in anticipation of surgery and is not appropriate for emergency situations. It takes many days to process the donated blood. RISKS AND COMPLICATIONS Although transfusion therapy is very safe and saves many lives, the main  dangers of transfusion include:   Getting an infectious disease.  Developing a transfusion reaction. This is an allergic reaction to something in the blood you were given. Every precaution is taken to prevent this. The decision to have a blood transfusion has been considered carefully by your caregiver before blood is given. Blood is not given unless the benefits outweigh the risks. AFTER THE TRANSFUSION  Right after receiving a blood transfusion, you will usually feel much better and more energetic. This is especially true if your red blood cells have gotten low (anemic). The transfusion raises the level of the red blood cells which carry oxygen, and this usually causes an energy increase.  The nurse administering the transfusion will monitor you carefully for complications. HOME CARE INSTRUCTIONS  No special instructions are needed after a transfusion. You may find your energy is better. Speak with your caregiver about any limitations on activity for underlying diseases you may have. SEEK MEDICAL CARE IF:   Your condition is not improving after your transfusion.  You develop redness or irritation at the intravenous (IV) site. SEEK IMMEDIATE MEDICAL CARE IF:  Any of the following symptoms occur over the next 12 hours:  Shaking chills.  You have a temperature by mouth above 102 F (38.9 C), not controlled by medicine.  Chest, back, or muscle pain.  People around you feel you are not acting correctly or are confused.  Shortness of breath or difficulty breathing.  Dizziness and fainting.  You get a rash or develop hives.  You have a decrease in urine output.  Your urine turns a dark color or changes to pink, red, or brown. Any of the following symptoms occur over the next 10 days:  You have a temperature by mouth above 102 F (38.9 C), not controlled by medicine.  Shortness of breath.  Weakness after normal activity.  The white part of the eye turns yellow  (jaundice).  You have a decrease in the amount of urine or are urinating less often.  Your urine turns a dark color or changes to pink, red, or brown. Document Released: 01/18/2000 Document Revised: 04/14/2011 Document Reviewed: 09/06/2007 [facility] Patient Information 2014 [place], [place].  _______________________________________________________________________

## 2017-09-14 NOTE — Progress Notes (Addendum)
EKG 04-30-17 Epic    SURGICAL CLEARANCE , XAJE HASANAJ 08-27-17 ON CHART

## 2017-09-15 ENCOUNTER — Encounter (HOSPITAL_COMMUNITY): Payer: Self-pay

## 2017-09-15 ENCOUNTER — Encounter (HOSPITAL_COMMUNITY)
Admission: RE | Admit: 2017-09-15 | Discharge: 2017-09-15 | Disposition: A | Discharge status: Home / Self Care | Payer: Medicare Other | Source: Ambulatory Visit | Attending: Orthopedic Surgery | Admitting: Orthopedic Surgery

## 2017-09-15 ENCOUNTER — Other Ambulatory Visit: Payer: Self-pay

## 2017-09-15 DIAGNOSIS — Z01812 Encounter for preprocedural laboratory examination: Secondary | ICD-10-CM | POA: Diagnosis not present

## 2017-09-15 LAB — SURGICAL PCR SCREEN
MRSA, PCR: NEGATIVE
STAPHYLOCOCCUS AUREUS: NEGATIVE

## 2017-09-15 LAB — URINALYSIS, ROUTINE W REFLEX MICROSCOPIC
Bilirubin Urine: NEGATIVE
Glucose, UA: NEGATIVE mg/dL
Hgb urine dipstick: NEGATIVE
Ketones, ur: NEGATIVE mg/dL
Nitrite: NEGATIVE
PH: 5 (ref 5.0–8.0)
Protein, ur: NEGATIVE mg/dL
SPECIFIC GRAVITY, URINE: 1.019 (ref 1.005–1.030)

## 2017-09-15 LAB — COMPREHENSIVE METABOLIC PANEL
ALT: 60 U/L — ABNORMAL HIGH (ref 0–44)
AST: 83 U/L — AB (ref 15–41)
Albumin: 3.5 g/dL (ref 3.5–5.0)
Alkaline Phosphatase: 162 U/L — ABNORMAL HIGH (ref 38–126)
Anion gap: 6 (ref 5–15)
BILIRUBIN TOTAL: 0.7 mg/dL (ref 0.3–1.2)
BUN: 29 mg/dL — AB (ref 8–23)
CHLORIDE: 107 mmol/L (ref 98–111)
CO2: 24 mmol/L (ref 22–32)
Calcium: 9.6 mg/dL (ref 8.9–10.3)
Creatinine, Ser: 1.6 mg/dL — ABNORMAL HIGH (ref 0.61–1.24)
GFR calc Af Amer: 50 mL/min — ABNORMAL LOW (ref >60)
GFR calc non Af Amer: 43 mL/min — ABNORMAL LOW (ref >60)
Glucose, Bld: 126 mg/dL — ABNORMAL HIGH (ref 70–99)
POTASSIUM: 4.7 mmol/L (ref 3.5–5.1)
Sodium: 137 mmol/L (ref 135–145)
TOTAL PROTEIN: 9.1 g/dL — AB (ref 6.5–8.1)

## 2017-09-15 LAB — CBC
HEMATOCRIT: 36.5 % — AB (ref 39.0–52.0)
Hemoglobin: 12.2 g/dL — ABNORMAL LOW (ref 13.0–17.0)
MCH: 29.3 pg (ref 26.0–34.0)
MCHC: 33.4 g/dL (ref 30.0–36.0)
MCV: 87.5 fL (ref 78.0–100.0)
Platelets: 165 10*3/uL (ref 150–400)
RBC: 4.17 MIL/uL — ABNORMAL LOW (ref 4.22–5.81)
RDW: 17 % — AB (ref 11.5–15.5)
WBC: 4.1 10*3/uL (ref 4.0–10.5)

## 2017-09-15 LAB — PROTIME-INR
INR: 1.23
Prothrombin Time: 15.4 seconds — ABNORMAL HIGH (ref 11.4–15.2)

## 2017-09-15 LAB — APTT: APTT: 33 s (ref 24–36)

## 2017-09-17 DIAGNOSIS — M6281 Muscle weakness (generalized): Secondary | ICD-10-CM | POA: Diagnosis not present

## 2017-09-17 DIAGNOSIS — Z21 Asymptomatic human immunodeficiency virus [HIV] infection status: Secondary | ICD-10-CM | POA: Diagnosis not present

## 2017-09-17 DIAGNOSIS — R262 Difficulty in walking, not elsewhere classified: Secondary | ICD-10-CM | POA: Diagnosis not present

## 2017-09-17 DIAGNOSIS — M25562 Pain in left knee: Secondary | ICD-10-CM | POA: Diagnosis not present

## 2017-09-17 DIAGNOSIS — Z9889 Other specified postprocedural states: Secondary | ICD-10-CM | POA: Diagnosis not present

## 2017-09-22 MED ORDER — TRANEXAMIC ACID 1000 MG/10ML IV SOLN
2000.0000 mg | INTRAVENOUS | Status: DC
  Filled 2017-09-22: qty 20

## 2017-09-22 MED ORDER — BUPIVACAINE LIPOSOME 1.3 % IJ SUSP
20.0000 mL | Freq: Once | INTRAMUSCULAR | Status: DC
  Filled 2017-09-22: qty 20
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
## 2017-09-23 ENCOUNTER — Encounter (HOSPITAL_COMMUNITY): Payer: Self-pay

## 2017-09-23 ENCOUNTER — Inpatient Hospital Stay (HOSPITAL_COMMUNITY): Payer: Medicare Other | Admitting: Anesthesiology

## 2017-09-23 ENCOUNTER — Other Ambulatory Visit: Payer: Self-pay

## 2017-09-23 ENCOUNTER — Inpatient Hospital Stay (HOSPITAL_COMMUNITY)
Admission: RE | Admit: 2017-09-23 | Discharge: 2017-09-26 | DRG: 467 | Disposition: A | Discharge status: Skilled Nursing Facility | Payer: Medicare Other | Source: Ambulatory Visit | Attending: Orthopedic Surgery | Admitting: Orthopedic Surgery

## 2017-09-23 ENCOUNTER — Encounter (HOSPITAL_COMMUNITY)
Admission: RE | Disposition: A | Discharge status: Skilled Nursing Facility | Payer: Self-pay | Source: Ambulatory Visit | Attending: Orthopedic Surgery

## 2017-09-23 DIAGNOSIS — Z7982 Long term (current) use of aspirin: Secondary | ICD-10-CM

## 2017-09-23 DIAGNOSIS — T8454XA Infection and inflammatory reaction due to internal left knee prosthesis, initial encounter: Principal | ICD-10-CM | POA: Diagnosis present

## 2017-09-23 DIAGNOSIS — J410 Simple chronic bronchitis: Secondary | ICD-10-CM | POA: Diagnosis not present

## 2017-09-23 DIAGNOSIS — G8918 Other acute postprocedural pain: Secondary | ICD-10-CM | POA: Diagnosis not present

## 2017-09-23 DIAGNOSIS — R61 Generalized hyperhidrosis: Secondary | ICD-10-CM | POA: Diagnosis present

## 2017-09-23 DIAGNOSIS — Z79899 Other long term (current) drug therapy: Secondary | ICD-10-CM | POA: Diagnosis not present

## 2017-09-23 DIAGNOSIS — Y792 Prosthetic and other implants, materials and accessory orthopedic devices associated with adverse incidents: Secondary | ICD-10-CM | POA: Diagnosis present

## 2017-09-23 DIAGNOSIS — I10 Essential (primary) hypertension: Secondary | ICD-10-CM | POA: Diagnosis present

## 2017-09-23 DIAGNOSIS — M109 Gout, unspecified: Secondary | ICD-10-CM | POA: Diagnosis not present

## 2017-09-23 DIAGNOSIS — N401 Enlarged prostate with lower urinary tract symptoms: Secondary | ICD-10-CM | POA: Diagnosis present

## 2017-09-23 DIAGNOSIS — L03116 Cellulitis of left lower limb: Secondary | ICD-10-CM | POA: Diagnosis not present

## 2017-09-23 DIAGNOSIS — E669 Obesity, unspecified: Secondary | ICD-10-CM | POA: Diagnosis present

## 2017-09-23 DIAGNOSIS — B1089 Other human herpesvirus infection: Secondary | ICD-10-CM | POA: Diagnosis present

## 2017-09-23 DIAGNOSIS — Z7951 Long term (current) use of inhaled steroids: Secondary | ICD-10-CM

## 2017-09-23 DIAGNOSIS — K429 Umbilical hernia without obstruction or gangrene: Secondary | ICD-10-CM | POA: Diagnosis not present

## 2017-09-23 DIAGNOSIS — K7460 Unspecified cirrhosis of liver: Secondary | ICD-10-CM | POA: Diagnosis present

## 2017-09-23 DIAGNOSIS — Y834 Other reconstructive surgery as the cause of abnormal reaction of the patient, or of later complication, without mention of misadventure at the time of the procedure: Secondary | ICD-10-CM | POA: Diagnosis present

## 2017-09-23 DIAGNOSIS — F0390 Unspecified dementia without behavioral disturbance: Secondary | ICD-10-CM | POA: Diagnosis not present

## 2017-09-23 DIAGNOSIS — Z9181 History of falling: Secondary | ICD-10-CM | POA: Diagnosis not present

## 2017-09-23 DIAGNOSIS — K219 Gastro-esophageal reflux disease without esophagitis: Secondary | ICD-10-CM | POA: Diagnosis not present

## 2017-09-23 DIAGNOSIS — M659 Synovitis and tenosynovitis, unspecified: Secondary | ICD-10-CM | POA: Diagnosis present

## 2017-09-23 DIAGNOSIS — B182 Chronic viral hepatitis C: Secondary | ICD-10-CM | POA: Diagnosis present

## 2017-09-23 DIAGNOSIS — Z792 Long term (current) use of antibiotics: Secondary | ICD-10-CM

## 2017-09-23 DIAGNOSIS — T84033A Mechanical loosening of internal left knee prosthetic joint, initial encounter: Secondary | ICD-10-CM | POA: Diagnosis not present

## 2017-09-23 DIAGNOSIS — B079 Viral wart, unspecified: Secondary | ICD-10-CM | POA: Diagnosis not present

## 2017-09-23 DIAGNOSIS — Z6825 Body mass index (BMI) 25.0-25.9, adult: Secondary | ICD-10-CM

## 2017-09-23 DIAGNOSIS — R350 Frequency of micturition: Secondary | ICD-10-CM | POA: Diagnosis present

## 2017-09-23 DIAGNOSIS — M1990 Unspecified osteoarthritis, unspecified site: Secondary | ICD-10-CM | POA: Diagnosis not present

## 2017-09-23 DIAGNOSIS — Z21 Asymptomatic human immunodeficiency virus [HIV] infection status: Secondary | ICD-10-CM | POA: Diagnosis not present

## 2017-09-23 DIAGNOSIS — Z8619 Personal history of other infectious and parasitic diseases: Secondary | ICD-10-CM

## 2017-09-23 DIAGNOSIS — Z7401 Bed confinement status: Secondary | ICD-10-CM | POA: Diagnosis not present

## 2017-09-23 DIAGNOSIS — M791 Myalgia, unspecified site: Secondary | ICD-10-CM | POA: Diagnosis present

## 2017-09-23 DIAGNOSIS — A6002 Herpesviral infection of other male genital organs: Secondary | ICD-10-CM | POA: Diagnosis not present

## 2017-09-23 DIAGNOSIS — F17210 Nicotine dependence, cigarettes, uncomplicated: Secondary | ICD-10-CM | POA: Diagnosis not present

## 2017-09-23 DIAGNOSIS — D649 Anemia, unspecified: Secondary | ICD-10-CM | POA: Diagnosis present

## 2017-09-23 DIAGNOSIS — Z96652 Presence of left artificial knee joint: Secondary | ICD-10-CM | POA: Diagnosis not present

## 2017-09-23 DIAGNOSIS — B20 Human immunodeficiency virus [HIV] disease: Secondary | ICD-10-CM | POA: Diagnosis not present

## 2017-09-23 DIAGNOSIS — K5900 Constipation, unspecified: Secondary | ICD-10-CM | POA: Diagnosis present

## 2017-09-23 DIAGNOSIS — E785 Hyperlipidemia, unspecified: Secondary | ICD-10-CM | POA: Diagnosis not present

## 2017-09-23 DIAGNOSIS — J449 Chronic obstructive pulmonary disease, unspecified: Secondary | ICD-10-CM | POA: Diagnosis present

## 2017-09-23 DIAGNOSIS — A539 Syphilis, unspecified: Secondary | ICD-10-CM | POA: Diagnosis not present

## 2017-09-23 DIAGNOSIS — M2550 Pain in unspecified joint: Secondary | ICD-10-CM | POA: Diagnosis not present

## 2017-09-23 DIAGNOSIS — Z471 Aftercare following joint replacement surgery: Secondary | ICD-10-CM | POA: Diagnosis not present

## 2017-09-23 DIAGNOSIS — R2689 Other abnormalities of gait and mobility: Secondary | ICD-10-CM | POA: Diagnosis not present

## 2017-09-23 DIAGNOSIS — M009 Pyogenic arthritis, unspecified: Secondary | ICD-10-CM | POA: Diagnosis present

## 2017-09-23 DIAGNOSIS — H9190 Unspecified hearing loss, unspecified ear: Secondary | ICD-10-CM | POA: Diagnosis present

## 2017-09-23 DIAGNOSIS — M6281 Muscle weakness (generalized): Secondary | ICD-10-CM | POA: Diagnosis not present

## 2017-09-23 HISTORY — PX: REIMPLANTATION OF TOTAL KNEE: SHX6052

## 2017-09-23 LAB — TYPE AND SCREEN
ABO/RH(D): O POS
ANTIBODY SCREEN: NEGATIVE

## 2017-09-23 SURGERY — REVISION, TOTAL ARTHROPLASTY, KNEE
Anesthesia: Regional | Site: Knee | Laterality: Left

## 2017-09-23 MED ORDER — PHENYLEPHRINE HCL 10 MG/ML IJ SOLN
INTRAMUSCULAR | Status: AC
  Filled 2017-09-23: qty 1

## 2017-09-23 MED ORDER — POLYETHYLENE GLYCOL 3350 17 G PO PACK: 17.0000 g | PACK | Freq: Every day | ORAL | Status: DC | PRN

## 2017-09-23 MED ORDER — ACETAMINOPHEN 500 MG PO TABS
1000.0000 mg | ORAL_TABLET | Freq: Four times a day (QID) | ORAL | Status: AC
  Administered 2017-09-23 – 2017-09-24 (×2): 1000 mg via ORAL
  Filled 2017-09-23 (×2): qty 2

## 2017-09-23 MED ORDER — FENTANYL CITRATE (PF) 100 MCG/2ML IJ SOLN
INTRAMUSCULAR | Status: AC
  Administered 2017-09-23: 50 ug via INTRAVENOUS
  Filled 2017-09-23: qty 2

## 2017-09-23 MED ORDER — METHOCARBAMOL 500 MG PO TABS
500.0000 mg | ORAL_TABLET | Freq: Four times a day (QID) | ORAL | Status: DC | PRN
  Administered 2017-09-23 – 2017-09-25 (×2): 500 mg via ORAL
  Filled 2017-09-23 (×2): qty 1

## 2017-09-23 MED ORDER — DEXAMETHASONE SODIUM PHOSPHATE 10 MG/ML IJ SOLN
8.0000 mg | Freq: Once | INTRAMUSCULAR | Status: AC
  Administered 2017-09-23: 8 mg via INTRAVENOUS

## 2017-09-23 MED ORDER — DEXAMETHASONE SODIUM PHOSPHATE 10 MG/ML IJ SOLN
10.0000 mg | Freq: Once | INTRAMUSCULAR | Status: AC
  Administered 2017-09-24: 10 mg via INTRAVENOUS
  Filled 2017-09-23: qty 1

## 2017-09-23 MED ORDER — BUPIVACAINE LIPOSOME 1.3 % IJ SUSP
INTRAMUSCULAR | Status: DC | PRN
  Administered 2017-09-23: 20 mL

## 2017-09-23 MED ORDER — CHLORHEXIDINE GLUCONATE 4 % EX LIQD: 60.0000 mL | Freq: Once | CUTANEOUS | Status: DC

## 2017-09-23 MED ORDER — ONDANSETRON HCL 4 MG PO TABS: 4.0000 mg | ORAL_TABLET | Freq: Four times a day (QID) | ORAL | Status: DC | PRN

## 2017-09-23 MED ORDER — BISACODYL 10 MG RE SUPP: 10.0000 mg | Freq: Every day | RECTAL | Status: DC | PRN

## 2017-09-23 MED ORDER — VALACYCLOVIR HCL 500 MG PO TABS
500.0000 mg | ORAL_TABLET | Freq: Every day | ORAL | Status: DC
  Administered 2017-09-24 – 2017-09-26 (×3): 500 mg via ORAL
  Filled 2017-09-23 (×3): qty 1

## 2017-09-23 MED ORDER — DEXAMETHASONE SODIUM PHOSPHATE 10 MG/ML IJ SOLN
INTRAMUSCULAR | Status: AC
  Filled 2017-09-23: qty 1

## 2017-09-23 MED ORDER — SODIUM CHLORIDE 0.9 % IV SOLN
INTRAVENOUS | Status: DC
  Administered 2017-09-23: 75 mL/h via INTRAVENOUS

## 2017-09-23 MED ORDER — ALBUTEROL SULFATE (2.5 MG/3ML) 0.083% IN NEBU: 3.0000 mL | INHALATION_SOLUTION | Freq: Four times a day (QID) | RESPIRATORY_TRACT | Status: DC | PRN

## 2017-09-23 MED ORDER — HYDROMORPHONE HCL 1 MG/ML IJ SOLN
0.5000 mg | INTRAMUSCULAR | Status: DC | PRN
  Administered 2017-09-24 (×2): 1 mg via INTRAVENOUS
  Filled 2017-09-23 (×2): qty 1

## 2017-09-23 MED ORDER — ONDANSETRON HCL 4 MG/2ML IJ SOLN
INTRAMUSCULAR | Status: AC
  Filled 2017-09-23: qty 2

## 2017-09-23 MED ORDER — SODIUM CHLORIDE 0.9 % IJ SOLN
INTRAMUSCULAR | Status: AC
  Filled 2017-09-23: qty 10

## 2017-09-23 MED ORDER — GABAPENTIN 300 MG PO CAPS
300.0000 mg | ORAL_CAPSULE | Freq: Three times a day (TID) | ORAL | Status: DC
  Administered 2017-09-23 – 2017-09-26 (×9): 300 mg via ORAL
  Filled 2017-09-23 (×9): qty 1

## 2017-09-23 MED ORDER — CYCLOBENZAPRINE HCL 10 MG PO TABS
10.0000 mg | ORAL_TABLET | Freq: Every day | ORAL | Status: DC
  Administered 2017-09-23 – 2017-09-25 (×3): 10 mg via ORAL
  Filled 2017-09-23 (×3): qty 1

## 2017-09-23 MED ORDER — RALTEGRAVIR POTASSIUM 400 MG PO TABS
400.0000 mg | ORAL_TABLET | Freq: Two times a day (BID) | ORAL | Status: DC
  Administered 2017-09-23 – 2017-09-26 (×6): 400 mg via ORAL
  Filled 2017-09-23 (×6): qty 1

## 2017-09-23 MED ORDER — OXYCODONE HCL 5 MG PO TABS
5.0000 mg | ORAL_TABLET | ORAL | Status: DC | PRN
  Administered 2017-09-23: 5 mg via ORAL
  Administered 2017-09-24 – 2017-09-26 (×5): 10 mg via ORAL
  Filled 2017-09-23 (×2): qty 2
  Filled 2017-09-23: qty 1
  Filled 2017-09-23 (×6): qty 2

## 2017-09-23 MED ORDER — LACTATED RINGERS IV SOLN
INTRAVENOUS | Status: DC
  Administered 2017-09-23 (×2): via INTRAVENOUS
  Administered 2017-09-23: 1000 mL via INTRAVENOUS

## 2017-09-23 MED ORDER — SULFAMETHOXAZOLE-TRIMETHOPRIM 400-80 MG PO TABS
1.0000 | ORAL_TABLET | Freq: Every day | ORAL | Status: DC
  Administered 2017-09-24 – 2017-09-26 (×3): 1 via ORAL
  Filled 2017-09-23 (×3): qty 1

## 2017-09-23 MED ORDER — MENTHOL 3 MG MT LOZG: 1.0000 | LOZENGE | OROMUCOSAL | Status: DC | PRN

## 2017-09-23 MED ORDER — SODIUM CHLORIDE 0.9 % IV SOLN
INTRAVENOUS | Status: DC | PRN
  Administered 2017-09-23: 25 ug/min via INTRAVENOUS

## 2017-09-23 MED ORDER — ROPIVACAINE HCL 7.5 MG/ML IJ SOLN
INTRAMUSCULAR | Status: DC | PRN
  Administered 2017-09-23: 20 mL via PERINEURAL

## 2017-09-23 MED ORDER — TRANEXAMIC ACID 1000 MG/10ML IV SOLN
1000.0000 mg | INTRAVENOUS | Status: AC
  Administered 2017-09-23: 1000 mg via INTRAVENOUS
  Filled 2017-09-23: qty 10

## 2017-09-23 MED ORDER — MIDAZOLAM HCL 2 MG/2ML IJ SOLN
INTRAMUSCULAR | Status: AC
  Administered 2017-09-23: 1 mg
  Filled 2017-09-23: qty 2

## 2017-09-23 MED ORDER — ACETAMINOPHEN 10 MG/ML IV SOLN
1000.0000 mg | Freq: Four times a day (QID) | INTRAVENOUS | Status: DC
  Administered 2017-09-23: 1000 mg via INTRAVENOUS
  Filled 2017-09-23: qty 100

## 2017-09-23 MED ORDER — MIDAZOLAM HCL 2 MG/2ML IJ SOLN
1.0000 mg | INTRAMUSCULAR | Status: DC | PRN
  Administered 2017-09-23: 1 mg via INTRAVENOUS

## 2017-09-23 MED ORDER — CEFAZOLIN SODIUM-DEXTROSE 2-4 GM/100ML-% IV SOLN
2.0000 g | Freq: Four times a day (QID) | INTRAVENOUS | Status: AC
  Administered 2017-09-23 – 2017-09-24 (×2): 2 g via INTRAVENOUS
  Filled 2017-09-23 (×2): qty 100

## 2017-09-23 MED ORDER — ONDANSETRON HCL 4 MG/2ML IJ SOLN
INTRAMUSCULAR | Status: DC | PRN
  Administered 2017-09-23: 4 mg via INTRAVENOUS

## 2017-09-23 MED ORDER — MIDAZOLAM HCL 5 MG/5ML IJ SOLN
INTRAMUSCULAR | Status: DC | PRN
  Administered 2017-09-23 (×2): 1 mg via INTRAVENOUS

## 2017-09-23 MED ORDER — PROPOFOL 10 MG/ML IV BOLUS
INTRAVENOUS | Status: AC
  Filled 2017-09-23: qty 40

## 2017-09-23 MED ORDER — PROPOFOL 500 MG/50ML IV EMUL
INTRAVENOUS | Status: DC | PRN
  Administered 2017-09-23: 75 ug/kg/min via INTRAVENOUS

## 2017-09-23 MED ORDER — PROPOFOL 10 MG/ML IV BOLUS
INTRAVENOUS | Status: AC
  Filled 2017-09-23: qty 20

## 2017-09-23 MED ORDER — SODIUM CHLORIDE 0.9 % IJ SOLN
INTRAMUSCULAR | Status: AC
  Filled 2017-09-23: qty 50

## 2017-09-23 MED ORDER — FENTANYL CITRATE (PF) 100 MCG/2ML IJ SOLN
50.0000 ug | INTRAMUSCULAR | Status: DC | PRN
  Administered 2017-09-23: 50 ug via INTRAVENOUS

## 2017-09-23 MED ORDER — EMTRICITABINE-TENOFOVIR AF 200-25 MG PO TABS
1.0000 | ORAL_TABLET | Freq: Every day | ORAL | Status: DC
  Administered 2017-09-24 – 2017-09-26 (×3): 1 via ORAL
  Filled 2017-09-23 (×3): qty 1

## 2017-09-23 MED ORDER — HYDROCHLOROTHIAZIDE 25 MG PO TABS
25.0000 mg | ORAL_TABLET | Freq: Every day | ORAL | Status: DC
  Administered 2017-09-24 – 2017-09-26 (×3): 25 mg via ORAL
  Filled 2017-09-23 (×3): qty 1

## 2017-09-23 MED ORDER — PHENOL 1.4 % MT LIQD: 1.0000 | OROMUCOSAL | Status: DC | PRN

## 2017-09-23 MED ORDER — PROPOFOL 500 MG/50ML IV EMUL
INTRAVENOUS | Status: DC | PRN
  Administered 2017-09-23: 50 mg via INTRAVENOUS

## 2017-09-23 MED ORDER — METOCLOPRAMIDE HCL 5 MG/ML IJ SOLN: 5.0000 mg | Freq: Three times a day (TID) | INTRAMUSCULAR | Status: DC | PRN

## 2017-09-23 MED ORDER — FLEET ENEMA 7-19 GM/118ML RE ENEM: 1.0000 | ENEMA | Freq: Once | RECTAL | Status: DC | PRN

## 2017-09-23 MED ORDER — FAMOTIDINE 20 MG PO TABS
20.0000 mg | ORAL_TABLET | Freq: Every day | ORAL | Status: DC
  Administered 2017-09-23 – 2017-09-25 (×3): 20 mg via ORAL
  Filled 2017-09-23 (×3): qty 1

## 2017-09-23 MED ORDER — GABAPENTIN 300 MG PO CAPS
300.0000 mg | ORAL_CAPSULE | Freq: Once | ORAL | Status: AC
  Administered 2017-09-23: 300 mg via ORAL
  Filled 2017-09-23: qty 1

## 2017-09-23 MED ORDER — BUDESONIDE 0.25 MG/2ML IN SUSP
0.2500 mg | Freq: Two times a day (BID) | RESPIRATORY_TRACT | Status: DC
  Administered 2017-09-23 – 2017-09-26 (×6): 0.25 mg via RESPIRATORY_TRACT
  Filled 2017-09-23 (×6): qty 2

## 2017-09-23 MED ORDER — ASPIRIN EC 325 MG PO TBEC
325.0000 mg | DELAYED_RELEASE_TABLET | Freq: Two times a day (BID) | ORAL | Status: DC
  Administered 2017-09-24 – 2017-09-26 (×5): 325 mg via ORAL
  Filled 2017-09-23 (×5): qty 1

## 2017-09-23 MED ORDER — BUPIVACAINE IN DEXTROSE 0.75-8.25 % IT SOLN
INTRATHECAL | Status: DC | PRN
  Administered 2017-09-23: 2 mL via INTRATHECAL

## 2017-09-23 MED ORDER — ONDANSETRON HCL 4 MG/2ML IJ SOLN: 4.0000 mg | Freq: Four times a day (QID) | INTRAMUSCULAR | Status: DC | PRN

## 2017-09-23 MED ORDER — AMLODIPINE BESYLATE 10 MG PO TABS
10.0000 mg | ORAL_TABLET | Freq: Once | ORAL | Status: AC
  Administered 2017-09-23: 10 mg via ORAL
  Filled 2017-09-23: qty 1

## 2017-09-23 MED ORDER — SODIUM CHLORIDE 0.9 % IR SOLN
Status: DC | PRN
  Administered 2017-09-23: 1000 mL

## 2017-09-23 MED ORDER — DOCUSATE SODIUM 100 MG PO CAPS
100.0000 mg | ORAL_CAPSULE | Freq: Two times a day (BID) | ORAL | Status: DC
  Administered 2017-09-23 – 2017-09-26 (×6): 100 mg via ORAL
  Filled 2017-09-23 (×6): qty 1

## 2017-09-23 MED ORDER — TAMSULOSIN HCL 0.4 MG PO CAPS
0.8000 mg | ORAL_CAPSULE | Freq: Every day | ORAL | Status: DC
  Administered 2017-09-24 – 2017-09-26 (×3): 0.8 mg via ORAL
  Filled 2017-09-23 (×3): qty 2

## 2017-09-23 MED ORDER — SODIUM CHLORIDE 0.9 % IJ SOLN
INTRAMUSCULAR | Status: DC | PRN
  Administered 2017-09-23: 60 mL

## 2017-09-23 MED ORDER — METHOCARBAMOL 500 MG IVPB - SIMPLE MED
500.0000 mg | Freq: Four times a day (QID) | INTRAVENOUS | Status: DC | PRN
  Filled 2017-09-23: qty 50

## 2017-09-23 MED ORDER — OXYCODONE HCL 5 MG PO TABS
10.0000 mg | ORAL_TABLET | ORAL | Status: DC | PRN
  Administered 2017-09-23 – 2017-09-26 (×6): 10 mg via ORAL
  Filled 2017-09-23 (×3): qty 2

## 2017-09-23 MED ORDER — MIDAZOLAM HCL 2 MG/2ML IJ SOLN
INTRAMUSCULAR | Status: AC
  Filled 2017-09-23: qty 2

## 2017-09-23 MED ORDER — METOCLOPRAMIDE HCL 5 MG PO TABS: 5.0000 mg | ORAL_TABLET | Freq: Three times a day (TID) | ORAL | Status: DC | PRN

## 2017-09-23 MED ORDER — CEFAZOLIN SODIUM-DEXTROSE 2-4 GM/100ML-% IV SOLN
2.0000 g | INTRAVENOUS | Status: AC
  Administered 2017-09-23: 2 g via INTRAVENOUS
  Filled 2017-09-23: qty 100

## 2017-09-23 MED ORDER — TRAMADOL HCL 50 MG PO TABS: 50.0000 mg | ORAL_TABLET | Freq: Four times a day (QID) | ORAL | Status: DC | PRN

## 2017-09-23 MED ORDER — DIPHENHYDRAMINE HCL 12.5 MG/5ML PO ELIX: 12.5000 mg | ORAL_SOLUTION | ORAL | Status: DC | PRN

## 2017-09-23 SURGICAL SUPPLY — 76 items
ADAPTER BOLT FEMORAL +2/-2 (Knees) ×2 IMPLANT
ADPR FEM +2/-2 OFST BOLT (Knees) ×1 IMPLANT
ADPR FEM 5D STRL KN PFC SGM (Orthopedic Implant) ×1 IMPLANT
AUG FEM 4 12 STRL LF KN LT (Knees) ×1 IMPLANT
AUG FEM 4 4 CMB LF KN POST (Knees) ×1 IMPLANT
AUG FEM SZ4 4 STRL LF KN LT TI (Knees) ×1 IMPLANT
AUG FEM SZ4 8 CMB POST STRL LF (Orthopedic Implant) ×1 IMPLANT
AUG FEMORAL DISTAL LT SZ4 12M (Knees) ×3 IMPLANT
AUG TIB SZ3 5 REV STP WDG STRL (Knees) ×2 IMPLANT
AUGMENT FEM SZ4 4 LT DIST PFC (Knees) ×2 IMPLANT
AUGMENT FEMORAL DSTL LTSZ4 12M (Knees) ×1 IMPLANT
AUGMENT FMRL PST PFC SIG SZ4 8 (Orthopedic Implant) ×1 IMPLANT
AUGMENT POSTERIOR PFC SZ4 4MM (Knees) IMPLANT
BAG SPEC THK2 15X12 ZIP CLS (MISCELLANEOUS) ×1
BAG ZIPLOCK 12X15 (MISCELLANEOUS) ×3 IMPLANT
BANDAGE ACE 6X5 VEL STRL LF (GAUZE/BANDAGES/DRESSINGS) ×3 IMPLANT
BLADE SAG 18X100X1.27 (BLADE) ×3 IMPLANT
BLADE SAW SGTL 11.0X1.19X90.0M (BLADE) ×3 IMPLANT
BONE CEMENT GENTAMICIN (Cement) ×9 IMPLANT
CEMENT BONE GENTAMICIN 40 (Cement) ×3 IMPLANT
COMP FEM CEM LT SZ4 (Orthopedic Implant) ×3 IMPLANT
COMPONENT FEM CEM LT SZ4 (Orthopedic Implant) ×1 IMPLANT
COVER SURGICAL LIGHT HANDLE (MISCELLANEOUS) ×3 IMPLANT
CUFF TOURN SGL QUICK 34 (TOURNIQUET CUFF) ×3
CUFF TRNQT CYL 34X4X40X1 (TOURNIQUET CUFF) ×1 IMPLANT
DRAPE U-SHAPE 47X51 STRL (DRAPES) ×3 IMPLANT
DRSG ADAPTIC 3X8 NADH LF (GAUZE/BANDAGES/DRESSINGS) ×3 IMPLANT
DRSG PAD ABDOMINAL 8X10 ST (GAUZE/BANDAGES/DRESSINGS) ×3 IMPLANT
DURAPREP 26ML APPLICATOR (WOUND CARE) ×3 IMPLANT
ELECT REM PT RETURN 15FT ADLT (MISCELLANEOUS) ×3 IMPLANT
EVACUATOR 1/8 PVC DRAIN (DRAIN) ×3 IMPLANT
FEM POST AUG PFC SIGMA SZ4 8MM (Orthopedic Implant) ×3 IMPLANT
FEMORAL ADAPTER (Orthopedic Implant) ×3 IMPLANT
GAUZE SPONGE 4X4 12PLY STRL (GAUZE/BANDAGES/DRESSINGS) ×3 IMPLANT
GLOVE BIO SURGEON STRL SZ7.5 (GLOVE) ×3 IMPLANT
GLOVE BIO SURGEON STRL SZ8 (GLOVE) ×3 IMPLANT
GLOVE BIOGEL PI IND STRL 7.0 (GLOVE) ×1 IMPLANT
GLOVE BIOGEL PI IND STRL 8 (GLOVE) ×3 IMPLANT
GLOVE BIOGEL PI INDICATOR 7.0 (GLOVE) ×2
GLOVE BIOGEL PI INDICATOR 8 (GLOVE) ×6
GLOVE SURG SS PI 7.0 STRL IVOR (GLOVE) ×3 IMPLANT
GOWN STRL REUS W/TWL LRG LVL3 (GOWN DISPOSABLE) ×6 IMPLANT
GOWN STRL REUS W/TWL XL LVL3 (GOWN DISPOSABLE) ×3 IMPLANT
HANDPIECE INTERPULSE COAX TIP (DISPOSABLE) ×3
IMMOBILIZER KNEE 20 (SOFTGOODS) ×3
IMMOBILIZER KNEE 20 THIGH 36 (SOFTGOODS) ×1 IMPLANT
INSERT TC3 TIBIAL SZ4 20.0 (Knees) ×2 IMPLANT
MANIFOLD NEPTUNE II (INSTRUMENTS) ×3 IMPLANT
NS IRRIG 1000ML POUR BTL (IV SOLUTION) ×3 IMPLANT
PAD ABD 8X10 STRL (GAUZE/BANDAGES/DRESSINGS) ×2 IMPLANT
PADDING CAST COTTON 6X4 STRL (CAST SUPPLIES) ×6 IMPLANT
PATELLA DOME PFC 38MM (Knees) ×3 IMPLANT
PENCIL HANDSWITCHING (ELECTRODE) ×2 IMPLANT
POSITIONER SURGICAL ARM (MISCELLANEOUS) ×3 IMPLANT
POSTERIOR AUGMENT PFC SZ4 4MM (Knees) ×3 IMPLANT
RESTRICTOR CEMENT SZ 5 C-STEM (Cement) ×3 IMPLANT
SET HNDPC FAN SPRY TIP SCT (DISPOSABLE) ×1 IMPLANT
STAPLER VISISTAT 35W (STAPLE) ×2 IMPLANT
STEM FLUTED UNIV REV 75X20 (Stem) ×3 IMPLANT
STEM TIBIA PFC 13X30MM (Stem) ×2 IMPLANT
SUT PDS AB 1 CT1 27 (SUTURE) ×12 IMPLANT
SUT STRATAFIX 0 PDS 27 VIOLET (SUTURE) ×6
SUT VIC AB 2-0 CT1 27 (SUTURE) ×18
SUT VIC AB 2-0 CT1 TAPERPNT 27 (SUTURE) ×6 IMPLANT
SUTURE STRATFX 0 PDS 27 VIOLET (SUTURE) ×2 IMPLANT
SWAB COLLECTION DEVICE MRSA (MISCELLANEOUS) ×3 IMPLANT
SWAB CULTURE ESWAB REG 1ML (MISCELLANEOUS) ×3 IMPLANT
SYR 50ML LL SCALE MARK (SYRINGE) IMPLANT
TOWER CARTRIDGE SMART MIX (DISPOSABLE) ×3 IMPLANT
TRAY FOLEY MTR SLVR 16FR STAT (SET/KITS/TRAYS/PACK) ×3 IMPLANT
TRAY REVISION SZ 4 (Knees) ×3 IMPLANT
TRAY SLEEVE CEM ML (Knees) ×2 IMPLANT
WATER STERILE IRR 1000ML POUR (IV SOLUTION) ×3 IMPLANT
WEDGE SIZE 3 5MM (Knees) ×4 IMPLANT
WRAP KNEE MAXI GEL POST OP (GAUZE/BANDAGES/DRESSINGS) ×3 IMPLANT
YANKAUER SUCT BULB TIP 10FT TU (MISCELLANEOUS) IMPLANT

## 2017-09-23 NOTE — Anesthesia Procedure Notes (Signed)
Anesthesia Regional Block: Adductor canal block   Pre-Anesthetic Checklist: ,, timeout performed, Correct Patient, Correct Site, Correct Laterality, Correct Procedure, Correct Position, site marked, Risks and benefits discussed,  Surgical consent,  Pre-op evaluation,  At surgeon's request and post-op pain management  Laterality: Left  Prep: Maximum Sterile Barrier Precautions used, chloraprep       Needles:  Injection technique: Single-shot  Needle Type: Echogenic Stimulator Needle     Needle Length: 9cm  Needle Gauge: 22     Additional Needles:   Procedures:,,,, ultrasound used (permanent image in chart),,,,  Narrative:  Start time: 09/23/2017 12:20 PM End time: 09/23/2017 12:30 PM Injection made incrementally with aspirations every 5 mL.  Performed by: Personally  Anesthesiologist: Freddrick March, MD  Additional Notes: Monitors applied. No increased pain on injection. No increased resistance to injection. Injection made in 5cc increments. Good needle visualization. Patient tolerated procedure well.

## 2017-09-23 NOTE — Op Note (Signed)
NAME: Logan Levine, Logan Levine MEDICAL RECORD IY:[id number] ACCOUNT [id number] DATE OF BIRTH:August 05, 1950 FACILITY: [facility] LOCATION: [id number] PHYSICIAN:Marla Pouliot Zella Ball, MD  OPERATIVE REPORT  DATE OF PROCEDURE:  09/23/2017  PREOPERATIVE DIAGNOSIS:  Resection arthroplasty, left knee, secondary to periprosthetic joint infection.  POSTOPERATIVE DIAGNOSIS:  Resection arthroplasty, left knee, secondary to periprosthetic joint infection.  PROCEDURE:  Left total knee arthroplasty reimplantation.  SURGEON:  Gaynelle Arabian, MD  ASSISTANT:  Ladene Artist, PA-C  ANESTHESIA:  Adductor canal block and spinal.  ESTIMATED BLOOD LOSS:  Minimal.  DRAINS:  Hemovac x1.  TOURNIQUET TIME:  80 minutes at 300 mmHg.  COMPLICATIONS:  None.  CONDITION:  Stable to recovery.  BRIEF CLINICAL NOTE:  The patient is a 67 year old male who presented approximately two and a half months ago with a periprosthetic joint infection.  He was treated with a resection arthroplasty and antibiotic spacer.  He has also had intravenous  antibiotics.  Clinically, he has essentially cleared the infection confirmed by lab work.  He presents now for total knee arthroplasty reimplantation.  PROCEDURE IN DETAIL:  After successful administration of adductor canal block and spinal anesthetic, a tourniquet was placed high on the left thigh and left lower extremity prepped and draped in the usual sterile fashion.  Extremity was wrapped in  Esmarch and tourniquet inflated to 300 mmHg.  Midline incision was made with a #10 blade through subcutaneous tissue to the level of the extensor mechanism.  A fresh blade was used to make a medial parapatellar arthrotomy.  We did not encounter any fluid  in the joint.  There was no evidence of any abnormal appearing synovium.  He did have a significant amount of scar tissue and I excised the scar from the medial and lateral gutters and suprapatellar region as well as infrapatellar region.  I was then  able  to sublux the patella but not evert it.  I decided to perform a quadriceps snip in order to mobilize the patella enough to remove the spacer and gain good access to the knee for placement of the revision prosthesis.  The quadriceps snip is  performed.  Patella is everted.  I was able to remove the articulating spacer.  Both the femoral and tibial components were easily removed.  The cement was removed from the tibial canal.  Once cement was removed, circumferential retractors were placed  around the tibia and the extramedullary tibial guide was placed.  There is a line proximally at the medial aspect of tibial tubercle distally along the second metatarsal axis tibial crest.  Block was pinned to remove 2 mm of the previous cut.  I then  thoroughly irrigated the canal and reamed up to 13 mm for a 13 mm stem.  Size 4 MBT is the most appropriate tibial component.  The proximal tibia was then prepared with the modular drill and a modular drill +13 by 30 stem extension for the 13 mm canal.   We then broached with a 29 sleeve for the nail and antirotational sleeve.  The femur was then addressed.  Femoral canal was opened with the drill and then thoroughly irrigated.  I reamed the femoral canal up to 18 mm, which had an excellent press fit.  An 18 mm reamer was utilized as our intramedullary cutting guide.  The  distal femoral cutting block is placed.  On the medial side I had to go 12 mm to get any bone and the lateral side 4 mm to get the joint line back to its  normal position.  Size 4 was the most appropriate femoral component and a size 4 cutting block is  placed with the rotation marked off the epicondylar axis and confirmed with a rectangular flexion gap with a spacer block in place.  Block is pinned in this position.  Anteriorly, we did not remove any bone and chamfers not get any bone posteriorly.  I  had to go 4 mm on the lateral side and 8 mm on the medial side to get bony contact.  We then placed the  intercondylar block into the TC3 cut.  The trials were then placed on the tibial side.  It is a size 4 MBT revision tray with 5 mm augments medial and  lateral, 29 sleeve and 13 x 30 stem extension.  While on the femoral size, a size 4 TC3 implant with an 18 x 75 stem in a +2 position in 5 degrees of valgus.  The augments were 12 mm distal medial, 4 mm distal lateral, 8 mm posterior medial, 4 mm  posterior lateral.  With the trials in place, a 20 mm insert was utilized and full extension was achieved with excellent varus varus/valgus and anterior/posterior balance throughout full range of motion.  Patella was again everted.  All soft tissue  removed off the patella.  I used the oscillating saw to resect the patella down to a 12 mm thickness.  A 38 template was placed.  Lug holes were drilled.  The trial patella was placed and it tracks normally.  The trials were removed and a cement restrictor size 5 was placed at the appropriate depth in the tibial canal.  The components were assembled on the back table.  Once assembled and the bone surfaces were thoroughly irrigated with saline with pulsatile  lavage, 3 batches of gentamicin impregnated cement are mixed and once ready for implantation the tibial component was cemented first and the canal and on the cut bone surface.  Once again size 4 MBT revision tray with 5 mm medial and lateral augments.   The augments are size 3 so they will not overhang.  A 29 sleeve was placed with a 13 x 30 stem extension.  That is impacted and all extruded cement removed.  On the femoral side, we cemented distally with the press fit stem in the canal.  Same sizes as  the trials were utilized.  It is impacted and all extruded cement removed.  A 20 mm insert was placed with knee held in full extension.  All extruded cement removed.  The 38 patella was cemented into place and held with a clamp.  Extruded cement was  removed from there.  Once the cement was fully hardened, the  permanent 20 mm TC3 rotating platform insert was placed into the tibial tray.  The knee was reduced with outstanding stability.  Full extension was achieved with excellent varus varus/valgus  and anterior/posterior balance throughout full range of motion.  Wound was copiously irrigated with saline solution and then 20 mL of Exparel mixed with 60 mL of saline were injected into the extensor mechanism, medial and lateral gutters periosteum of  the femur and subcutaneous tissues.  The quadriceps snip was then closed with interrupted #1 PDS.  Hemovac drain was placed deep and then the arthrotomy was closed over the drain with a running 0 Stratafix suture.  Flexion against gravity was about 90  degrees.  Tourniquet was then released with a total time of 80 minutes.  Minor bleeding was stopped  with cautery.  Subcutaneous was closed with interrupted 2-0 Vicryl and skin closed with staples.  The incision was cleaned and dried and a bulky sterile  dressing applied.  He was then placed into a knee immobilizer, awakened and transported to recovery in stable condition.  Note that a surgical assistant was a medical necessity for this procedure.  Surgical assistance necessary for retraction of vital ligaments and neurovascular structures and for proper positioning of the limb for removal of the old implant in proper  positioning of the limb for accurate placement and alignment of the new implant.  TN/NUANCE  D:09/23/2017 T:09/23/2017 JOB:[id number]

## 2017-09-23 NOTE — Anesthesia Preprocedure Evaluation (Addendum)
Anesthesia Evaluation  Patient identified by MRN, date of birth, ID band  Reviewed: Allergy & Precautions, NPO status , Patient's Chart, lab work & pertinent test results  Airway Mallampati: I  TM Distance: >3 FB Neck ROM: Full    Dental no notable dental hx. (+) Edentulous Upper, Edentulous Lower, Dental Advisory Given   Pulmonary COPD,  COPD inhaler, Current Smoker,    Pulmonary exam normal breath sounds clear to auscultation       Cardiovascular hypertension, Normal cardiovascular exam Rhythm:Regular Rate:Normal     Neuro/Psych negative neurological ROS  negative psych ROS   GI/Hepatic GERD  Medicated,(+) Hepatitis -, C  Endo/Other  negative endocrine ROS  Renal/GU negative Renal ROS  negative genitourinary   Musculoskeletal  (+) Arthritis , Osteoarthritis,    Abdominal   Peds  Hematology  (+) anemia ,   Anesthesia Other Findings HIV  Reproductive/Obstetrics                            Anesthesia Physical Anesthesia Plan  ASA: III  Anesthesia Plan: Regional and Spinal   Post-op Pain Management:  Regional for Post-op pain   Induction: Intravenous  PONV Risk Score and Plan: 0 and Dexamethasone and Ondansetron  Airway Management Planned: Natural Airway and Simple Face Mask  Additional Equipment:   Intra-op Plan:   Post-operative Plan:   Informed Consent: I have reviewed the patients History and Physical, chart, labs and discussed the procedure including the risks, benefits and alternatives for the proposed anesthesia with the patient or authorized representative who has indicated his/her understanding and acceptance.   Dental advisory given  Plan Discussed with: CRNA  Anesthesia Plan Comments:         Anesthesia Quick Evaluation

## 2017-09-23 NOTE — Anesthesia Postprocedure Evaluation (Signed)
Anesthesia Post Note  Patient: Logan Levine  Procedure(s) Performed: LEFT TOTAL KNEE ARTHROPLASTY REIMPLANTATION (Left Knee)     Patient location during evaluation: PACU Anesthesia Type: Spinal Level of consciousness: oriented and awake and alert Pain management: pain level controlled Vital Signs Assessment: post-procedure vital signs reviewed and stable Respiratory status: spontaneous breathing, respiratory function stable and patient connected to nasal cannula oxygen Cardiovascular status: blood pressure returned to baseline and stable Postop Assessment: no headache, no backache and no apparent nausea or vomiting Anesthetic complications: no    Last Vitals:  Vitals:   09/23/17 1615 09/23/17 1630  BP: (!) 120/92 128/89  Pulse: 70 82  Resp: 15 16  Temp:  36.4 C  SpO2: 99% 94%    Last Pain:  Vitals:   09/23/17 1630  TempSrc: Oral  PainSc: 0-No pain                 Vylette Strubel L Tyshay Adee

## 2017-09-23 NOTE — Interval H&P Note (Signed)
History and Physical Interval Note:  09/23/2017 11:09 AM  Logan Levine  has presented today for surgery, with the diagnosis of resection arthroplasty left knee; infection  The various methods of treatment have been discussed with the patient and family. After consideration of risks, benefits and other options for treatment, the patient has consented to  Procedure(s): LEFT TOTAL KNEE ARTHROPLASTY REIMPLANTATION (Left) as a surgical intervention .  The patient's history has been reviewed, patient examined, no change in status, stable for surgery.  I have reviewed the patient's chart and labs.  Questions were answered to the patient's satisfaction.     Pilar Plate Hayato Guaman

## 2017-09-23 NOTE — Brief Op Note (Signed)
09/23/2017  3:16 PM  PATIENT:  Logan Levine  67 y.o. male  PRE-OPERATIVE DIAGNOSIS:  resection arthroplasty left knee; infection  POST-OPERATIVE DIAGNOSIS:  resection arthroplasty left knee; infection  PROCEDURE:  Procedure(s): LEFT TOTAL KNEE ARTHROPLASTY REIMPLANTATION (Left)  SURGEON:  Surgeon(s) and Role:    Gaynelle Arabian, MD - Primary  PHYSICIAN ASSISTANT:   ASSISTANTS: Theresa Duty, PA-C   ANESTHESIA:   Aductor canal block and spinal  EBL: 25 ml   BLOOD ADMINISTERED:none  DRAINS: (Medium) Hemovact drain(s) in the left knee with  Suction Open   LOCAL MEDICATIONS USED:  OTHER Exparel  COUNTS:  YES  TOURNIQUET:  80 minutes @ 300 mm Hg  DICTATION: .Other Dictation: Dictation Number [id number]  PLAN OF CARE: Admit to inpatient   PATIENT DISPOSITION:  PACU - hemodynamically stable.

## 2017-09-23 NOTE — Anesthesia Procedure Notes (Signed)
Spinal  Patient location during procedure: OR Start time: 09/23/2017 1:08 PM End time: 09/23/2017 1:11 PM Staffing Anesthesiologist: Freddrick March, MD Resident/CRNA: Glory Buff, CRNA Performed: resident/CRNA  Preanesthetic Checklist Completed: patient identified, site marked, surgical consent, pre-op evaluation, timeout performed, IV checked, risks and benefits discussed and monitors and equipment checked Spinal Block Patient position: sitting Prep: DuraPrep Patient monitoring: heart rate, continuous pulse ox and blood pressure Approach: midline Location: L3-4 Injection technique: single-shot Needle Needle type: Pencan  Needle gauge: 24 G Needle length: 9 cm Needle insertion depth: 7 cm Assessment Sensory level: T6 Additional Notes Kit date checked and verified.  Sitting position, 1% xylo for skin local, stick x 1, - heme, -paraesthesia, +CSF pre and post injection, patient tolerated well.

## 2017-09-23 NOTE — Transfer of Care (Signed)
Immediate Anesthesia Transfer of Care Note  Patient: Logan Levine  Procedure(s) Performed: LEFT TOTAL KNEE ARTHROPLASTY REIMPLANTATION (Left Knee)  Patient Location: PACU  Anesthesia Type:MAC and Spinal  Level of Consciousness: awake, alert  and oriented  Airway & Oxygen Therapy: Patient Spontanous Breathing and Patient connected to face mask oxygen  Post-op Assessment: Report given to RN and Post -op Vital signs reviewed and stable  Post vital signs: Reviewed and stable  Last Vitals:  Vitals Value Taken Time  BP    Temp    Pulse    Resp    SpO2      Last Pain:  Vitals:   09/23/17 1156  TempSrc:   PainSc: 0-No pain      Patients Stated Pain Goal: 4 (48/83/01 4159)  Complications: No apparent anesthesia complications

## 2017-09-23 NOTE — Anesthesia Procedure Notes (Signed)
Date/Time: 09/23/2017 1:01 PM Performed by: Glory Buff, CRNA Oxygen Delivery Method: Simple face mask

## 2017-09-24 ENCOUNTER — Encounter (HOSPITAL_COMMUNITY): Payer: Self-pay | Admitting: Orthopedic Surgery

## 2017-09-24 LAB — CBC
HCT: 31.1 % — ABNORMAL LOW (ref 39.0–52.0)
HEMOGLOBIN: 10.4 g/dL — AB (ref 13.0–17.0)
MCH: 29.4 pg (ref 26.0–34.0)
MCHC: 33.4 g/dL (ref 30.0–36.0)
MCV: 87.9 fL (ref 78.0–100.0)
Platelets: 162 10*3/uL (ref 150–400)
RBC: 3.54 MIL/uL — AB (ref 4.22–5.81)
RDW: 17.2 % — ABNORMAL HIGH (ref 11.5–15.5)
WBC: 8.4 10*3/uL (ref 4.0–10.5)

## 2017-09-24 LAB — BASIC METABOLIC PANEL
Anion gap: 8 (ref 5–15)
BUN: 20 mg/dL (ref 8–23)
CHLORIDE: 104 mmol/L (ref 98–111)
CO2: 23 mmol/L (ref 22–32)
CREATININE: 0.91 mg/dL (ref 0.61–1.24)
Calcium: 9.3 mg/dL (ref 8.9–10.3)
GFR calc non Af Amer: >60 mL/min (ref >60)
Glucose, Bld: 139 mg/dL — ABNORMAL HIGH (ref 70–99)
POTASSIUM: 5 mmol/L (ref 3.5–5.1)
SODIUM: 135 mmol/L (ref 135–145)

## 2017-09-24 MED ORDER — TEMAZEPAM 15 MG PO CAPS
15.0000 mg | ORAL_CAPSULE | Freq: Every evening | ORAL | Status: DC | PRN
  Administered 2017-09-24: 15 mg via ORAL
  Filled 2017-09-24: qty 1

## 2017-09-24 NOTE — NC FL2 (Signed)
[place] LEVEL OF CARE SCREENING TOOL     IDENTIFICATION  Patient Name: Logan Levine Birthdate: August 16, 1950 Sex: male Admission Date (Current Location): 09/23/2017  [facility] and [place] Number:  Herbalist and Address:  [facility],  [place] [address] [place]      Provider Number: [id number]  Attending Physician Name and Address:  Gaynelle Arabian, MD  Relative Name and Phone Number:       Current Level of Care: Hospital Recommended Level of Care: [place] Prior Approval Number:    Date Approved/Denied:   PASRR Number: [id number]  Discharge Plan: SNF    Current Diagnoses: Patient Active Problem List   Diagnosis Date Noted  . Septic arthritis of knee, left ([place]) 05/04/2017  . Hepatic cirrhosis due to chronic hepatitis C infection ([place]) 10/29/2016  . Constipation 10/29/2016  . TOTAL KNEE REPLACEMENT, LEFT, HX OF 11/12/2009  . KNEE PAIN, ACUTE 11/01/2008  . DEMENTIA, MILD 11/15/2007  . PYELONEPHRITIS, ACUTE 05/28/2007  . HERPES, GENITAL NEC 11/19/2006  . GOUT NOS 11/19/2006  . SMOKER 11/19/2006  . HYPERTENSION, BENIGN ESSENTIAL 11/19/2006  . BRONCHITIS NOS 11/19/2006  . BRONCHITIS, CHRONIC NEC 11/19/2006  . HERNIA, UMBILICAL 94/70/9628  . HIV DISEASE 07/03/2006  . HEPATITIS C 07/03/2006  . SYPHILIS, EARLY, SYMPTOMATIC, PRIMARY NEC 07/03/2006  . OSTEOARTHRITIS 07/03/2006    Orientation RESPIRATION BLADDER Height & Weight     Self, Time, Situation, Place  Normal Continent Weight: 171 lb 1.2 oz (77.6 kg) Height:  5\' 8"  (172.7 cm)  BEHAVIORAL SYMPTOMS/MOOD NEUROLOGICAL BOWEL NUTRITION STATUS      Continent Diet(Regular )  AMBULATORY STATUS COMMUNICATION OF NEEDS Skin   Extensive Assist Verbally Normal                       Personal Care Assistance Level of Assistance  Bathing, Feeding, Dressing Bathing Assistance: Limited assistance Feeding assistance: Independent Dressing Assistance:  Limited assistance     Functional Limitations Info  Sight, Hearing, Speech Sight Info: Adequate Hearing Info: Adequate Speech Info: Adequate    SPECIAL CARE FACTORS FREQUENCY  PT (By licensed PT), OT (By licensed OT)     PT Frequency: 7x/week  OT Frequency: 7x/week             Contractures Contractures Info: Not present    Additional Factors Info  Code Status, Allergies Code Status Info: Full Code Allergies Info: Allergies: No Known Allergies           Current Medications (09/24/2017):  This is the current hospital active medication list Current Facility-Administered Medications  Medication Dose Route Frequency Provider Last Rate Last Dose  . 0.9 %  sodium chloride infusion   Intravenous Continuous Gaynelle Arabian, MD 75 mL/hr at 09/23/17 1700 75 mL/hr at 09/23/17 1700  . acetaminophen (TYLENOL) tablet 1,000 mg  1,000 mg Oral Q6H Aluisio, Pilar Plate, MD   1,000 mg at 09/23/17 1811  . albuterol (PROVENTIL) (2.5 MG/3ML) 0.083% nebulizer solution 3 mL  3 mL Inhalation Q6H PRN Gaynelle Arabian, MD      . aspirin EC tablet 325 mg  325 mg Oral BID Gaynelle Arabian, MD   325 mg at 09/24/17 0855  . bisacodyl (DULCOLAX) suppository 10 mg  10 mg Rectal Daily PRN Gaynelle Arabian, MD      . budesonide (PULMICORT) nebulizer solution 0.25 mg  0.25 mg Nebulization BID Gaynelle Arabian, MD   0.25 mg at 09/23/17 2147  . cyclobenzaprine (FLEXERIL) tablet  10 mg  10 mg Oral QHS Gaynelle Arabian, MD   10 mg at 09/23/17 2247  . diphenhydrAMINE (BENADRYL) 12.5 MG/5ML elixir 12.5-25 mg  12.5-25 mg Oral Q4H PRN Aluisio, Pilar Plate, MD      . docusate sodium (COLACE) capsule 100 mg  100 mg Oral BID Gaynelle Arabian, MD   100 mg at 09/24/17 0848  . emtricitabine-tenofovir AF (DESCOVY) 200-25 MG per tablet 1 tablet  1 tablet Oral Daily Gaynelle Arabian, MD   1 tablet at 09/24/17 0845  . famotidine (PEPCID) tablet 20 mg  20 mg Oral QHS Gaynelle Arabian, MD   20 mg at 09/23/17 2247  . gabapentin (NEURONTIN) capsule 300 mg  300  mg Oral TID Gaynelle Arabian, MD   300 mg at 09/24/17 0846  . hydrochlorothiazide (HYDRODIURIL) tablet 25 mg  25 mg Oral Daily Gaynelle Arabian, MD   25 mg at 09/24/17 0846  . HYDROmorphone (DILAUDID) injection 0.5-1 mg  0.5-1 mg Intravenous Q4H PRN Gaynelle Arabian, MD   1 mg at 09/24/17 0445  . menthol-cetylpyridinium (CEPACOL) lozenge 3 mg  1 lozenge Oral PRN Gaynelle Arabian, MD       Or  . phenol (CHLORASEPTIC) mouth spray 1 spray  1 spray Mouth/Throat PRN Aluisio, Pilar Plate, MD      . methocarbamol (ROBAXIN) tablet 500 mg  500 mg Oral Q6H PRN Gaynelle Arabian, MD   500 mg at 09/23/17 1659   Or  . methocarbamol (ROBAXIN) 500 mg in dextrose 5 % 50 mL IVPB  500 mg Intravenous Q6H PRN Aluisio, Pilar Plate, MD      . metoCLOPramide (REGLAN) tablet 5-10 mg  5-10 mg Oral Q8H PRN Gaynelle Arabian, MD       Or  . metoCLOPramide (REGLAN) injection 5-10 mg  5-10 mg Intravenous Q8H PRN Aluisio, Pilar Plate, MD      . ondansetron (ZOFRAN) tablet 4 mg  4 mg Oral Q6H PRN Gaynelle Arabian, MD       Or  . ondansetron (ZOFRAN) injection 4 mg  4 mg Intravenous Q6H PRN Aluisio, Pilar Plate, MD      . oxyCODONE (Oxy IR/ROXICODONE) immediate release tablet 10-15 mg  10-15 mg Oral Q4H PRN Gaynelle Arabian, MD   10 mg at 09/23/17 2247  . oxyCODONE (Oxy IR/ROXICODONE) immediate release tablet 5-10 mg  5-10 mg Oral Q4H PRN Gaynelle Arabian, MD   10 mg at 09/24/17 0845  . polyethylene glycol (MIRALAX / GLYCOLAX) packet 17 g  17 g Oral Daily PRN Aluisio, Pilar Plate, MD      . raltegravir (ISENTRESS) tablet 400 mg  400 mg Oral BID Gaynelle Arabian, MD   400 mg at 09/24/17 0846  . sodium phosphate (FLEET) 7-19 GM/118ML enema 1 enema  1 enema Rectal Once PRN Aluisio, Pilar Plate, MD      . sulfamethoxazole-trimethoprim (BACTRIM,SEPTRA) 400-80 MG per tablet 1 tablet  1 tablet Oral Daily Gaynelle Arabian, MD   1 tablet at 09/24/17 0848  . tamsulosin (FLOMAX) capsule 0.8 mg  0.8 mg Oral Daily Gaynelle Arabian, MD   0.8 mg at 09/24/17 0847  . temazepam (RESTORIL) capsule 15 mg   15 mg Oral QHS PRN Aluisio, Pilar Plate, MD      . traMADol Veatrice Bourbon) tablet 50-100 mg  50-100 mg Oral Q6H PRN Aluisio, Pilar Plate, MD      . valACYclovir Estell Harpin) tablet 500 mg  500 mg Oral Daily Gaynelle Arabian, MD   500 mg at 09/24/17 5732     Discharge Medications: Please see discharge summary for a  list of discharge medications.  Relevant Imaging Results:  Relevant Lab Results:   Additional Information GML:[id number]  Lia Hopping, LCSW

## 2017-09-24 NOTE — Evaluation (Signed)
Physical Therapy Evaluation Patient Details Name: Logan Levine MRN: [id number] DOB: [birth year] Today's Date: 09/24/2017   History of Present Illness  Pt s/p reimplantation of L TKR.  PT wtih hx of COPD and HIV  Clinical Impression  Pt admitted as above and presenting with functional mobility limitations 2* decreased L LE strength/ROM and post op pain.  Pt would benefit from follow up rehab at SNF level to maximize IND and safety prior to dc home with very ltd assist.    Follow Up Recommendations SNF    Equipment Recommendations  None recommended by PT    Recommendations for Other Services       Precautions / Restrictions Precautions Precautions: Fall Required Braces or Orthoses: Knee Immobilizer - Left Knee Immobilizer - Left: Discontinue once straight leg raise with < 10 degree lag Restrictions Weight Bearing Restrictions: No Other Position/Activity Restrictions: WBAT      Mobility  Bed Mobility Overal bed mobility: Needs Assistance Bed Mobility: Supine to Sit     Supine to sit: Min assist     General bed mobility comments: cues for sequence and use of R LE to self assist  Transfers Overall transfer level: Needs assistance Equipment used: Rolling walker (2 wheeled) Transfers: Sit to/from Stand Sit to Stand: Min assist;From elevated surface         General transfer comment: cues for LE management and use of UEs to self assist  Ambulation/Gait Ambulation/Gait assistance: Min assist Gait Distance (Feet): 75 Feet Assistive device: Rolling walker (2 wheeled) Gait Pattern/deviations: Step-to pattern;Decreased step length - right;Decreased step length - left;Shuffle;Trunk flexed Gait velocity: decr   General Gait Details: cues for sequence, posture, position from RW and IR on L  Stairs            Wheelchair Mobility    Modified Rankin (Stroke Patients Only)       Balance Overall balance assessment: Needs assistance Sitting-balance support: No  upper extremity supported;Feet supported Sitting balance-Leahy Scale: Good     Standing balance support: Bilateral upper extremity supported Standing balance-Leahy Scale: Poor                               Pertinent Vitals/Pain Pain Assessment: 0-10 Pain Score: 5  Pain Location: L knee Pain Descriptors / Indicators: Aching;Sore Pain Intervention(s): Limited activity within patient's tolerance;Monitored during session;Premedicated before session;Ice applied    Home Living Family/patient expects to be discharged to:: Skilled nursing facility Living Arrangements: Alone                    Prior Function Level of Independence: Independent with assistive device(s)               Hand Dominance        Extremity/Trunk Assessment   Upper Extremity Assessment Upper Extremity Assessment: Overall WFL for tasks assessed    Lower Extremity Assessment Lower Extremity Assessment: LLE deficits/detail LLE Deficits / Details: 2/5 quads with AAROM at knee -15 - 40       Communication      Cognition Arousal/Alertness: Awake/alert Behavior During Therapy: WFL for tasks assessed/performed Overall Cognitive Status: Within Functional Limits for tasks assessed                                        General Comments      Exercises  Total Joint Exercises Ankle Circles/Pumps: AROM;Both;15 reps;Supine Quad Sets: AROM;Both;10 reps;Supine Heel Slides: AAROM;Left;15 reps;Supine Straight Leg Raises: AAROM;Left;10 reps;Supine   Assessment/Plan    PT Assessment Patient needs continued PT services  PT Problem List Decreased strength;Decreased range of motion;Decreased activity tolerance;Decreased balance;Decreased mobility;Decreased knowledge of use of DME;Pain       PT Treatment Interventions DME instruction;Gait training;Stair training;Therapeutic activities;Functional mobility training;Therapeutic exercise;Balance training;Patient/family education     PT Goals (Current goals can be found in the Care Plan section)  Acute Rehab PT Goals Patient Stated Goal: Regain IND PT Goal Formulation: With patient Time For Goal Achievement: 10/01/17 Potential to Achieve Goals: Good    Frequency 7X/week   Barriers to discharge        Co-evaluation               AM-PAC PT "6 Clicks" Daily Activity  Outcome Measure Difficulty turning over in bed (including adjusting bedclothes, sheets and blankets)?: Unable Difficulty moving from lying on back to sitting on the side of the bed? : Unable Difficulty sitting down on and standing up from a chair with arms (e.g., wheelchair, bedside commode, etc,.)?: Unable Help needed moving to and from a bed to chair (including a wheelchair)?: A Little Help needed walking in hospital room?: A Little Help needed climbing 3-5 steps with a railing? : A Lot 6 Click Score: 11    End of Session Equipment Utilized During Treatment: Gait belt;Left knee immobilizer Activity Tolerance: Patient tolerated treatment well Patient left: in chair;with call bell/phone within reach;with chair alarm set Nurse Communication: Mobility status PT Visit Diagnosis: Difficulty in walking, not elsewhere classified (R26.2)    Time: 5498-2641 PT Time Calculation (min) (ACUTE ONLY): 38 min   Charges:   PT Evaluation $PT Eval Low Complexity: 1 Low PT Treatments $Gait Training: 8-22 mins $Therapeutic Exercise: 8-22 mins        Pg [phone]   Dupree Givler 09/24/2017, 12:52 PM

## 2017-09-24 NOTE — Clinical Social Work Note (Signed)
Clinical Social Work Assessment  Patient Details  Name: Logan Levine MRN: [id number] Date of Birth: 04-19-1950  Date of referral:  09/24/17               Reason for consult:  Discharge Planning                Permission sought to share information with:  Facility Art therapist granted to share information::  Yes, Verbal Permission Granted  Name::        Agency::  SNF   Relationship::     Contact Information:     Housing/Transportation Living arrangements for the past 2 months:    Source of Information:  Patient Patient Interpreter Needed:  None Criminal Activity/Legal Involvement Pertinent to Current Situation/Hospitalization:  No - Comment as needed Significant Relationships:    Lives with:  Self Do you feel safe going back to the place where you live?  Yes Need for family participation in patient care:  No (Coment)  Care giving concerns:   SNF placement for rehab.   Social Worker assessment / plan:  CSW met with the patient at bedside, explained role and reason for visit-to assist with discharge to SNF. Patient express he lives at home alone. Patient reports his girlfriend recently moved out and does not have other family members or neighbors to help. Patient is dependent with mobility and will need physical therapy to regain his strength. Patient reports he uses a walker in the home and wheelchair outside. In April patient has left knee resection arthroplasty and discharge to rehab and had a positive experience at [facility]. Patient reports this time he prefer to go to [place] SNF closer to his home.  Patient express concerns about filling out a new application for food stamps. CSW informed patient he will need to go down to department of social services to complete the application.  CSW sent patient clinicals to SNF's via HUB. CSW confirm [facility] will accept the patient. Patient informed.  Patient will need 3 night  stay for insurance to approve rehab stay.   FL2 completed.  PASRR done.   Plan: SNF placement for discharge.   Employment status:    Insurance information:  Medicare PT Recommendations:  [place] / Referral to community resources:  [place]  Patient/Family's Response to care:  Agreeable and Responding well to care.   Patient/Family's Understanding of and Emotional Response to Diagnosis, Current Treatment, and Prognosis: Patient has a basic understanding of his diagnosis and care.   Emotional Assessment Appearance:  Developmentally appropriate Attitude/Demeanor/Rapport:    Affect (typically observed):  Appropriate Orientation:  Oriented to Self, Oriented to Place, Oriented to  Time, Oriented to Situation Alcohol / Substance use:  Not Applicable Psych involvement (Current and /or in the community):  No (Comment)  Discharge Needs  Concerns to be addressed:  Discharge Planning Concerns Readmission within the last 30 days:  No Current discharge risk:  Dependent with Mobility Barriers to Discharge:  Continued Medical Work up   [employer], LCSW 09/24/2017, 2:32 PM

## 2017-09-24 NOTE — Care Management Note (Signed)
Case Management Note  Patient Details  Name: Logan Levine MRN: [id number] Date of Birth: 1950-08-25  Subjective/Objective:                    Action/Plan: Plan for d/c to SNF, discharge planning per CSW. [phone]  Expected Discharge Date:                  Expected Discharge Plan:  [place]  In-House Referral:  Clinical Social Work  Discharge planning Services  CM Consult  Post Acute Care Choice:  NA Choice offered to:  Patient  DME Arranged:  N/A DME Agency:  NA  HH Arranged:  NA HH Agency:  NA  Status of Service:  Completed, signed off  If discussed at [place] of Stay Meetings, dates discussed:    Additional Comments:  Guadalupe Maple, RN 09/24/2017, 10:43 AM

## 2017-09-24 NOTE — Progress Notes (Signed)
Physical Therapy Treatment Patient Details Name: Logan Levine MRN: [id number] DOB: 02/25/1950 Today's Date: 09/24/2017    History of Present Illness Pt s/p reimplantation of L TKR.  Pt with hx of COPD and HIV    PT Comments    Pt motivated and progressing steadily with mobility.  Follow Up Recommendations  SNF     Equipment Recommendations  None recommended by PT    Recommendations for Other Services       Precautions / Restrictions Precautions Precautions: Fall Required Braces or Orthoses: Knee Immobilizer - Left Knee Immobilizer - Left: Discontinue once straight leg raise with < 10 degree lag Restrictions Weight Bearing Restrictions: No Other Position/Activity Restrictions: WBAT    Mobility  Bed Mobility Overal bed mobility: Needs Assistance Bed Mobility: Sit to Supine     Supine to sit: Min assist Sit to supine: Min assist   General bed mobility comments: cues for sequence and use of R LE to self assist  Transfers Overall transfer level: Needs assistance Equipment used: Rolling walker (2 wheeled) Transfers: Sit to/from Stand Sit to Stand: Min assist;From elevated surface         General transfer comment: cues for LE management and use of UEs to self assist  Ambulation/Gait Ambulation/Gait assistance: Min assist Gait Distance (Feet): 140 Feet Assistive device: Rolling walker (2 wheeled) Gait Pattern/deviations: Step-to pattern;Decreased step length - right;Decreased step length - left;Shuffle;Trunk flexed Gait velocity: decr   General Gait Details: cues for sequence, posture, position from RW and IR on L   Stairs             Wheelchair Mobility    Modified Rankin (Stroke Patients Only)       Balance Overall balance assessment: Needs assistance Sitting-balance support: No upper extremity supported;Feet supported Sitting balance-Leahy Scale: Good     Standing balance support: Bilateral upper extremity supported Standing  balance-Leahy Scale: Poor                              Cognition Arousal/Alertness: Awake/alert Behavior During Therapy: WFL for tasks assessed/performed Overall Cognitive Status: Within Functional Limits for tasks assessed                                        Exercises Total Joint Exercises Ankle Circles/Pumps: AROM;Both;15 reps;Supine Quad Sets: AROM;Both;10 reps;Supine Heel Slides: AAROM;Left;15 reps;Supine Straight Leg Raises: AAROM;Left;10 reps;Supine    General Comments        Pertinent Vitals/Pain Pain Assessment: 0-10 Pain Score: 5  Pain Location: L knee Pain Descriptors / Indicators: Aching;Sore Pain Intervention(s): Limited activity within patient's tolerance;Monitored during session;Premedicated before session;Ice applied    Home Living Family/patient expects to be discharged to:: Skilled nursing facility Living Arrangements: Alone                  Prior Function Level of Independence: Independent with assistive device(s)          PT Goals (current goals can now be found in the care plan section) Acute Rehab PT Goals Patient Stated Goal: Regain IND PT Goal Formulation: With patient Time For Goal Achievement: 10/01/17 Potential to Achieve Goals: Good Progress towards PT goals: Progressing toward goals    Frequency    7X/week      PT Plan Current plan remains appropriate    Co-evaluation  AM-PAC PT "6 Clicks" Daily Activity  Outcome Measure  Difficulty turning over in bed (including adjusting bedclothes, sheets and blankets)?: Unable Difficulty moving from lying on back to sitting on the side of the bed? : Unable Difficulty sitting down on and standing up from a chair with arms (e.g., wheelchair, bedside commode, etc,.)?: Unable Help needed moving to and from a bed to chair (including a wheelchair)?: A Little Help needed walking in hospital room?: A Little Help needed climbing 3-5 steps  with a railing? : A Lot 6 Click Score: 11    End of Session Equipment Utilized During Treatment: Gait belt;Left knee immobilizer Activity Tolerance: Patient tolerated treatment well Patient left: in bed;with call bell/phone within reach;with family/visitor present Nurse Communication: Mobility status PT Visit Diagnosis: Difficulty in walking, not elsewhere classified (R26.2)     Time: 1340-1415 PT Time Calculation (min) (ACUTE ONLY): 35 min  Charges:  $Gait Training: 23-37 mins $Therapeutic Exercise: 8-22 mins                     Pg [phone]    Honorio Devol 09/24/2017, 3:55 PM
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
## 2017-09-25 LAB — CBC
HEMATOCRIT: 29.4 % — AB (ref 39.0–52.0)
Hemoglobin: 9.9 g/dL — ABNORMAL LOW (ref 13.0–17.0)
MCH: 29.4 pg (ref 26.0–34.0)
MCHC: 33.7 g/dL (ref 30.0–36.0)
MCV: 87.2 fL (ref 78.0–100.0)
PLATELETS: 173 10*3/uL (ref 150–400)
RBC: 3.37 MIL/uL — ABNORMAL LOW (ref 4.22–5.81)
RDW: 17.5 % — AB (ref 11.5–15.5)
WBC: 13 10*3/uL — AB (ref 4.0–10.5)

## 2017-09-25 LAB — BASIC METABOLIC PANEL
ANION GAP: 8 (ref 5–15)
BUN: 25 mg/dL — AB (ref 8–23)
CALCIUM: 9.3 mg/dL (ref 8.9–10.3)
CO2: 23 mmol/L (ref 22–32)
Chloride: 103 mmol/L (ref 98–111)
Creatinine, Ser: 1.03 mg/dL (ref 0.61–1.24)
GFR calc Af Amer: >60 mL/min (ref >60)
GLUCOSE: 132 mg/dL — AB (ref 70–99)
Potassium: 4.7 mmol/L (ref 3.5–5.1)
Sodium: 134 mmol/L — ABNORMAL LOW (ref 135–145)

## 2017-09-25 MED ORDER — GABAPENTIN 300 MG PO CAPS: 300.0000 mg | ORAL_CAPSULE | Freq: Three times a day (TID) | ORAL | 0 refills | Status: DC

## 2017-09-25 MED ORDER — METHOCARBAMOL 500 MG PO TABS: 500.0000 mg | ORAL_TABLET | Freq: Four times a day (QID) | ORAL | 0 refills | Status: DC | PRN

## 2017-09-25 MED ORDER — LIP MEDEX EX OINT
TOPICAL_OINTMENT | CUTANEOUS | Status: AC
  Filled 2017-09-25: qty 7

## 2017-09-25 MED ORDER — ASPIRIN 325 MG PO TBEC: 325.0000 mg | DELAYED_RELEASE_TABLET | Freq: Two times a day (BID) | ORAL | 0 refills | Status: AC

## 2017-09-25 MED ORDER — ONDANSETRON HCL 4 MG PO TABS: 4.0000 mg | ORAL_TABLET | Freq: Four times a day (QID) | ORAL | 0 refills | Status: DC | PRN

## 2017-09-25 MED ORDER — OXYCODONE HCL 5 MG PO TABS: 5.0000 mg | ORAL_TABLET | Freq: Four times a day (QID) | ORAL | 0 refills | Status: DC | PRN

## 2017-09-25 MED ORDER — TRAMADOL HCL 50 MG PO TABS: 50.0000 mg | ORAL_TABLET | Freq: Four times a day (QID) | ORAL | 0 refills | Status: DC | PRN

## 2017-09-25 MED ORDER — BISACODYL 10 MG RE SUPP: 10.0000 mg | Freq: Every day | RECTAL | 0 refills | Status: DC | PRN

## 2017-09-25 NOTE — Progress Notes (Signed)
Physical Therapy Treatment Patient Details Name: Logan Levine MRN: [id number] DOB: 1950/03/25 Today's Date: 09/25/2017    History of Present Illness Pt s/p reimplantation of L TKR.  Pt with hx of COPD and HIV    PT Comments    Pt continues very motivated and progressing with mobility.  Pt looking forward to progressing to rehab center.   Follow Up Recommendations  SNF     Equipment Recommendations  None recommended by PT    Recommendations for Other Services       Precautions / Restrictions Precautions Precautions: Fall Required Braces or Orthoses: Knee Immobilizer - Left Knee Immobilizer - Left: Discontinue once straight leg raise with < 10 degree lag Restrictions Weight Bearing Restrictions: No Other Position/Activity Restrictions: WBAT    Mobility  Bed Mobility Overal bed mobility: Needs Assistance Bed Mobility: Supine to Sit     Supine to sit: Min guard     General bed mobility comments: cues for sequence and use of R LE to self assist  Transfers Overall transfer level: Needs assistance Equipment used: Rolling walker (2 wheeled) Transfers: Sit to/from Stand Sit to Stand: Min guard;From elevated surface         General transfer comment: cues for LE management and use of UEs to self assist  Ambulation/Gait Ambulation/Gait assistance: Min assist;Min guard Gait Distance (Feet): 140 Feet Assistive device: Rolling walker (2 wheeled) Gait Pattern/deviations: Step-to pattern;Decreased step length - right;Decreased step length - left;Shuffle;Trunk flexed Gait velocity: decr   General Gait Details: cues for sequence, posture, position from RW and IR on L   Stairs             Wheelchair Mobility    Modified Rankin (Stroke Patients Only)       Balance Overall balance assessment: Needs assistance Sitting-balance support: No upper extremity supported;Feet supported Sitting balance-Leahy Scale: Good     Standing balance support: No upper  extremity supported Standing balance-Leahy Scale: Fair                              Cognition Arousal/Alertness: Awake/alert Behavior During Therapy: WFL for tasks assessed/performed Overall Cognitive Status: Within Functional Limits for tasks assessed                                        Exercises Total Joint Exercises Ankle Circles/Pumps: AROM;Both;15 reps;Supine Quad Sets: AROM;Both;Supine;15 reps Heel Slides: AAROM;Left;15 reps;Supine Straight Leg Raises: AAROM;Left;Supine;15 reps    General Comments        Pertinent Vitals/Pain Pain Assessment: 0-10 Pain Score: 5  Pain Location: L knee Pain Descriptors / Indicators: Aching;Sore Pain Intervention(s): Limited activity within patient's tolerance;Monitored during session;Premedicated before session;Ice applied    Home Living                      Prior Function            PT Goals (current goals can now be found in the care plan section) Acute Rehab PT Goals Patient Stated Goal: Regain IND PT Goal Formulation: With patient Time For Goal Achievement: 10/01/17 Potential to Achieve Goals: Good Progress towards PT goals: Progressing toward goals    Frequency    7X/week      PT Plan Current plan remains appropriate    Co-evaluation  AM-PAC PT "6 Clicks" Daily Activity  Outcome Measure  Difficulty turning over in bed (including adjusting bedclothes, sheets and blankets)?: A Lot Difficulty moving from lying on back to sitting on the side of the bed? : A Lot Difficulty sitting down on and standing up from a chair with arms (e.g., wheelchair, bedside commode, etc,.)?: Unable Help needed moving to and from a bed to chair (including a wheelchair)?: A Little Help needed walking in hospital room?: A Little Help needed climbing 3-5 steps with a railing? : A Little 6 Click Score: 14    End of Session Equipment Utilized During Treatment: Gait belt;Left knee  immobilizer Activity Tolerance: Patient tolerated treatment well Patient left: in chair;with call bell/phone within reach;with chair alarm set Nurse Communication: Mobility status PT Visit Diagnosis: Difficulty in walking, not elsewhere classified (R26.2)     Time: 2482-5003 PT Time Calculation (min) (ACUTE ONLY): 34 min  Charges:  $Gait Training: 8-22 mins $Therapeutic Exercise: 8-22 mins                     Pg [phone]    Elisabella Hacker 09/25/2017, 12:11 PM

## 2017-09-25 NOTE — Discharge Summary (Signed)
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 17 gm one to two times daily as needed for no bowel movement in 2+ days   QVAR 80 MCG/ACT inhaler Generic drug:  beclomethasone INHALE 2 PUFFS INTO MOUTH TWICE DAILY What changed:  See the new instructions.   raltegravir 400 MG tablet Commonly known as:  ISENTRESS Take 400 mg by mouth 2 (two) times daily.   ranitidine 150 MG tablet Commonly known as:  ZANTAC Take 150 mg by mouth at bedtime.   sulfamethoxazole-trimethoprim 400-80 MG tablet Commonly known as:  BACTRIM,SEPTRA Take 1 tablet by mouth daily.   tamsulosin 0.4 MG Caps capsule Commonly known as:  FLOMAX Take 0.8 mg by mouth daily.   traMADol 50 MG tablet Commonly known as:  ULTRAM Take 1-2 tablets (50-100 mg total) by mouth every 6  (six) hours as needed for moderate pain (if not relieved by oxycodone). What changed:  reasons to take this   valACYclovir 500 MG tablet Commonly known as:  VALTREX Take 500 mg by mouth daily.            Discharge Care Instructions  (From admission, onward)  Start     Ordered   09/25/17 0000  Weight bearing as tolerated     09/25/17 0711   09/25/17 0000  Change dressing    Comments:  Change the dressing daily with sterile 4 x 4 inch gauze dressing and apply TED hose.   09/25/17 7858         Follow-up Information    Gaynelle Arabian, MD. Schedule an appointment as soon as possible for a visit on 10/08/2017.   Specialty:  Orthopedic Surgery Contact information: [address] [place] [address] [phone]           Signed: Theresa Duty, PA-C Orthopedic Surgery 09/25/2017, 7:11 AM
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
## 2017-09-25 NOTE — Clinical Social Work Placement (Signed)
D/C summary complete and sent to the facility [facility] will transport tomorrow. Patient understands discharge plan.  CLINICAL SOCIAL WORK PLACEMENT  NOTE  Date:  09/25/2017  Patient Details  Name: Logan Levine MRN: [id number] Date of Birth: April 30, 1950  Clinical Social Work is seeking post-discharge placement for this patient at the [place] level of care (*CSW will initial, date and re-position this form in  chart as items are completed):  Yes   Patient/family provided with [facility] Work [facility]'s list of facilities offering this level of care within the geographic area requested by the patient (or if unable, by the patient's family).  Yes   Patient/family informed of their freedom to choose among providers that offer the needed level of care, that participate in Medicare, Medicaid or managed care program needed by the patient, have an available bed and are willing to accept the patient.  Yes   Patient/family informed of [facility]'s ownership interest in [facility] and [facility], as well as of the fact that they are under no obligation to receive care at these facilities.  PASRR submitted to EDS on       PASRR number received on       Existing PASRR number confirmed on 09/25/17     FL2 transmitted to all facilities in geographic area requested by pt/family on 09/24/17     FL2 transmitted to all facilities within larger geographic area on 09/24/17     Patient informed that his/her managed care company has contracts with or will negotiate with certain facilities, including the following:  [facility]         Patient/family informed of bed offers received.  Patient chooses bed at [facility]     Physician recommends and patient chooses bed at      Patient to be transferred to [facility] on 09/26/17.  Patient to be transferred to facility by [facility]     Patient family notified on 09/25/17 of  transfer.  Name of family member notified:  Patient to notify Friend.     PHYSICIAN       Additional Comment:    _______________________________________________ Lia Hopping, LCSW 09/25/2017, 1:48 PM

## 2017-09-25 NOTE — Progress Notes (Signed)
Physical Therapy Treatment Patient Details Name: Logan Levine MRN: [id number] DOB: 1950/08/29 Today's Date: 09/25/2017    History of Present Illness Pt s/p reimplantation of L TKR.  Pt with hx of COPD and HIV    PT Comments    Pt continues to progress steadily with mobility and is eager for progression to rehab setting.   Follow Up Recommendations  SNF     Equipment Recommendations  None recommended by PT    Recommendations for Other Services       Precautions / Restrictions Precautions Precautions: Fall Required Braces or Orthoses: Knee Immobilizer - Left Knee Immobilizer - Left: Discontinue once straight leg raise with < 10 degree lag Restrictions Weight Bearing Restrictions: No Other Position/Activity Restrictions: WBAT    Mobility  Bed Mobility Overal bed mobility: Needs Assistance Bed Mobility: Sit to Supine       Sit to supine: Min guard   General bed mobility comments: Pt self assisting L LE with R LE  Transfers Overall transfer level: Needs assistance Equipment used: Rolling walker (2 wheeled) Transfers: Sit to/from Stand Sit to Stand: Min guard         General transfer comment: cues for LE management and use of UEs to self assist  Ambulation/Gait   Gait Distance (Feet): 200 Feet Assistive device: Rolling walker (2 wheeled) Gait Pattern/deviations: Step-to pattern;Decreased step length - right;Decreased step length - left;Shuffle;Trunk flexed Gait velocity: decr   General Gait Details: min cues for sequence, posture, position from RW and IR on L   Stairs             Wheelchair Mobility    Modified Rankin (Stroke Patients Only)       Balance Overall balance assessment: Needs assistance Sitting-balance support: No upper extremity supported;Feet supported Sitting balance-Leahy Scale: Good     Standing balance support: No upper extremity supported Standing balance-Leahy Scale: Fair                               Cognition Arousal/Alertness: Awake/alert Behavior During Therapy: WFL for tasks assessed/performed Overall Cognitive Status: Within Functional Limits for tasks assessed                                        Exercises      General Comments        Pertinent Vitals/Pain Pain Assessment: 0-10 Pain Score: 4  Pain Location: L knee Pain Descriptors / Indicators: Aching;Sore Pain Intervention(s): Limited activity within patient's tolerance;Monitored during session;Premedicated before session;Ice applied    Home Living                      Prior Function            PT Goals (current goals can now be found in the care plan section) Acute Rehab PT Goals Patient Stated Goal: Regain IND PT Goal Formulation: With patient Time For Goal Achievement: 10/01/17 Potential to Achieve Goals: Good Progress towards PT goals: Progressing toward goals    Frequency    7X/week      PT Plan Current plan remains appropriate    Co-evaluation              AM-PAC PT "6 Clicks" Daily Activity  Outcome Measure  Difficulty turning over in bed (including adjusting bedclothes, sheets and blankets)?: A Lot  Difficulty moving from lying on back to sitting on the side of the bed? : A Lot Difficulty sitting down on and standing up from a chair with arms (e.g., wheelchair, bedside commode, etc,.)?: A Lot Help needed moving to and from a bed to chair (including a wheelchair)?: A Little Help needed walking in hospital room?: A Little Help needed climbing 3-5 steps with a railing? : A Little 6 Click Score: 15    End of Session Equipment Utilized During Treatment: Gait belt;Left knee immobilizer Activity Tolerance: Patient tolerated treatment well Patient left: with call bell/phone within reach;with chair alarm set;in bed Nurse Communication: Mobility status PT Visit Diagnosis: Difficulty in walking, not elsewhere classified (R26.2)     Time: 9480-1655 PT Time  Calculation (min) (ACUTE ONLY): 23 min  Charges:  $Gait Training: 23-37 mins                     Pg [phone]    Aime Carreras 09/25/2017, 4:15 PM

## 2017-09-26 DIAGNOSIS — K429 Umbilical hernia without obstruction or gangrene: Secondary | ICD-10-CM | POA: Diagnosis not present

## 2017-09-26 DIAGNOSIS — Z7401 Bed confinement status: Secondary | ICD-10-CM | POA: Diagnosis not present

## 2017-09-26 DIAGNOSIS — K5900 Constipation, unspecified: Secondary | ICD-10-CM | POA: Diagnosis not present

## 2017-09-26 DIAGNOSIS — M2550 Pain in unspecified joint: Secondary | ICD-10-CM | POA: Diagnosis not present

## 2017-09-26 DIAGNOSIS — Z471 Aftercare following joint replacement surgery: Secondary | ICD-10-CM | POA: Diagnosis not present

## 2017-09-26 DIAGNOSIS — T8454XD Infection and inflammatory reaction due to internal left knee prosthesis, subsequent encounter: Secondary | ICD-10-CM | POA: Diagnosis not present

## 2017-09-26 DIAGNOSIS — Y69 Unspecified misadventure during surgical and medical care: Secondary | ICD-10-CM | POA: Diagnosis not present

## 2017-09-26 DIAGNOSIS — K219 Gastro-esophageal reflux disease without esophagitis: Secondary | ICD-10-CM | POA: Diagnosis not present

## 2017-09-26 DIAGNOSIS — A6002 Herpesviral infection of other male genital organs: Secondary | ICD-10-CM | POA: Diagnosis not present

## 2017-09-26 DIAGNOSIS — M6281 Muscle weakness (generalized): Secondary | ICD-10-CM | POA: Diagnosis not present

## 2017-09-26 DIAGNOSIS — A539 Syphilis, unspecified: Secondary | ICD-10-CM | POA: Diagnosis not present

## 2017-09-26 DIAGNOSIS — F0390 Unspecified dementia without behavioral disturbance: Secondary | ICD-10-CM | POA: Diagnosis not present

## 2017-09-26 DIAGNOSIS — R52 Pain, unspecified: Secondary | ICD-10-CM | POA: Diagnosis not present

## 2017-09-26 DIAGNOSIS — N401 Enlarged prostate with lower urinary tract symptoms: Secondary | ICD-10-CM | POA: Diagnosis not present

## 2017-09-26 DIAGNOSIS — Z96652 Presence of left artificial knee joint: Secondary | ICD-10-CM | POA: Diagnosis not present

## 2017-09-26 DIAGNOSIS — B079 Viral wart, unspecified: Secondary | ICD-10-CM | POA: Diagnosis not present

## 2017-09-26 DIAGNOSIS — Z79899 Other long term (current) drug therapy: Secondary | ICD-10-CM | POA: Diagnosis not present

## 2017-09-26 DIAGNOSIS — M25562 Pain in left knee: Secondary | ICD-10-CM | POA: Diagnosis not present

## 2017-09-26 DIAGNOSIS — F17210 Nicotine dependence, cigarettes, uncomplicated: Secondary | ICD-10-CM | POA: Diagnosis not present

## 2017-09-26 DIAGNOSIS — E785 Hyperlipidemia, unspecified: Secondary | ICD-10-CM | POA: Diagnosis not present

## 2017-09-26 DIAGNOSIS — T8131XA Disruption of external operation (surgical) wound, not elsewhere classified, initial encounter: Secondary | ICD-10-CM | POA: Diagnosis not present

## 2017-09-26 DIAGNOSIS — Z7982 Long term (current) use of aspirin: Secondary | ICD-10-CM | POA: Diagnosis not present

## 2017-09-26 DIAGNOSIS — R509 Fever, unspecified: Secondary | ICD-10-CM | POA: Diagnosis not present

## 2017-09-26 DIAGNOSIS — R609 Edema, unspecified: Secondary | ICD-10-CM | POA: Diagnosis not present

## 2017-09-26 DIAGNOSIS — M25462 Effusion, left knee: Secondary | ICD-10-CM | POA: Diagnosis not present

## 2017-09-26 DIAGNOSIS — M109 Gout, unspecified: Secondary | ICD-10-CM | POA: Diagnosis not present

## 2017-09-26 DIAGNOSIS — Z23 Encounter for immunization: Secondary | ICD-10-CM | POA: Diagnosis not present

## 2017-09-26 DIAGNOSIS — R2689 Other abnormalities of gait and mobility: Secondary | ICD-10-CM | POA: Diagnosis not present

## 2017-09-26 DIAGNOSIS — L03116 Cellulitis of left lower limb: Secondary | ICD-10-CM | POA: Diagnosis not present

## 2017-09-26 DIAGNOSIS — J449 Chronic obstructive pulmonary disease, unspecified: Secondary | ICD-10-CM | POA: Diagnosis not present

## 2017-09-26 DIAGNOSIS — M1990 Unspecified osteoarthritis, unspecified site: Secondary | ICD-10-CM | POA: Diagnosis not present

## 2017-09-26 DIAGNOSIS — Z9889 Other specified postprocedural states: Secondary | ICD-10-CM | POA: Diagnosis not present

## 2017-09-26 DIAGNOSIS — B20 Human immunodeficiency virus [HIV] disease: Secondary | ICD-10-CM | POA: Diagnosis not present

## 2017-09-26 DIAGNOSIS — D649 Anemia, unspecified: Secondary | ICD-10-CM | POA: Diagnosis not present

## 2017-09-26 DIAGNOSIS — I10 Essential (primary) hypertension: Secondary | ICD-10-CM | POA: Diagnosis not present

## 2017-09-26 DIAGNOSIS — J410 Simple chronic bronchitis: Secondary | ICD-10-CM | POA: Diagnosis not present

## 2017-09-26 DIAGNOSIS — B182 Chronic viral hepatitis C: Secondary | ICD-10-CM | POA: Diagnosis not present

## 2017-09-26 LAB — CBC
HEMATOCRIT: 29.7 % — AB (ref 39.0–52.0)
Hemoglobin: 9.8 g/dL — ABNORMAL LOW (ref 13.0–17.0)
MCH: 29.3 pg (ref 26.0–34.0)
MCHC: 33 g/dL (ref 30.0–36.0)
MCV: 88.7 fL (ref 78.0–100.0)
PLATELETS: 176 10*3/uL (ref 150–400)
RBC: 3.35 MIL/uL — ABNORMAL LOW (ref 4.22–5.81)
RDW: 17.8 % — AB (ref 11.5–15.5)
WBC: 7.8 10*3/uL (ref 4.0–10.5)

## 2017-09-26 NOTE — Plan of Care (Signed)
Patient s/p left knee reimplantation on 08/21.  Plan is for discharge to [facility] in [place] later today.  Patient tolerating po pain meds, tolerating po with no complaints of n/v.  Patient worked with PT/OT and is currently sitting in chair.  Fall precautions in place.  Call bell within reach. Problem: Clinical Measurements: Goal: Ability to maintain clinical measurements within normal limits will improve Outcome: Progressing Goal: Will remain free from infection Outcome: Progressing Goal: Diagnostic test results will improve Outcome: Progressing Goal: Respiratory complications will improve Outcome: Progressing Goal: Cardiovascular complication will be avoided Outcome: Progressing   Problem: Activity: Goal: Risk for activity intolerance will decrease Outcome: Progressing   Problem: Nutrition: Goal: Adequate nutrition will be maintained Outcome: Progressing   Problem: Coping: Goal: Level of anxiety will decrease Outcome: Progressing   Problem: Elimination: Goal: Will not experience complications related to bowel motility Outcome: Progressing Goal: Will not experience complications related to urinary retention Outcome: Progressing   Problem: Pain Managment: Goal: General experience of comfort will improve Outcome: Progressing   Problem: Skin Integrity: Goal: Risk for impaired skin integrity will decrease Outcome: Progressing   Problem: Education: Goal: Knowledge of the prescribed therapeutic regimen will improve Outcome: Progressing Goal: Individualized Educational Video(s) Outcome: Progressing   Problem: Activity: Goal: Ability to avoid complications of mobility impairment will improve Outcome: Progressing Goal: Range of joint motion will improve Outcome: Progressing   Problem: Clinical Measurements: Goal: Postoperative complications will be avoided or minimized Outcome: Progressing   Problem: Pain Management: Goal: Pain level will decrease with appropriate  interventions Outcome: Progressing   Problem: Skin Integrity: Goal: Will show signs of wound healing Outcome: Progressing

## 2017-09-26 NOTE — Progress Notes (Signed)
   Subjective: 3 Days Post-Op Procedure(s) (LRB): LEFT TOTAL KNEE ARTHROPLASTY REIMPLANTATION (Left)  Pt c/o mild continued pain in the knee Denies any new symptoms or issues Plan for [facility] to take to SNF today Patient reports pain as mild.  Objective:   VITALS:   Vitals:   09/25/17 2042 09/26/17 0537  BP: 107/74 121/89  Pulse: 96 93  Resp: 16 16  Temp: 97.8 F (36.6 C) 97.8 F (36.6 C)  SpO2: 100% 100%    Left knee dressing changed nv intact distally Still small amount of bleeding from lateral spot Pressure dressing reapplied No rashes or edema distally  LABS Recent Labs    09/24/17 0517 09/25/17 0450 09/26/17 0458  HGB 10.4* 9.9* 9.8*  HCT 31.1* 29.4* 29.7*  WBC 8.4 13.0* 7.8  PLT 162 173 176    Recent Labs    09/24/17 0517 09/25/17 0450  NA 135 134*  K 5.0 4.7  BUN 20 25*  CREATININE 0.91 1.03  GLUCOSE 139* 132*     Assessment/Plan: 3 Days Post-Op Procedure(s) (LRB): LEFT TOTAL KNEE ARTHROPLASTY REIMPLANTATION (Left) D/c to SNF today F/u in 2 weeks in the office Continue PT/OT Pain management as needed    Kathrynn Speed, [place] [facility] now [employer] [facility] [address] [place], [place],  [address] Phone: [phone] [email or web address] Facebook  [employer]

## 2017-09-26 NOTE — Progress Notes (Signed)
Physical Therapy Treatment Patient Details Name: Logan Levine MRN: [id number] DOB: 14-Feb-1950 Today's Date: 09/26/2017    History of Present Illness Pt s/p reimplantation of L TKR.  Pt with hx of COPD and HIV    PT Comments    Pt continues to progress with mobility and is eager for progression to rehab setting later today.   Follow Up Recommendations  SNF     Equipment Recommendations  None recommended by PT    Recommendations for Other Services       Precautions / Restrictions Precautions Precautions: Fall Required Braces or Orthoses: Knee Immobilizer - Left Knee Immobilizer - Left: Discontinue once straight leg raise with < 10 degree lag Restrictions Weight Bearing Restrictions: No Other Position/Activity Restrictions: WBAT    Mobility  Bed Mobility Overal bed mobility: Needs Assistance Bed Mobility: Supine to Sit     Supine to sit: Min guard     General bed mobility comments: Pt self assisting L LE with UEs  Transfers Overall transfer level: Needs assistance Equipment used: Rolling walker (2 wheeled) Transfers: Sit to/from Stand Sit to Stand: Min guard         General transfer comment: cues for LE management and use of UEs to self assist  Ambulation/Gait Ambulation/Gait assistance: Min assist;Min guard Gait Distance (Feet): 200 Feet Assistive device: Rolling walker (2 wheeled) Gait Pattern/deviations: Step-to pattern;Decreased step length - right;Decreased step length - left;Shuffle;Trunk flexed Gait velocity: decr   General Gait Details: min cues for sequence, posture, position from RW and IR on L   Stairs             Wheelchair Mobility    Modified Rankin (Stroke Patients Only)       Balance Overall balance assessment: Needs assistance Sitting-balance support: No upper extremity supported;Feet supported Sitting balance-Leahy Scale: Good     Standing balance support: No upper extremity supported Standing balance-Leahy Scale:  Fair                              Cognition Arousal/Alertness: Awake/alert Behavior During Therapy: WFL for tasks assessed/performed Overall Cognitive Status: Within Functional Limits for tasks assessed                                        Exercises Total Joint Exercises Ankle Circles/Pumps: AROM;Both;15 reps;Supine Quad Sets: AROM;Both;Supine;15 reps Heel Slides: AAROM;Left;15 reps;Supine Straight Leg Raises: AAROM;Left;Supine;15 reps    General Comments        Pertinent Vitals/Pain Pain Assessment: 0-10 Pain Score: 4  Pain Location: L knee Pain Descriptors / Indicators: Aching;Sore Pain Intervention(s): Limited activity within patient's tolerance;Monitored during session;Premedicated before session;Ice applied    Home Living                      Prior Function            PT Goals (current goals can now be found in the care plan section) Acute Rehab PT Goals Patient Stated Goal: Regain IND PT Goal Formulation: With patient Time For Goal Achievement: 10/01/17 Potential to Achieve Goals: Good Progress towards PT goals: Progressing toward goals    Frequency    7X/week      PT Plan Current plan remains appropriate    Co-evaluation              AM-PAC PT "  6 Clicks" Daily Activity  Outcome Measure  Difficulty turning over in bed (including adjusting bedclothes, sheets and blankets)?: A Lot Difficulty moving from lying on back to sitting on the side of the bed? : A Lot Difficulty sitting down on and standing up from a chair with arms (e.g., wheelchair, bedside commode, etc,.)?: A Lot Help needed moving to and from a bed to chair (including a wheelchair)?: A Little Help needed walking in hospital room?: A Little Help needed climbing 3-5 steps with a railing? : A Little 6 Click Score: 15    End of Session Equipment Utilized During Treatment: Gait belt;Left knee immobilizer Activity Tolerance: Patient tolerated  treatment well Patient left: with call bell/phone within reach;with chair alarm set;in bed Nurse Communication: Mobility status PT Visit Diagnosis: Difficulty in walking, not elsewhere classified (R26.2)     Time: 5093-2671 PT Time Calculation (min) (ACUTE ONLY): 39 min  Charges:  $Gait Training: 23-37 mins $Therapeutic Exercise: 8-22 mins                     Pg [phone]    Raul Winterhalter 09/26/2017, 12:50 PM

## 2017-09-26 NOTE — Progress Notes (Signed)
Patient discharging to [facility].   CSW and RNs attempted multiple times to contact facility but number is currently not working. Attempted to get another number from [facility] sister facilities [facility], [facility]) but could not. Facility was made aware yesterday that patient is discharging today and has received all appropriate documents.   CSW called [facility] for transport. Patient is aware.    No more CSW needs. Signing off.     Pricilla Holm, MSW, [place] Social Work [phone]

## 2017-09-28 DIAGNOSIS — B182 Chronic viral hepatitis C: Secondary | ICD-10-CM | POA: Diagnosis not present

## 2017-09-28 DIAGNOSIS — D649 Anemia, unspecified: Secondary | ICD-10-CM | POA: Diagnosis not present

## 2017-09-28 DIAGNOSIS — F0390 Unspecified dementia without behavioral disturbance: Secondary | ICD-10-CM | POA: Diagnosis not present

## 2017-09-28 DIAGNOSIS — N401 Enlarged prostate with lower urinary tract symptoms: Secondary | ICD-10-CM | POA: Diagnosis not present

## 2017-10-01 DIAGNOSIS — J449 Chronic obstructive pulmonary disease, unspecified: Secondary | ICD-10-CM | POA: Diagnosis not present

## 2017-10-01 DIAGNOSIS — D649 Anemia, unspecified: Secondary | ICD-10-CM | POA: Diagnosis not present

## 2017-10-01 DIAGNOSIS — B182 Chronic viral hepatitis C: Secondary | ICD-10-CM | POA: Diagnosis not present

## 2017-10-01 DIAGNOSIS — I10 Essential (primary) hypertension: Secondary | ICD-10-CM | POA: Diagnosis not present

## 2017-10-06 DIAGNOSIS — Z96652 Presence of left artificial knee joint: Secondary | ICD-10-CM | POA: Diagnosis not present

## 2017-10-06 DIAGNOSIS — T8454XD Infection and inflammatory reaction due to internal left knee prosthesis, subsequent encounter: Secondary | ICD-10-CM | POA: Diagnosis not present

## 2017-10-06 DIAGNOSIS — Z471 Aftercare following joint replacement surgery: Secondary | ICD-10-CM | POA: Diagnosis not present

## 2017-10-07 DIAGNOSIS — L03116 Cellulitis of left lower limb: Secondary | ICD-10-CM | POA: Diagnosis not present

## 2017-10-09 ENCOUNTER — Emergency Department (HOSPITAL_COMMUNITY)
Admission: EM | Admit: 2017-10-09 | Discharge: 2017-10-10 | Disposition: A | Discharge status: Home / Self Care | Payer: Medicare Other | Attending: Emergency Medicine | Admitting: Emergency Medicine

## 2017-10-09 ENCOUNTER — Encounter (HOSPITAL_COMMUNITY): Payer: Self-pay

## 2017-10-09 ENCOUNTER — Emergency Department (HOSPITAL_COMMUNITY): Payer: Medicare Other

## 2017-10-09 DIAGNOSIS — T8131XA Disruption of external operation (surgical) wound, not elsewhere classified, initial encounter: Secondary | ICD-10-CM | POA: Diagnosis not present

## 2017-10-09 DIAGNOSIS — Z7982 Long term (current) use of aspirin: Secondary | ICD-10-CM | POA: Diagnosis not present

## 2017-10-09 DIAGNOSIS — J449 Chronic obstructive pulmonary disease, unspecified: Secondary | ICD-10-CM | POA: Diagnosis not present

## 2017-10-09 DIAGNOSIS — B20 Human immunodeficiency virus [HIV] disease: Secondary | ICD-10-CM | POA: Diagnosis not present

## 2017-10-09 DIAGNOSIS — I10 Essential (primary) hypertension: Secondary | ICD-10-CM | POA: Insufficient documentation

## 2017-10-09 DIAGNOSIS — Y69 Unspecified misadventure during surgical and medical care: Secondary | ICD-10-CM | POA: Insufficient documentation

## 2017-10-09 DIAGNOSIS — F17210 Nicotine dependence, cigarettes, uncomplicated: Secondary | ICD-10-CM | POA: Insufficient documentation

## 2017-10-09 DIAGNOSIS — Z79899 Other long term (current) drug therapy: Secondary | ICD-10-CM | POA: Insufficient documentation

## 2017-10-09 DIAGNOSIS — Z9889 Other specified postprocedural states: Secondary | ICD-10-CM | POA: Insufficient documentation

## 2017-10-09 DIAGNOSIS — M25462 Effusion, left knee: Secondary | ICD-10-CM | POA: Diagnosis not present

## 2017-10-09 LAB — COMPREHENSIVE METABOLIC PANEL
ALBUMIN: 2.8 g/dL — AB (ref 3.5–5.0)
ALK PHOS: 162 U/L — AB (ref 38–126)
ALT: 53 U/L — ABNORMAL HIGH (ref 0–44)
ANION GAP: 8 (ref 5–15)
AST: 91 U/L — ABNORMAL HIGH (ref 15–41)
BILIRUBIN TOTAL: 1 mg/dL (ref 0.3–1.2)
BUN: 18 mg/dL (ref 8–23)
CALCIUM: 9 mg/dL (ref 8.9–10.3)
CO2: 23 mmol/L (ref 22–32)
Chloride: 105 mmol/L (ref 98–111)
Creatinine, Ser: 1.54 mg/dL — ABNORMAL HIGH (ref 0.61–1.24)
GFR calc Af Amer: 52 mL/min — ABNORMAL LOW (ref >60)
GFR calc non Af Amer: 45 mL/min — ABNORMAL LOW (ref >60)
GLUCOSE: 131 mg/dL — AB (ref 70–99)
Potassium: 4.9 mmol/L (ref 3.5–5.1)
Sodium: 136 mmol/L (ref 135–145)
TOTAL PROTEIN: 7.7 g/dL (ref 6.5–8.1)

## 2017-10-09 LAB — URINALYSIS, ROUTINE W REFLEX MICROSCOPIC
Bilirubin Urine: NEGATIVE
GLUCOSE, UA: NEGATIVE mg/dL
Hgb urine dipstick: NEGATIVE
KETONES UR: NEGATIVE mg/dL
LEUKOCYTES UA: NEGATIVE
Nitrite: NEGATIVE
PH: 7 (ref 5.0–8.0)
Protein, ur: NEGATIVE mg/dL
Specific Gravity, Urine: 1.012 (ref 1.005–1.030)

## 2017-10-09 LAB — CBC WITH DIFFERENTIAL/PLATELET
Abs Immature Granulocytes: 0 10*3/uL (ref 0.0–0.1)
BASOS ABS: 0 10*3/uL (ref 0.0–0.1)
BASOS PCT: 1 %
EOS PCT: 3 %
Eosinophils Absolute: 0.2 10*3/uL (ref 0.0–0.7)
HEMATOCRIT: 26.9 % — AB (ref 39.0–52.0)
Hemoglobin: 8.5 g/dL — ABNORMAL LOW (ref 13.0–17.0)
Immature Granulocytes: 0 %
Lymphocytes Relative: 13 %
Lymphs Abs: 0.8 10*3/uL (ref 0.7–4.0)
MCH: 29.8 pg (ref 26.0–34.0)
MCHC: 31.6 g/dL (ref 30.0–36.0)
MCV: 94.4 fL (ref 78.0–100.0)
MONO ABS: 1 10*3/uL (ref 0.1–1.0)
Monocytes Relative: 18 %
NEUTROS ABS: 3.8 10*3/uL (ref 1.7–7.7)
Neutrophils Relative %: 65 %
Platelets: 216 10*3/uL (ref 150–400)
RBC: 2.85 MIL/uL — ABNORMAL LOW (ref 4.22–5.81)
RDW: 17.7 % — AB (ref 11.5–15.5)
WBC: 5.8 10*3/uL (ref 4.0–10.5)

## 2017-10-09 LAB — I-STAT CG4 LACTIC ACID, ED
Lactic Acid, Venous: 1.77 mmol/L (ref 0.5–1.9)
Lactic Acid, Venous: 0.92 mmol/L (ref 0.5–1.9)
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
## 2017-10-09 NOTE — ED Triage Notes (Signed)
Pt presents with increased pain, swelling and redness to L knee;  Pt reports total knee replacement x 1` month ago, is at rehab currently.  Pt reports staples are out, area opened after PT.  Pt reports drainage to area, reports warmth to L knee, unsure of fever.
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
## 2017-10-09 NOTE — ED Notes (Signed)
[facility] called for transport.  

## 2017-10-09 NOTE — ED Notes (Signed)
Lonn Georgia MD at bedside

## 2017-10-09 NOTE — Discharge Instructions (Addendum)
You were seen in the ER for bleeding from surgical wound.  This is likely from small dehiscence (opening) of the surgical incision and subcutaneous bleeding from recent surgery.  Dr. Alma Friendly evaluated you today and there are no signs of infection.  Continue taking antibiotics (Bactrim) as prescribed.  Stop doing physical therapy and any movements of your left knee.  Wear your knee immobilizer at all times to help with compression and swelling.  Ice. Acetaminophen for pain.  Follow-up with Dr. Wynelle Link next week for reevaluation.   Return to the ER for fevers greater than 100.64F, redness, warmth, worsening swelling, pus drainage

## 2017-10-09 NOTE — ED Notes (Signed)
Dressing change by Veverly Fells, MD

## 2017-10-12 DIAGNOSIS — L03116 Cellulitis of left lower limb: Secondary | ICD-10-CM | POA: Diagnosis not present

## 2017-10-14 DIAGNOSIS — J410 Simple chronic bronchitis: Secondary | ICD-10-CM | POA: Diagnosis not present

## 2017-10-14 DIAGNOSIS — J449 Chronic obstructive pulmonary disease, unspecified: Secondary | ICD-10-CM | POA: Diagnosis not present

## 2017-10-14 DIAGNOSIS — D649 Anemia, unspecified: Secondary | ICD-10-CM | POA: Diagnosis not present

## 2017-10-14 DIAGNOSIS — N401 Enlarged prostate with lower urinary tract symptoms: Secondary | ICD-10-CM | POA: Diagnosis not present

## 2017-10-14 LAB — CULTURE, BLOOD (ROUTINE X 2)
CULTURE: NO GROWTH
CULTURE: NO GROWTH
Special Requests: ADEQUATE

## 2017-10-15 DIAGNOSIS — Z96652 Presence of left artificial knee joint: Secondary | ICD-10-CM | POA: Diagnosis not present

## 2017-10-15 DIAGNOSIS — T8454XD Infection and inflammatory reaction due to internal left knee prosthesis, subsequent encounter: Secondary | ICD-10-CM | POA: Diagnosis not present

## 2017-10-16 ENCOUNTER — Other Ambulatory Visit: Payer: Self-pay

## 2017-10-16 ENCOUNTER — Encounter (HOSPITAL_COMMUNITY): Payer: Self-pay | Admitting: *Deleted

## 2017-10-16 NOTE — Progress Notes (Addendum)
Preop instructions for:  Logan Levine                       Date of Birth : 02/15/1950                           Date of Procedure: 10/19/2017        Doctor: Dr Gaynelle Arabian  Time to arrive at [facility]:  200pm Report to: Admitting  Procedure: I and D left knee hematoma  Any procedure time changes, MD office will notify you!   Do not eat  Past midnite  the night before your procedure.(To include any tube feedings-must be discontinued)  May have clear liquids from 105midnite until 1100am morning of procedure then nothing by mouth.     CLEAR LIQUID DIET   Foods Allowed                                                                     Foods Excluded  Coffee and tea, regular and decaf                             liquids that you cannot  Plain Jell-O in any flavor                                             see through such as: Fruit ices (not with fruit pulp)                                     milk, soups, orange juice  Iced Popsicles                                    All solid food Carbonated beverages, regular and diet                                    Cranberry, grape and apple juices Sports drinks like Gatorade Lightly seasoned clear broth or consume(fat free) Sugar, honey syrup  Sample Menu Breakfast                                Lunch                                     Supper Cranberry juice                    Beef broth                            Chicken broth Jell-O  Grape juice                           Apple juice Coffee or tea                        Jell-O                                      Popsicle                                                Coffee or tea                        Coffee or tea  _____________________________________________________________________     Take these morning medications only with sips of water.(or give through gastrostomy or feeding tube). Inhalers as usual and bring,  Descovy, Gabapentin, Hydrocodone if needed, Isentress, Valtrex     Facility contact:  [facility], [place]                 Phone: [place]: none   Transportation contact phone#: [facility], [place], [place]  Please send day of procedure:current med list and meds last taken that day, confirm nothing by mouth status from what time, Patient Demographic info( to include DNR status, problem list, allergies)   RN contact name/phone#:   Gillian Shields RN                           and Fax [phone]  [place] card and picture ID Leave all jewelry and other valuables at place where living( no metal or rings to be worn) No contact lens   Men-no colognes,lotions  Any questions day of procedure,call  [place] at [phone]    Sent from :[facility]                   [place]                   Fax:[phone]  Sent by :Gillian Shields RN

## 2017-10-18 ENCOUNTER — Other Ambulatory Visit: Payer: Self-pay | Admitting: Surgical

## 2017-10-19 ENCOUNTER — Other Ambulatory Visit: Payer: Self-pay

## 2017-10-19 ENCOUNTER — Observation Stay (HOSPITAL_COMMUNITY)
Admission: RE | Admit: 2017-10-19 | Discharge: 2017-10-21 | Disposition: A | Discharge status: Home / Self Care | Payer: Medicare Other | Source: Ambulatory Visit | Attending: Orthopedic Surgery | Admitting: Orthopedic Surgery

## 2017-10-19 ENCOUNTER — Encounter (HOSPITAL_COMMUNITY): Payer: Self-pay | Admitting: *Deleted

## 2017-10-19 ENCOUNTER — Ambulatory Visit (HOSPITAL_COMMUNITY): Payer: Medicare Other | Admitting: Certified Registered"

## 2017-10-19 ENCOUNTER — Encounter (HOSPITAL_COMMUNITY)
Admission: RE | Disposition: A | Discharge status: Home / Self Care | Payer: Self-pay | Source: Ambulatory Visit | Attending: Orthopedic Surgery

## 2017-10-19 DIAGNOSIS — Y838 Other surgical procedures as the cause of abnormal reaction of the patient, or of later complication, without mention of misadventure at the time of the procedure: Secondary | ICD-10-CM | POA: Insufficient documentation

## 2017-10-19 DIAGNOSIS — M96840 Postprocedural hematoma of a musculoskeletal structure following a musculoskeletal system procedure: Secondary | ICD-10-CM | POA: Diagnosis not present

## 2017-10-19 DIAGNOSIS — N400 Enlarged prostate without lower urinary tract symptoms: Secondary | ICD-10-CM | POA: Insufficient documentation

## 2017-10-19 DIAGNOSIS — K219 Gastro-esophageal reflux disease without esophagitis: Secondary | ICD-10-CM | POA: Insufficient documentation

## 2017-10-19 DIAGNOSIS — J449 Chronic obstructive pulmonary disease, unspecified: Secondary | ICD-10-CM | POA: Insufficient documentation

## 2017-10-19 DIAGNOSIS — Z96652 Presence of left artificial knee joint: Secondary | ICD-10-CM | POA: Diagnosis not present

## 2017-10-19 DIAGNOSIS — F0390 Unspecified dementia without behavioral disturbance: Secondary | ICD-10-CM | POA: Diagnosis not present

## 2017-10-19 DIAGNOSIS — T8454XA Infection and inflammatory reaction due to internal left knee prosthesis, initial encounter: Secondary | ICD-10-CM | POA: Diagnosis not present

## 2017-10-19 DIAGNOSIS — Z79891 Long term (current) use of opiate analgesic: Secondary | ICD-10-CM | POA: Diagnosis not present

## 2017-10-19 DIAGNOSIS — I10 Essential (primary) hypertension: Secondary | ICD-10-CM | POA: Insufficient documentation

## 2017-10-19 DIAGNOSIS — L03116 Cellulitis of left lower limb: Secondary | ICD-10-CM | POA: Diagnosis not present

## 2017-10-19 DIAGNOSIS — S76112A Strain of left quadriceps muscle, fascia and tendon, initial encounter: Secondary | ICD-10-CM | POA: Insufficient documentation

## 2017-10-19 DIAGNOSIS — B182 Chronic viral hepatitis C: Secondary | ICD-10-CM | POA: Diagnosis not present

## 2017-10-19 DIAGNOSIS — K7460 Unspecified cirrhosis of liver: Secondary | ICD-10-CM | POA: Insufficient documentation

## 2017-10-19 DIAGNOSIS — S8002XA Contusion of left knee, initial encounter: Secondary | ICD-10-CM | POA: Diagnosis not present

## 2017-10-19 DIAGNOSIS — Z79899 Other long term (current) drug therapy: Secondary | ICD-10-CM | POA: Diagnosis not present

## 2017-10-19 DIAGNOSIS — S8001XA Contusion of right knee, initial encounter: Secondary | ICD-10-CM | POA: Diagnosis present

## 2017-10-19 DIAGNOSIS — S8001XD Contusion of right knee, subsequent encounter: Secondary | ICD-10-CM

## 2017-10-19 DIAGNOSIS — Z7982 Long term (current) use of aspirin: Secondary | ICD-10-CM | POA: Diagnosis not present

## 2017-10-19 HISTORY — PX: INCISION AND DRAINAGE: SHX5863

## 2017-10-19 LAB — CBC
HEMATOCRIT: 29.8 % — AB (ref 39.0–52.0)
Hemoglobin: 9.7 g/dL — ABNORMAL LOW (ref 13.0–17.0)
MCH: 30.4 pg (ref 26.0–34.0)
MCHC: 32.6 g/dL (ref 30.0–36.0)
MCV: 93.4 fL (ref 78.0–100.0)
PLATELETS: 284 10*3/uL (ref 150–400)
RBC: 3.19 MIL/uL — ABNORMAL LOW (ref 4.22–5.81)
RDW: 17.9 % — AB (ref 11.5–15.5)
WBC: 6 10*3/uL (ref 4.0–10.5)

## 2017-10-19 LAB — BASIC METABOLIC PANEL
ANION GAP: 5 (ref 5–15)
BUN: 44 mg/dL — ABNORMAL HIGH (ref 8–23)
CALCIUM: 9.1 mg/dL (ref 8.9–10.3)
CHLORIDE: 111 mmol/L (ref 98–111)
CO2: 25 mmol/L (ref 22–32)
Creatinine, Ser: 2.54 mg/dL — ABNORMAL HIGH (ref 0.61–1.24)
GFR calc Af Amer: 28 mL/min — ABNORMAL LOW (ref >60)
GFR calc non Af Amer: 25 mL/min — ABNORMAL LOW (ref >60)
GLUCOSE: 93 mg/dL (ref 70–99)
POTASSIUM: 6.2 mmol/L — AB (ref 3.5–5.1)
Sodium: 141 mmol/L (ref 135–145)

## 2017-10-19 SURGERY — INCISION AND DRAINAGE
Anesthesia: General | Site: Knee | Laterality: Left

## 2017-10-19 MED ORDER — CEFAZOLIN SODIUM-DEXTROSE 2-4 GM/100ML-% IV SOLN
2.0000 g | INTRAVENOUS | Status: AC
  Administered 2017-10-19: 2 g via INTRAVENOUS
  Filled 2017-10-19: qty 100

## 2017-10-19 MED ORDER — BISACODYL 10 MG RE SUPP: 10.0000 mg | Freq: Every day | RECTAL | Status: DC | PRN

## 2017-10-19 MED ORDER — SODIUM CHLORIDE 0.9 % IR SOLN
Status: DC | PRN
  Administered 2017-10-19: 1000 mL

## 2017-10-19 MED ORDER — POLYETHYLENE GLYCOL 3350 17 G PO PACK
17.0000 g | PACK | Freq: Two times a day (BID) | ORAL | Status: DC | PRN
  Administered 2017-10-21: 17 g via ORAL
  Filled 2017-10-19: qty 1

## 2017-10-19 MED ORDER — HYDROCHLOROTHIAZIDE 25 MG PO TABS
25.0000 mg | ORAL_TABLET | Freq: Every day | ORAL | Status: DC
  Administered 2017-10-20 – 2017-10-21 (×2): 25 mg via ORAL
  Filled 2017-10-19 (×2): qty 1

## 2017-10-19 MED ORDER — FENTANYL CITRATE (PF) 100 MCG/2ML IJ SOLN
INTRAMUSCULAR | Status: AC
  Filled 2017-10-19: qty 2

## 2017-10-19 MED ORDER — DOCUSATE SODIUM 100 MG PO CAPS
100.0000 mg | ORAL_CAPSULE | Freq: Two times a day (BID) | ORAL | Status: DC
  Administered 2017-10-19 – 2017-10-21 (×4): 100 mg via ORAL
  Filled 2017-10-19 (×4): qty 1

## 2017-10-19 MED ORDER — BUDESONIDE 0.5 MG/2ML IN SUSP
0.5000 mg | Freq: Two times a day (BID) | RESPIRATORY_TRACT | Status: DC
  Administered 2017-10-20 – 2017-10-21 (×3): 0.5 mg via RESPIRATORY_TRACT
  Filled 2017-10-19 (×4): qty 2

## 2017-10-19 MED ORDER — ACETAMINOPHEN 500 MG PO TABS
500.0000 mg | ORAL_TABLET | Freq: Four times a day (QID) | ORAL | Status: AC
  Administered 2017-10-20: 500 mg via ORAL
  Filled 2017-10-19: qty 1

## 2017-10-19 MED ORDER — PHENYLEPHRINE 40 MCG/ML (10ML) SYRINGE FOR IV PUSH (FOR BLOOD PRESSURE SUPPORT)
PREFILLED_SYRINGE | INTRAVENOUS | Status: AC
  Filled 2017-10-19: qty 10

## 2017-10-19 MED ORDER — ONDANSETRON HCL 4 MG/2ML IJ SOLN
INTRAMUSCULAR | Status: AC
  Filled 2017-10-19: qty 2

## 2017-10-19 MED ORDER — ONDANSETRON HCL 4 MG/2ML IJ SOLN
INTRAMUSCULAR | Status: DC | PRN
  Administered 2017-10-19: 4 mg via INTRAVENOUS

## 2017-10-19 MED ORDER — PHENYLEPHRINE 40 MCG/ML (10ML) SYRINGE FOR IV PUSH (FOR BLOOD PRESSURE SUPPORT)
PREFILLED_SYRINGE | INTRAVENOUS | Status: DC | PRN
  Administered 2017-10-19: 80 ug via INTRAVENOUS
  Administered 2017-10-19: 100 ug via INTRAVENOUS
  Administered 2017-10-19 (×2): 80 ug via INTRAVENOUS
  Administered 2017-10-19: 100 ug via INTRAVENOUS
  Administered 2017-10-19: 40 ug via INTRAVENOUS

## 2017-10-19 MED ORDER — SODIUM CHLORIDE 0.9 % IV SOLN
INTRAVENOUS | Status: DC
  Administered 2017-10-19 – 2017-10-20 (×3): via INTRAVENOUS

## 2017-10-19 MED ORDER — PROPOFOL 10 MG/ML IV BOLUS
INTRAVENOUS | Status: AC
  Filled 2017-10-19: qty 20

## 2017-10-19 MED ORDER — ONDANSETRON HCL 4 MG PO TABS: 4.0000 mg | ORAL_TABLET | Freq: Four times a day (QID) | ORAL | Status: DC | PRN

## 2017-10-19 MED ORDER — ASPIRIN EC 81 MG PO TBEC
81.0000 mg | DELAYED_RELEASE_TABLET | Freq: Every day | ORAL | Status: DC
  Administered 2017-10-20 – 2017-10-21 (×2): 81 mg via ORAL
  Filled 2017-10-19 (×2): qty 1

## 2017-10-19 MED ORDER — TAMSULOSIN HCL 0.4 MG PO CAPS
0.8000 mg | ORAL_CAPSULE | Freq: Every day | ORAL | Status: DC
  Administered 2017-10-19 – 2017-10-20 (×2): 0.8 mg via ORAL
  Filled 2017-10-19 (×2): qty 2

## 2017-10-19 MED ORDER — EMTRICITABINE-TENOFOVIR AF 200-25 MG PO TABS
1.0000 | ORAL_TABLET | Freq: Every day | ORAL | Status: DC
  Administered 2017-10-20 – 2017-10-21 (×2): 1 via ORAL
  Filled 2017-10-19 (×2): qty 1

## 2017-10-19 MED ORDER — FAMOTIDINE 20 MG PO TABS
20.0000 mg | ORAL_TABLET | Freq: Every day | ORAL | Status: DC
  Administered 2017-10-19 – 2017-10-20 (×2): 20 mg via ORAL
  Filled 2017-10-19 (×3): qty 1

## 2017-10-19 MED ORDER — LIDOCAINE 2% (20 MG/ML) 5 ML SYRINGE
INTRAMUSCULAR | Status: AC
  Filled 2017-10-19: qty 5

## 2017-10-19 MED ORDER — FENTANYL CITRATE (PF) 100 MCG/2ML IJ SOLN
INTRAMUSCULAR | Status: DC | PRN
  Administered 2017-10-19 (×3): 25 ug via INTRAVENOUS

## 2017-10-19 MED ORDER — LIDOCAINE 2% (20 MG/ML) 5 ML SYRINGE
INTRAMUSCULAR | Status: DC | PRN
  Administered 2017-10-19: 180 mg via INTRAVENOUS
  Administered 2017-10-19: 50 mg via INTRAVENOUS

## 2017-10-19 MED ORDER — TRAMADOL HCL 50 MG PO TABS: 50.0000 mg | ORAL_TABLET | Freq: Four times a day (QID) | ORAL | Status: DC | PRN

## 2017-10-19 MED ORDER — ONDANSETRON HCL 4 MG/2ML IJ SOLN: 4.0000 mg | Freq: Four times a day (QID) | INTRAMUSCULAR | Status: DC | PRN

## 2017-10-19 MED ORDER — HYDROCODONE-ACETAMINOPHEN 5-325 MG PO TABS: 1.0000 | ORAL_TABLET | ORAL | Status: DC | PRN

## 2017-10-19 MED ORDER — MIDAZOLAM HCL 2 MG/2ML IJ SOLN
INTRAMUSCULAR | Status: AC
  Filled 2017-10-19: qty 2

## 2017-10-19 MED ORDER — ACETAMINOPHEN 325 MG PO TABS
325.0000 mg | ORAL_TABLET | Freq: Four times a day (QID) | ORAL | Status: DC | PRN
  Filled 2017-10-19: qty 1

## 2017-10-19 MED ORDER — METHOCARBAMOL 500 MG IVPB - SIMPLE MED
INTRAVENOUS | Status: AC
  Filled 2017-10-19: qty 50

## 2017-10-19 MED ORDER — VALACYCLOVIR HCL 500 MG PO TABS
500.0000 mg | ORAL_TABLET | Freq: Every day | ORAL | Status: DC
  Administered 2017-10-20 – 2017-10-21 (×2): 500 mg via ORAL
  Filled 2017-10-19 (×2): qty 1

## 2017-10-19 MED ORDER — ALBUTEROL SULFATE (2.5 MG/3ML) 0.083% IN NEBU: 2.5000 mg | INHALATION_SOLUTION | Freq: Four times a day (QID) | RESPIRATORY_TRACT | Status: DC | PRN

## 2017-10-19 MED ORDER — 0.9 % SODIUM CHLORIDE (POUR BTL) OPTIME
TOPICAL | Status: DC | PRN
  Administered 2017-10-19: 1000 mL

## 2017-10-19 MED ORDER — FERROUS SULFATE 325 (65 FE) MG PO TABS
325.0000 mg | ORAL_TABLET | Freq: Every day | ORAL | Status: DC
  Administered 2017-10-20: 325 mg via ORAL
  Filled 2017-10-19: qty 1

## 2017-10-19 MED ORDER — DEXAMETHASONE SODIUM PHOSPHATE 10 MG/ML IJ SOLN
INTRAMUSCULAR | Status: DC | PRN
  Administered 2017-10-19: 8 mg via INTRAVENOUS

## 2017-10-19 MED ORDER — METOCLOPRAMIDE HCL 5 MG/ML IJ SOLN: 5.0000 mg | Freq: Three times a day (TID) | INTRAMUSCULAR | Status: DC | PRN

## 2017-10-19 MED ORDER — MORPHINE SULFATE (PF) 2 MG/ML IV SOLN: 0.5000 mg | INTRAVENOUS | Status: DC | PRN

## 2017-10-19 MED ORDER — METOCLOPRAMIDE HCL 5 MG PO TABS: 5.0000 mg | ORAL_TABLET | Freq: Three times a day (TID) | ORAL | Status: DC | PRN

## 2017-10-19 MED ORDER — RALTEGRAVIR POTASSIUM 400 MG PO TABS
400.0000 mg | ORAL_TABLET | Freq: Two times a day (BID) | ORAL | Status: DC
  Administered 2017-10-19 – 2017-10-21 (×4): 400 mg via ORAL
  Filled 2017-10-19 (×4): qty 1

## 2017-10-19 MED ORDER — TRAMADOL HCL 50 MG PO TABS
50.0000 mg | ORAL_TABLET | Freq: Four times a day (QID) | ORAL | Status: DC | PRN
  Administered 2017-10-20: 50 mg via ORAL
  Filled 2017-10-19: qty 1

## 2017-10-19 MED ORDER — METHOCARBAMOL 500 MG PO TABS: 500.0000 mg | ORAL_TABLET | Freq: Four times a day (QID) | ORAL | Status: DC | PRN

## 2017-10-19 MED ORDER — EPHEDRINE SULFATE-NACL 50-0.9 MG/10ML-% IV SOSY
PREFILLED_SYRINGE | INTRAVENOUS | Status: DC | PRN
  Administered 2017-10-19: 5 mg via INTRAVENOUS

## 2017-10-19 MED ORDER — EPHEDRINE 5 MG/ML INJ
INTRAVENOUS | Status: AC
  Filled 2017-10-19: qty 10

## 2017-10-19 MED ORDER — GABAPENTIN 300 MG PO CAPS
300.0000 mg | ORAL_CAPSULE | Freq: Every day | ORAL | Status: DC
  Administered 2017-10-19 – 2017-10-20 (×2): 300 mg via ORAL
  Filled 2017-10-19 (×2): qty 1

## 2017-10-19 MED ORDER — CHLORHEXIDINE GLUCONATE 4 % EX LIQD: 60.0000 mL | Freq: Once | CUTANEOUS | Status: DC

## 2017-10-19 MED ORDER — FENTANYL CITRATE (PF) 100 MCG/2ML IJ SOLN: 25.0000 ug | INTRAMUSCULAR | Status: DC | PRN

## 2017-10-19 MED ORDER — CYCLOBENZAPRINE HCL 10 MG PO TABS
10.0000 mg | ORAL_TABLET | Freq: Every day | ORAL | Status: DC
  Administered 2017-10-19 – 2017-10-20 (×2): 10 mg via ORAL
  Filled 2017-10-19 (×2): qty 1

## 2017-10-19 MED ORDER — CEFAZOLIN SODIUM-DEXTROSE 1-4 GM/50ML-% IV SOLN
1.0000 g | Freq: Two times a day (BID) | INTRAVENOUS | Status: AC
  Administered 2017-10-20 (×2): 1 g via INTRAVENOUS
  Filled 2017-10-19 (×2): qty 50

## 2017-10-19 MED ORDER — METHOCARBAMOL 500 MG IVPB - SIMPLE MED
500.0000 mg | Freq: Four times a day (QID) | INTRAVENOUS | Status: DC | PRN
  Administered 2017-10-19: 500 mg via INTRAVENOUS
  Filled 2017-10-19: qty 50

## 2017-10-19 MED ORDER — LACTATED RINGERS IV SOLN
INTRAVENOUS | Status: DC
  Administered 2017-10-19: 16:00:00 via INTRAVENOUS

## 2017-10-19 MED ORDER — SULFAMETHOXAZOLE-TRIMETHOPRIM 400-80 MG PO TABS: 1.0000 | ORAL_TABLET | Freq: Every day | ORAL | Status: DC

## 2017-10-19 MED ORDER — POVIDONE-IODINE 10 % EX SWAB: 2.0000 "application " | Freq: Once | CUTANEOUS | Status: DC

## 2017-10-19 MED ORDER — DOCUSATE SODIUM 100 MG PO CAPS: 100.0000 mg | ORAL_CAPSULE | Freq: Two times a day (BID) | ORAL | Status: DC

## 2017-10-19 MED ORDER — ACETAMINOPHEN 10 MG/ML IV SOLN
1000.0000 mg | Freq: Four times a day (QID) | INTRAVENOUS | Status: DC
  Administered 2017-10-19: 1000 mg via INTRAVENOUS
  Filled 2017-10-19: qty 100

## 2017-10-19 SURGICAL SUPPLY — 50 items
BAG SPEC THK2 15X12 ZIP CLS (MISCELLANEOUS) ×1
BAG ZIPLOCK 12X15 (MISCELLANEOUS) ×3 IMPLANT
BANDAGE ACE 6X5 VEL STRL LF (GAUZE/BANDAGES/DRESSINGS) ×3 IMPLANT
BANDAGE ESMARK 6X9 LF (GAUZE/BANDAGES/DRESSINGS) ×1 IMPLANT
BNDG CMPR 9X6 STRL LF SNTH (GAUZE/BANDAGES/DRESSINGS) ×1
BNDG ESMARK 6X9 LF (GAUZE/BANDAGES/DRESSINGS) ×3
CLOSURE WOUND 1/2 X4 (GAUZE/BANDAGES/DRESSINGS)
COVER SURGICAL LIGHT HANDLE (MISCELLANEOUS) ×3 IMPLANT
CUFF TOURN SGL QUICK 34 (TOURNIQUET CUFF) ×3
CUFF TRNQT CYL 34X4X40X1 (TOURNIQUET CUFF) ×1 IMPLANT
DRAPE EXTREMITY T 121X128X90 (DRAPE) ×3 IMPLANT
DRAPE POUCH INSTRU U-SHP 10X18 (DRAPES) ×3 IMPLANT
DRAPE U-SHAPE 47X51 STRL (DRAPES) ×3 IMPLANT
DRSG ADAPTIC 3X8 NADH LF (GAUZE/BANDAGES/DRESSINGS) ×3 IMPLANT
DRSG PAD ABDOMINAL 8X10 ST (GAUZE/BANDAGES/DRESSINGS) ×3 IMPLANT
DURAPREP 26ML APPLICATOR (WOUND CARE) ×3 IMPLANT
ELECT REM PT RETURN 15FT ADLT (MISCELLANEOUS) ×3 IMPLANT
EVACUATOR 1/8 PVC DRAIN (DRAIN) ×1 IMPLANT
GAUZE SPONGE 4X4 12PLY STRL (GAUZE/BANDAGES/DRESSINGS) ×3 IMPLANT
GLOVE BIO SURGEON STRL SZ7.5 (GLOVE) ×3 IMPLANT
GLOVE BIO SURGEON STRL SZ8 (GLOVE) ×6 IMPLANT
GLOVE BIOGEL PI IND STRL 7.0 (GLOVE) ×1 IMPLANT
GLOVE BIOGEL PI IND STRL 8 (GLOVE) ×2 IMPLANT
GLOVE BIOGEL PI INDICATOR 7.0 (GLOVE) ×2
GLOVE BIOGEL PI INDICATOR 8 (GLOVE) ×4
GLOVE ECLIPSE 8.0 STRL XLNG CF (GLOVE) IMPLANT
GLOVE SURG SS PI 7.0 STRL IVOR (GLOVE) ×3 IMPLANT
GOWN SPEC L3 XXLG W/TWL (GOWN DISPOSABLE) ×3 IMPLANT
GOWN STRL REUS W/TWL LRG LVL3 (GOWN DISPOSABLE) ×2 IMPLANT
GOWN STRL REUS W/TWL XL LVL3 (GOWN DISPOSABLE) ×6 IMPLANT
HANDPIECE INTERPULSE COAX TIP (DISPOSABLE) ×3
IMMOBILIZER KNEE 20 (SOFTGOODS) ×5 IMPLANT
IMMOBILIZER KNEE 20 THIGH 36 (SOFTGOODS) IMPLANT
KIT BASIN OR (CUSTOM PROCEDURE TRAY) ×3 IMPLANT
MANIFOLD NEPTUNE II (INSTRUMENTS) ×3 IMPLANT
PACK TOTAL JOINT (CUSTOM PROCEDURE TRAY) ×3 IMPLANT
PAD ABD 8X10 STRL (GAUZE/BANDAGES/DRESSINGS) ×2 IMPLANT
PADDING CAST COTTON 6X4 STRL (CAST SUPPLIES) ×6 IMPLANT
POSITIONER SURGICAL ARM (MISCELLANEOUS) ×3 IMPLANT
SET HNDPC FAN SPRY TIP SCT (DISPOSABLE) ×1 IMPLANT
STAPLER VISISTAT 35W (STAPLE) ×3 IMPLANT
STRIP CLOSURE SKIN 1/2X4 (GAUZE/BANDAGES/DRESSINGS) IMPLANT
SUT MNCRL AB 4-0 PS2 18 (SUTURE) ×3 IMPLANT
SUT PDS AB 1 CT1 27 (SUTURE) ×2 IMPLANT
SUT VIC AB 2-0 CT1 27 (SUTURE) ×9
SUT VIC AB 2-0 CT1 TAPERPNT 27 (SUTURE) ×3 IMPLANT
SUT VLOC 180 0 24IN GS25 (SUTURE) ×3 IMPLANT
SWAB COLLECTION DEVICE MRSA (MISCELLANEOUS) ×1 IMPLANT
SWAB CULTURE ESWAB REG 1ML (MISCELLANEOUS) ×1 IMPLANT
TOWEL OR 17X26 10 PK STRL BLUE (TOWEL DISPOSABLE) ×3 IMPLANT

## 2017-10-19 NOTE — Anesthesia Procedure Notes (Signed)
Procedure Name: LMA Insertion Date/Time: 10/19/2017 4:52 PM Performed by: Anne Fu, CRNA Pre-anesthesia Checklist: Patient identified, Emergency Drugs available, Suction available, Patient being monitored and Timeout performed Patient Re-evaluated:Patient Re-evaluated prior to induction Oxygen Delivery Method: Circle system utilized Preoxygenation: Pre-oxygenation with 100% oxygen Induction Type: IV induction Ventilation: Mask ventilation without difficulty LMA: LMA inserted LMA Size: 4.0 Number of attempts: 1 Placement Confirmation: positive ETCO2 and breath sounds checked- equal and bilateral Tube secured with: Tape

## 2017-10-19 NOTE — Brief Op Note (Signed)
10/19/2017  5:26 PM  PATIENT:  Logan Levine  67 y.o. male  PRE-OPERATIVE DIAGNOSIS:  hematoma left knee  POST-OPERATIVE DIAGNOSIS:  hematoma left knee  PROCEDURE:  Procedure(s) with comments: INCISION AND DRAINAGE left knee hematoma (Left) - 43min  SURGEON:  Surgeon(s) and Role:    Gaynelle Arabian, MD - Primary  PHYSICIAN ASSISTANT:   ASSISTANTS: Theresa Duty, PA-C   ANESTHESIA:   general  EBL:  25 mL   DRAINS: (Medium) Hemovact drain(s) in the left knee with  Suction Open   LOCAL MEDICATIONS USED:  NONE  COUNTS:  YES  TOURNIQUET:   Total Tourniquet Time Documented: Thigh (Left) - 12 minutes Total: Thigh (Left) - 12 minutes   DICTATION: .Other Dictation: Dictation Number [id number]  PLAN OF CARE: Admit for overnight observation  PATIENT DISPOSITION:  PACU - hemodynamically stable.

## 2017-10-19 NOTE — Transfer of Care (Signed)
Immediate Anesthesia Transfer of Care Note  Patient: Logan Levine  Procedure(s) Performed: Procedure(s) with comments: INCISION AND DRAINAGE left knee hematoma (Left) - 36min  Patient Location: PACU  Anesthesia Type:General  Level of Consciousness:  sedated, patient cooperative and responds to stimulation  Airway & Oxygen Therapy:Patient Spontanous Breathing and Patient connected to face mask oxgen  Post-op Assessment:  Report given to PACU RN and Post -op Vital signs reviewed and stable  Post vital signs:  Reviewed and stable  Last Vitals:  Vitals:   10/19/17 1512  BP: 103/65  Pulse: 80  Resp: 18  Temp: 36.9 C  SpO2: 56%    Complications: No apparent anesthesia complications

## 2017-10-19 NOTE — Op Note (Signed)
NAME: Logan Levine, Logan Levine MEDICAL RECORD [id number] ACCOUNT [id number] DATE OF BIRTH:1950/10/17 FACILITY: [facility] LOCATION: [facility] PHYSICIAN:Aryssa Rosamond Zella Ball, MD  OPERATIVE REPORT  DATE OF PROCEDURE:  10/19/2017  PREOPERATIVE DIAGNOSIS:  Left knee hematoma.  POSTOPERATIVE DIAGNOSIS:  Left knee hematoma.  PROCEDURE:  Incision and drainage of left knee hematoma.  SURGEON:  Gaynelle Arabian, MD  ASSISTANT:  Revonda Standard, PA-C  ANESTHESIA:  General.  ESTIMATED BLOOD LOSS:  25 mL.  DRAINS:  Hemovac x1.  TOURNIQUET TIME:  Twelve minutes at 300 mmHg.  COMPLICATIONS:  None.  CONDITION:  Stable to recovery.  BRIEF CLINICAL NOTE:  The patient is a 67 year old male who had a total knee arthroplasty reimplantation approximately 4 weeks ago.  He developed significant subcutaneous swelling.  He was aspirated in the office 1 time, and then the swelling came back.   Attempted to aspirate last week, but it felt as though this hematoma had solidified.  He presents today for evacuation of the hematoma, incision and drainage.  DESCRIPTION OF PROCEDURE:  After successful administration of general anesthetic, a tourniquet was placed high on the left thigh.  The left lower extremity was prepped and draped in the usual sterile fashion.  Extremity was wrapped in Esmarch, tourniquet  inflated to 300 mmHg.  A midline incision was made with a 10 blade into the subcutaneous tissue.  I did not need to do any incision below the level of the skin.  There was a fair amount of hematoma present subcutaneously, which was evacuated.  The  quadriceps snip portion of his extensor mechanism repair had torn.  I was thus able to irrigate into the joint.  I irrigated with a total of 1 L of saline with pulsatile lavage, removing all of the hematoma contents.  I then closed the quadriceps snip  with interrupted #1 PDS suture.  I overran this with a 0 Stratafix suture.  We then released the tourniquet for a time of 12  minutes.  A Hemovac drain was placed in the subcutaneous tissue.  Subcu was closed with interrupted 2-0 Vicryl and skin closed  with staples.  The incision was clean and dried and a bulky sterile dressing applied.  He was then placed into a knee immobilizer, awakened and transported to recovery in stable condition.  LN/NUANCE  D:10/19/2017 T:10/19/2017 JOB:[id number]

## 2017-10-19 NOTE — Anesthesia Preprocedure Evaluation (Addendum)
Anesthesia Evaluation  Patient identified by MRN, date of birth, ID band Patient awake    Reviewed: Allergy & Precautions, H&P , NPO status , Patient's Chart, lab work & pertinent test results  Airway Mallampati: III  TM Distance: >3 FB Neck ROM: Full    Dental no notable dental hx. (+) Edentulous Upper, Edentulous Lower, Dental Advisory Given   Pulmonary COPD,  COPD inhaler, Current Smoker,    Pulmonary exam normal breath sounds clear to auscultation       Cardiovascular hypertension, Pt. on medications  Rhythm:Regular Rate:Normal     Neuro/Psych Dementia negative neurological ROS     GI/Hepatic GERD  Medicated and Controlled,(+) Cirrhosis       , Hepatitis -, C  Endo/Other  negative endocrine ROS  Renal/GU negative Renal ROS  negative genitourinary   Musculoskeletal  (+) Arthritis , Osteoarthritis,    Abdominal   Peds  Hematology  (+) anemia , HIV,   Anesthesia Other Findings   Reproductive/Obstetrics negative OB ROS                           Anesthesia Physical Anesthesia Plan  ASA: III  Anesthesia Plan: General   Post-op Pain Management:    Induction: Intravenous  PONV Risk Score and Plan: 2 and Ondansetron, Midazolam, Treatment may vary due to age or medical condition and Dexamethasone  Airway Management Planned: LMA  Additional Equipment:   Intra-op Plan:   Post-operative Plan: Extubation in OR  Informed Consent: I have reviewed the patients History and Physical, chart, labs and discussed the procedure including the risks, benefits and alternatives for the proposed anesthesia with the patient or authorized representative who has indicated his/her understanding and acceptance.   Dental advisory given  Plan Discussed with: CRNA  Anesthesia Plan Comments:         Anesthesia Quick Evaluation

## 2017-10-19 NOTE — Anesthesia Postprocedure Evaluation (Signed)
Anesthesia Post Note  Patient: Logan Levine  Procedure(s) Performed: INCISION AND DRAINAGE left knee hematoma (Left Knee)     Patient location during evaluation: PACU Anesthesia Type: General Level of consciousness: awake and alert Pain management: pain level controlled Vital Signs Assessment: post-procedure vital signs reviewed and stable Respiratory status: spontaneous breathing, nonlabored ventilation and respiratory function stable Cardiovascular status: blood pressure returned to baseline and stable Postop Assessment: no apparent nausea or vomiting Anesthetic complications: no    Last Vitals:  Vitals:   10/19/17 1815 10/19/17 1830  BP: 104/66 101/65  Pulse: 89 87  Resp: 10 10  Temp:  36.7 C  SpO2: 100% 97%    Last Pain:  Vitals:   10/19/17 1830  TempSrc:   PainSc: 0-No pain      LLE Sensation: Full sensation;No numbness;No pain;No tingling (10/19/17 1830)     L Sensory Level: S1-Sole of foot, small toes (10/19/17 1830)    Ariba Lehnen,W. EDMOND
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
## 2017-10-19 NOTE — Progress Notes (Signed)
PHARMACY NOTE:  ANTIMICROBIAL RENAL DOSAGE ADJUSTMENT  Current antimicrobial regimen includes a mismatch between antimicrobial dosage and estimated renal function.  As per policy approved by the Pharmacy & Therapeutics and Medical Executive Committees, the antimicrobial dosage will be adjusted accordingly.  Current antimicrobial dosage: Cefazolin 2g IV q6h x 3 doses post-operatively   Indication: surgical prophylaxis   Renal Function:  Estimated Creatinine Clearance: 29.6 mL/min (A) (by C-G formula based on SCr of 2.54 mg/dL (H)). []      On intermittent HD, scheduled: []      On CRRT    Antimicrobial dosage has been changed to:  Cefazolin 1g IV q12h x 2 doses post-operatively    Thank you for allowing pharmacy to be a part of this patient's care.  Luiz Ochoa, [facility] 10/19/2017 10:10 PM

## 2017-10-20 ENCOUNTER — Encounter (HOSPITAL_COMMUNITY): Payer: Self-pay | Admitting: Orthopedic Surgery

## 2017-10-20 DIAGNOSIS — F0390 Unspecified dementia without behavioral disturbance: Secondary | ICD-10-CM | POA: Diagnosis not present

## 2017-10-20 DIAGNOSIS — K219 Gastro-esophageal reflux disease without esophagitis: Secondary | ICD-10-CM | POA: Diagnosis not present

## 2017-10-20 DIAGNOSIS — S76112A Strain of left quadriceps muscle, fascia and tendon, initial encounter: Secondary | ICD-10-CM | POA: Diagnosis not present

## 2017-10-20 DIAGNOSIS — J449 Chronic obstructive pulmonary disease, unspecified: Secondary | ICD-10-CM | POA: Diagnosis not present

## 2017-10-20 DIAGNOSIS — I10 Essential (primary) hypertension: Secondary | ICD-10-CM | POA: Diagnosis not present

## 2017-10-20 DIAGNOSIS — M96840 Postprocedural hematoma of a musculoskeletal structure following a musculoskeletal system procedure: Secondary | ICD-10-CM | POA: Diagnosis not present

## 2017-10-20 LAB — BASIC METABOLIC PANEL
Anion gap: 7 (ref 5–15)
Anion gap: 9 (ref 5–15)
BUN: 37 mg/dL — AB (ref 8–23)
BUN: 34 mg/dL — AB (ref 8–23)
CALCIUM: 9.5 mg/dL (ref 8.9–10.3)
CHLORIDE: 109 mmol/L (ref 98–111)
CO2: 21 mmol/L — ABNORMAL LOW (ref 22–32)
CO2: 21 mmol/L — ABNORMAL LOW (ref 22–32)
CREATININE: 1.59 mg/dL — AB (ref 0.61–1.24)
CREATININE: 1.64 mg/dL — AB (ref 0.61–1.24)
Calcium: 9.4 mg/dL (ref 8.9–10.3)
Chloride: 111 mmol/L (ref 98–111)
GFR calc Af Amer: 50 mL/min — ABNORMAL LOW (ref >60)
GFR calc Af Amer: 48 mL/min — ABNORMAL LOW (ref >60)
GFR calc non Af Amer: 43 mL/min — ABNORMAL LOW (ref >60)
GFR calc non Af Amer: 42 mL/min — ABNORMAL LOW (ref >60)
Glucose, Bld: 133 mg/dL — ABNORMAL HIGH (ref 70–99)
Glucose, Bld: 118 mg/dL — ABNORMAL HIGH (ref 70–99)
Potassium: 6.4 mmol/L (ref 3.5–5.1)
Potassium: 5.7 mmol/L — ABNORMAL HIGH (ref 3.5–5.1)
Sodium: 139 mmol/L (ref 135–145)
Sodium: 139 mmol/L (ref 135–145)

## 2017-10-20 MED ORDER — ASPIRIN EC 81 MG PO TBEC: 81.0000 mg | DELAYED_RELEASE_TABLET | Freq: Every day | ORAL | 0 refills | Status: DC

## 2017-10-20 MED ORDER — LIP MEDEX EX OINT
TOPICAL_OINTMENT | CUTANEOUS | Status: AC
  Administered 2017-10-20: 11:00:00
  Filled 2017-10-20: qty 7

## 2017-10-20 MED ORDER — TRAMADOL HCL 50 MG PO TABS: 50.0000 mg | ORAL_TABLET | Freq: Four times a day (QID) | ORAL | 0 refills | Status: DC | PRN

## 2017-10-20 MED ORDER — SODIUM POLYSTYRENE SULFONATE 15 GM/60ML PO SUSP
15.0000 g | Freq: Once | ORAL | Status: AC
  Administered 2017-10-20: 15 g via ORAL
  Filled 2017-10-20: qty 60

## 2017-10-20 MED ORDER — METHOCARBAMOL 500 MG PO TABS: 500.0000 mg | ORAL_TABLET | Freq: Four times a day (QID) | ORAL | 0 refills | Status: DC | PRN

## 2017-10-20 NOTE — Progress Notes (Signed)
Awaiting [facility] to come pick up patient for discharge.
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
## 2017-10-20 NOTE — Evaluation (Addendum)
Physical Therapy Evaluation Patient Details Name: Logan Levine MRN: [id number] DOB: 29-Dec-1950 Today's Date: 10/20/2017   History of Present Illness  67 yo male admitted with traumatic hematoma L LE-S/P I&D 10/19/17. Hx of L TKA reimplantation 09/23/17, HIV, COPD, dementia  Clinical Impression  On eval, pt was Min guard assist for mobility. He walked ~25 feet with a RW and climbed 2 steps. Educated pt on knee precautions-no ROM L knee. Discussed d/c plan-he stated he is going home later today. He reported he has some assistance but not 24/7 care. All education completed.     Follow Up Recommendations Home health PT;Supervision/Assistance - 24 hour    Equipment Recommendations  None recommended by PT    Recommendations for Other Services       Precautions / Restrictions Precautions Precautions: Fall Precaution Comments: NO ROM L KNEE Required Braces or Orthoses: Knee Immobilizer - Left Knee Immobilizer - Left: On at all times Restrictions Weight Bearing Restrictions: No Other Position/Activity Restrictions: WBAT      Mobility  Bed Mobility Overal bed mobility: Needs Assistance Bed Mobility: Supine to Sit     Supine to sit: Supervision;HOB elevated     General bed mobility comments: Increased time. VCs safety.   Transfers Overall transfer level: Needs assistance Equipment used: Rolling walker (2 wheeled) Transfers: Sit to/from Stand Sit to Stand: Min guard         General transfer comment: Close guard for safety. VCs safety, technique, hand/LE placement  Ambulation/Gait Ambulation/Gait assistance: Min guard Gait Distance (Feet): 25 Feet Assistive device: Rolling walker (2 wheeled) Gait Pattern/deviations: Step-to pattern     General Gait Details: Close guard for safety. VCs safety, sequence. Slow gait speed. Pt requested limited distance  Stairs Stairs: Yes Stairs assistance: Min guard Stair Management: Two rails;Step to pattern;Forwards Number of  Stairs: 2 General stair comments: Up and overt portable steps. Close guard for safety. VCs safety, technique, sequence. Pt stated he will be able to brace on nearby wall. He stated he has been doing it that way and he will be just fine.  Wheelchair Mobility    Modified Rankin (Stroke Patients Only)       Balance Overall balance assessment: Needs assistance         Standing balance support: Bilateral upper extremity supported Standing balance-Leahy Scale: Fair                               Pertinent Vitals/Pain Pain Assessment: 0-10 Pain Score: 8  Pain Location: L knee Pain Descriptors / Indicators: Aching;Sore Pain Intervention(s): Limited activity within patient's tolerance;Repositioned;Ice applied;Monitored during session    Home Living Family/patient expects to be discharged to:: Private residence Living Arrangements: Alone   Type of Home: House Home Access: Stairs to enter Entrance Stairs-Rails: None Entrance Stairs-Number of Steps: 2 Home Layout: One level Home Equipment: Environmental consultant - 2 wheels;Wheelchair - manual      Prior Function Level of Independence: Independent with assistive device(s)               Hand Dominance        Extremity/Trunk Assessment   Upper Extremity Assessment Upper Extremity Assessment: Overall WFL for tasks assessed    Lower Extremity Assessment Lower Extremity Assessment: Generalized weakness LLE Deficits / Details: moves ankle well. Hip flex 2/5, hip abd/add 2/5. Able to weightbear.       Communication   Communication: Other (comment)(pt reports he can't read)  Cognition Arousal/Alertness: Awake/alert Behavior During Therapy: WFL for tasks assessed/performed Overall Cognitive Status: Within Functional Limits for tasks assessed                                        General Comments      Exercises     Assessment/Plan    PT Assessment Patient needs continued PT services  PT Problem  List Decreased strength;Decreased balance;Decreased mobility;Decreased range of motion;Decreased activity tolerance;Pain;Decreased knowledge of precautions;Decreased knowledge of use of DME       PT Treatment Interventions DME instruction;Gait training;Functional mobility training;Therapeutic activities;Balance training;Patient/family education;Therapeutic exercise;Stair training    PT Goals (Current goals can be found in the Care Plan section)  Acute Rehab PT Goals Patient Stated Goal: Regain independence PT Goal Formulation: With patient Time For Goal Achievement: 11/03/17 Potential to Achieve Goals: Good    Frequency Min 5X/week   Barriers to discharge        Co-evaluation               AM-PAC PT "6 Clicks" Daily Activity  Outcome Measure Difficulty turning over in bed (including adjusting bedclothes, sheets and blankets)?: A Little Difficulty moving from lying on back to sitting on the side of the bed? : A Little Difficulty sitting down on and standing up from a chair with arms (e.g., wheelchair, bedside commode, etc,.)?: A Little Help needed moving to and from a bed to chair (including a wheelchair)?: A Little Help needed walking in hospital room?: A Little Help needed climbing 3-5 steps with a railing? : A Little 6 Click Score: 18    End of Session Equipment Utilized During Treatment: Gait belt;Left knee immobilizer Activity Tolerance: Patient tolerated treatment well Patient left: in chair;with call bell/phone within reach   PT Visit Diagnosis: Muscle weakness (generalized) (M62.81);Difficulty in walking, not elsewhere classified (R26.2);Pain Pain - Right/Left: Left Pain - part of body: Knee    Time: 0950-1020 PT Time Calculation (min) (ACUTE ONLY): 30 min   Charges:   PT Evaluation $PT Eval Moderate Complexity: 1 Mod PT Treatments $Gait Training: 8-22 mins          Weston Anna, PT Acute Rehabilitation Services Pager: [phone] Office:  [phone]

## 2017-10-20 NOTE — Progress Notes (Signed)
At 1745, the pt was provided with prescriptions and discharge instructions. The pt reported that he is unable to read so I read the whole D/C packet and prescriptions to the pt. He stated that he has a lady friend at home that helps care for him and if he needed to refer pack to the D/C packet for any reason upon D/C home, his friend would be there to read it to him. Per the pt's request, I tried to call his friend three times, but was unable to get in touch with her. Pt is being d/c home via [facility] with a wheel chair. Pt reported that he understand his discharge plan of care and reported no further questions or concerns.

## 2017-10-20 NOTE — Care Management Note (Signed)
Case Management Note  Patient Details  Name: NASARIO CZERNIAK MRN: [id number] Date of Birth: February 15, 1950  Subjective/Objective:      Discharge planning, spoke with patient at bedside. Have chosen [facility] for [facility] PT, evaluate and treat.       Action/Plan: Contacted [facility] for referral. Has a RW and 3n1. [phone]         Expected Discharge Date:  10/20/17               Expected Discharge Plan:  [place]  In-House Referral:  NA  Discharge planning Services  CM Consult  Post Acute Care Choice:  Home Health Choice offered to:  Patient  DME Arranged:  N/A DME Agency:  NA  HH Arranged:  PT [place] Agency:  [facility] (formerly [employer]  Status of Service:  Completed, signed off  If discussed at [employer], dates discussed:    Additional Comments:  Guadalupe Maple, RN 10/20/2017, 9:58 AM

## 2017-10-20 NOTE — Progress Notes (Signed)
Called pt's "lady friend" Nicholaus Corolla @ [phone] to see if she would be able to come by and pick up pat's wheelchair.  Tried several times to contact, but no one answered and no voicemail associated with phone number. Reviewed PT note. Pt able to ambulate with walker with contact guard assist. Pt to have HHPT and 24hr supervision. Unable to confirm with pt that he has someone at home waiting for him.  Pt unable to be safely discharged at this time due to lack of home care assistance. Will notify [facility] and attending.

## 2017-10-20 NOTE — Progress Notes (Signed)
Charge RN spoke with PT[facility]. Uncertain of pick up time, but patient is still on the list for transport.  Pt updated.

## 2017-10-20 NOTE — Progress Notes (Signed)
At 0909, I was notified by lab of a critical lab vale for K+ 6.4. I notified Theresa Duty, PA of the critical lab value.

## 2017-10-20 NOTE — Progress Notes (Signed)
Physical Therapy Treatment Patient Details Name: ADEMOLA VERT MRN: [id number] DOB: 1950-06-25 Today's Date: 10/20/2017    History of Present Illness 67 yo male admitted with traumatic hematoma L LE-S/P I&D 10/19/17. Hx of L TKA reimplantation 09/23/17, HIV, COPD, dementia    PT Comments    2nd session since pt was still here. Unsure if d/c will be later today or tomorrow (medical reasons). No further questions from pt at this time.   Follow Up Recommendations  Home health PT;Supervision/Assistance - 24 hour     Equipment Recommendations  None recommended by PT    Recommendations for Other Services       Precautions / Restrictions Precautions Precautions: Fall Precaution Comments: NO ROM L KNEE Required Braces or Orthoses: Knee Immobilizer - Left Knee Immobilizer - Left: On at all times Restrictions Weight Bearing Restrictions: No Other Position/Activity Restrictions: WBAT    Mobility  Bed Mobility Overal bed mobility: Needs Assistance Bed Mobility: Sit to Supine      Sit to supine: Supervision   General bed mobility comments: Increased time. VCs safety. Pt hooked surgical leg with R foot.  Transfers Overall transfer level: Needs assistance Equipment used: Rolling walker (2 wheeled) Transfers: Sit to/from Stand Sit to Stand: Min assist         General transfer comment: Small amount of assist to steady on initial stand. Socks were sliding a bit on floor. VCs safety, technique, hand/LE placement.   Ambulation/Gait Ambulation/Gait assistance: Min guard Gait Distance (Feet): 20 Feet Assistive device: Rolling walker (2 wheeled) Gait Pattern/deviations: Step-to pattern     General Gait Details: Close guard for safety. VCs safety, sequence. Slow gait speed. Pt requested limited distance   Stairs  Wheelchair Mobility    Modified Rankin (Stroke Patients Only)       Balance Overall balance assessment: Needs assistance         Standing balance  support: Bilateral upper extremity supported Standing balance-Leahy Scale: Poor                              Cognition Arousal/Alertness: Awake/alert Behavior During Therapy: WFL for tasks assessed/performed Overall Cognitive Status: Within Functional Limits for tasks assessed                                        Exercises      General Comments        Pertinent Vitals/Pain Pain Assessment: 0-10 Pain Score: 7  Pain Location: L knee Pain Descriptors / Indicators: Aching;Sore Pain Intervention(s): Limited activity within patient's tolerance;Repositioned;Ice applied    Home Living                      Prior Function            PT Goals (current goals can now be found in the care plan section) Acute Rehab PT Goals Patient Stated Goal: Regain independence PT Goal Formulation: With patient Time For Goal Achievement: 11/03/17 Potential to Achieve Goals: Good Progress towards PT goals: Progressing toward goals    Frequency    Min 5X/week      PT Plan Current plan remains appropriate    Co-evaluation              AM-PAC PT "6 Clicks" Daily Activity  Outcome Measure  Difficulty turning over in bed (including  adjusting bedclothes, sheets and blankets)?: A Little Difficulty moving from lying on back to sitting on the side of the bed? : A Little Difficulty sitting down on and standing up from a chair with arms (e.g., wheelchair, bedside commode, etc,.)?: A Little Help needed moving to and from a bed to chair (including a wheelchair)?: A Little Help needed walking in hospital room?: A Little Help needed climbing 3-5 steps with a railing? : A Little 6 Click Score: 18    End of Session Equipment Utilized During Treatment: Gait belt;Left knee immobilizer Activity Tolerance: Patient tolerated treatment well Patient left: in bed;with call bell/phone within reach;with bed alarm set   PT Visit Diagnosis: Muscle weakness  (generalized) (M62.81);Difficulty in walking, not elsewhere classified (R26.2);Pain Pain - Right/Left: Left Pain - part of body: Knee     Time: 1358-1410 PT Time Calculation (min) (ACUTE ONLY): 12 min  Charges:  $Gait Training: 8-22 mins                         Weston Anna, PT Acute Rehabilitation Services Pager: [phone] Office: [phone]

## 2017-10-20 NOTE — Care Management Obs Status (Signed)
[place] NOTIFICATION   Patient Details  Name: Logan Levine MRN: [id number] Date of Birth: February 07, 1950   Medicare Observation Status Notification Given:  Yes    Guadalupe Maple, RN 10/20/2017, 9:33 AM

## 2017-10-20 NOTE — Progress Notes (Signed)
[facility]  Patient Status: Active (receiving services up to time of hospitalization)  [facility] is providing the following services: RN, PT, OT, MSW and HHA. He is enrolled in our [place].  If patient discharges after hours, please call [phone].   Edwinna Areola 10/20/2017, 12:36 PM

## 2017-10-20 NOTE — Progress Notes (Signed)
[facility] at bedside for patient transport. Pt states that he has all of his discharge paperwork and does not have any further questions at this time.

## 2017-10-20 NOTE — Progress Notes (Signed)
Pt refusing to leave due to [facility] being unable to transport his wheelchair.

## 2017-10-20 NOTE — Care Management Note (Signed)
Case Management Note  Patient Details  Name: Logan Levine MRN: [id number] Date of Birth: Jun 30, 1950  Subjective/Objective:     UPDATE: Patient is active with [facility] prior to admission, will need resumption of care orders.                Action/Plan: Awaiting final orders for [facility].   Expected Discharge Date:  10/20/17               Expected Discharge Plan:  [name]  In-House Referral:  NA  Discharge planning Services  CM Consult  Post Acute Care Choice:  Home Health Choice offered to:  Patient  DME Arranged:  N/A DME Agency:  NA  HH Arranged:  PT, RN, Disease Management, Social Work, OT, Nurse's Aide [place] Agency:  [place]  Status of Service:  Completed, signed off  If discussed at [employer], dates discussed:    Additional Comments:  Guadalupe Maple, RN 10/20/2017, 12:33 PM

## 2017-10-20 NOTE — Discharge Instructions (Addendum)
° °  POSTOPERATIVE INSTRUCTIONS:  BLOOD CLOT PREVENTION: Take one 81 mg Aspirin once a day for four weeks following surgery  SURGICAL DRESSING: You may change your dressing on Wednesday (10/21/2017). Apply a 4x4 gauze with paper tape to cover the incision. Change this everyday for 7-10 days.  SHOWERING: You may shower 3 days after surgery. Do not scrub incision. Pat dry with towel afterwards and apply new dressing.  RANGE OF MOTION: No range of motion of the left knee for 2 weeks following surgery. Keep the knee in full extension as discussed with PT.  CALL THE OFFICE: If you have any questions or concerns at [phone].

## 2017-10-21 DIAGNOSIS — K219 Gastro-esophageal reflux disease without esophagitis: Secondary | ICD-10-CM | POA: Diagnosis not present

## 2017-10-21 DIAGNOSIS — M96840 Postprocedural hematoma of a musculoskeletal structure following a musculoskeletal system procedure: Secondary | ICD-10-CM | POA: Diagnosis not present

## 2017-10-21 DIAGNOSIS — K7460 Unspecified cirrhosis of liver: Secondary | ICD-10-CM | POA: Diagnosis not present

## 2017-10-21 DIAGNOSIS — M1990 Unspecified osteoarthritis, unspecified site: Secondary | ICD-10-CM | POA: Diagnosis not present

## 2017-10-21 DIAGNOSIS — Z7982 Long term (current) use of aspirin: Secondary | ICD-10-CM | POA: Diagnosis not present

## 2017-10-21 DIAGNOSIS — F17210 Nicotine dependence, cigarettes, uncomplicated: Secondary | ICD-10-CM | POA: Diagnosis not present

## 2017-10-21 DIAGNOSIS — J449 Chronic obstructive pulmonary disease, unspecified: Secondary | ICD-10-CM | POA: Diagnosis not present

## 2017-10-21 DIAGNOSIS — S76112A Strain of left quadriceps muscle, fascia and tendon, initial encounter: Secondary | ICD-10-CM | POA: Diagnosis not present

## 2017-10-21 DIAGNOSIS — Z79891 Long term (current) use of opiate analgesic: Secondary | ICD-10-CM | POA: Diagnosis not present

## 2017-10-21 DIAGNOSIS — Z7951 Long term (current) use of inhaled steroids: Secondary | ICD-10-CM | POA: Diagnosis not present

## 2017-10-21 DIAGNOSIS — Z96652 Presence of left artificial knee joint: Secondary | ICD-10-CM | POA: Diagnosis not present

## 2017-10-21 DIAGNOSIS — M109 Gout, unspecified: Secondary | ICD-10-CM | POA: Diagnosis not present

## 2017-10-21 DIAGNOSIS — I10 Essential (primary) hypertension: Secondary | ICD-10-CM | POA: Diagnosis not present

## 2017-10-21 DIAGNOSIS — B20 Human immunodeficiency virus [HIV] disease: Secondary | ICD-10-CM | POA: Diagnosis not present

## 2017-10-21 DIAGNOSIS — B182 Chronic viral hepatitis C: Secondary | ICD-10-CM | POA: Diagnosis not present

## 2017-10-21 DIAGNOSIS — Z4733 Aftercare following explantation of knee joint prosthesis: Secondary | ICD-10-CM | POA: Diagnosis not present

## 2017-10-21 DIAGNOSIS — N400 Enlarged prostate without lower urinary tract symptoms: Secondary | ICD-10-CM | POA: Diagnosis not present

## 2017-10-21 DIAGNOSIS — F0390 Unspecified dementia without behavioral disturbance: Secondary | ICD-10-CM | POA: Diagnosis not present

## 2017-10-21 DIAGNOSIS — Z79899 Other long term (current) drug therapy: Secondary | ICD-10-CM | POA: Diagnosis not present

## 2017-10-21 DIAGNOSIS — Z8619 Personal history of other infectious and parasitic diseases: Secondary | ICD-10-CM | POA: Diagnosis not present

## 2017-10-21 MED ORDER — SODIUM POLYSTYRENE SULFONATE 15 GM/60ML PO SUSP
15.0000 g | Freq: Once | ORAL | Status: AC
  Administered 2017-10-21: 15 g via ORAL
  Filled 2017-10-21: qty 60
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
## 2017-10-21 NOTE — Progress Notes (Signed)
Spoke with patient again at bedside. He gave me another number to call for a ride home. Called Santiago Glad at [phone], she states she can provide transportation. Notified nurse. Patient now states his lady friend left him several months ago but now he has another lady that helps him. He says that Santiago Glad helps him with transportation and getting groceries, he says he sees his PCP every 3 months and already has an appointment with him. He declines SNF at this time, is active with [facility] for multiple disciplines. [phone]

## 2017-10-21 NOTE — Progress Notes (Signed)
Physical Therapy Treatment Patient Details Name: Logan Levine MRN: [id number] DOB: [birth year] Today's Date: 10/21/2017    History of Present Illness 67 yo male admitted with traumatic hematoma L LE-S/P I&D 10/19/17. Hx of L TKA reimplantation 09/23/17, HIV, COPD, dementia    PT Comments    Progressing with mobility. Reviewed "no L knee ROM" precaution and need for KI at all times.    Follow Up Recommendations  Home health PT;Supervision/Assistance - 24 hour     Equipment Recommendations  None recommended by PT    Recommendations for Other Services       Precautions / Restrictions Precautions Precautions: Fall Precaution Comments: NO ROM L KNEE Required Braces or Orthoses: Knee Immobilizer - Left Knee Immobilizer - Left: On at all times Restrictions Weight Bearing Restrictions: No LLE Weight Bearing: Weight bearing as tolerated    Mobility  Bed Mobility Overal bed mobility: Needs Assistance Bed Mobility: Supine to Sit;Sit to Supine     Supine to sit: Supervision Sit to supine: Supervision   General bed mobility comments: Increased time. VCs safety. Pt hooked surgical leg with R foot and/or used UEs to assist.   Transfers Overall transfer level: Needs assistance Equipment used: Rolling walker (2 wheeled) Transfers: Sit to/from Stand Sit to Stand: Min guard;From elevated surface         General transfer comment: Close guard for safety.   Ambulation/Gait Ambulation/Gait assistance: Min guard Gait Distance (Feet): 40 Feet Assistive device: Rolling walker (2 wheeled) Gait Pattern/deviations: Step-to pattern     General Gait Details: Close guard for safety. VCs safety, sequence. Slow gait speed. Note ER of L LE/foot.    Stairs             Wheelchair Mobility    Modified Rankin (Stroke Patients Only)       Balance Overall balance assessment: Needs assistance         Standing balance support: Bilateral upper extremity supported Standing  balance-Leahy Scale: Poor                              Cognition Arousal/Alertness: Awake/alert Behavior During Therapy: WFL for tasks assessed/performed Overall Cognitive Status: Impaired/Different from baseline Area of Impairment: Memory                     Memory: Decreased recall of precautions                Exercises      General Comments        Pertinent Vitals/Pain Pain Assessment: 0-10 Pain Score: 5  Pain Location: L knee Pain Descriptors / Indicators: Aching;Sore Pain Intervention(s): Monitored during session;Repositioned    Home Living                      Prior Function            PT Goals (current goals can now be found in the care plan section) Progress towards PT goals: Progressing toward goals    Frequency    Min 5X/week      PT Plan Current plan remains appropriate    Co-evaluation              AM-PAC PT "6 Clicks" Daily Activity  Outcome Measure  Difficulty turning over in bed (including adjusting bedclothes, sheets and blankets)?: A Little Difficulty moving from lying on back to sitting on the side of the bed? :  A Little Difficulty sitting down on and standing up from a chair with arms (e.g., wheelchair, bedside commode, etc,.)?: A Little Help needed moving to and from a bed to chair (including a wheelchair)?: A Little Help needed walking in hospital room?: A Little Help needed climbing 3-5 steps with a railing? : A Little 6 Click Score: 18    End of Session Equipment Utilized During Treatment: Gait belt;Left knee immobilizer Activity Tolerance: Patient tolerated treatment well Patient left: in bed;with call bell/phone within reach;with bed alarm set   PT Visit Diagnosis: Muscle weakness (generalized) (M62.81);Difficulty in walking, not elsewhere classified (R26.2);Pain Pain - Right/Left: Left Pain - part of body: Knee     Time: 1036-1050 PT Time Calculation (min) (ACUTE ONLY): 14  min  Charges:  $Gait Training: 8-22 mins                       Weston Anna, PT Acute Rehabilitation Services Pager: [phone] Office: [phone]

## 2017-10-21 NOTE — Progress Notes (Signed)
Pt reported having a friend who assist him at home. Pt stated that he will not be home alone and will have help. The casemanager spoke to the pt's friend on the phone to confirm that the friend would in fact be there with the pt to assist him at home and the friend confirmed. Pt went home via his friend's car and the wheel chair was transported with the pt. Prior to d/c, the pt reported no further questions or concerns regarding his discharge plan of care.

## 2017-10-23 DIAGNOSIS — K7460 Unspecified cirrhosis of liver: Secondary | ICD-10-CM | POA: Diagnosis not present

## 2017-10-23 DIAGNOSIS — B20 Human immunodeficiency virus [HIV] disease: Secondary | ICD-10-CM | POA: Diagnosis not present

## 2017-10-23 DIAGNOSIS — J449 Chronic obstructive pulmonary disease, unspecified: Secondary | ICD-10-CM | POA: Diagnosis not present

## 2017-10-23 DIAGNOSIS — Z4733 Aftercare following explantation of knee joint prosthesis: Secondary | ICD-10-CM | POA: Diagnosis not present

## 2017-10-23 DIAGNOSIS — F0390 Unspecified dementia without behavioral disturbance: Secondary | ICD-10-CM | POA: Diagnosis not present

## 2017-10-23 DIAGNOSIS — B182 Chronic viral hepatitis C: Secondary | ICD-10-CM | POA: Diagnosis not present

## 2017-10-26 ENCOUNTER — Other Ambulatory Visit: Payer: Self-pay

## 2017-10-26 DIAGNOSIS — M25562 Pain in left knee: Secondary | ICD-10-CM | POA: Diagnosis not present

## 2017-10-26 DIAGNOSIS — I10 Essential (primary) hypertension: Secondary | ICD-10-CM | POA: Diagnosis not present

## 2017-10-26 DIAGNOSIS — N400 Enlarged prostate without lower urinary tract symptoms: Secondary | ICD-10-CM | POA: Diagnosis not present

## 2017-10-26 DIAGNOSIS — J449 Chronic obstructive pulmonary disease, unspecified: Secondary | ICD-10-CM | POA: Diagnosis not present

## 2017-10-26 NOTE — Patient Outreach (Signed)
[facility] Care Management  10/26/2017  Logan Levine [birth year] [id number]  EMMI: general discharge red alert.  Referral date: 10/26/17 Referral reason: know who to call about changes in condition: no Insurance: Medicare Day # 4  Telephone call to patient regarding EMMI general discharge red alert. HIPAA verified.  Explained reason for call. Patient states he has a lady friend that handles all of his medical things for him. Patient states he has an appointment to see his primary doctor today.  He states he has transportation to his appointments.  States his lady friend will be taking him to his doctor appointment. Patient denies any pain in his left knee.  He states it is doing ok now.  Patient states he uses a wheelchair in the home for ambulation and uses a walker outside.  Patient states that he has services with [facility] and they have seen him since his discharged from the hospital. Patient states he has his medication and takes it as prescribed. He reports the pharmacy delivers his medication to him.   Patient denies any concerns or problems at this time.  RNCM advised patient to notify MD of any changes in condition prior to scheduled appointment. RNCM provided contact name and number: [phone] or main office number [phone] and 24 hour nurse advise line [phone] by mail as requested by patient.  RNCM verified patient aware of 911 services for urgent/ emergent needs. Discussed symptoms of heart attack and stroke with patient.     PLAN:  RNCm will close patient due to patient being assessed and having no further needs.  RNCM will mail patient [facility] care management brochure/ magnet RNCM will send closure notification to patients primary MD   Quinn Plowman RN,BSN,CCM [facility] Telephonic  [phone]

## 2017-10-26 NOTE — Discharge Summary (Signed)
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            Discharge Care Instructions  (From admission, onward)         Start     Ordered   10/20/17 0000  Weight bearing as tolerated  10/20/17 0739         Follow-up Information    Gaynelle Arabian, MD. Schedule an appointment as soon as possible for a visit on 11/03/2017.   Specialty:  Orthopedic Surgery Contact information: [address] [place] [address] [phone]        [facility] Follow up.   Specialty:  [facility]:  physical therapy Contact information: [address] [place] [address] [phone]           Signed: Theresa Duty, PA-C Orthopedic Surgery 10/26/2017, 10:27 AM

## 2017-10-27 DIAGNOSIS — Z4733 Aftercare following explantation of knee joint prosthesis: Secondary | ICD-10-CM | POA: Diagnosis not present

## 2017-10-27 DIAGNOSIS — B182 Chronic viral hepatitis C: Secondary | ICD-10-CM | POA: Diagnosis not present

## 2017-10-27 DIAGNOSIS — F0390 Unspecified dementia without behavioral disturbance: Secondary | ICD-10-CM | POA: Diagnosis not present

## 2017-10-27 DIAGNOSIS — B20 Human immunodeficiency virus [HIV] disease: Secondary | ICD-10-CM | POA: Diagnosis not present

## 2017-10-27 DIAGNOSIS — J449 Chronic obstructive pulmonary disease, unspecified: Secondary | ICD-10-CM | POA: Diagnosis not present

## 2017-10-27 DIAGNOSIS — K7460 Unspecified cirrhosis of liver: Secondary | ICD-10-CM | POA: Diagnosis not present

## 2017-10-28 DIAGNOSIS — Z4733 Aftercare following explantation of knee joint prosthesis: Secondary | ICD-10-CM | POA: Diagnosis not present

## 2017-10-28 DIAGNOSIS — F0390 Unspecified dementia without behavioral disturbance: Secondary | ICD-10-CM | POA: Diagnosis not present

## 2017-10-28 DIAGNOSIS — B20 Human immunodeficiency virus [HIV] disease: Secondary | ICD-10-CM | POA: Diagnosis not present

## 2017-10-28 DIAGNOSIS — K7460 Unspecified cirrhosis of liver: Secondary | ICD-10-CM | POA: Diagnosis not present

## 2017-10-28 DIAGNOSIS — J449 Chronic obstructive pulmonary disease, unspecified: Secondary | ICD-10-CM | POA: Diagnosis not present

## 2017-10-28 DIAGNOSIS — B182 Chronic viral hepatitis C: Secondary | ICD-10-CM | POA: Diagnosis not present

## 2017-10-30 DIAGNOSIS — Z4733 Aftercare following explantation of knee joint prosthesis: Secondary | ICD-10-CM | POA: Diagnosis not present

## 2017-10-30 DIAGNOSIS — B182 Chronic viral hepatitis C: Secondary | ICD-10-CM | POA: Diagnosis not present

## 2017-10-30 DIAGNOSIS — B20 Human immunodeficiency virus [HIV] disease: Secondary | ICD-10-CM | POA: Diagnosis not present

## 2017-10-30 DIAGNOSIS — K7460 Unspecified cirrhosis of liver: Secondary | ICD-10-CM | POA: Diagnosis not present

## 2017-10-30 DIAGNOSIS — J449 Chronic obstructive pulmonary disease, unspecified: Secondary | ICD-10-CM | POA: Diagnosis not present

## 2017-10-30 DIAGNOSIS — F0390 Unspecified dementia without behavioral disturbance: Secondary | ICD-10-CM | POA: Diagnosis not present

## 2017-11-03 DIAGNOSIS — Z96652 Presence of left artificial knee joint: Secondary | ICD-10-CM | POA: Diagnosis not present

## 2017-11-03 DIAGNOSIS — T8131XA Disruption of external operation (surgical) wound, not elsewhere classified, initial encounter: Secondary | ICD-10-CM | POA: Diagnosis not present

## 2017-11-04 DIAGNOSIS — B20 Human immunodeficiency virus [HIV] disease: Secondary | ICD-10-CM | POA: Diagnosis not present

## 2017-11-04 DIAGNOSIS — J449 Chronic obstructive pulmonary disease, unspecified: Secondary | ICD-10-CM | POA: Diagnosis not present

## 2017-11-04 DIAGNOSIS — Z4733 Aftercare following explantation of knee joint prosthesis: Secondary | ICD-10-CM | POA: Diagnosis not present

## 2017-11-04 DIAGNOSIS — F0390 Unspecified dementia without behavioral disturbance: Secondary | ICD-10-CM | POA: Diagnosis not present

## 2017-11-04 DIAGNOSIS — K7460 Unspecified cirrhosis of liver: Secondary | ICD-10-CM | POA: Diagnosis not present

## 2017-11-04 DIAGNOSIS — B182 Chronic viral hepatitis C: Secondary | ICD-10-CM | POA: Diagnosis not present

## 2017-11-06 DIAGNOSIS — B20 Human immunodeficiency virus [HIV] disease: Secondary | ICD-10-CM | POA: Diagnosis not present

## 2017-11-06 DIAGNOSIS — J449 Chronic obstructive pulmonary disease, unspecified: Secondary | ICD-10-CM | POA: Diagnosis not present

## 2017-11-06 DIAGNOSIS — B182 Chronic viral hepatitis C: Secondary | ICD-10-CM | POA: Diagnosis not present

## 2017-11-06 DIAGNOSIS — F0390 Unspecified dementia without behavioral disturbance: Secondary | ICD-10-CM | POA: Diagnosis not present

## 2017-11-06 DIAGNOSIS — Z4733 Aftercare following explantation of knee joint prosthesis: Secondary | ICD-10-CM | POA: Diagnosis not present

## 2017-11-06 DIAGNOSIS — K7460 Unspecified cirrhosis of liver: Secondary | ICD-10-CM | POA: Diagnosis not present

## 2017-11-12 DIAGNOSIS — B182 Chronic viral hepatitis C: Secondary | ICD-10-CM | POA: Diagnosis not present

## 2017-11-12 DIAGNOSIS — B20 Human immunodeficiency virus [HIV] disease: Secondary | ICD-10-CM | POA: Diagnosis not present

## 2017-11-12 DIAGNOSIS — F0390 Unspecified dementia without behavioral disturbance: Secondary | ICD-10-CM | POA: Diagnosis not present

## 2017-11-12 DIAGNOSIS — Z4733 Aftercare following explantation of knee joint prosthesis: Secondary | ICD-10-CM | POA: Diagnosis not present

## 2017-11-12 DIAGNOSIS — K7460 Unspecified cirrhosis of liver: Secondary | ICD-10-CM | POA: Diagnosis not present

## 2017-11-12 DIAGNOSIS — J449 Chronic obstructive pulmonary disease, unspecified: Secondary | ICD-10-CM | POA: Diagnosis not present

## 2017-11-13 DIAGNOSIS — B182 Chronic viral hepatitis C: Secondary | ICD-10-CM | POA: Diagnosis not present

## 2017-11-13 DIAGNOSIS — Z4733 Aftercare following explantation of knee joint prosthesis: Secondary | ICD-10-CM | POA: Diagnosis not present

## 2017-11-13 DIAGNOSIS — B20 Human immunodeficiency virus [HIV] disease: Secondary | ICD-10-CM | POA: Diagnosis not present

## 2017-11-13 DIAGNOSIS — K7460 Unspecified cirrhosis of liver: Secondary | ICD-10-CM | POA: Diagnosis not present

## 2017-11-13 DIAGNOSIS — J449 Chronic obstructive pulmonary disease, unspecified: Secondary | ICD-10-CM | POA: Diagnosis not present

## 2017-11-13 DIAGNOSIS — F0390 Unspecified dementia without behavioral disturbance: Secondary | ICD-10-CM | POA: Diagnosis not present

## 2017-11-18 DIAGNOSIS — Z4733 Aftercare following explantation of knee joint prosthesis: Secondary | ICD-10-CM | POA: Diagnosis not present

## 2017-11-18 DIAGNOSIS — B182 Chronic viral hepatitis C: Secondary | ICD-10-CM | POA: Diagnosis not present

## 2017-11-18 DIAGNOSIS — K7460 Unspecified cirrhosis of liver: Secondary | ICD-10-CM | POA: Diagnosis not present

## 2017-11-18 DIAGNOSIS — F0390 Unspecified dementia without behavioral disturbance: Secondary | ICD-10-CM | POA: Diagnosis not present

## 2017-11-18 DIAGNOSIS — J449 Chronic obstructive pulmonary disease, unspecified: Secondary | ICD-10-CM | POA: Diagnosis not present

## 2017-11-18 DIAGNOSIS — B20 Human immunodeficiency virus [HIV] disease: Secondary | ICD-10-CM | POA: Diagnosis not present

## 2017-11-25 DIAGNOSIS — J449 Chronic obstructive pulmonary disease, unspecified: Secondary | ICD-10-CM | POA: Diagnosis not present

## 2017-11-25 DIAGNOSIS — Z4733 Aftercare following explantation of knee joint prosthesis: Secondary | ICD-10-CM | POA: Diagnosis not present

## 2017-11-25 DIAGNOSIS — F0390 Unspecified dementia without behavioral disturbance: Secondary | ICD-10-CM | POA: Diagnosis not present

## 2017-11-25 DIAGNOSIS — B20 Human immunodeficiency virus [HIV] disease: Secondary | ICD-10-CM | POA: Diagnosis not present

## 2017-11-25 DIAGNOSIS — B182 Chronic viral hepatitis C: Secondary | ICD-10-CM | POA: Diagnosis not present

## 2017-11-25 DIAGNOSIS — K7460 Unspecified cirrhosis of liver: Secondary | ICD-10-CM | POA: Diagnosis not present

## 2017-11-30 DIAGNOSIS — F0390 Unspecified dementia without behavioral disturbance: Secondary | ICD-10-CM | POA: Diagnosis not present

## 2017-11-30 DIAGNOSIS — Z4733 Aftercare following explantation of knee joint prosthesis: Secondary | ICD-10-CM | POA: Diagnosis not present

## 2017-11-30 DIAGNOSIS — B20 Human immunodeficiency virus [HIV] disease: Secondary | ICD-10-CM | POA: Diagnosis not present

## 2017-11-30 DIAGNOSIS — Z23 Encounter for immunization: Secondary | ICD-10-CM | POA: Diagnosis not present

## 2017-11-30 DIAGNOSIS — K7460 Unspecified cirrhosis of liver: Secondary | ICD-10-CM | POA: Diagnosis not present

## 2017-11-30 DIAGNOSIS — J449 Chronic obstructive pulmonary disease, unspecified: Secondary | ICD-10-CM | POA: Diagnosis not present

## 2017-11-30 DIAGNOSIS — B182 Chronic viral hepatitis C: Secondary | ICD-10-CM | POA: Diagnosis not present

## 2017-12-11 DIAGNOSIS — F0390 Unspecified dementia without behavioral disturbance: Secondary | ICD-10-CM | POA: Diagnosis not present

## 2017-12-11 DIAGNOSIS — B20 Human immunodeficiency virus [HIV] disease: Secondary | ICD-10-CM | POA: Diagnosis not present

## 2017-12-11 DIAGNOSIS — K7460 Unspecified cirrhosis of liver: Secondary | ICD-10-CM | POA: Diagnosis not present

## 2017-12-11 DIAGNOSIS — J449 Chronic obstructive pulmonary disease, unspecified: Secondary | ICD-10-CM | POA: Diagnosis not present

## 2017-12-11 DIAGNOSIS — B182 Chronic viral hepatitis C: Secondary | ICD-10-CM | POA: Diagnosis not present

## 2017-12-11 DIAGNOSIS — Z4733 Aftercare following explantation of knee joint prosthesis: Secondary | ICD-10-CM | POA: Diagnosis not present

## 2017-12-15 DIAGNOSIS — Z471 Aftercare following joint replacement surgery: Secondary | ICD-10-CM | POA: Diagnosis not present

## 2017-12-15 DIAGNOSIS — Z96652 Presence of left artificial knee joint: Secondary | ICD-10-CM | POA: Diagnosis not present

## 2017-12-16 DIAGNOSIS — K7460 Unspecified cirrhosis of liver: Secondary | ICD-10-CM | POA: Diagnosis not present

## 2017-12-16 DIAGNOSIS — B20 Human immunodeficiency virus [HIV] disease: Secondary | ICD-10-CM | POA: Diagnosis not present

## 2017-12-16 DIAGNOSIS — J449 Chronic obstructive pulmonary disease, unspecified: Secondary | ICD-10-CM | POA: Diagnosis not present

## 2017-12-16 DIAGNOSIS — B182 Chronic viral hepatitis C: Secondary | ICD-10-CM | POA: Diagnosis not present

## 2017-12-16 DIAGNOSIS — Z4733 Aftercare following explantation of knee joint prosthesis: Secondary | ICD-10-CM | POA: Diagnosis not present

## 2017-12-16 DIAGNOSIS — F0390 Unspecified dementia without behavioral disturbance: Secondary | ICD-10-CM | POA: Diagnosis not present

## 2018-01-05 DIAGNOSIS — B20 Human immunodeficiency virus [HIV] disease: Secondary | ICD-10-CM | POA: Diagnosis not present

## 2018-01-25 DIAGNOSIS — N400 Enlarged prostate without lower urinary tract symptoms: Secondary | ICD-10-CM | POA: Diagnosis not present

## 2018-01-25 DIAGNOSIS — I10 Essential (primary) hypertension: Secondary | ICD-10-CM | POA: Diagnosis not present

## 2018-01-25 DIAGNOSIS — J449 Chronic obstructive pulmonary disease, unspecified: Secondary | ICD-10-CM | POA: Diagnosis not present

## 2018-02-05 DIAGNOSIS — R262 Difficulty in walking, not elsewhere classified: Secondary | ICD-10-CM | POA: Diagnosis not present

## 2018-02-05 DIAGNOSIS — M25562 Pain in left knee: Secondary | ICD-10-CM | POA: Diagnosis not present

## 2018-02-05 DIAGNOSIS — Z9889 Other specified postprocedural states: Secondary | ICD-10-CM | POA: Diagnosis not present

## 2018-02-09 DIAGNOSIS — N182 Chronic kidney disease, stage 2 (mild): Secondary | ICD-10-CM | POA: Diagnosis not present

## 2018-02-09 DIAGNOSIS — R801 Persistent proteinuria, unspecified: Secondary | ICD-10-CM | POA: Diagnosis not present

## 2018-02-09 DIAGNOSIS — I129 Hypertensive chronic kidney disease with stage 1 through stage 4 chronic kidney disease, or unspecified chronic kidney disease: Secondary | ICD-10-CM | POA: Diagnosis not present

## 2018-02-10 DIAGNOSIS — R262 Difficulty in walking, not elsewhere classified: Secondary | ICD-10-CM | POA: Diagnosis not present

## 2018-02-10 DIAGNOSIS — Z9889 Other specified postprocedural states: Secondary | ICD-10-CM | POA: Diagnosis not present

## 2018-02-10 DIAGNOSIS — M25562 Pain in left knee: Secondary | ICD-10-CM | POA: Diagnosis not present

## 2018-02-12 DIAGNOSIS — M25562 Pain in left knee: Secondary | ICD-10-CM | POA: Diagnosis not present

## 2018-02-12 DIAGNOSIS — R262 Difficulty in walking, not elsewhere classified: Secondary | ICD-10-CM | POA: Diagnosis not present

## 2018-02-12 DIAGNOSIS — Z9889 Other specified postprocedural states: Secondary | ICD-10-CM | POA: Diagnosis not present

## 2018-02-16 DIAGNOSIS — R262 Difficulty in walking, not elsewhere classified: Secondary | ICD-10-CM | POA: Diagnosis not present

## 2018-02-16 DIAGNOSIS — M25562 Pain in left knee: Secondary | ICD-10-CM | POA: Diagnosis not present

## 2018-02-16 DIAGNOSIS — Z9889 Other specified postprocedural states: Secondary | ICD-10-CM | POA: Diagnosis not present

## 2018-02-19 DIAGNOSIS — M25562 Pain in left knee: Secondary | ICD-10-CM | POA: Diagnosis not present

## 2018-02-19 DIAGNOSIS — Z9889 Other specified postprocedural states: Secondary | ICD-10-CM | POA: Diagnosis not present

## 2018-02-19 DIAGNOSIS — R262 Difficulty in walking, not elsewhere classified: Secondary | ICD-10-CM | POA: Diagnosis not present

## 2018-02-23 DIAGNOSIS — Z9889 Other specified postprocedural states: Secondary | ICD-10-CM | POA: Diagnosis not present

## 2018-02-23 DIAGNOSIS — R262 Difficulty in walking, not elsewhere classified: Secondary | ICD-10-CM | POA: Diagnosis not present

## 2018-02-23 DIAGNOSIS — M25562 Pain in left knee: Secondary | ICD-10-CM | POA: Diagnosis not present

## 2018-02-26 DIAGNOSIS — Z9889 Other specified postprocedural states: Secondary | ICD-10-CM | POA: Diagnosis not present

## 2018-02-26 DIAGNOSIS — M25562 Pain in left knee: Secondary | ICD-10-CM | POA: Diagnosis not present

## 2018-02-26 DIAGNOSIS — R262 Difficulty in walking, not elsewhere classified: Secondary | ICD-10-CM | POA: Diagnosis not present

## 2018-03-02 DIAGNOSIS — M25562 Pain in left knee: Secondary | ICD-10-CM | POA: Diagnosis not present

## 2018-03-02 DIAGNOSIS — Z9889 Other specified postprocedural states: Secondary | ICD-10-CM | POA: Diagnosis not present

## 2018-03-02 DIAGNOSIS — R262 Difficulty in walking, not elsewhere classified: Secondary | ICD-10-CM | POA: Diagnosis not present

## 2018-03-05 DIAGNOSIS — Z9889 Other specified postprocedural states: Secondary | ICD-10-CM | POA: Diagnosis not present

## 2018-03-05 DIAGNOSIS — M25562 Pain in left knee: Secondary | ICD-10-CM | POA: Diagnosis not present

## 2018-03-05 DIAGNOSIS — R262 Difficulty in walking, not elsewhere classified: Secondary | ICD-10-CM | POA: Diagnosis not present

## 2018-03-09 DIAGNOSIS — Z9889 Other specified postprocedural states: Secondary | ICD-10-CM | POA: Diagnosis not present

## 2018-03-09 DIAGNOSIS — M25562 Pain in left knee: Secondary | ICD-10-CM | POA: Diagnosis not present

## 2018-03-09 DIAGNOSIS — R262 Difficulty in walking, not elsewhere classified: Secondary | ICD-10-CM | POA: Diagnosis not present

## 2018-03-09 DIAGNOSIS — M25561 Pain in right knee: Secondary | ICD-10-CM | POA: Diagnosis not present

## 2018-03-12 DIAGNOSIS — Z9889 Other specified postprocedural states: Secondary | ICD-10-CM | POA: Diagnosis not present

## 2018-03-12 DIAGNOSIS — M25562 Pain in left knee: Secondary | ICD-10-CM | POA: Diagnosis not present

## 2018-03-12 DIAGNOSIS — M25561 Pain in right knee: Secondary | ICD-10-CM | POA: Diagnosis not present

## 2018-03-12 DIAGNOSIS — R262 Difficulty in walking, not elsewhere classified: Secondary | ICD-10-CM | POA: Diagnosis not present

## 2018-03-16 DIAGNOSIS — Z9889 Other specified postprocedural states: Secondary | ICD-10-CM | POA: Diagnosis not present

## 2018-03-16 DIAGNOSIS — M25562 Pain in left knee: Secondary | ICD-10-CM | POA: Diagnosis not present

## 2018-03-16 DIAGNOSIS — M25561 Pain in right knee: Secondary | ICD-10-CM | POA: Diagnosis not present

## 2018-03-16 DIAGNOSIS — R262 Difficulty in walking, not elsewhere classified: Secondary | ICD-10-CM | POA: Diagnosis not present

## 2018-03-18 DIAGNOSIS — M25561 Pain in right knee: Secondary | ICD-10-CM | POA: Diagnosis not present

## 2018-03-18 DIAGNOSIS — Z9889 Other specified postprocedural states: Secondary | ICD-10-CM | POA: Diagnosis not present

## 2018-03-18 DIAGNOSIS — M25562 Pain in left knee: Secondary | ICD-10-CM | POA: Diagnosis not present

## 2018-03-18 DIAGNOSIS — R262 Difficulty in walking, not elsewhere classified: Secondary | ICD-10-CM | POA: Diagnosis not present

## 2018-03-23 DIAGNOSIS — M25562 Pain in left knee: Secondary | ICD-10-CM | POA: Diagnosis not present

## 2018-03-23 DIAGNOSIS — Z9889 Other specified postprocedural states: Secondary | ICD-10-CM | POA: Diagnosis not present

## 2018-03-23 DIAGNOSIS — R262 Difficulty in walking, not elsewhere classified: Secondary | ICD-10-CM | POA: Diagnosis not present

## 2018-03-23 DIAGNOSIS — M25561 Pain in right knee: Secondary | ICD-10-CM | POA: Diagnosis not present

## 2018-03-26 DIAGNOSIS — M25562 Pain in left knee: Secondary | ICD-10-CM | POA: Diagnosis not present

## 2018-03-26 DIAGNOSIS — M25561 Pain in right knee: Secondary | ICD-10-CM | POA: Diagnosis not present

## 2018-03-26 DIAGNOSIS — R262 Difficulty in walking, not elsewhere classified: Secondary | ICD-10-CM | POA: Diagnosis not present

## 2018-03-26 DIAGNOSIS — Z9889 Other specified postprocedural states: Secondary | ICD-10-CM | POA: Diagnosis not present

## 2018-03-30 DIAGNOSIS — Z9889 Other specified postprocedural states: Secondary | ICD-10-CM | POA: Diagnosis not present

## 2018-03-30 DIAGNOSIS — M25562 Pain in left knee: Secondary | ICD-10-CM | POA: Diagnosis not present

## 2018-03-30 DIAGNOSIS — R262 Difficulty in walking, not elsewhere classified: Secondary | ICD-10-CM | POA: Diagnosis not present

## 2018-03-30 DIAGNOSIS — M25561 Pain in right knee: Secondary | ICD-10-CM | POA: Diagnosis not present

## 2018-04-08 DIAGNOSIS — M25562 Pain in left knee: Secondary | ICD-10-CM | POA: Diagnosis not present

## 2018-04-08 DIAGNOSIS — M1711 Unilateral primary osteoarthritis, right knee: Secondary | ICD-10-CM | POA: Diagnosis not present

## 2018-04-26 DIAGNOSIS — J449 Chronic obstructive pulmonary disease, unspecified: Secondary | ICD-10-CM | POA: Diagnosis not present

## 2018-04-26 DIAGNOSIS — N400 Enlarged prostate without lower urinary tract symptoms: Secondary | ICD-10-CM | POA: Diagnosis not present

## 2018-04-26 DIAGNOSIS — I10 Essential (primary) hypertension: Secondary | ICD-10-CM | POA: Diagnosis not present

## 2018-07-06 DIAGNOSIS — M25562 Pain in left knee: Secondary | ICD-10-CM | POA: Diagnosis not present

## 2018-07-06 DIAGNOSIS — G8929 Other chronic pain: Secondary | ICD-10-CM | POA: Diagnosis not present

## 2018-07-06 DIAGNOSIS — B20 Human immunodeficiency virus [HIV] disease: Secondary | ICD-10-CM | POA: Diagnosis not present

## 2018-07-06 DIAGNOSIS — B182 Chronic viral hepatitis C: Secondary | ICD-10-CM | POA: Diagnosis not present

## 2018-07-08 DIAGNOSIS — Z96652 Presence of left artificial knee joint: Secondary | ICD-10-CM | POA: Diagnosis not present

## 2018-07-08 DIAGNOSIS — M1711 Unilateral primary osteoarthritis, right knee: Secondary | ICD-10-CM | POA: Diagnosis not present

## 2018-07-08 DIAGNOSIS — Z471 Aftercare following joint replacement surgery: Secondary | ICD-10-CM | POA: Diagnosis not present

## 2018-07-29 DIAGNOSIS — Z125 Encounter for screening for malignant neoplasm of prostate: Secondary | ICD-10-CM | POA: Diagnosis not present

## 2018-07-29 DIAGNOSIS — K469 Unspecified abdominal hernia without obstruction or gangrene: Secondary | ICD-10-CM | POA: Diagnosis not present

## 2018-07-29 DIAGNOSIS — J449 Chronic obstructive pulmonary disease, unspecified: Secondary | ICD-10-CM | POA: Diagnosis not present

## 2018-07-29 DIAGNOSIS — N400 Enlarged prostate without lower urinary tract symptoms: Secondary | ICD-10-CM | POA: Diagnosis not present

## 2018-07-29 DIAGNOSIS — I10 Essential (primary) hypertension: Secondary | ICD-10-CM | POA: Diagnosis not present

## 2018-07-29 DIAGNOSIS — Z1389 Encounter for screening for other disorder: Secondary | ICD-10-CM | POA: Diagnosis not present

## 2018-07-29 DIAGNOSIS — Z0000 Encounter for general adult medical examination without abnormal findings: Secondary | ICD-10-CM | POA: Diagnosis not present

## 2018-08-02 DIAGNOSIS — D3501 Benign neoplasm of right adrenal gland: Secondary | ICD-10-CM | POA: Diagnosis not present

## 2018-08-02 DIAGNOSIS — B1920 Unspecified viral hepatitis C without hepatic coma: Secondary | ICD-10-CM | POA: Diagnosis present

## 2018-08-02 DIAGNOSIS — Z7982 Long term (current) use of aspirin: Secondary | ICD-10-CM | POA: Diagnosis not present

## 2018-08-02 DIAGNOSIS — Z7951 Long term (current) use of inhaled steroids: Secondary | ICD-10-CM | POA: Diagnosis not present

## 2018-08-02 DIAGNOSIS — F17200 Nicotine dependence, unspecified, uncomplicated: Secondary | ICD-10-CM | POA: Diagnosis present

## 2018-08-02 DIAGNOSIS — J449 Chronic obstructive pulmonary disease, unspecified: Secondary | ICD-10-CM | POA: Diagnosis present

## 2018-08-02 DIAGNOSIS — N400 Enlarged prostate without lower urinary tract symptoms: Secondary | ICD-10-CM | POA: Diagnosis present

## 2018-08-02 DIAGNOSIS — M7989 Other specified soft tissue disorders: Secondary | ICD-10-CM | POA: Diagnosis not present

## 2018-08-02 DIAGNOSIS — E785 Hyperlipidemia, unspecified: Secondary | ICD-10-CM | POA: Diagnosis present

## 2018-08-02 DIAGNOSIS — K469 Unspecified abdominal hernia without obstruction or gangrene: Secondary | ICD-10-CM | POA: Diagnosis not present

## 2018-08-02 DIAGNOSIS — K436 Other and unspecified ventral hernia with obstruction, without gangrene: Secondary | ICD-10-CM | POA: Diagnosis not present

## 2018-08-02 DIAGNOSIS — K7460 Unspecified cirrhosis of liver: Secondary | ICD-10-CM | POA: Diagnosis present

## 2018-08-02 DIAGNOSIS — Z5331 Laparoscopic surgical procedure converted to open procedure: Secondary | ICD-10-CM | POA: Diagnosis not present

## 2018-08-02 DIAGNOSIS — K660 Peritoneal adhesions (postprocedural) (postinfection): Secondary | ICD-10-CM | POA: Diagnosis not present

## 2018-08-02 DIAGNOSIS — I10 Essential (primary) hypertension: Secondary | ICD-10-CM | POA: Diagnosis present

## 2018-08-02 DIAGNOSIS — K432 Incisional hernia without obstruction or gangrene: Secondary | ICD-10-CM | POA: Diagnosis present

## 2018-08-02 DIAGNOSIS — R160 Hepatomegaly, not elsewhere classified: Secondary | ICD-10-CM | POA: Diagnosis present

## 2018-08-02 DIAGNOSIS — E871 Hypo-osmolality and hyponatremia: Secondary | ICD-10-CM | POA: Diagnosis not present

## 2018-08-02 DIAGNOSIS — K5730 Diverticulosis of large intestine without perforation or abscess without bleeding: Secondary | ICD-10-CM | POA: Diagnosis not present

## 2018-08-02 DIAGNOSIS — K439 Ventral hernia without obstruction or gangrene: Secondary | ICD-10-CM | POA: Diagnosis not present

## 2018-08-02 DIAGNOSIS — Z21 Asymptomatic human immunodeficiency virus [HIV] infection status: Secondary | ICD-10-CM | POA: Diagnosis present

## 2018-08-02 DIAGNOSIS — R188 Other ascites: Secondary | ICD-10-CM | POA: Diagnosis not present

## 2018-08-25 DIAGNOSIS — K439 Ventral hernia without obstruction or gangrene: Secondary | ICD-10-CM | POA: Diagnosis not present

## 2018-08-25 DIAGNOSIS — M4807 Spinal stenosis, lumbosacral region: Secondary | ICD-10-CM | POA: Diagnosis not present

## 2018-09-10 DIAGNOSIS — Z96652 Presence of left artificial knee joint: Secondary | ICD-10-CM | POA: Diagnosis not present

## 2018-10-28 DIAGNOSIS — Z23 Encounter for immunization: Secondary | ICD-10-CM | POA: Diagnosis not present

## 2018-11-03 DIAGNOSIS — M25562 Pain in left knee: Secondary | ICD-10-CM | POA: Diagnosis not present

## 2018-11-03 DIAGNOSIS — I10 Essential (primary) hypertension: Secondary | ICD-10-CM | POA: Diagnosis not present

## 2018-11-03 DIAGNOSIS — J449 Chronic obstructive pulmonary disease, unspecified: Secondary | ICD-10-CM | POA: Diagnosis not present

## 2018-11-03 DIAGNOSIS — K210 Gastro-esophageal reflux disease with esophagitis: Secondary | ICD-10-CM | POA: Diagnosis not present

## 2018-12-17 DIAGNOSIS — M1711 Unilateral primary osteoarthritis, right knee: Secondary | ICD-10-CM | POA: Diagnosis not present

## 2018-12-17 DIAGNOSIS — Z471 Aftercare following joint replacement surgery: Secondary | ICD-10-CM | POA: Diagnosis not present

## 2018-12-17 DIAGNOSIS — Z96652 Presence of left artificial knee joint: Secondary | ICD-10-CM | POA: Diagnosis not present

## 2019-01-11 DIAGNOSIS — B20 Human immunodeficiency virus [HIV] disease: Secondary | ICD-10-CM | POA: Diagnosis not present

## 2019-01-11 DIAGNOSIS — B182 Chronic viral hepatitis C: Secondary | ICD-10-CM | POA: Diagnosis not present

## 2019-02-22 DIAGNOSIS — K2100 Gastro-esophageal reflux disease with esophagitis, without bleeding: Secondary | ICD-10-CM | POA: Diagnosis not present

## 2019-02-22 DIAGNOSIS — M25562 Pain in left knee: Secondary | ICD-10-CM | POA: Diagnosis not present

## 2019-02-22 DIAGNOSIS — I10 Essential (primary) hypertension: Secondary | ICD-10-CM | POA: Diagnosis not present

## 2019-02-22 DIAGNOSIS — J449 Chronic obstructive pulmonary disease, unspecified: Secondary | ICD-10-CM | POA: Diagnosis not present

## 2019-03-17 DIAGNOSIS — M1711 Unilateral primary osteoarthritis, right knee: Secondary | ICD-10-CM | POA: Diagnosis not present

## 2019-03-17 DIAGNOSIS — Z96652 Presence of left artificial knee joint: Secondary | ICD-10-CM | POA: Diagnosis not present

## 2019-05-25 DIAGNOSIS — I10 Essential (primary) hypertension: Secondary | ICD-10-CM | POA: Diagnosis not present

## 2019-05-25 DIAGNOSIS — M25562 Pain in left knee: Secondary | ICD-10-CM | POA: Diagnosis not present

## 2019-05-25 DIAGNOSIS — J449 Chronic obstructive pulmonary disease, unspecified: Secondary | ICD-10-CM | POA: Diagnosis not present

## 2019-05-25 DIAGNOSIS — K2100 Gastro-esophageal reflux disease with esophagitis, without bleeding: Secondary | ICD-10-CM | POA: Diagnosis not present

## 2019-06-24 DIAGNOSIS — M1711 Unilateral primary osteoarthritis, right knee: Secondary | ICD-10-CM | POA: Diagnosis not present

## 2019-06-24 DIAGNOSIS — Z96652 Presence of left artificial knee joint: Secondary | ICD-10-CM | POA: Diagnosis not present

## 2019-06-24 DIAGNOSIS — M24662 Ankylosis, left knee: Secondary | ICD-10-CM | POA: Diagnosis not present

## 2019-06-29 DIAGNOSIS — M25562 Pain in left knee: Secondary | ICD-10-CM | POA: Diagnosis not present

## 2019-06-29 DIAGNOSIS — R2689 Other abnormalities of gait and mobility: Secondary | ICD-10-CM | POA: Diagnosis not present

## 2019-06-29 DIAGNOSIS — M24662 Ankylosis, left knee: Secondary | ICD-10-CM | POA: Diagnosis not present

## 2019-06-29 DIAGNOSIS — M25662 Stiffness of left knee, not elsewhere classified: Secondary | ICD-10-CM | POA: Diagnosis not present

## 2019-07-01 DIAGNOSIS — M25662 Stiffness of left knee, not elsewhere classified: Secondary | ICD-10-CM | POA: Diagnosis not present

## 2019-07-01 DIAGNOSIS — M25562 Pain in left knee: Secondary | ICD-10-CM | POA: Diagnosis not present

## 2019-07-01 DIAGNOSIS — R2689 Other abnormalities of gait and mobility: Secondary | ICD-10-CM | POA: Diagnosis not present

## 2019-07-01 DIAGNOSIS — M24662 Ankylosis, left knee: Secondary | ICD-10-CM | POA: Diagnosis not present

## 2019-07-07 DIAGNOSIS — M25562 Pain in left knee: Secondary | ICD-10-CM | POA: Diagnosis not present

## 2019-07-07 DIAGNOSIS — M24662 Ankylosis, left knee: Secondary | ICD-10-CM | POA: Diagnosis not present

## 2019-07-07 DIAGNOSIS — M25662 Stiffness of left knee, not elsewhere classified: Secondary | ICD-10-CM | POA: Diagnosis not present

## 2019-07-07 DIAGNOSIS — R2689 Other abnormalities of gait and mobility: Secondary | ICD-10-CM | POA: Diagnosis not present

## 2019-07-08 DIAGNOSIS — M25662 Stiffness of left knee, not elsewhere classified: Secondary | ICD-10-CM | POA: Diagnosis not present

## 2019-07-08 DIAGNOSIS — R2689 Other abnormalities of gait and mobility: Secondary | ICD-10-CM | POA: Diagnosis not present

## 2019-07-08 DIAGNOSIS — M25562 Pain in left knee: Secondary | ICD-10-CM | POA: Diagnosis not present

## 2019-07-08 DIAGNOSIS — M24662 Ankylosis, left knee: Secondary | ICD-10-CM | POA: Diagnosis not present

## 2019-07-11 DIAGNOSIS — M25562 Pain in left knee: Secondary | ICD-10-CM | POA: Diagnosis not present

## 2019-07-11 DIAGNOSIS — M24662 Ankylosis, left knee: Secondary | ICD-10-CM | POA: Diagnosis not present

## 2019-07-11 DIAGNOSIS — R2689 Other abnormalities of gait and mobility: Secondary | ICD-10-CM | POA: Diagnosis not present

## 2019-07-11 DIAGNOSIS — M25662 Stiffness of left knee, not elsewhere classified: Secondary | ICD-10-CM | POA: Diagnosis not present

## 2019-07-12 DIAGNOSIS — B182 Chronic viral hepatitis C: Secondary | ICD-10-CM | POA: Diagnosis not present

## 2019-07-12 DIAGNOSIS — B20 Human immunodeficiency virus [HIV] disease: Secondary | ICD-10-CM | POA: Diagnosis not present

## 2019-07-13 DIAGNOSIS — R2689 Other abnormalities of gait and mobility: Secondary | ICD-10-CM | POA: Diagnosis not present

## 2019-07-13 DIAGNOSIS — M25662 Stiffness of left knee, not elsewhere classified: Secondary | ICD-10-CM | POA: Diagnosis not present

## 2019-07-13 DIAGNOSIS — M25562 Pain in left knee: Secondary | ICD-10-CM | POA: Diagnosis not present

## 2019-07-13 DIAGNOSIS — M24662 Ankylosis, left knee: Secondary | ICD-10-CM | POA: Diagnosis not present

## 2019-07-13 IMAGING — US US ABDOMEN LIMITED
1 series · 14 of 25 positions shown · non-contrast
Comparison: 07/08/2012

CLINICAL DATA: Hepatitis-C follow-up

EXAM:
ULTRASOUND ABDOMEN LIMITED RIGHT UPPER QUADRANT

[Series 1: us abdomen limited · 0.19mm/px · 14 of 70 slices shown]
[im 1/70]
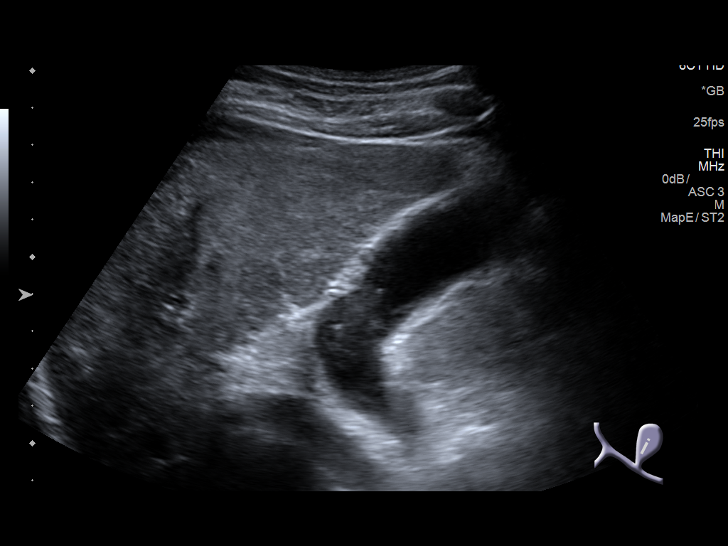
[im 6/70]
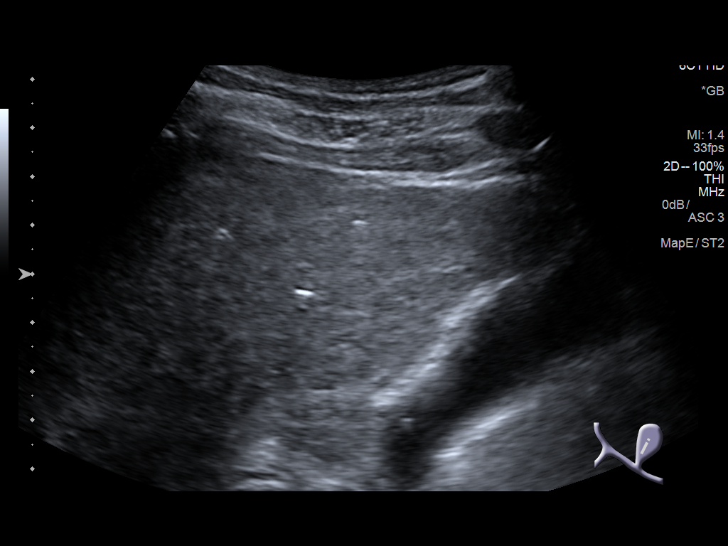
[im 12/70]
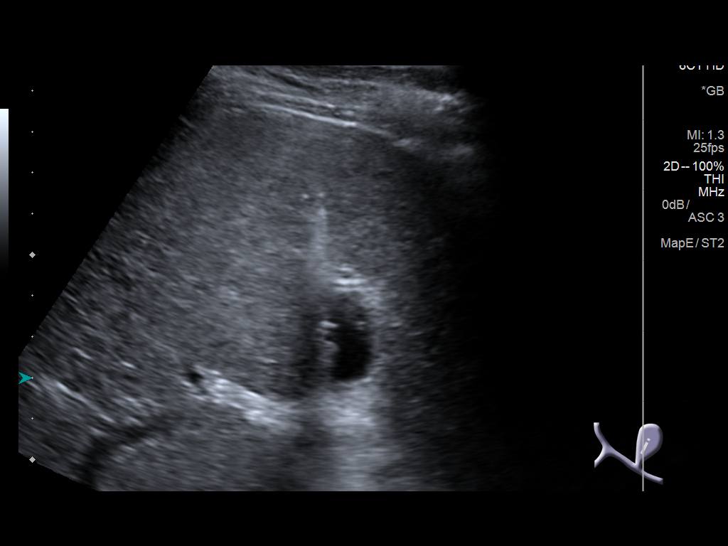
[im 18/70]
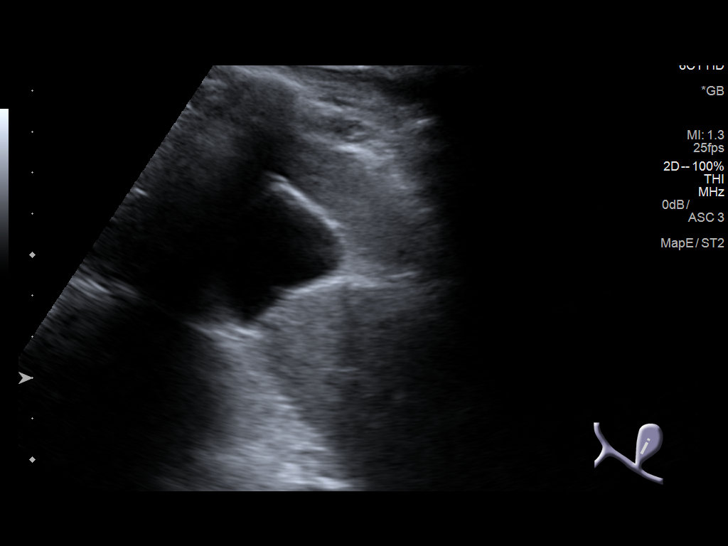
[im 24/70]
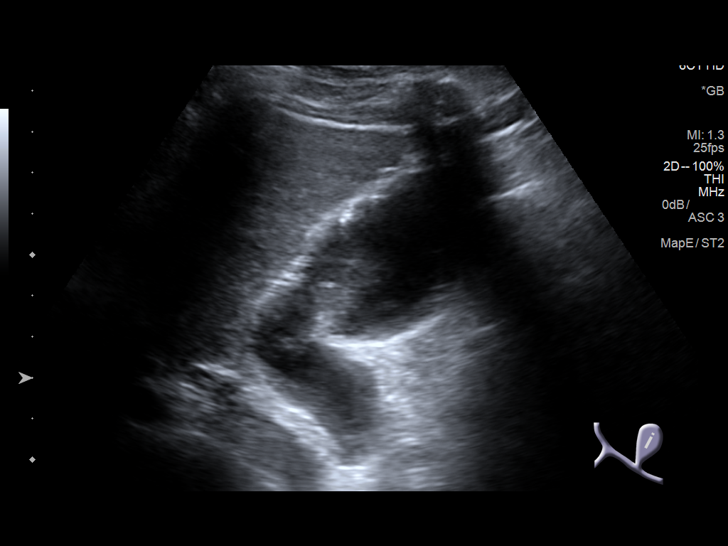
[im 26/70]
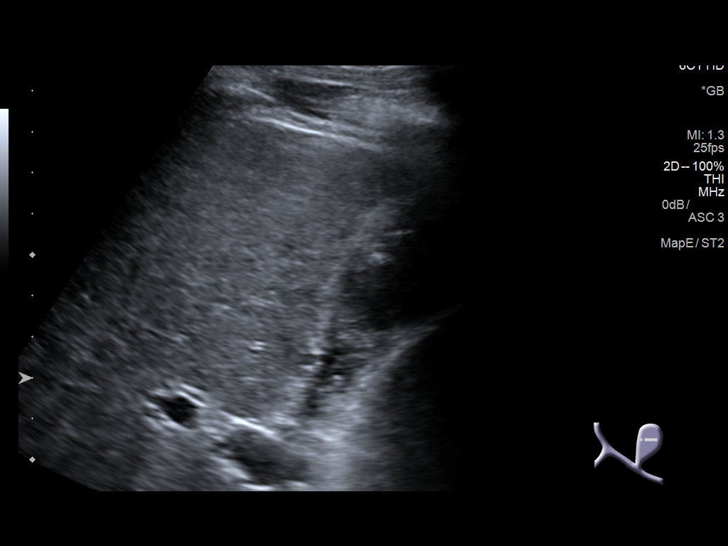
[im 32/70]
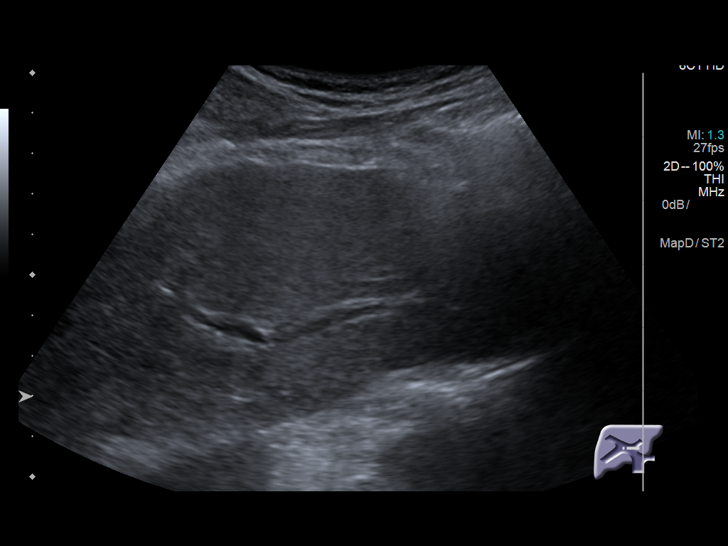
[im 38/70]
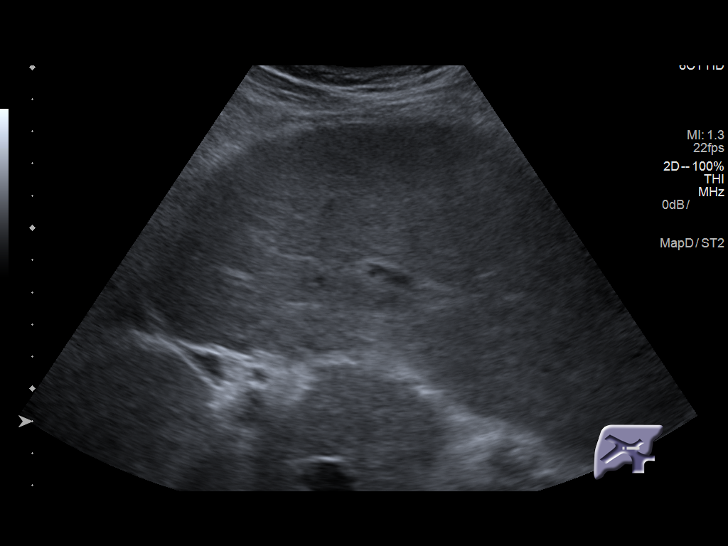
[im 44/70]
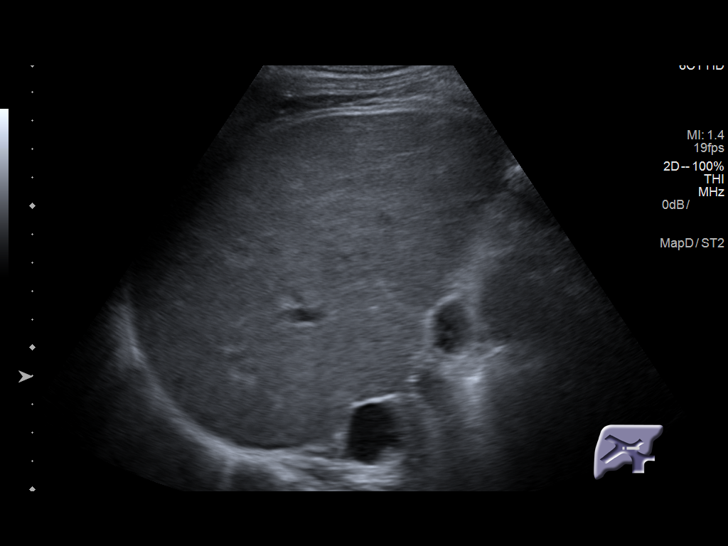
[im 47/70]
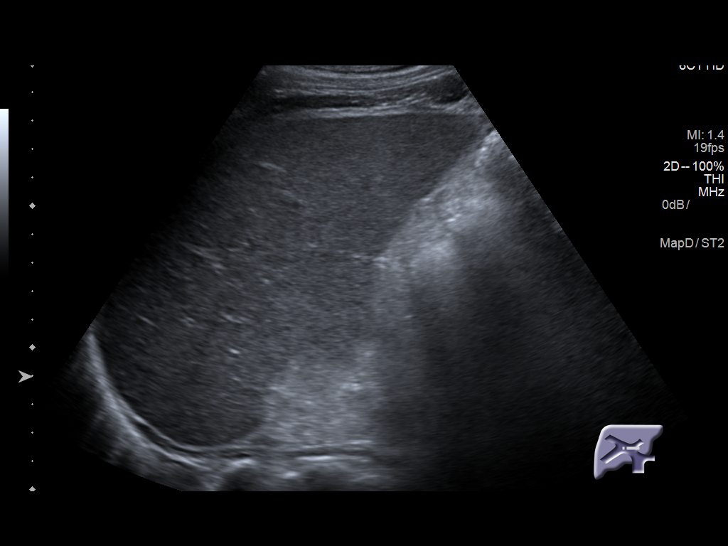
[im 52/70]
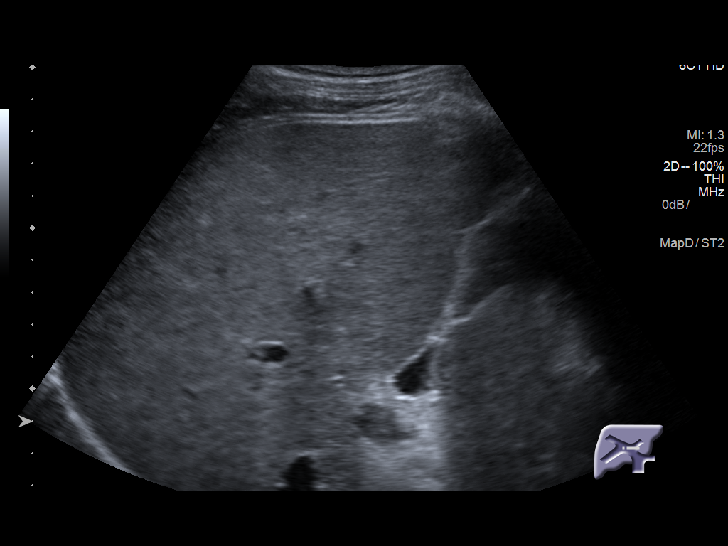
[im 58/70]
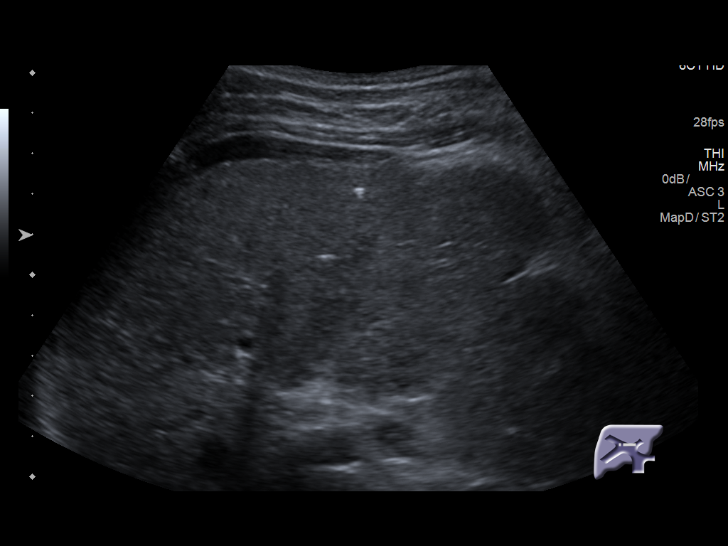
[im 64/70]
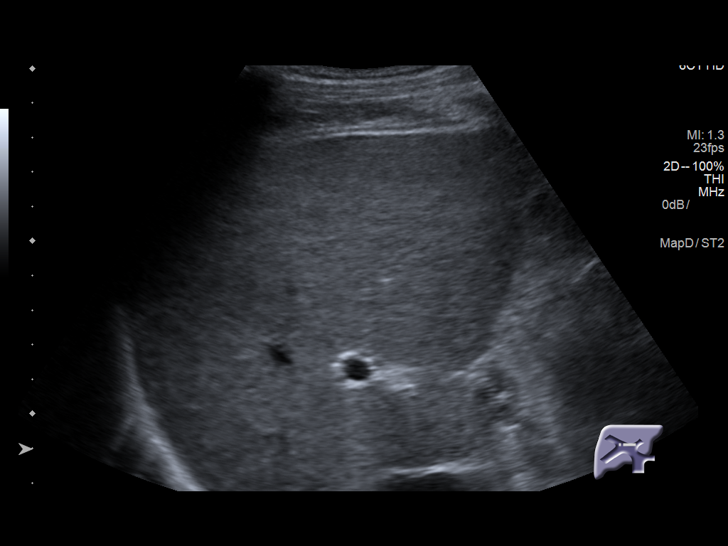
[im 70/70]
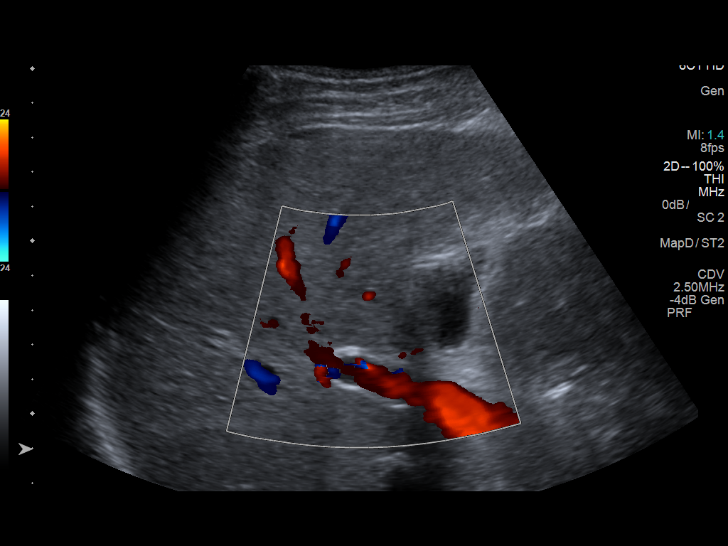

[14 of 25 positions shown; findings below may reference images not displayed]

FINDINGS: Gallbladder:

Multiple gallbladder polyps are noted. No definitive gallstones or
gallbladder sludge are seen. No pericholecystic fluid is noted.

Common bile duct:

Diameter: 4.5 mm.

Liver:

No focal lesion identified. Within normal limits in parenchymal
echogenicity. Portal vein is patent on color Doppler imaging with
normal direction of blood flow towards the liver. Minimal
perihepatic ascites is noted.
IMPRESSION: Minimal ascites.

Gallbladder polyps.

## 2019-08-12 DIAGNOSIS — M24662 Ankylosis, left knee: Secondary | ICD-10-CM | POA: Diagnosis not present

## 2019-08-12 DIAGNOSIS — M25662 Stiffness of left knee, not elsewhere classified: Secondary | ICD-10-CM | POA: Diagnosis not present

## 2019-08-12 DIAGNOSIS — R2689 Other abnormalities of gait and mobility: Secondary | ICD-10-CM | POA: Diagnosis not present

## 2019-08-12 DIAGNOSIS — M25562 Pain in left knee: Secondary | ICD-10-CM | POA: Diagnosis not present

## 2019-08-15 DIAGNOSIS — R2689 Other abnormalities of gait and mobility: Secondary | ICD-10-CM | POA: Diagnosis not present

## 2019-08-15 DIAGNOSIS — M25662 Stiffness of left knee, not elsewhere classified: Secondary | ICD-10-CM | POA: Diagnosis not present

## 2019-08-15 DIAGNOSIS — M25562 Pain in left knee: Secondary | ICD-10-CM | POA: Diagnosis not present

## 2019-08-15 DIAGNOSIS — M24662 Ankylosis, left knee: Secondary | ICD-10-CM | POA: Diagnosis not present

## 2019-08-17 DIAGNOSIS — R2689 Other abnormalities of gait and mobility: Secondary | ICD-10-CM | POA: Diagnosis not present

## 2019-08-17 DIAGNOSIS — M25562 Pain in left knee: Secondary | ICD-10-CM | POA: Diagnosis not present

## 2019-08-17 DIAGNOSIS — M24662 Ankylosis, left knee: Secondary | ICD-10-CM | POA: Diagnosis not present

## 2019-08-17 DIAGNOSIS — M25662 Stiffness of left knee, not elsewhere classified: Secondary | ICD-10-CM | POA: Diagnosis not present

## 2019-08-19 DIAGNOSIS — M25562 Pain in left knee: Secondary | ICD-10-CM | POA: Diagnosis not present

## 2019-08-19 DIAGNOSIS — M24662 Ankylosis, left knee: Secondary | ICD-10-CM | POA: Diagnosis not present

## 2019-08-19 DIAGNOSIS — M25662 Stiffness of left knee, not elsewhere classified: Secondary | ICD-10-CM | POA: Diagnosis not present

## 2019-08-19 DIAGNOSIS — R2689 Other abnormalities of gait and mobility: Secondary | ICD-10-CM | POA: Diagnosis not present

## 2019-08-24 DIAGNOSIS — K2100 Gastro-esophageal reflux disease with esophagitis, without bleeding: Secondary | ICD-10-CM | POA: Diagnosis not present

## 2019-08-24 DIAGNOSIS — I10 Essential (primary) hypertension: Secondary | ICD-10-CM | POA: Diagnosis not present

## 2019-08-24 DIAGNOSIS — J449 Chronic obstructive pulmonary disease, unspecified: Secondary | ICD-10-CM | POA: Diagnosis not present

## 2019-08-24 DIAGNOSIS — R2689 Other abnormalities of gait and mobility: Secondary | ICD-10-CM | POA: Diagnosis not present

## 2019-08-24 DIAGNOSIS — Z0000 Encounter for general adult medical examination without abnormal findings: Secondary | ICD-10-CM | POA: Diagnosis not present

## 2019-08-24 DIAGNOSIS — M24662 Ankylosis, left knee: Secondary | ICD-10-CM | POA: Diagnosis not present

## 2019-08-24 DIAGNOSIS — Z1331 Encounter for screening for depression: Secondary | ICD-10-CM | POA: Diagnosis not present

## 2019-08-24 DIAGNOSIS — M25562 Pain in left knee: Secondary | ICD-10-CM | POA: Diagnosis not present

## 2019-08-24 DIAGNOSIS — M25662 Stiffness of left knee, not elsewhere classified: Secondary | ICD-10-CM | POA: Diagnosis not present

## 2019-08-26 DIAGNOSIS — M25562 Pain in left knee: Secondary | ICD-10-CM | POA: Diagnosis not present

## 2019-08-26 DIAGNOSIS — R2689 Other abnormalities of gait and mobility: Secondary | ICD-10-CM | POA: Diagnosis not present

## 2019-08-26 DIAGNOSIS — M25662 Stiffness of left knee, not elsewhere classified: Secondary | ICD-10-CM | POA: Diagnosis not present

## 2019-08-26 DIAGNOSIS — M24662 Ankylosis, left knee: Secondary | ICD-10-CM | POA: Diagnosis not present

## 2019-08-30 DIAGNOSIS — M24662 Ankylosis, left knee: Secondary | ICD-10-CM | POA: Diagnosis not present

## 2019-08-30 DIAGNOSIS — M25562 Pain in left knee: Secondary | ICD-10-CM | POA: Diagnosis not present

## 2019-08-30 DIAGNOSIS — M25662 Stiffness of left knee, not elsewhere classified: Secondary | ICD-10-CM | POA: Diagnosis not present

## 2019-08-30 DIAGNOSIS — R2689 Other abnormalities of gait and mobility: Secondary | ICD-10-CM | POA: Diagnosis not present

## 2019-08-31 DIAGNOSIS — M25562 Pain in left knee: Secondary | ICD-10-CM | POA: Diagnosis not present

## 2019-08-31 DIAGNOSIS — R2689 Other abnormalities of gait and mobility: Secondary | ICD-10-CM | POA: Diagnosis not present

## 2019-08-31 DIAGNOSIS — M24662 Ankylosis, left knee: Secondary | ICD-10-CM | POA: Diagnosis not present

## 2019-08-31 DIAGNOSIS — M25662 Stiffness of left knee, not elsewhere classified: Secondary | ICD-10-CM | POA: Diagnosis not present

## 2019-09-02 DIAGNOSIS — M25562 Pain in left knee: Secondary | ICD-10-CM | POA: Diagnosis not present

## 2019-09-02 DIAGNOSIS — M24662 Ankylosis, left knee: Secondary | ICD-10-CM | POA: Diagnosis not present

## 2019-09-02 DIAGNOSIS — M25662 Stiffness of left knee, not elsewhere classified: Secondary | ICD-10-CM | POA: Diagnosis not present

## 2019-09-02 DIAGNOSIS — R2689 Other abnormalities of gait and mobility: Secondary | ICD-10-CM | POA: Diagnosis not present

## 2019-09-08 DIAGNOSIS — R2689 Other abnormalities of gait and mobility: Secondary | ICD-10-CM | POA: Diagnosis not present

## 2019-09-08 DIAGNOSIS — M24662 Ankylosis, left knee: Secondary | ICD-10-CM | POA: Diagnosis not present

## 2019-09-08 DIAGNOSIS — M25662 Stiffness of left knee, not elsewhere classified: Secondary | ICD-10-CM | POA: Diagnosis not present

## 2019-09-08 DIAGNOSIS — M25562 Pain in left knee: Secondary | ICD-10-CM | POA: Diagnosis not present

## 2019-09-09 DIAGNOSIS — R2689 Other abnormalities of gait and mobility: Secondary | ICD-10-CM | POA: Diagnosis not present

## 2019-09-09 DIAGNOSIS — M25562 Pain in left knee: Secondary | ICD-10-CM | POA: Diagnosis not present

## 2019-09-09 DIAGNOSIS — M25662 Stiffness of left knee, not elsewhere classified: Secondary | ICD-10-CM | POA: Diagnosis not present

## 2019-09-09 DIAGNOSIS — M24662 Ankylosis, left knee: Secondary | ICD-10-CM | POA: Diagnosis not present

## 2019-09-13 DIAGNOSIS — M24662 Ankylosis, left knee: Secondary | ICD-10-CM | POA: Diagnosis not present

## 2019-09-13 DIAGNOSIS — R2689 Other abnormalities of gait and mobility: Secondary | ICD-10-CM | POA: Diagnosis not present

## 2019-09-13 DIAGNOSIS — M25562 Pain in left knee: Secondary | ICD-10-CM | POA: Diagnosis not present

## 2019-09-13 DIAGNOSIS — M25662 Stiffness of left knee, not elsewhere classified: Secondary | ICD-10-CM | POA: Diagnosis not present

## 2019-09-16 DIAGNOSIS — M25662 Stiffness of left knee, not elsewhere classified: Secondary | ICD-10-CM | POA: Diagnosis not present

## 2019-09-16 DIAGNOSIS — R2689 Other abnormalities of gait and mobility: Secondary | ICD-10-CM | POA: Diagnosis not present

## 2019-09-16 DIAGNOSIS — M24662 Ankylosis, left knee: Secondary | ICD-10-CM | POA: Diagnosis not present

## 2019-09-16 DIAGNOSIS — M25562 Pain in left knee: Secondary | ICD-10-CM | POA: Diagnosis not present

## 2019-09-20 DIAGNOSIS — M24662 Ankylosis, left knee: Secondary | ICD-10-CM | POA: Diagnosis not present

## 2019-09-20 DIAGNOSIS — M25562 Pain in left knee: Secondary | ICD-10-CM | POA: Diagnosis not present

## 2019-09-20 DIAGNOSIS — M25662 Stiffness of left knee, not elsewhere classified: Secondary | ICD-10-CM | POA: Diagnosis not present

## 2019-09-20 DIAGNOSIS — R2689 Other abnormalities of gait and mobility: Secondary | ICD-10-CM | POA: Diagnosis not present

## 2019-09-23 DIAGNOSIS — M25662 Stiffness of left knee, not elsewhere classified: Secondary | ICD-10-CM | POA: Diagnosis not present

## 2019-09-23 DIAGNOSIS — M24662 Ankylosis, left knee: Secondary | ICD-10-CM | POA: Diagnosis not present

## 2019-09-23 DIAGNOSIS — M25562 Pain in left knee: Secondary | ICD-10-CM | POA: Diagnosis not present

## 2019-09-23 DIAGNOSIS — R2689 Other abnormalities of gait and mobility: Secondary | ICD-10-CM | POA: Diagnosis not present

## 2019-09-27 DIAGNOSIS — M25662 Stiffness of left knee, not elsewhere classified: Secondary | ICD-10-CM | POA: Diagnosis not present

## 2019-09-27 DIAGNOSIS — R2689 Other abnormalities of gait and mobility: Secondary | ICD-10-CM | POA: Diagnosis not present

## 2019-09-27 DIAGNOSIS — M25562 Pain in left knee: Secondary | ICD-10-CM | POA: Diagnosis not present

## 2019-09-27 DIAGNOSIS — M24662 Ankylosis, left knee: Secondary | ICD-10-CM | POA: Diagnosis not present

## 2019-09-29 DIAGNOSIS — M24662 Ankylosis, left knee: Secondary | ICD-10-CM | POA: Diagnosis not present

## 2019-09-29 DIAGNOSIS — M25562 Pain in left knee: Secondary | ICD-10-CM | POA: Diagnosis not present

## 2019-09-29 DIAGNOSIS — R2689 Other abnormalities of gait and mobility: Secondary | ICD-10-CM | POA: Diagnosis not present

## 2019-09-29 DIAGNOSIS — M25662 Stiffness of left knee, not elsewhere classified: Secondary | ICD-10-CM | POA: Diagnosis not present

## 2019-09-30 DIAGNOSIS — Z96652 Presence of left artificial knee joint: Secondary | ICD-10-CM | POA: Diagnosis not present

## 2019-09-30 DIAGNOSIS — M1711 Unilateral primary osteoarthritis, right knee: Secondary | ICD-10-CM | POA: Diagnosis not present

## 2019-11-01 DIAGNOSIS — M25562 Pain in left knee: Secondary | ICD-10-CM | POA: Diagnosis not present

## 2019-11-24 DIAGNOSIS — M25562 Pain in left knee: Secondary | ICD-10-CM | POA: Diagnosis not present

## 2019-11-24 DIAGNOSIS — I10 Essential (primary) hypertension: Secondary | ICD-10-CM | POA: Diagnosis not present

## 2019-11-24 DIAGNOSIS — N400 Enlarged prostate without lower urinary tract symptoms: Secondary | ICD-10-CM | POA: Diagnosis not present

## 2019-11-24 DIAGNOSIS — J449 Chronic obstructive pulmonary disease, unspecified: Secondary | ICD-10-CM | POA: Diagnosis not present

## 2019-11-24 DIAGNOSIS — K2100 Gastro-esophageal reflux disease with esophagitis, without bleeding: Secondary | ICD-10-CM | POA: Diagnosis not present

## 2019-12-10 IMAGING — DX DG KNEE COMPLETE 4+V*L*
4 series · 4 of 4 positions shown · non-contrast
Comparison: Two-view knee radiographs 10/07/2016

CLINICAL DATA: Pain, swelling, warmth, and erythema to the left
knee. Recent revision of total knee arthroplasty.

EXAM:
LEFT KNEE - COMPLETE 4+ VIEW

[knee ap]
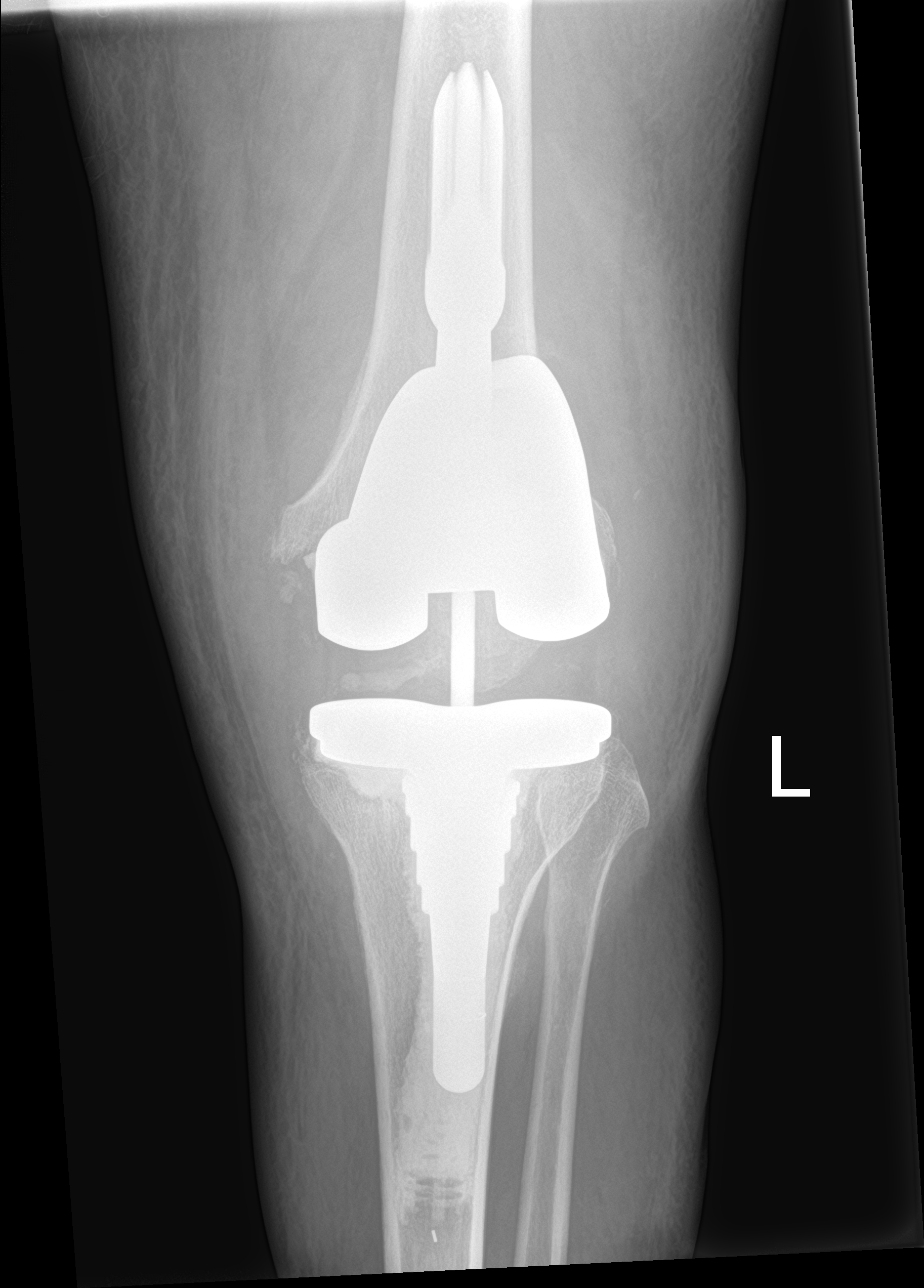

[knee lat]
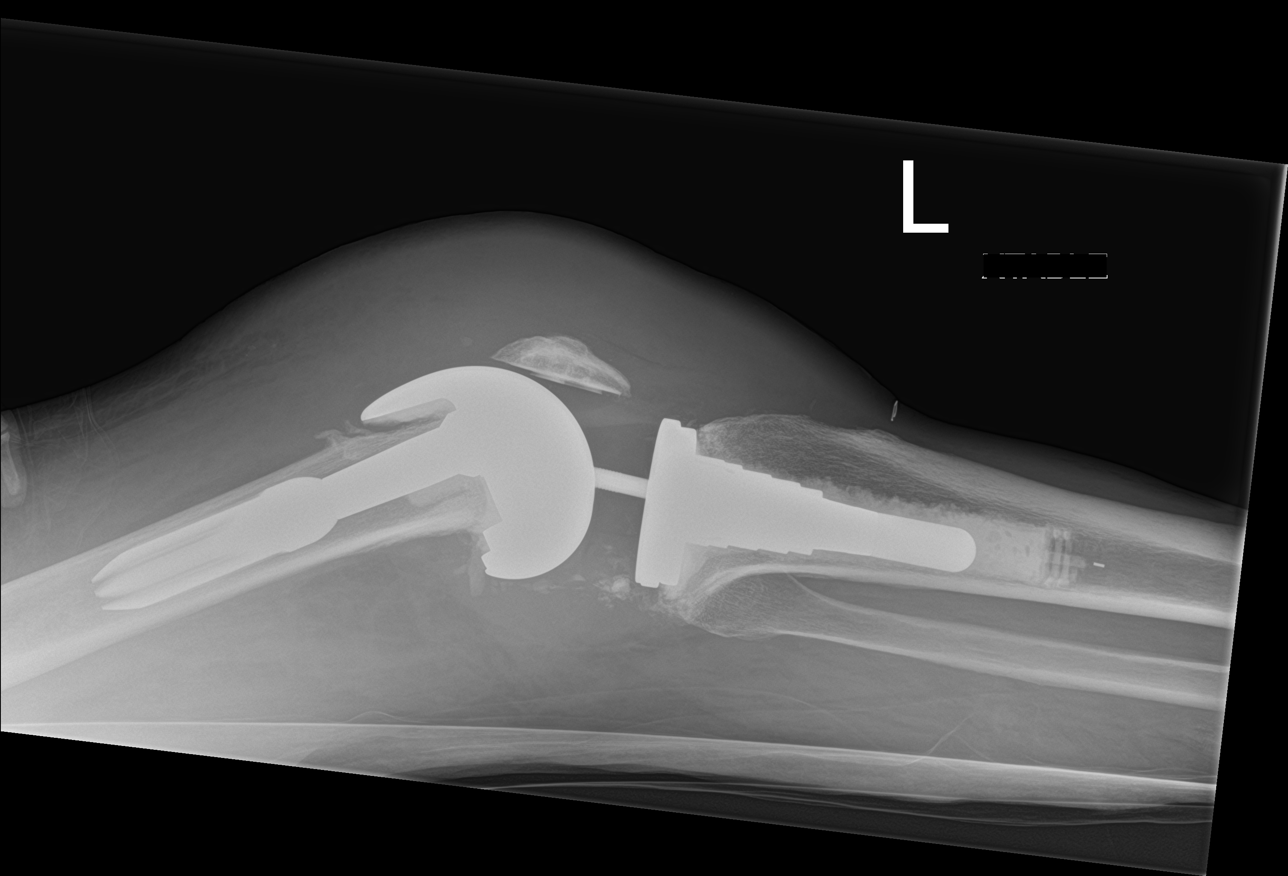

[knee obl (1 of 2)]
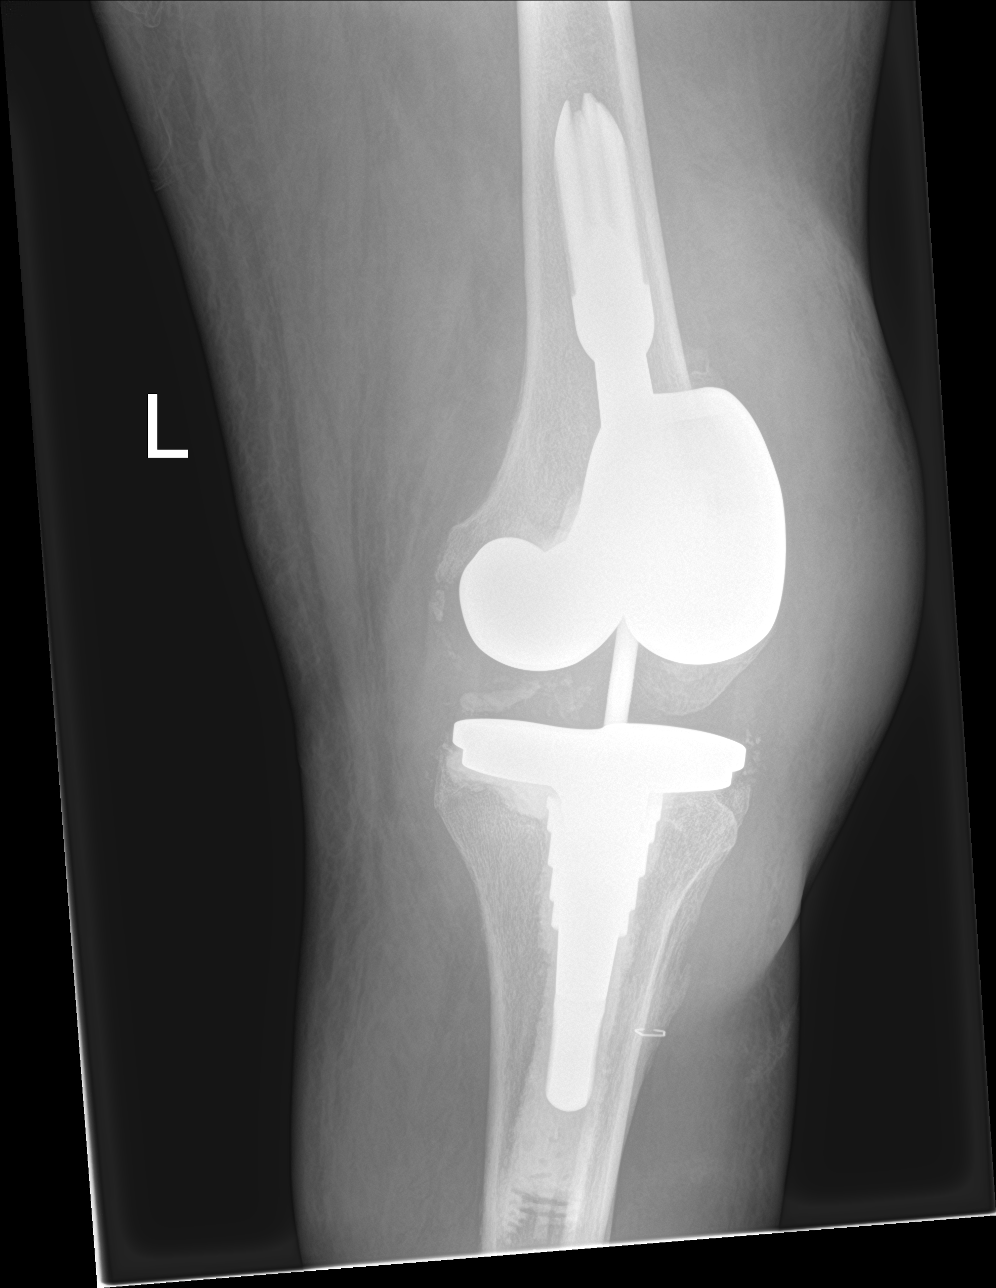

[knee obl (2 of 2)]
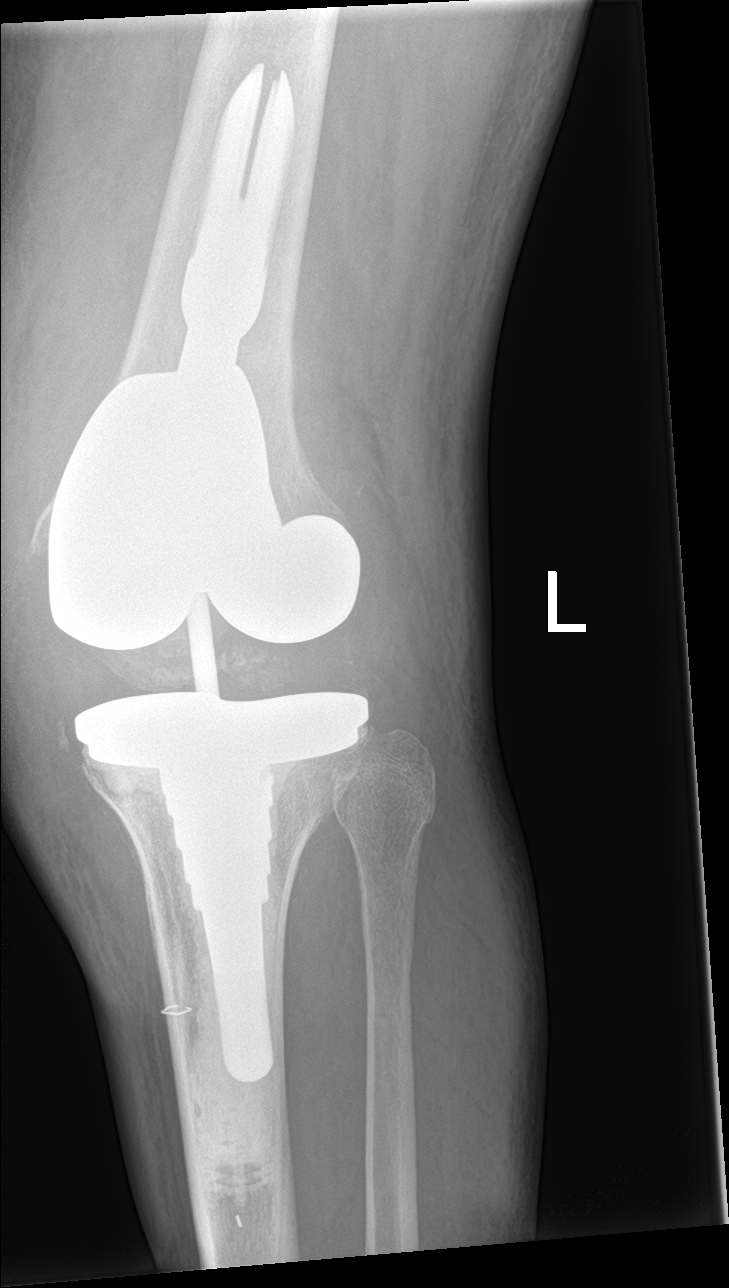

[4 of 4 positions shown; findings below may reference images not displayed]

FINDINGS: Left total knee arthroplasty revision is noted. A constrained
arthroplasty is now in place. A large joint effusion is present.
There is diffuse edema anterior to the knee. Cemented tibial
component is in place. Femoral component is in place.
IMPRESSION: 1. Large joint effusion and extensive soft tissue edema is
concerning for infection.
2. Left total knee arthroplasty revision. Components appear well
seated.

## 2019-12-13 DIAGNOSIS — Z23 Encounter for immunization: Secondary | ICD-10-CM | POA: Diagnosis not present

## 2020-01-09 DIAGNOSIS — L03818 Cellulitis of other sites: Secondary | ICD-10-CM | POA: Diagnosis not present

## 2020-01-16 DIAGNOSIS — B20 Human immunodeficiency virus [HIV] disease: Secondary | ICD-10-CM | POA: Diagnosis not present

## 2020-02-02 DIAGNOSIS — M810 Age-related osteoporosis without current pathological fracture: Secondary | ICD-10-CM | POA: Diagnosis not present

## 2020-02-02 DIAGNOSIS — Z7952 Long term (current) use of systemic steroids: Secondary | ICD-10-CM | POA: Diagnosis not present

## 2020-02-22 DIAGNOSIS — M170 Bilateral primary osteoarthritis of knee: Secondary | ICD-10-CM | POA: Diagnosis not present

## 2020-02-23 DIAGNOSIS — N182 Chronic kidney disease, stage 2 (mild): Secondary | ICD-10-CM | POA: Diagnosis not present

## 2020-02-23 DIAGNOSIS — K2100 Gastro-esophageal reflux disease with esophagitis, without bleeding: Secondary | ICD-10-CM | POA: Diagnosis not present

## 2020-02-23 DIAGNOSIS — J449 Chronic obstructive pulmonary disease, unspecified: Secondary | ICD-10-CM | POA: Diagnosis not present

## 2020-02-23 DIAGNOSIS — I10 Essential (primary) hypertension: Secondary | ICD-10-CM | POA: Diagnosis not present

## 2020-02-23 DIAGNOSIS — N400 Enlarged prostate without lower urinary tract symptoms: Secondary | ICD-10-CM | POA: Diagnosis not present

## 2020-05-24 DIAGNOSIS — K2100 Gastro-esophageal reflux disease with esophagitis, without bleeding: Secondary | ICD-10-CM | POA: Diagnosis not present

## 2020-05-24 DIAGNOSIS — N400 Enlarged prostate without lower urinary tract symptoms: Secondary | ICD-10-CM | POA: Diagnosis not present

## 2020-05-24 DIAGNOSIS — N182 Chronic kidney disease, stage 2 (mild): Secondary | ICD-10-CM | POA: Diagnosis not present

## 2020-05-24 DIAGNOSIS — I10 Essential (primary) hypertension: Secondary | ICD-10-CM | POA: Diagnosis not present

## 2020-05-24 DIAGNOSIS — J449 Chronic obstructive pulmonary disease, unspecified: Secondary | ICD-10-CM | POA: Diagnosis not present

## 2020-06-08 DIAGNOSIS — T8459XA Infection and inflammatory reaction due to other internal joint prosthesis, initial encounter: Secondary | ICD-10-CM | POA: Diagnosis not present

## 2020-06-08 DIAGNOSIS — M6250 Muscle wasting and atrophy, not elsewhere classified, unspecified site: Secondary | ICD-10-CM | POA: Diagnosis not present

## 2020-06-08 DIAGNOSIS — M25562 Pain in left knee: Secondary | ICD-10-CM | POA: Diagnosis not present

## 2020-06-08 DIAGNOSIS — M7989 Other specified soft tissue disorders: Secondary | ICD-10-CM | POA: Diagnosis not present

## 2020-06-08 DIAGNOSIS — Z20822 Contact with and (suspected) exposure to covid-19: Secondary | ICD-10-CM | POA: Diagnosis not present

## 2020-06-08 DIAGNOSIS — Z21 Asymptomatic human immunodeficiency virus [HIV] infection status: Secondary | ICD-10-CM | POA: Diagnosis not present

## 2020-06-08 DIAGNOSIS — Z96642 Presence of left artificial hip joint: Secondary | ICD-10-CM | POA: Diagnosis not present

## 2020-06-08 DIAGNOSIS — R52 Pain, unspecified: Secondary | ICD-10-CM | POA: Diagnosis not present

## 2020-06-08 DIAGNOSIS — R531 Weakness: Secondary | ICD-10-CM | POA: Diagnosis not present

## 2020-06-08 DIAGNOSIS — Z743 Need for continuous supervision: Secondary | ICD-10-CM | POA: Diagnosis not present

## 2020-06-08 DIAGNOSIS — B182 Chronic viral hepatitis C: Secondary | ICD-10-CM | POA: Diagnosis not present

## 2020-06-08 DIAGNOSIS — M1A9XX0 Chronic gout, unspecified, without tophus (tophi): Secondary | ICD-10-CM | POA: Diagnosis not present

## 2020-06-08 DIAGNOSIS — B20 Human immunodeficiency virus [HIV] disease: Secondary | ICD-10-CM | POA: Diagnosis not present

## 2020-06-08 DIAGNOSIS — M009 Pyogenic arthritis, unspecified: Secondary | ICD-10-CM | POA: Diagnosis not present

## 2020-06-08 DIAGNOSIS — T8453XA Infection and inflammatory reaction due to internal right knee prosthesis, initial encounter: Secondary | ICD-10-CM | POA: Diagnosis not present

## 2020-06-08 DIAGNOSIS — Z885 Allergy status to narcotic agent status: Secondary | ICD-10-CM | POA: Diagnosis not present

## 2020-06-08 DIAGNOSIS — Z96652 Presence of left artificial knee joint: Secondary | ICD-10-CM | POA: Diagnosis not present

## 2020-06-09 DIAGNOSIS — B20 Human immunodeficiency virus [HIV] disease: Secondary | ICD-10-CM | POA: Diagnosis not present

## 2020-06-09 DIAGNOSIS — D649 Anemia, unspecified: Secondary | ICD-10-CM | POA: Diagnosis not present

## 2020-06-09 DIAGNOSIS — Z01818 Encounter for other preprocedural examination: Secondary | ICD-10-CM | POA: Diagnosis not present

## 2020-06-09 DIAGNOSIS — Z96652 Presence of left artificial knee joint: Secondary | ICD-10-CM | POA: Diagnosis not present

## 2020-06-09 DIAGNOSIS — N189 Chronic kidney disease, unspecified: Secondary | ICD-10-CM | POA: Diagnosis not present

## 2020-06-09 DIAGNOSIS — B1920 Unspecified viral hepatitis C without hepatic coma: Secondary | ICD-10-CM | POA: Diagnosis not present

## 2020-06-09 DIAGNOSIS — N179 Acute kidney failure, unspecified: Secondary | ICD-10-CM | POA: Diagnosis not present

## 2020-06-09 DIAGNOSIS — T8454XA Infection and inflammatory reaction due to internal left knee prosthesis, initial encounter: Secondary | ICD-10-CM | POA: Diagnosis not present

## 2020-06-09 DIAGNOSIS — I129 Hypertensive chronic kidney disease with stage 1 through stage 4 chronic kidney disease, or unspecified chronic kidney disease: Secondary | ICD-10-CM | POA: Diagnosis not present

## 2020-06-09 DIAGNOSIS — I499 Cardiac arrhythmia, unspecified: Secondary | ICD-10-CM | POA: Diagnosis not present

## 2020-06-10 DIAGNOSIS — B20 Human immunodeficiency virus [HIV] disease: Secondary | ICD-10-CM | POA: Diagnosis not present

## 2020-06-10 DIAGNOSIS — B353 Tinea pedis: Secondary | ICD-10-CM | POA: Diagnosis present

## 2020-06-10 DIAGNOSIS — I129 Hypertensive chronic kidney disease with stage 1 through stage 4 chronic kidney disease, or unspecified chronic kidney disease: Secondary | ICD-10-CM | POA: Diagnosis present

## 2020-06-10 DIAGNOSIS — E785 Hyperlipidemia, unspecified: Secondary | ICD-10-CM | POA: Diagnosis present

## 2020-06-10 DIAGNOSIS — D649 Anemia, unspecified: Secondary | ICD-10-CM | POA: Diagnosis not present

## 2020-06-10 DIAGNOSIS — F0390 Unspecified dementia without behavioral disturbance: Secondary | ICD-10-CM | POA: Diagnosis not present

## 2020-06-10 DIAGNOSIS — N189 Chronic kidney disease, unspecified: Secondary | ICD-10-CM | POA: Diagnosis not present

## 2020-06-10 DIAGNOSIS — N179 Acute kidney failure, unspecified: Secondary | ICD-10-CM | POA: Diagnosis present

## 2020-06-10 DIAGNOSIS — Z96659 Presence of unspecified artificial knee joint: Secondary | ICD-10-CM | POA: Diagnosis not present

## 2020-06-10 DIAGNOSIS — D509 Iron deficiency anemia, unspecified: Secondary | ICD-10-CM | POA: Diagnosis present

## 2020-06-10 DIAGNOSIS — D631 Anemia in chronic kidney disease: Secondary | ICD-10-CM | POA: Diagnosis present

## 2020-06-10 DIAGNOSIS — B182 Chronic viral hepatitis C: Secondary | ICD-10-CM | POA: Diagnosis present

## 2020-06-10 DIAGNOSIS — D8481 Immunodeficiency due to conditions classified elsewhere: Secondary | ICD-10-CM | POA: Diagnosis not present

## 2020-06-10 DIAGNOSIS — T8454XA Infection and inflammatory reaction due to internal left knee prosthesis, initial encounter: Secondary | ICD-10-CM | POA: Diagnosis present

## 2020-06-10 DIAGNOSIS — M6281 Muscle weakness (generalized): Secondary | ICD-10-CM | POA: Diagnosis not present

## 2020-06-10 DIAGNOSIS — D849 Immunodeficiency, unspecified: Secondary | ICD-10-CM | POA: Diagnosis not present

## 2020-06-10 DIAGNOSIS — J449 Chronic obstructive pulmonary disease, unspecified: Secondary | ICD-10-CM | POA: Diagnosis present

## 2020-06-10 DIAGNOSIS — M25462 Effusion, left knee: Secondary | ICD-10-CM | POA: Diagnosis not present

## 2020-06-10 DIAGNOSIS — R262 Difficulty in walking, not elsewhere classified: Secondary | ICD-10-CM | POA: Diagnosis not present

## 2020-06-10 DIAGNOSIS — Z21 Asymptomatic human immunodeficiency virus [HIV] infection status: Secondary | ICD-10-CM | POA: Diagnosis present

## 2020-06-10 DIAGNOSIS — M109 Gout, unspecified: Secondary | ICD-10-CM | POA: Diagnosis present

## 2020-06-10 DIAGNOSIS — M1710 Unilateral primary osteoarthritis, unspecified knee: Secondary | ICD-10-CM | POA: Diagnosis present

## 2020-06-10 DIAGNOSIS — T8450XD Infection and inflammatory reaction due to unspecified internal joint prosthesis, subsequent encounter: Secondary | ICD-10-CM | POA: Diagnosis not present

## 2020-06-10 DIAGNOSIS — Z20822 Contact with and (suspected) exposure to covid-19: Secondary | ICD-10-CM | POA: Diagnosis present

## 2020-06-10 DIAGNOSIS — K5900 Constipation, unspecified: Secondary | ICD-10-CM | POA: Diagnosis not present

## 2020-06-10 DIAGNOSIS — B351 Tinea unguium: Secondary | ICD-10-CM | POA: Diagnosis present

## 2020-06-10 DIAGNOSIS — M25461 Effusion, right knee: Secondary | ICD-10-CM | POA: Diagnosis not present

## 2020-06-10 DIAGNOSIS — R5381 Other malaise: Secondary | ICD-10-CM | POA: Diagnosis not present

## 2020-06-10 DIAGNOSIS — T8450XA Infection and inflammatory reaction due to unspecified internal joint prosthesis, initial encounter: Secondary | ICD-10-CM | POA: Diagnosis not present

## 2020-06-10 DIAGNOSIS — F17200 Nicotine dependence, unspecified, uncomplicated: Secondary | ICD-10-CM | POA: Diagnosis present

## 2020-06-10 DIAGNOSIS — R279 Unspecified lack of coordination: Secondary | ICD-10-CM | POA: Diagnosis not present

## 2020-06-10 DIAGNOSIS — N401 Enlarged prostate with lower urinary tract symptoms: Secondary | ICD-10-CM | POA: Diagnosis not present

## 2020-06-10 DIAGNOSIS — N400 Enlarged prostate without lower urinary tract symptoms: Secondary | ICD-10-CM | POA: Diagnosis present

## 2020-06-10 DIAGNOSIS — M1990 Unspecified osteoarthritis, unspecified site: Secondary | ICD-10-CM | POA: Diagnosis not present

## 2020-06-10 DIAGNOSIS — A6002 Herpesviral infection of other male genital organs: Secondary | ICD-10-CM | POA: Diagnosis not present

## 2020-06-10 DIAGNOSIS — G4700 Insomnia, unspecified: Secondary | ICD-10-CM | POA: Diagnosis not present

## 2020-06-10 DIAGNOSIS — M1711 Unilateral primary osteoarthritis, right knee: Secondary | ICD-10-CM | POA: Diagnosis present

## 2020-06-10 DIAGNOSIS — B9689 Other specified bacterial agents as the cause of diseases classified elsewhere: Secondary | ICD-10-CM | POA: Diagnosis present

## 2020-06-10 DIAGNOSIS — M65861 Other synovitis and tenosynovitis, right lower leg: Secondary | ICD-10-CM | POA: Diagnosis not present

## 2020-06-10 DIAGNOSIS — K7460 Unspecified cirrhosis of liver: Secondary | ICD-10-CM | POA: Diagnosis present

## 2020-06-10 DIAGNOSIS — B1920 Unspecified viral hepatitis C without hepatic coma: Secondary | ICD-10-CM | POA: Diagnosis not present

## 2020-06-10 DIAGNOSIS — I10 Essential (primary) hypertension: Secondary | ICD-10-CM | POA: Diagnosis not present

## 2020-06-10 DIAGNOSIS — M62838 Other muscle spasm: Secondary | ICD-10-CM | POA: Diagnosis present

## 2020-06-10 DIAGNOSIS — K219 Gastro-esophageal reflux disease without esophagitis: Secondary | ICD-10-CM | POA: Diagnosis present

## 2020-06-15 DIAGNOSIS — B9689 Other specified bacterial agents as the cause of diseases classified elsewhere: Secondary | ICD-10-CM | POA: Diagnosis not present

## 2020-06-15 DIAGNOSIS — H524 Presbyopia: Secondary | ICD-10-CM | POA: Diagnosis not present

## 2020-06-15 DIAGNOSIS — G4700 Insomnia, unspecified: Secondary | ICD-10-CM | POA: Diagnosis not present

## 2020-06-15 DIAGNOSIS — F0390 Unspecified dementia without behavioral disturbance: Secondary | ICD-10-CM | POA: Diagnosis not present

## 2020-06-15 DIAGNOSIS — R262 Difficulty in walking, not elsewhere classified: Secondary | ICD-10-CM | POA: Diagnosis not present

## 2020-06-15 DIAGNOSIS — J449 Chronic obstructive pulmonary disease, unspecified: Secondary | ICD-10-CM | POA: Diagnosis not present

## 2020-06-15 DIAGNOSIS — T8459XD Infection and inflammatory reaction due to other internal joint prosthesis, subsequent encounter: Secondary | ICD-10-CM | POA: Diagnosis not present

## 2020-06-15 DIAGNOSIS — K219 Gastro-esophageal reflux disease without esophagitis: Secondary | ICD-10-CM | POA: Diagnosis not present

## 2020-06-15 DIAGNOSIS — M25561 Pain in right knee: Secondary | ICD-10-CM | POA: Diagnosis not present

## 2020-06-15 DIAGNOSIS — I10 Essential (primary) hypertension: Secondary | ICD-10-CM | POA: Diagnosis not present

## 2020-06-15 DIAGNOSIS — R279 Unspecified lack of coordination: Secondary | ICD-10-CM | POA: Diagnosis not present

## 2020-06-15 DIAGNOSIS — M6281 Muscle weakness (generalized): Secondary | ICD-10-CM | POA: Diagnosis not present

## 2020-06-15 DIAGNOSIS — Z79899 Other long term (current) drug therapy: Secondary | ICD-10-CM | POA: Diagnosis not present

## 2020-06-15 DIAGNOSIS — K5900 Constipation, unspecified: Secondary | ICD-10-CM | POA: Diagnosis not present

## 2020-06-15 DIAGNOSIS — M2042 Other hammer toe(s) (acquired), left foot: Secondary | ICD-10-CM | POA: Diagnosis not present

## 2020-06-15 DIAGNOSIS — N401 Enlarged prostate with lower urinary tract symptoms: Secondary | ICD-10-CM | POA: Diagnosis not present

## 2020-06-15 DIAGNOSIS — H2513 Age-related nuclear cataract, bilateral: Secondary | ICD-10-CM | POA: Diagnosis not present

## 2020-06-15 DIAGNOSIS — B351 Tinea unguium: Secondary | ICD-10-CM | POA: Diagnosis not present

## 2020-06-15 DIAGNOSIS — T8454XA Infection and inflammatory reaction due to internal left knee prosthesis, initial encounter: Secondary | ICD-10-CM | POA: Diagnosis not present

## 2020-06-15 DIAGNOSIS — A6002 Herpesviral infection of other male genital organs: Secondary | ICD-10-CM | POA: Diagnosis not present

## 2020-06-15 DIAGNOSIS — B1920 Unspecified viral hepatitis C without hepatic coma: Secondary | ICD-10-CM | POA: Diagnosis not present

## 2020-06-15 DIAGNOSIS — Z96659 Presence of unspecified artificial knee joint: Secondary | ICD-10-CM | POA: Diagnosis not present

## 2020-06-15 DIAGNOSIS — B353 Tinea pedis: Secondary | ICD-10-CM | POA: Diagnosis not present

## 2020-06-15 DIAGNOSIS — D849 Immunodeficiency, unspecified: Secondary | ICD-10-CM | POA: Diagnosis not present

## 2020-06-15 DIAGNOSIS — M2041 Other hammer toe(s) (acquired), right foot: Secondary | ICD-10-CM | POA: Diagnosis not present

## 2020-06-15 DIAGNOSIS — D649 Anemia, unspecified: Secondary | ICD-10-CM | POA: Diagnosis not present

## 2020-06-15 DIAGNOSIS — T8450XD Infection and inflammatory reaction due to unspecified internal joint prosthesis, subsequent encounter: Secondary | ICD-10-CM | POA: Diagnosis not present

## 2020-06-15 DIAGNOSIS — R5381 Other malaise: Secondary | ICD-10-CM | POA: Diagnosis not present

## 2020-06-15 DIAGNOSIS — Z23 Encounter for immunization: Secondary | ICD-10-CM | POA: Diagnosis not present

## 2020-06-15 DIAGNOSIS — B182 Chronic viral hepatitis C: Secondary | ICD-10-CM | POA: Diagnosis not present

## 2020-06-15 DIAGNOSIS — N400 Enlarged prostate without lower urinary tract symptoms: Secondary | ICD-10-CM | POA: Diagnosis not present

## 2020-06-15 DIAGNOSIS — Z96652 Presence of left artificial knee joint: Secondary | ICD-10-CM | POA: Diagnosis not present

## 2020-06-15 DIAGNOSIS — M1990 Unspecified osteoarthritis, unspecified site: Secondary | ICD-10-CM | POA: Diagnosis not present

## 2020-06-15 DIAGNOSIS — T8454XS Infection and inflammatory reaction due to internal left knee prosthesis, sequela: Secondary | ICD-10-CM | POA: Diagnosis not present

## 2020-06-15 DIAGNOSIS — H18413 Arcus senilis, bilateral: Secondary | ICD-10-CM | POA: Diagnosis not present

## 2020-06-15 DIAGNOSIS — E785 Hyperlipidemia, unspecified: Secondary | ICD-10-CM | POA: Diagnosis not present

## 2020-06-15 DIAGNOSIS — R52 Pain, unspecified: Secondary | ICD-10-CM | POA: Diagnosis not present

## 2020-06-15 DIAGNOSIS — M109 Gout, unspecified: Secondary | ICD-10-CM | POA: Diagnosis not present

## 2020-06-15 DIAGNOSIS — D8481 Immunodeficiency due to conditions classified elsewhere: Secondary | ICD-10-CM | POA: Diagnosis not present

## 2020-06-15 DIAGNOSIS — B20 Human immunodeficiency virus [HIV] disease: Secondary | ICD-10-CM | POA: Diagnosis not present

## 2020-06-18 DIAGNOSIS — J449 Chronic obstructive pulmonary disease, unspecified: Secondary | ICD-10-CM | POA: Diagnosis not present

## 2020-06-18 DIAGNOSIS — B20 Human immunodeficiency virus [HIV] disease: Secondary | ICD-10-CM | POA: Diagnosis not present

## 2020-06-18 DIAGNOSIS — T8459XD Infection and inflammatory reaction due to other internal joint prosthesis, subsequent encounter: Secondary | ICD-10-CM | POA: Diagnosis not present

## 2020-06-18 DIAGNOSIS — K219 Gastro-esophageal reflux disease without esophagitis: Secondary | ICD-10-CM | POA: Diagnosis not present

## 2020-06-18 DIAGNOSIS — N400 Enlarged prostate without lower urinary tract symptoms: Secondary | ICD-10-CM | POA: Diagnosis not present

## 2020-06-18 DIAGNOSIS — Z96659 Presence of unspecified artificial knee joint: Secondary | ICD-10-CM | POA: Diagnosis not present

## 2020-06-18 DIAGNOSIS — I10 Essential (primary) hypertension: Secondary | ICD-10-CM | POA: Diagnosis not present

## 2020-06-18 DIAGNOSIS — B1920 Unspecified viral hepatitis C without hepatic coma: Secondary | ICD-10-CM | POA: Diagnosis not present

## 2020-06-28 DIAGNOSIS — M25561 Pain in right knee: Secondary | ICD-10-CM | POA: Diagnosis not present

## 2020-07-03 DIAGNOSIS — B351 Tinea unguium: Secondary | ICD-10-CM | POA: Diagnosis not present

## 2020-07-03 DIAGNOSIS — M2042 Other hammer toe(s) (acquired), left foot: Secondary | ICD-10-CM | POA: Diagnosis not present

## 2020-07-03 DIAGNOSIS — M2041 Other hammer toe(s) (acquired), right foot: Secondary | ICD-10-CM | POA: Diagnosis not present

## 2020-07-04 DIAGNOSIS — B1920 Unspecified viral hepatitis C without hepatic coma: Secondary | ICD-10-CM | POA: Diagnosis not present

## 2020-07-04 DIAGNOSIS — I10 Essential (primary) hypertension: Secondary | ICD-10-CM | POA: Diagnosis not present

## 2020-07-04 DIAGNOSIS — Z79899 Other long term (current) drug therapy: Secondary | ICD-10-CM | POA: Diagnosis not present

## 2020-07-04 DIAGNOSIS — R52 Pain, unspecified: Secondary | ICD-10-CM | POA: Diagnosis not present

## 2020-07-04 DIAGNOSIS — B20 Human immunodeficiency virus [HIV] disease: Secondary | ICD-10-CM | POA: Diagnosis not present

## 2020-07-04 DIAGNOSIS — J449 Chronic obstructive pulmonary disease, unspecified: Secondary | ICD-10-CM | POA: Diagnosis not present

## 2020-07-12 ENCOUNTER — Other Ambulatory Visit: Payer: Self-pay | Admitting: *Deleted

## 2020-07-12 NOTE — Patient Outreach (Signed)
Member screened for potential [facility] Care Management needs.   Mr. Fonseca resides in [facility]. Update received from SNF SW indicating member will transition home. He has supportive friends that assist with everything. Likely to transition to home soon once facility has clarification on antibiotic stop date.   Will plan outreach to discuss [facility] Management follow up.    Marthenia Rolling, MSN, RN,BSN [place] Acute Care Coordinator [phone] [facility]) [phone]  (Toll free office)

## 2020-07-17 DIAGNOSIS — B182 Chronic viral hepatitis C: Secondary | ICD-10-CM | POA: Diagnosis not present

## 2020-07-17 DIAGNOSIS — T8454XS Infection and inflammatory reaction due to internal left knee prosthesis, sequela: Secondary | ICD-10-CM | POA: Diagnosis not present

## 2020-07-17 DIAGNOSIS — Z79899 Other long term (current) drug therapy: Secondary | ICD-10-CM | POA: Diagnosis not present

## 2020-07-17 DIAGNOSIS — B20 Human immunodeficiency virus [HIV] disease: Secondary | ICD-10-CM | POA: Diagnosis not present

## 2020-07-18 ENCOUNTER — Other Ambulatory Visit: Payer: Self-pay | Admitting: *Deleted

## 2020-07-18 NOTE — Patient Outreach (Signed)
[facility] Post- Acute Care Coordinator follow up. Member screened for potential [facility] Care Management needs.   Per [facility], Mr. Mutchler remains in [facility]. Telephone call made to Mr. Billey [phone]. No answer. HIPAA compliant voicemail message left. Telephone call made to [phone] person answered phone states it is the wrong number.   Will plan outreach again on member's listed mobile phone to discuss potential [facility] Management services.    Marthenia Rolling, MSN, RN,BSN [place] Acute Care Coordinator [phone] [facility]) [phone]  (Toll free office)

## 2020-07-27 DIAGNOSIS — H524 Presbyopia: Secondary | ICD-10-CM | POA: Diagnosis not present

## 2020-07-27 DIAGNOSIS — H2513 Age-related nuclear cataract, bilateral: Secondary | ICD-10-CM | POA: Diagnosis not present

## 2020-07-27 DIAGNOSIS — H18413 Arcus senilis, bilateral: Secondary | ICD-10-CM | POA: Diagnosis not present

## 2020-07-31 DIAGNOSIS — R52 Pain, unspecified: Secondary | ICD-10-CM | POA: Diagnosis not present

## 2020-07-31 DIAGNOSIS — Z79899 Other long term (current) drug therapy: Secondary | ICD-10-CM | POA: Diagnosis not present

## 2020-07-31 DIAGNOSIS — Z23 Encounter for immunization: Secondary | ICD-10-CM | POA: Diagnosis not present

## 2020-07-31 DIAGNOSIS — B20 Human immunodeficiency virus [HIV] disease: Secondary | ICD-10-CM | POA: Diagnosis not present

## 2020-07-31 DIAGNOSIS — T8459XD Infection and inflammatory reaction due to other internal joint prosthesis, subsequent encounter: Secondary | ICD-10-CM | POA: Diagnosis not present

## 2020-08-03 DIAGNOSIS — Z96652 Presence of left artificial knee joint: Secondary | ICD-10-CM | POA: Diagnosis not present

## 2020-08-05 DIAGNOSIS — Z20822 Contact with and (suspected) exposure to covid-19: Secondary | ICD-10-CM | POA: Diagnosis not present

## 2020-08-17 DIAGNOSIS — Z96659 Presence of unspecified artificial knee joint: Secondary | ICD-10-CM | POA: Diagnosis not present

## 2020-08-17 DIAGNOSIS — Z96652 Presence of left artificial knee joint: Secondary | ICD-10-CM | POA: Diagnosis not present

## 2020-08-17 DIAGNOSIS — M6281 Muscle weakness (generalized): Secondary | ICD-10-CM | POA: Diagnosis not present

## 2020-08-17 DIAGNOSIS — M25662 Stiffness of left knee, not elsewhere classified: Secondary | ICD-10-CM | POA: Diagnosis not present

## 2020-08-17 DIAGNOSIS — R2689 Other abnormalities of gait and mobility: Secondary | ICD-10-CM | POA: Diagnosis not present

## 2020-08-20 DIAGNOSIS — M25662 Stiffness of left knee, not elsewhere classified: Secondary | ICD-10-CM | POA: Diagnosis not present

## 2020-08-20 DIAGNOSIS — R2689 Other abnormalities of gait and mobility: Secondary | ICD-10-CM | POA: Diagnosis not present

## 2020-08-20 DIAGNOSIS — M6281 Muscle weakness (generalized): Secondary | ICD-10-CM | POA: Diagnosis not present

## 2020-08-20 DIAGNOSIS — Z96652 Presence of left artificial knee joint: Secondary | ICD-10-CM | POA: Diagnosis not present

## 2020-08-20 DIAGNOSIS — Z96659 Presence of unspecified artificial knee joint: Secondary | ICD-10-CM | POA: Diagnosis not present

## 2020-08-23 DIAGNOSIS — J449 Chronic obstructive pulmonary disease, unspecified: Secondary | ICD-10-CM | POA: Diagnosis not present

## 2020-08-23 DIAGNOSIS — K2100 Gastro-esophageal reflux disease with esophagitis, without bleeding: Secondary | ICD-10-CM | POA: Diagnosis not present

## 2020-08-23 DIAGNOSIS — I10 Essential (primary) hypertension: Secondary | ICD-10-CM | POA: Diagnosis not present

## 2020-08-23 DIAGNOSIS — N400 Enlarged prostate without lower urinary tract symptoms: Secondary | ICD-10-CM | POA: Diagnosis not present

## 2020-08-23 DIAGNOSIS — N182 Chronic kidney disease, stage 2 (mild): Secondary | ICD-10-CM | POA: Diagnosis not present

## 2020-08-24 DIAGNOSIS — R2689 Other abnormalities of gait and mobility: Secondary | ICD-10-CM | POA: Diagnosis not present

## 2020-08-24 DIAGNOSIS — M6281 Muscle weakness (generalized): Secondary | ICD-10-CM | POA: Diagnosis not present

## 2020-08-24 DIAGNOSIS — Z96652 Presence of left artificial knee joint: Secondary | ICD-10-CM | POA: Diagnosis not present

## 2020-08-24 DIAGNOSIS — Z96659 Presence of unspecified artificial knee joint: Secondary | ICD-10-CM | POA: Diagnosis not present

## 2020-08-24 DIAGNOSIS — M25662 Stiffness of left knee, not elsewhere classified: Secondary | ICD-10-CM | POA: Diagnosis not present

## 2020-08-27 DIAGNOSIS — M25662 Stiffness of left knee, not elsewhere classified: Secondary | ICD-10-CM | POA: Diagnosis not present

## 2020-08-27 DIAGNOSIS — R2689 Other abnormalities of gait and mobility: Secondary | ICD-10-CM | POA: Diagnosis not present

## 2020-08-27 DIAGNOSIS — Z96652 Presence of left artificial knee joint: Secondary | ICD-10-CM | POA: Diagnosis not present

## 2020-08-27 DIAGNOSIS — Z96659 Presence of unspecified artificial knee joint: Secondary | ICD-10-CM | POA: Diagnosis not present

## 2020-08-27 DIAGNOSIS — M6281 Muscle weakness (generalized): Secondary | ICD-10-CM | POA: Diagnosis not present

## 2020-08-29 DIAGNOSIS — R2689 Other abnormalities of gait and mobility: Secondary | ICD-10-CM | POA: Diagnosis not present

## 2020-08-29 DIAGNOSIS — Z96652 Presence of left artificial knee joint: Secondary | ICD-10-CM | POA: Diagnosis not present

## 2020-08-29 DIAGNOSIS — M25662 Stiffness of left knee, not elsewhere classified: Secondary | ICD-10-CM | POA: Diagnosis not present

## 2020-08-29 DIAGNOSIS — Z96659 Presence of unspecified artificial knee joint: Secondary | ICD-10-CM | POA: Diagnosis not present

## 2020-08-29 DIAGNOSIS — M6281 Muscle weakness (generalized): Secondary | ICD-10-CM | POA: Diagnosis not present

## 2020-09-05 DIAGNOSIS — R2689 Other abnormalities of gait and mobility: Secondary | ICD-10-CM | POA: Diagnosis not present

## 2020-09-05 DIAGNOSIS — Z96659 Presence of unspecified artificial knee joint: Secondary | ICD-10-CM | POA: Diagnosis not present

## 2020-09-05 DIAGNOSIS — M25662 Stiffness of left knee, not elsewhere classified: Secondary | ICD-10-CM | POA: Diagnosis not present

## 2020-09-05 DIAGNOSIS — Z96652 Presence of left artificial knee joint: Secondary | ICD-10-CM | POA: Diagnosis not present

## 2020-09-05 DIAGNOSIS — M6281 Muscle weakness (generalized): Secondary | ICD-10-CM | POA: Diagnosis not present

## 2020-09-07 DIAGNOSIS — M25662 Stiffness of left knee, not elsewhere classified: Secondary | ICD-10-CM | POA: Diagnosis not present

## 2020-09-07 DIAGNOSIS — R2689 Other abnormalities of gait and mobility: Secondary | ICD-10-CM | POA: Diagnosis not present

## 2020-09-07 DIAGNOSIS — Z96652 Presence of left artificial knee joint: Secondary | ICD-10-CM | POA: Diagnosis not present

## 2020-09-07 DIAGNOSIS — Z96659 Presence of unspecified artificial knee joint: Secondary | ICD-10-CM | POA: Diagnosis not present

## 2020-09-07 DIAGNOSIS — M6281 Muscle weakness (generalized): Secondary | ICD-10-CM | POA: Diagnosis not present

## 2020-09-10 DIAGNOSIS — M6281 Muscle weakness (generalized): Secondary | ICD-10-CM | POA: Diagnosis not present

## 2020-09-10 DIAGNOSIS — R2689 Other abnormalities of gait and mobility: Secondary | ICD-10-CM | POA: Diagnosis not present

## 2020-09-10 DIAGNOSIS — M25662 Stiffness of left knee, not elsewhere classified: Secondary | ICD-10-CM | POA: Diagnosis not present

## 2020-09-10 DIAGNOSIS — Z96659 Presence of unspecified artificial knee joint: Secondary | ICD-10-CM | POA: Diagnosis not present

## 2020-09-10 DIAGNOSIS — Z96652 Presence of left artificial knee joint: Secondary | ICD-10-CM | POA: Diagnosis not present

## 2020-09-17 DIAGNOSIS — M6281 Muscle weakness (generalized): Secondary | ICD-10-CM | POA: Diagnosis not present

## 2020-09-17 DIAGNOSIS — M25662 Stiffness of left knee, not elsewhere classified: Secondary | ICD-10-CM | POA: Diagnosis not present

## 2020-09-17 DIAGNOSIS — R2689 Other abnormalities of gait and mobility: Secondary | ICD-10-CM | POA: Diagnosis not present

## 2020-09-17 DIAGNOSIS — Z96659 Presence of unspecified artificial knee joint: Secondary | ICD-10-CM | POA: Diagnosis not present

## 2020-09-17 DIAGNOSIS — Z96652 Presence of left artificial knee joint: Secondary | ICD-10-CM | POA: Diagnosis not present

## 2020-09-18 DIAGNOSIS — B182 Chronic viral hepatitis C: Secondary | ICD-10-CM | POA: Diagnosis not present

## 2020-09-18 DIAGNOSIS — T8450XD Infection and inflammatory reaction due to unspecified internal joint prosthesis, subsequent encounter: Secondary | ICD-10-CM | POA: Diagnosis not present

## 2020-09-18 DIAGNOSIS — Z96652 Presence of left artificial knee joint: Secondary | ICD-10-CM | POA: Diagnosis not present

## 2020-09-18 DIAGNOSIS — B20 Human immunodeficiency virus [HIV] disease: Secondary | ICD-10-CM | POA: Diagnosis not present

## 2020-09-19 DIAGNOSIS — M25662 Stiffness of left knee, not elsewhere classified: Secondary | ICD-10-CM | POA: Diagnosis not present

## 2020-09-19 DIAGNOSIS — R2689 Other abnormalities of gait and mobility: Secondary | ICD-10-CM | POA: Diagnosis not present

## 2020-09-19 DIAGNOSIS — M6281 Muscle weakness (generalized): Secondary | ICD-10-CM | POA: Diagnosis not present

## 2020-09-19 DIAGNOSIS — Z96659 Presence of unspecified artificial knee joint: Secondary | ICD-10-CM | POA: Diagnosis not present

## 2020-09-19 DIAGNOSIS — Z96652 Presence of left artificial knee joint: Secondary | ICD-10-CM | POA: Diagnosis not present

## 2020-09-25 DIAGNOSIS — M7918 Myalgia, other site: Secondary | ICD-10-CM | POA: Diagnosis not present

## 2020-09-25 DIAGNOSIS — M6281 Muscle weakness (generalized): Secondary | ICD-10-CM | POA: Diagnosis not present

## 2020-09-25 DIAGNOSIS — R2689 Other abnormalities of gait and mobility: Secondary | ICD-10-CM | POA: Diagnosis not present

## 2020-09-25 DIAGNOSIS — Z96652 Presence of left artificial knee joint: Secondary | ICD-10-CM | POA: Diagnosis not present

## 2020-09-25 DIAGNOSIS — M25662 Stiffness of left knee, not elsewhere classified: Secondary | ICD-10-CM | POA: Diagnosis not present

## 2020-09-25 DIAGNOSIS — Z96659 Presence of unspecified artificial knee joint: Secondary | ICD-10-CM | POA: Diagnosis not present

## 2020-09-27 DIAGNOSIS — M6281 Muscle weakness (generalized): Secondary | ICD-10-CM | POA: Diagnosis not present

## 2020-09-27 DIAGNOSIS — M25662 Stiffness of left knee, not elsewhere classified: Secondary | ICD-10-CM | POA: Diagnosis not present

## 2020-09-27 DIAGNOSIS — Z96652 Presence of left artificial knee joint: Secondary | ICD-10-CM | POA: Diagnosis not present

## 2020-09-27 DIAGNOSIS — Z96659 Presence of unspecified artificial knee joint: Secondary | ICD-10-CM | POA: Diagnosis not present

## 2020-09-27 DIAGNOSIS — R2689 Other abnormalities of gait and mobility: Secondary | ICD-10-CM | POA: Diagnosis not present

## 2020-10-01 DIAGNOSIS — R2689 Other abnormalities of gait and mobility: Secondary | ICD-10-CM | POA: Diagnosis not present

## 2020-10-01 DIAGNOSIS — M25662 Stiffness of left knee, not elsewhere classified: Secondary | ICD-10-CM | POA: Diagnosis not present

## 2020-10-01 DIAGNOSIS — Z96652 Presence of left artificial knee joint: Secondary | ICD-10-CM | POA: Diagnosis not present

## 2020-10-01 DIAGNOSIS — Z96659 Presence of unspecified artificial knee joint: Secondary | ICD-10-CM | POA: Diagnosis not present

## 2020-10-01 DIAGNOSIS — M6281 Muscle weakness (generalized): Secondary | ICD-10-CM | POA: Diagnosis not present

## 2020-10-03 DIAGNOSIS — R2689 Other abnormalities of gait and mobility: Secondary | ICD-10-CM | POA: Diagnosis not present

## 2020-10-03 DIAGNOSIS — M6281 Muscle weakness (generalized): Secondary | ICD-10-CM | POA: Diagnosis not present

## 2020-10-03 DIAGNOSIS — Z96652 Presence of left artificial knee joint: Secondary | ICD-10-CM | POA: Diagnosis not present

## 2020-10-03 DIAGNOSIS — M25662 Stiffness of left knee, not elsewhere classified: Secondary | ICD-10-CM | POA: Diagnosis not present

## 2020-10-03 DIAGNOSIS — Z96659 Presence of unspecified artificial knee joint: Secondary | ICD-10-CM | POA: Diagnosis not present

## 2020-10-11 DIAGNOSIS — R2689 Other abnormalities of gait and mobility: Secondary | ICD-10-CM | POA: Diagnosis not present

## 2020-10-11 DIAGNOSIS — M6281 Muscle weakness (generalized): Secondary | ICD-10-CM | POA: Diagnosis not present

## 2020-10-11 DIAGNOSIS — Z96659 Presence of unspecified artificial knee joint: Secondary | ICD-10-CM | POA: Diagnosis not present

## 2020-10-11 DIAGNOSIS — M25662 Stiffness of left knee, not elsewhere classified: Secondary | ICD-10-CM | POA: Diagnosis not present

## 2020-10-11 DIAGNOSIS — Z96652 Presence of left artificial knee joint: Secondary | ICD-10-CM | POA: Diagnosis not present

## 2020-10-12 DIAGNOSIS — R2689 Other abnormalities of gait and mobility: Secondary | ICD-10-CM | POA: Diagnosis not present

## 2020-10-12 DIAGNOSIS — M6281 Muscle weakness (generalized): Secondary | ICD-10-CM | POA: Diagnosis not present

## 2020-10-12 DIAGNOSIS — M25662 Stiffness of left knee, not elsewhere classified: Secondary | ICD-10-CM | POA: Diagnosis not present

## 2020-10-12 DIAGNOSIS — Z96652 Presence of left artificial knee joint: Secondary | ICD-10-CM | POA: Diagnosis not present

## 2020-10-12 DIAGNOSIS — Z96659 Presence of unspecified artificial knee joint: Secondary | ICD-10-CM | POA: Diagnosis not present

## 2020-11-22 DIAGNOSIS — H524 Presbyopia: Secondary | ICD-10-CM | POA: Diagnosis not present

## 2020-11-26 DIAGNOSIS — I10 Essential (primary) hypertension: Secondary | ICD-10-CM | POA: Diagnosis not present

## 2020-11-26 DIAGNOSIS — K2100 Gastro-esophageal reflux disease with esophagitis, without bleeding: Secondary | ICD-10-CM | POA: Diagnosis not present

## 2020-11-26 DIAGNOSIS — N182 Chronic kidney disease, stage 2 (mild): Secondary | ICD-10-CM | POA: Diagnosis not present

## 2020-11-26 DIAGNOSIS — N400 Enlarged prostate without lower urinary tract symptoms: Secondary | ICD-10-CM | POA: Diagnosis not present

## 2020-11-26 DIAGNOSIS — Z0000 Encounter for general adult medical examination without abnormal findings: Secondary | ICD-10-CM | POA: Diagnosis not present

## 2020-11-26 DIAGNOSIS — Z23 Encounter for immunization: Secondary | ICD-10-CM | POA: Diagnosis not present

## 2020-11-26 DIAGNOSIS — Z1331 Encounter for screening for depression: Secondary | ICD-10-CM | POA: Diagnosis not present

## 2020-11-30 DIAGNOSIS — E782 Mixed hyperlipidemia: Secondary | ICD-10-CM | POA: Diagnosis not present

## 2020-11-30 DIAGNOSIS — Z79899 Other long term (current) drug therapy: Secondary | ICD-10-CM | POA: Diagnosis not present

## 2020-11-30 DIAGNOSIS — Z125 Encounter for screening for malignant neoplasm of prostate: Secondary | ICD-10-CM | POA: Diagnosis not present

## 2021-01-18 DIAGNOSIS — R7989 Other specified abnormal findings of blood chemistry: Secondary | ICD-10-CM | POA: Diagnosis not present

## 2021-01-18 DIAGNOSIS — T8454XS Infection and inflammatory reaction due to internal left knee prosthesis, sequela: Secondary | ICD-10-CM | POA: Diagnosis not present

## 2021-01-18 DIAGNOSIS — R799 Abnormal finding of blood chemistry, unspecified: Secondary | ICD-10-CM | POA: Diagnosis not present

## 2021-01-18 DIAGNOSIS — B20 Human immunodeficiency virus [HIV] disease: Secondary | ICD-10-CM | POA: Diagnosis not present

## 2021-01-18 DIAGNOSIS — M545 Low back pain, unspecified: Secondary | ICD-10-CM | POA: Diagnosis not present

## 2021-01-18 DIAGNOSIS — R809 Proteinuria, unspecified: Secondary | ICD-10-CM | POA: Diagnosis not present

## 2021-01-18 DIAGNOSIS — M25512 Pain in left shoulder: Secondary | ICD-10-CM | POA: Diagnosis not present

## 2021-01-18 DIAGNOSIS — G8929 Other chronic pain: Secondary | ICD-10-CM | POA: Diagnosis not present

## 2021-01-18 DIAGNOSIS — B182 Chronic viral hepatitis C: Secondary | ICD-10-CM | POA: Diagnosis not present

## 2021-02-21 DIAGNOSIS — M25561 Pain in right knee: Secondary | ICD-10-CM | POA: Diagnosis not present

## 2021-02-21 DIAGNOSIS — Z96652 Presence of left artificial knee joint: Secondary | ICD-10-CM | POA: Diagnosis not present

## 2021-02-22 DIAGNOSIS — Z20822 Contact with and (suspected) exposure to covid-19: Secondary | ICD-10-CM | POA: Diagnosis not present

## 2021-02-28 DIAGNOSIS — K2100 Gastro-esophageal reflux disease with esophagitis, without bleeding: Secondary | ICD-10-CM | POA: Diagnosis not present

## 2021-02-28 DIAGNOSIS — N182 Chronic kidney disease, stage 2 (mild): Secondary | ICD-10-CM | POA: Diagnosis not present

## 2021-02-28 DIAGNOSIS — N400 Enlarged prostate without lower urinary tract symptoms: Secondary | ICD-10-CM | POA: Diagnosis not present

## 2021-02-28 DIAGNOSIS — I10 Essential (primary) hypertension: Secondary | ICD-10-CM | POA: Diagnosis not present

## 2021-03-15 DIAGNOSIS — Z20822 Contact with and (suspected) exposure to covid-19: Secondary | ICD-10-CM | POA: Diagnosis not present

## 2021-03-22 DIAGNOSIS — Z20822 Contact with and (suspected) exposure to covid-19: Secondary | ICD-10-CM | POA: Diagnosis not present

## 2021-04-01 DIAGNOSIS — M25769 Osteophyte, unspecified knee: Secondary | ICD-10-CM | POA: Diagnosis not present

## 2021-05-20 DIAGNOSIS — Z20822 Contact with and (suspected) exposure to covid-19: Secondary | ICD-10-CM | POA: Diagnosis not present

## 2021-05-29 DIAGNOSIS — Z20822 Contact with and (suspected) exposure to covid-19: Secondary | ICD-10-CM | POA: Diagnosis not present

## 2021-05-29 DIAGNOSIS — M1711 Unilateral primary osteoarthritis, right knee: Secondary | ICD-10-CM | POA: Diagnosis not present

## 2021-05-29 DIAGNOSIS — R051 Acute cough: Secondary | ICD-10-CM | POA: Diagnosis not present

## 2021-05-29 DIAGNOSIS — Z96652 Presence of left artificial knee joint: Secondary | ICD-10-CM | POA: Diagnosis not present

## 2021-05-29 DIAGNOSIS — R059 Cough, unspecified: Secondary | ICD-10-CM | POA: Diagnosis not present

## 2021-06-09 DIAGNOSIS — Z20822 Contact with and (suspected) exposure to covid-19: Secondary | ICD-10-CM | POA: Diagnosis not present

## 2021-06-10 DIAGNOSIS — I10 Essential (primary) hypertension: Secondary | ICD-10-CM | POA: Diagnosis not present

## 2021-06-10 DIAGNOSIS — M25769 Osteophyte, unspecified knee: Secondary | ICD-10-CM | POA: Diagnosis not present

## 2021-06-10 DIAGNOSIS — N400 Enlarged prostate without lower urinary tract symptoms: Secondary | ICD-10-CM | POA: Diagnosis not present

## 2021-06-10 DIAGNOSIS — Z20822 Contact with and (suspected) exposure to covid-19: Secondary | ICD-10-CM | POA: Diagnosis not present

## 2021-06-10 DIAGNOSIS — N182 Chronic kidney disease, stage 2 (mild): Secondary | ICD-10-CM | POA: Diagnosis not present

## 2021-06-10 DIAGNOSIS — J449 Chronic obstructive pulmonary disease, unspecified: Secondary | ICD-10-CM | POA: Diagnosis not present

## 2021-06-27 DIAGNOSIS — M25461 Effusion, right knee: Secondary | ICD-10-CM | POA: Diagnosis not present

## 2021-07-19 DIAGNOSIS — Z79899 Other long term (current) drug therapy: Secondary | ICD-10-CM | POA: Diagnosis not present

## 2021-07-19 DIAGNOSIS — B20 Human immunodeficiency virus [HIV] disease: Secondary | ICD-10-CM | POA: Diagnosis not present

## 2021-07-19 DIAGNOSIS — B182 Chronic viral hepatitis C: Secondary | ICD-10-CM | POA: Diagnosis not present

## 2021-07-19 DIAGNOSIS — T8454XS Infection and inflammatory reaction due to internal left knee prosthesis, sequela: Secondary | ICD-10-CM | POA: Diagnosis not present

## 2021-07-19 DIAGNOSIS — R799 Abnormal finding of blood chemistry, unspecified: Secondary | ICD-10-CM | POA: Diagnosis not present

## 2021-08-22 DIAGNOSIS — M1711 Unilateral primary osteoarthritis, right knee: Secondary | ICD-10-CM | POA: Diagnosis not present

## 2021-09-09 DIAGNOSIS — M25769 Osteophyte, unspecified knee: Secondary | ICD-10-CM | POA: Diagnosis not present

## 2021-09-09 DIAGNOSIS — I10 Essential (primary) hypertension: Secondary | ICD-10-CM | POA: Diagnosis not present

## 2021-09-09 DIAGNOSIS — K219 Gastro-esophageal reflux disease without esophagitis: Secondary | ICD-10-CM | POA: Diagnosis not present

## 2021-11-21 DIAGNOSIS — Z96652 Presence of left artificial knee joint: Secondary | ICD-10-CM | POA: Diagnosis not present

## 2021-11-21 DIAGNOSIS — M1711 Unilateral primary osteoarthritis, right knee: Secondary | ICD-10-CM | POA: Diagnosis not present

## 2021-12-12 DIAGNOSIS — Z125 Encounter for screening for malignant neoplasm of prostate: Secondary | ICD-10-CM | POA: Diagnosis not present

## 2021-12-12 DIAGNOSIS — M25769 Osteophyte, unspecified knee: Secondary | ICD-10-CM | POA: Diagnosis not present

## 2021-12-12 DIAGNOSIS — K219 Gastro-esophageal reflux disease without esophagitis: Secondary | ICD-10-CM | POA: Diagnosis not present

## 2021-12-12 DIAGNOSIS — I10 Essential (primary) hypertension: Secondary | ICD-10-CM | POA: Diagnosis not present

## 2021-12-16 DIAGNOSIS — Z23 Encounter for immunization: Secondary | ICD-10-CM | POA: Diagnosis not present

## 2022-02-21 DIAGNOSIS — Z96652 Presence of left artificial knee joint: Secondary | ICD-10-CM | POA: Diagnosis not present

## 2022-02-21 DIAGNOSIS — M1711 Unilateral primary osteoarthritis, right knee: Secondary | ICD-10-CM | POA: Diagnosis not present

## 2022-03-11 DIAGNOSIS — Z792 Long term (current) use of antibiotics: Secondary | ICD-10-CM | POA: Diagnosis not present

## 2022-03-11 DIAGNOSIS — T8450XD Infection and inflammatory reaction due to unspecified internal joint prosthesis, subsequent encounter: Secondary | ICD-10-CM | POA: Diagnosis not present

## 2022-03-11 DIAGNOSIS — T8454XD Infection and inflammatory reaction due to internal left knee prosthesis, subsequent encounter: Secondary | ICD-10-CM | POA: Diagnosis not present

## 2022-03-11 DIAGNOSIS — B182 Chronic viral hepatitis C: Secondary | ICD-10-CM | POA: Diagnosis not present

## 2022-03-11 DIAGNOSIS — Z79899 Other long term (current) drug therapy: Secondary | ICD-10-CM | POA: Diagnosis not present

## 2022-03-11 DIAGNOSIS — B20 Human immunodeficiency virus [HIV] disease: Secondary | ICD-10-CM | POA: Diagnosis not present

## 2022-03-11 DIAGNOSIS — B1920 Unspecified viral hepatitis C without hepatic coma: Secondary | ICD-10-CM | POA: Diagnosis not present

## 2022-03-11 DIAGNOSIS — R7989 Other specified abnormal findings of blood chemistry: Secondary | ICD-10-CM | POA: Diagnosis not present

## 2022-03-13 DIAGNOSIS — M25769 Osteophyte, unspecified knee: Secondary | ICD-10-CM | POA: Diagnosis not present

## 2022-03-13 DIAGNOSIS — B20 Human immunodeficiency virus [HIV] disease: Secondary | ICD-10-CM | POA: Diagnosis not present

## 2022-03-13 DIAGNOSIS — J449 Chronic obstructive pulmonary disease, unspecified: Secondary | ICD-10-CM | POA: Diagnosis not present

## 2022-03-13 DIAGNOSIS — K219 Gastro-esophageal reflux disease without esophagitis: Secondary | ICD-10-CM | POA: Diagnosis not present

## 2022-03-13 DIAGNOSIS — N400 Enlarged prostate without lower urinary tract symptoms: Secondary | ICD-10-CM | POA: Diagnosis not present

## 2022-03-13 DIAGNOSIS — Z0000 Encounter for general adult medical examination without abnormal findings: Secondary | ICD-10-CM | POA: Diagnosis not present

## 2022-03-13 DIAGNOSIS — I10 Essential (primary) hypertension: Secondary | ICD-10-CM | POA: Diagnosis not present

## 2022-03-13 DIAGNOSIS — Z1331 Encounter for screening for depression: Secondary | ICD-10-CM | POA: Diagnosis not present

## 2022-03-18 DIAGNOSIS — F17210 Nicotine dependence, cigarettes, uncomplicated: Secondary | ICD-10-CM | POA: Diagnosis not present

## 2022-03-18 DIAGNOSIS — E785 Hyperlipidemia, unspecified: Secondary | ICD-10-CM | POA: Diagnosis not present

## 2022-03-18 DIAGNOSIS — M25511 Pain in right shoulder: Secondary | ICD-10-CM | POA: Diagnosis not present

## 2022-03-18 DIAGNOSIS — K219 Gastro-esophageal reflux disease without esophagitis: Secondary | ICD-10-CM | POA: Diagnosis not present

## 2022-03-18 DIAGNOSIS — M19011 Primary osteoarthritis, right shoulder: Secondary | ICD-10-CM | POA: Diagnosis not present

## 2022-03-18 DIAGNOSIS — I10 Essential (primary) hypertension: Secondary | ICD-10-CM | POA: Diagnosis not present

## 2022-03-18 DIAGNOSIS — N400 Enlarged prostate without lower urinary tract symptoms: Secondary | ICD-10-CM | POA: Diagnosis not present

## 2022-03-18 DIAGNOSIS — B20 Human immunodeficiency virus [HIV] disease: Secondary | ICD-10-CM | POA: Diagnosis not present

## 2022-03-18 DIAGNOSIS — J449 Chronic obstructive pulmonary disease, unspecified: Secondary | ICD-10-CM | POA: Diagnosis not present

## 2022-03-25 DIAGNOSIS — M19011 Primary osteoarthritis, right shoulder: Secondary | ICD-10-CM | POA: Diagnosis not present

## 2022-03-25 DIAGNOSIS — J449 Chronic obstructive pulmonary disease, unspecified: Secondary | ICD-10-CM | POA: Diagnosis not present

## 2022-03-25 DIAGNOSIS — M25511 Pain in right shoulder: Secondary | ICD-10-CM | POA: Diagnosis not present

## 2022-03-25 DIAGNOSIS — I10 Essential (primary) hypertension: Secondary | ICD-10-CM | POA: Diagnosis not present

## 2022-03-25 DIAGNOSIS — N400 Enlarged prostate without lower urinary tract symptoms: Secondary | ICD-10-CM | POA: Diagnosis not present

## 2022-03-25 DIAGNOSIS — B20 Human immunodeficiency virus [HIV] disease: Secondary | ICD-10-CM | POA: Diagnosis not present

## 2022-03-26 DIAGNOSIS — M25511 Pain in right shoulder: Secondary | ICD-10-CM | POA: Diagnosis not present

## 2022-03-26 DIAGNOSIS — B20 Human immunodeficiency virus [HIV] disease: Secondary | ICD-10-CM | POA: Diagnosis not present

## 2022-03-26 DIAGNOSIS — J449 Chronic obstructive pulmonary disease, unspecified: Secondary | ICD-10-CM | POA: Diagnosis not present

## 2022-03-26 DIAGNOSIS — N400 Enlarged prostate without lower urinary tract symptoms: Secondary | ICD-10-CM | POA: Diagnosis not present

## 2022-03-26 DIAGNOSIS — I10 Essential (primary) hypertension: Secondary | ICD-10-CM | POA: Diagnosis not present

## 2022-03-26 DIAGNOSIS — M19011 Primary osteoarthritis, right shoulder: Secondary | ICD-10-CM | POA: Diagnosis not present

## 2022-03-28 DIAGNOSIS — B20 Human immunodeficiency virus [HIV] disease: Secondary | ICD-10-CM | POA: Diagnosis not present

## 2022-03-28 DIAGNOSIS — J449 Chronic obstructive pulmonary disease, unspecified: Secondary | ICD-10-CM | POA: Diagnosis not present

## 2022-03-28 DIAGNOSIS — M25511 Pain in right shoulder: Secondary | ICD-10-CM | POA: Diagnosis not present

## 2022-03-28 DIAGNOSIS — N400 Enlarged prostate without lower urinary tract symptoms: Secondary | ICD-10-CM | POA: Diagnosis not present

## 2022-03-28 DIAGNOSIS — M19011 Primary osteoarthritis, right shoulder: Secondary | ICD-10-CM | POA: Diagnosis not present

## 2022-03-28 DIAGNOSIS — I10 Essential (primary) hypertension: Secondary | ICD-10-CM | POA: Diagnosis not present

## 2022-04-02 DIAGNOSIS — M19011 Primary osteoarthritis, right shoulder: Secondary | ICD-10-CM | POA: Diagnosis not present

## 2022-04-02 DIAGNOSIS — M25511 Pain in right shoulder: Secondary | ICD-10-CM | POA: Diagnosis not present

## 2022-04-02 DIAGNOSIS — B20 Human immunodeficiency virus [HIV] disease: Secondary | ICD-10-CM | POA: Diagnosis not present

## 2022-04-02 DIAGNOSIS — N400 Enlarged prostate without lower urinary tract symptoms: Secondary | ICD-10-CM | POA: Diagnosis not present

## 2022-04-02 DIAGNOSIS — I10 Essential (primary) hypertension: Secondary | ICD-10-CM | POA: Diagnosis not present

## 2022-04-02 DIAGNOSIS — J449 Chronic obstructive pulmonary disease, unspecified: Secondary | ICD-10-CM | POA: Diagnosis not present

## 2022-04-04 DIAGNOSIS — J449 Chronic obstructive pulmonary disease, unspecified: Secondary | ICD-10-CM | POA: Diagnosis not present

## 2022-04-04 DIAGNOSIS — M25511 Pain in right shoulder: Secondary | ICD-10-CM | POA: Diagnosis not present

## 2022-04-04 DIAGNOSIS — M19011 Primary osteoarthritis, right shoulder: Secondary | ICD-10-CM | POA: Diagnosis not present

## 2022-04-04 DIAGNOSIS — I10 Essential (primary) hypertension: Secondary | ICD-10-CM | POA: Diagnosis not present

## 2022-04-04 DIAGNOSIS — N400 Enlarged prostate without lower urinary tract symptoms: Secondary | ICD-10-CM | POA: Diagnosis not present

## 2022-04-04 DIAGNOSIS — B20 Human immunodeficiency virus [HIV] disease: Secondary | ICD-10-CM | POA: Diagnosis not present

## 2022-04-07 DIAGNOSIS — M25511 Pain in right shoulder: Secondary | ICD-10-CM | POA: Diagnosis not present

## 2022-04-07 DIAGNOSIS — B20 Human immunodeficiency virus [HIV] disease: Secondary | ICD-10-CM | POA: Diagnosis not present

## 2022-04-07 DIAGNOSIS — N400 Enlarged prostate without lower urinary tract symptoms: Secondary | ICD-10-CM | POA: Diagnosis not present

## 2022-04-07 DIAGNOSIS — M19011 Primary osteoarthritis, right shoulder: Secondary | ICD-10-CM | POA: Diagnosis not present

## 2022-04-07 DIAGNOSIS — I10 Essential (primary) hypertension: Secondary | ICD-10-CM | POA: Diagnosis not present

## 2022-04-07 DIAGNOSIS — J449 Chronic obstructive pulmonary disease, unspecified: Secondary | ICD-10-CM | POA: Diagnosis not present

## 2022-04-10 DIAGNOSIS — J449 Chronic obstructive pulmonary disease, unspecified: Secondary | ICD-10-CM | POA: Diagnosis not present

## 2022-04-10 DIAGNOSIS — B20 Human immunodeficiency virus [HIV] disease: Secondary | ICD-10-CM | POA: Diagnosis not present

## 2022-04-10 DIAGNOSIS — N400 Enlarged prostate without lower urinary tract symptoms: Secondary | ICD-10-CM | POA: Diagnosis not present

## 2022-04-10 DIAGNOSIS — I10 Essential (primary) hypertension: Secondary | ICD-10-CM | POA: Diagnosis not present

## 2022-04-10 DIAGNOSIS — M25511 Pain in right shoulder: Secondary | ICD-10-CM | POA: Diagnosis not present

## 2022-04-10 DIAGNOSIS — M19011 Primary osteoarthritis, right shoulder: Secondary | ICD-10-CM | POA: Diagnosis not present

## 2022-04-17 DIAGNOSIS — F17210 Nicotine dependence, cigarettes, uncomplicated: Secondary | ICD-10-CM | POA: Diagnosis not present

## 2022-04-17 DIAGNOSIS — I10 Essential (primary) hypertension: Secondary | ICD-10-CM | POA: Diagnosis not present

## 2022-04-17 DIAGNOSIS — K219 Gastro-esophageal reflux disease without esophagitis: Secondary | ICD-10-CM | POA: Diagnosis not present

## 2022-04-17 DIAGNOSIS — N400 Enlarged prostate without lower urinary tract symptoms: Secondary | ICD-10-CM | POA: Diagnosis not present

## 2022-04-17 DIAGNOSIS — M25511 Pain in right shoulder: Secondary | ICD-10-CM | POA: Diagnosis not present

## 2022-04-17 DIAGNOSIS — B20 Human immunodeficiency virus [HIV] disease: Secondary | ICD-10-CM | POA: Diagnosis not present

## 2022-04-17 DIAGNOSIS — M19011 Primary osteoarthritis, right shoulder: Secondary | ICD-10-CM | POA: Diagnosis not present

## 2022-04-17 DIAGNOSIS — J449 Chronic obstructive pulmonary disease, unspecified: Secondary | ICD-10-CM | POA: Diagnosis not present

## 2022-04-17 DIAGNOSIS — E785 Hyperlipidemia, unspecified: Secondary | ICD-10-CM | POA: Diagnosis not present

## 2022-04-18 DIAGNOSIS — B20 Human immunodeficiency virus [HIV] disease: Secondary | ICD-10-CM | POA: Diagnosis not present

## 2022-04-18 DIAGNOSIS — I10 Essential (primary) hypertension: Secondary | ICD-10-CM | POA: Diagnosis not present

## 2022-04-18 DIAGNOSIS — M19011 Primary osteoarthritis, right shoulder: Secondary | ICD-10-CM | POA: Diagnosis not present

## 2022-04-18 DIAGNOSIS — N400 Enlarged prostate without lower urinary tract symptoms: Secondary | ICD-10-CM | POA: Diagnosis not present

## 2022-04-18 DIAGNOSIS — M25511 Pain in right shoulder: Secondary | ICD-10-CM | POA: Diagnosis not present

## 2022-04-18 DIAGNOSIS — J449 Chronic obstructive pulmonary disease, unspecified: Secondary | ICD-10-CM | POA: Diagnosis not present

## 2022-04-23 DIAGNOSIS — I10 Essential (primary) hypertension: Secondary | ICD-10-CM | POA: Diagnosis not present

## 2022-04-23 DIAGNOSIS — J449 Chronic obstructive pulmonary disease, unspecified: Secondary | ICD-10-CM | POA: Diagnosis not present

## 2022-04-23 DIAGNOSIS — N400 Enlarged prostate without lower urinary tract symptoms: Secondary | ICD-10-CM | POA: Diagnosis not present

## 2022-04-23 DIAGNOSIS — M19011 Primary osteoarthritis, right shoulder: Secondary | ICD-10-CM | POA: Diagnosis not present

## 2022-04-23 DIAGNOSIS — B20 Human immunodeficiency virus [HIV] disease: Secondary | ICD-10-CM | POA: Diagnosis not present

## 2022-04-23 DIAGNOSIS — M25511 Pain in right shoulder: Secondary | ICD-10-CM | POA: Diagnosis not present

## 2022-05-09 DIAGNOSIS — M19011 Primary osteoarthritis, right shoulder: Secondary | ICD-10-CM | POA: Diagnosis not present

## 2022-05-09 DIAGNOSIS — N400 Enlarged prostate without lower urinary tract symptoms: Secondary | ICD-10-CM | POA: Diagnosis not present

## 2022-05-09 DIAGNOSIS — I10 Essential (primary) hypertension: Secondary | ICD-10-CM | POA: Diagnosis not present

## 2022-05-09 DIAGNOSIS — B20 Human immunodeficiency virus [HIV] disease: Secondary | ICD-10-CM | POA: Diagnosis not present

## 2022-05-09 DIAGNOSIS — M25511 Pain in right shoulder: Secondary | ICD-10-CM | POA: Diagnosis not present

## 2022-05-09 DIAGNOSIS — J449 Chronic obstructive pulmonary disease, unspecified: Secondary | ICD-10-CM | POA: Diagnosis not present

## 2022-05-15 DIAGNOSIS — N400 Enlarged prostate without lower urinary tract symptoms: Secondary | ICD-10-CM | POA: Diagnosis not present

## 2022-05-15 DIAGNOSIS — M25511 Pain in right shoulder: Secondary | ICD-10-CM | POA: Diagnosis not present

## 2022-05-15 DIAGNOSIS — B20 Human immunodeficiency virus [HIV] disease: Secondary | ICD-10-CM | POA: Diagnosis not present

## 2022-05-15 DIAGNOSIS — I10 Essential (primary) hypertension: Secondary | ICD-10-CM | POA: Diagnosis not present

## 2022-05-15 DIAGNOSIS — J449 Chronic obstructive pulmonary disease, unspecified: Secondary | ICD-10-CM | POA: Diagnosis not present

## 2022-05-15 DIAGNOSIS — M19011 Primary osteoarthritis, right shoulder: Secondary | ICD-10-CM | POA: Diagnosis not present

## 2022-05-27 DIAGNOSIS — M1711 Unilateral primary osteoarthritis, right knee: Secondary | ICD-10-CM | POA: Diagnosis not present

## 2022-05-27 DIAGNOSIS — M25561 Pain in right knee: Secondary | ICD-10-CM | POA: Diagnosis not present
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
## 2022-06-10 DIAGNOSIS — Z1331 Encounter for screening for depression: Secondary | ICD-10-CM | POA: Diagnosis not present

## 2022-06-10 DIAGNOSIS — I10 Essential (primary) hypertension: Secondary | ICD-10-CM | POA: Diagnosis not present

## 2022-06-10 DIAGNOSIS — B20 Human immunodeficiency virus [HIV] disease: Secondary | ICD-10-CM | POA: Diagnosis not present

## 2022-06-10 DIAGNOSIS — N400 Enlarged prostate without lower urinary tract symptoms: Secondary | ICD-10-CM | POA: Diagnosis not present

## 2022-06-10 DIAGNOSIS — J449 Chronic obstructive pulmonary disease, unspecified: Secondary | ICD-10-CM | POA: Diagnosis not present

## 2022-06-10 DIAGNOSIS — Z0000 Encounter for general adult medical examination without abnormal findings: Secondary | ICD-10-CM | POA: Diagnosis not present

## 2022-06-10 DIAGNOSIS — M24661 Ankylosis, right knee: Secondary | ICD-10-CM | POA: Diagnosis not present

## 2022-06-10 DIAGNOSIS — M25769 Osteophyte, unspecified knee: Secondary | ICD-10-CM | POA: Diagnosis not present

## 2022-06-10 DIAGNOSIS — K219 Gastro-esophageal reflux disease without esophagitis: Secondary | ICD-10-CM | POA: Diagnosis not present

## 2022-06-10 NOTE — Patient Instructions (Signed)
SURGICAL WAITING ROOM VISITATION  Patients having surgery or a procedure may have no more than 2 support people in the waiting area - these visitors may rotate.    Children under the age of 55 must have an adult with them who is not the patient.  Due to an increase in RSV and influenza rates and associated hospitalizations, children ages 46 and under may not visit patients in [facility] hospitals.  If the patient needs to stay at the hospital during part of their recovery, the visitor guidelines for inpatient rooms apply. Pre-op nurse will coordinate an appropriate time for 1 support person to accompany patient in pre-op.  This support person may not rotate.    Please refer to the [facility] website for the visitor guidelines for Inpatients (after your surgery is over and you are in a regular room).       Your procedure is scheduled on:  06/23/2022    Report to [facility] Main Entrance    Report to admitting at    1115AM   Call this number if you have problems the morning of surgery [phone]   Do not eat food :After Midnight.   After Midnight you may have the following liquids until _ 1045_____ AM  DAY OF SURGERY  Water Non-Citrus Juices (without pulp, NO RED-Apple, White grape, White cranberry) Black Coffee (NO MILK/CREAM OR CREAMERS, sugar ok)  Clear Tea (NO MILK/CREAM OR CREAMERS, sugar ok) regular and decaf                             Plain Jell-O (NO RED)                                           Fruit ices (not with fruit pulp, NO RED)                                     Popsicles (NO RED)                                                               Sports drinks like Gatorade (NO RED)                     The day of surgery:  Drink ONE (1) Pre-Surgery Clear Ensure or G2 at    1045 AM  ( have completed by ) the morning of surgery. Drink in one sitting. Do not sip.  This drink was given to you during your hospital  pre-op appointment visit. Nothing else  to drink after completing the  Pre-Surgery Clear Ensure or G2.          If you have questions, please contact your surgeon's office.       Oral Hygiene is also important to reduce your risk of infection.                                    Remember - BRUSH YOUR TEETH  THE MORNING OF SURGERY WITH YOUR REGULAR TOOTHPASTE  DENTURES WILL BE REMOVED PRIOR TO SURGERY PLEASE DO NOT APPLY "Poly grip" OR ADHESIVES!!!   Do NOT smoke after Midnight   Take these medicines the morning of surgery with A SIP OF WATER:  inhalers as usual and bring   DO NOT TAKE ANY ORAL DIABETIC MEDICATIONS DAY OF YOUR SURGERY  Bring CPAP mask and tubing day of surgery.                              You may not have any metal on your body including hair pins, jewelry, and body piercing             Do not wear make-up, lotions, powders, perfumes/cologne, or deodorant  Do not wear nail polish including gel and S&S, artificial/acrylic nails, or any other type of covering on natural nails including finger and toenails. If you have artificial nails, gel coating, etc. that needs to be removed by a nail salon please have this removed prior to surgery or surgery may need to be canceled/ delayed if the surgeon/ anesthesia feels like they are unable to be safely monitored.   Do not shave  48 hours prior to surgery.               Men may shave face and neck.   Do not bring valuables to the hospital. [facility] IS NOT             RESPONSIBLE   FOR VALUABLES.   Contacts, glasses, dentures or bridgework may not be worn into surgery.   Bring small overnight bag day of surgery.   DO NOT BRING YOUR HOME MEDICATIONS TO THE HOSPITAL. PHARMACY WILL DISPENSE MEDICATIONS LISTED ON YOUR MEDICATION LIST TO YOU DURING YOUR ADMISSION IN THE HOSPITAL!    Patients discharged on the day of surgery will not be allowed to drive home.  Someone NEEDS to stay with you for the first 24 hours after anesthesia.   Special Instructions: Bring a  copy of your healthcare power of attorney and living will documents the day of surgery if you haven't scanned them before.              Please read over the following fact sheets you were given: IF YOU HAVE QUESTIONS ABOUT YOUR PRE-OP INSTRUCTIONS PLEASE CALL [phone]   If you received a COVID test during your pre-op visit  it is requested that you wear a mask when out in public, stay away from anyone that may not be feeling well and notify your surgeon if you develop symptoms. If you test positive for Covid or have been in contact with anyone that has tested positive in the last 10 days please notify you surgeon.

## 2022-06-10 NOTE — Progress Notes (Signed)
Anesthesia Review:  PCP: INF Disease- 03/11/22- LOV with Felix Pacini  Cardiologist : Chest x-ray : EKG : Echo : Stress test: Cardiac Cath :  Activity level:  Sleep Study/ CPAP : Fasting Blood Sugar :      / Checks Blood Sugar -- times a day:   Blood Thinner/ Instructions /Last Dose: ASA / Instructions/ Last Dose :    81 mg aspirin

## 2022-06-11 ENCOUNTER — Other Ambulatory Visit: Payer: Self-pay

## 2022-06-11 ENCOUNTER — Encounter (HOSPITAL_COMMUNITY): Payer: Self-pay

## 2022-06-11 ENCOUNTER — Encounter (HOSPITAL_COMMUNITY)
Admission: RE | Admit: 2022-06-11 | Discharge: 2022-06-11 | Disposition: A | Discharge status: Home / Self Care | Payer: Medicare Other | Source: Ambulatory Visit | Attending: Orthopedic Surgery | Admitting: Orthopedic Surgery

## 2022-06-11 VITALS — BP 125/75 | HR 81 | Temp 98.1°F | Resp 16 | Ht 68.0 in

## 2022-06-11 DIAGNOSIS — Z01818 Encounter for other preprocedural examination: Secondary | ICD-10-CM | POA: Insufficient documentation

## 2022-06-11 LAB — COMPREHENSIVE METABOLIC PANEL
ALT: 34 U/L (ref 0–44)
AST: 54 U/L — ABNORMAL HIGH (ref 15–41)
Albumin: 2.9 g/dL — ABNORMAL LOW (ref 3.5–5.0)
Alkaline Phosphatase: 137 U/L — ABNORMAL HIGH (ref 38–126)
Anion gap: 6 (ref 5–15)
BUN: 30 mg/dL — ABNORMAL HIGH (ref 8–23)
CO2: 21 mmol/L — ABNORMAL LOW (ref 22–32)
Calcium: 9 mg/dL (ref 8.9–10.3)
Chloride: 107 mmol/L (ref 98–111)
Creatinine, Ser: 1.36 mg/dL — ABNORMAL HIGH (ref 0.61–1.24)
GFR, Estimated: 56 mL/min — ABNORMAL LOW (ref >60)
Glucose, Bld: 117 mg/dL — ABNORMAL HIGH (ref 70–99)
Potassium: 5.1 mmol/L (ref 3.5–5.1)
Sodium: 134 mmol/L — ABNORMAL LOW (ref 135–145)
Total Bilirubin: 0.4 mg/dL (ref 0.3–1.2)
Total Protein: 8.4 g/dL — ABNORMAL HIGH (ref 6.5–8.1)

## 2022-06-11 LAB — CBC
HCT: 27.9 % — ABNORMAL LOW (ref 39.0–52.0)
Hemoglobin: 8.5 g/dL — ABNORMAL LOW (ref 13.0–17.0)
MCH: 24.9 pg — ABNORMAL LOW (ref 26.0–34.0)
MCHC: 30.5 g/dL (ref 30.0–36.0)
MCV: 81.8 fL (ref 80.0–100.0)
Platelets: 174 10*3/uL (ref 150–400)
RBC: 3.41 MIL/uL — ABNORMAL LOW (ref 4.22–5.81)
RDW: 23.4 % — ABNORMAL HIGH (ref 11.5–15.5)
WBC: 3.8 10*3/uL — ABNORMAL LOW (ref 4.0–10.5)
nRBC: 0 % (ref 0.0–0.2)

## 2022-06-11 LAB — SURGICAL PCR SCREEN
MRSA, PCR: NEGATIVE
Staphylococcus aureus: NEGATIVE

## 2022-06-17 DIAGNOSIS — I10 Essential (primary) hypertension: Secondary | ICD-10-CM | POA: Diagnosis not present

## 2022-06-17 DIAGNOSIS — L02416 Cutaneous abscess of left lower limb: Secondary | ICD-10-CM | POA: Diagnosis not present

## 2022-06-17 DIAGNOSIS — D649 Anemia, unspecified: Secondary | ICD-10-CM | POA: Diagnosis not present

## 2022-06-19 DIAGNOSIS — D649 Anemia, unspecified: Secondary | ICD-10-CM | POA: Diagnosis not present

## 2022-06-23 ENCOUNTER — Ambulatory Visit (HOSPITAL_COMMUNITY): Admission: RE | Admit: 2022-06-23 | Payer: Medicare Other | Source: Home / Self Care | Admitting: Orthopedic Surgery

## 2022-06-23 ENCOUNTER — Encounter (HOSPITAL_COMMUNITY): Admission: RE | Payer: Self-pay | Source: Home / Self Care

## 2022-06-23 SURGERY — ARTHROPLASTY, KNEE, TOTAL
Anesthesia: Choice | Site: Knee | Laterality: Right

## 2022-06-23 NOTE — Progress Notes (Addendum)
Patient arrived to [facility] short Stay today for surgery.  Patient stated that he was not aware that his surgery had been cancelled.  Called to confirm that the patients surgery was cancelled.  Dr Despina Hick stated it was cancelled.  Patient needs to follow up with his PCP for his low Hgb.    Patient requested that Chandra Batch be notified of the cancellation the need to follow up with PCP.  Left voicemail for Clydie Braun to call back to discuss.  One number listed has been disconnected.    10:59 AM Chandra Batch called back.  I explained that the patient was cancelled due to his low Hgb and that he needed to follow up with his PCP.  Then he can be rescheduled for surgery and they will need to contact Dr Salina April office to get back on his schedule.

## 2022-06-26 DIAGNOSIS — D649 Anemia, unspecified: Secondary | ICD-10-CM | POA: Diagnosis not present

## 2022-07-30 DIAGNOSIS — L02416 Cutaneous abscess of left lower limb: Secondary | ICD-10-CM | POA: Diagnosis not present

## 2022-07-30 DIAGNOSIS — I10 Essential (primary) hypertension: Secondary | ICD-10-CM | POA: Diagnosis not present

## 2022-08-26 DIAGNOSIS — I10 Essential (primary) hypertension: Secondary | ICD-10-CM | POA: Diagnosis not present

## 2022-08-26 DIAGNOSIS — L02416 Cutaneous abscess of left lower limb: Secondary | ICD-10-CM | POA: Diagnosis not present

## 2022-08-27 DIAGNOSIS — L0291 Cutaneous abscess, unspecified: Secondary | ICD-10-CM | POA: Diagnosis not present

## 2022-09-09 DIAGNOSIS — L02416 Cutaneous abscess of left lower limb: Secondary | ICD-10-CM | POA: Diagnosis not present

## 2022-09-09 DIAGNOSIS — I10 Essential (primary) hypertension: Secondary | ICD-10-CM | POA: Diagnosis not present

## 2022-09-15 DIAGNOSIS — B9689 Other specified bacterial agents as the cause of diseases classified elsewhere: Secondary | ICD-10-CM | POA: Diagnosis not present

## 2022-09-15 DIAGNOSIS — Z79899 Other long term (current) drug therapy: Secondary | ICD-10-CM | POA: Diagnosis not present

## 2022-09-15 DIAGNOSIS — B20 Human immunodeficiency virus [HIV] disease: Secondary | ICD-10-CM | POA: Diagnosis not present

## 2022-09-15 DIAGNOSIS — Z006 Encounter for examination for normal comparison and control in clinical research program: Secondary | ICD-10-CM | POA: Diagnosis not present

## 2022-09-15 DIAGNOSIS — T8454XD Infection and inflammatory reaction due to internal left knee prosthesis, subsequent encounter: Secondary | ICD-10-CM | POA: Diagnosis not present

## 2022-09-15 DIAGNOSIS — R799 Abnormal finding of blood chemistry, unspecified: Secondary | ICD-10-CM | POA: Diagnosis not present

## 2022-09-15 DIAGNOSIS — B182 Chronic viral hepatitis C: Secondary | ICD-10-CM | POA: Diagnosis not present

## 2022-09-18 DIAGNOSIS — Z471 Aftercare following joint replacement surgery: Secondary | ICD-10-CM | POA: Diagnosis not present

## 2022-09-18 DIAGNOSIS — Z96652 Presence of left artificial knee joint: Secondary | ICD-10-CM | POA: Diagnosis not present

## 2022-09-30 NOTE — Progress Notes (Signed)
COVID Vaccine Completed:  Date of COVID positive in last 90 days:  PCP - Lia Hopping, MD Cardiologist -   Chest x-ray -  EKG - 06/11/22 Epic Stress Test -  ECHO -  Cardiac Cath -  Pacemaker/ICD device last checked: Spinal Cord Stimulator:  Bowel Prep -   Sleep Study -  CPAP -   Fasting Blood Sugar -  Checks Blood Sugar _____ times a day  Last dose of GLP1 agonist-  N/A GLP1 instructions:  N/A   Last dose of SGLT-2 inhibitors-  N/A SGLT-2 instructions: N/A   Blood Thinner Instructions:  Time Aspirin Instructions: ASA 81 Last Dose:  Activity level:  Can go up a flight of stairs and perform activities of daily living without stopping and without symptoms of chest pain or shortness of breath.  Able to exercise without symptoms  Unable to go up a flight of stairs without symptoms of     Anesthesia review: COPD, HTN, cirrhosis,   Patient denies shortness of breath, fever, cough and chest pain at PAT appointment  Patient verbalized understanding of instructions that were given to them at the PAT appointment. Patient was also instructed that they will need to review over the PAT instructions again at home before surgery.

## 2022-09-30 NOTE — Patient Instructions (Signed)
SURGICAL WAITING ROOM VISITATION  Patients having surgery or a procedure may have no more than 2 support people in the waiting area - these visitors may rotate.    Children under the age of 49 must have an adult with them who is not the patient.  Due to an increase in RSV and influenza rates and associated hospitalizations, children ages 57 and under may not visit patients in [facility] hospitals.  If the patient needs to stay at the hospital during part of their recovery, the visitor guidelines for inpatient rooms apply. Pre-op nurse will coordinate an appropriate time for 1 support person to accompany patient in pre-op.  This support person may not rotate.    Please refer to the [facility] website for the visitor guidelines for Inpatients (after your surgery is over and you are in a regular room).    Your procedure is scheduled on: 10/08/22   Report to [facility] Main Entrance    Report to admitting at 2:00 PM   Call this number if you have problems the morning of surgery [phone]   Do not eat food :After Midnight.   After Midnight you may have the following liquids until 1:15 PM DAY OF SURGERY  Water Non-Citrus Juices (without pulp, NO RED-Apple, White grape, White cranberry) Black Coffee (NO MILK/CREAM OR CREAMERS, sugar ok)  Clear Tea (NO MILK/CREAM OR CREAMERS, sugar ok) regular and decaf                             Plain Jell-O (NO RED)                                           Fruit ices (not with fruit pulp, NO RED)                                     Popsicles (NO RED)                                                               Sports drinks like Gatorade (NO RED)                 The day of surgery:  Drink ONE (1) Pre-Surgery Clear Ensure at 1:15 PM the morning of surgery. Drink in one sitting. Do not sip.  This drink was given to you during your hospital  pre-op appointment visit. Nothing else to drink after completing the  Pre-Surgery Clear Ensure.           If you have questions, please contact your surgeon's office.   FOLLOW BOWEL PREP AND ANY ADDITIONAL PRE OP INSTRUCTIONS YOU RECEIVED FROM YOUR SURGEON'S OFFICE!!!     Oral Hygiene is also important to reduce your risk of infection.                                    Remember - BRUSH YOUR TEETH THE MORNING OF SURGERY WITH YOUR REGULAR TOOTHPASTE  DENTURES WILL  BE REMOVED PRIOR TO SURGERY PLEASE DO NOT APPLY "Poly grip" OR ADHESIVES!!!   Do NOT smoke after Midnight   Stop all vitamins and herbal supplements 7 days before surgery.   Take these medicines the morning of surgery with A SIP OF WATER: Tylenol, Doxycycline, Descovy, Omeprazole, Zofran, Rategravir, Sulfamethoxadzole, Tamsulosin, Valacyclovir   DO NOT TAKE ANY ORAL DIABETIC MEDICATIONS DAY OF YOUR SURGERY  Bring CPAP mask and tubing day of surgery.                              You may not have any metal on your body including jewelry, and body piercing             Do not wear lotions, powders, cologne, or deodorant              Men may shave face and neck.   Do not bring valuables to the hospital. [facility] IS NOT             RESPONSIBLE   FOR VALUABLES.   Contacts, glasses, dentures or bridgework may not be worn into surgery.   Bring small overnight bag day of surgery.   DO NOT BRING YOUR HOME MEDICATIONS TO THE HOSPITAL. PHARMACY WILL DISPENSE MEDICATIONS LISTED ON YOUR MEDICATION LIST TO YOU DURING YOUR ADMISSION IN THE HOSPITAL!   Special Instructions: Bring a copy of your healthcare power of attorney and living will documents the day of surgery if you haven't scanned them before.              Please read over the following fact sheets you were given: IF YOU HAVE QUESTIONS ABOUT YOUR PRE-OP INSTRUCTIONS PLEASE CALL [name]   If you received a COVID test during your pre-op visit  it is requested that you wear a mask when out in public, stay away from anyone that may not be feeling well and  notify your surgeon if you develop symptoms. If you test positive for Covid or have been in contact with anyone that has tested positive in the last 10 days please notify you surgeon.      Pre-operative 5 CHG Bath Instructions   You can play a key role in reducing the risk of infection after surgery. Your skin needs to be as free of germs as possible. You can reduce the number of germs on your skin by washing with CHG (chlorhexidine gluconate) soap before surgery. CHG is an antiseptic soap that kills germs and continues to kill germs even after washing.   DO NOT use if you have an allergy to chlorhexidine/CHG or antibacterial soaps. If your skin becomes reddened or irritated, stop using the CHG and notify one of our RNs at [phone].   Please shower with the CHG soap starting 4 days before surgery using the following schedule:     Please keep in mind the following:  DO NOT shave, including legs and underarms, starting the day of your first shower.   You may shave your face at any point before/day of surgery.  Place clean sheets on your bed the day you start using CHG soap. Use a clean washcloth (not used since being washed) for each shower. DO NOT sleep with pets once you start using the CHG.   CHG Shower Instructions:  If you choose to wash your hair and private area, wash first with your normal shampoo/soap.  After you use shampoo/soap, rinse your hair and  body thoroughly to remove shampoo/soap residue.  Turn the water OFF and apply about 3 tablespoons (45 ml) of CHG soap to a CLEAN washcloth.  Apply CHG soap ONLY FROM YOUR NECK DOWN TO YOUR TOES (washing for 3-5 minutes)  DO NOT use CHG soap on face, private areas, open wounds, or sores.  Pay special attention to the area where your surgery is being performed.  If you are having back surgery, having someone wash your back for you may be helpful. Wait 2 minutes after CHG soap is applied, then you may rinse off the CHG soap.  Pat  dry with a clean towel  Put on clean clothes/pajamas   If you choose to wear lotion, please use ONLY the CHG-compatible lotions on the back of this paper.     Additional instructions for the day of surgery: DO NOT APPLY any lotions, deodorants, cologne, or perfumes.   Put on clean/comfortable clothes.  Brush your teeth.  Ask your nurse before applying any prescription medications to the skin.      CHG Compatible Lotions   Aveeno Moisturizing lotion  Cetaphil Moisturizing Cream  Cetaphil Moisturizing Lotion  Clairol Herbal Essence Moisturizing Lotion, Dry Skin  Clairol Herbal Essence Moisturizing Lotion, Extra Dry Skin  Clairol Herbal Essence Moisturizing Lotion, Normal Skin  Curel Age Defying Therapeutic Moisturizing Lotion with Alpha Hydroxy  Curel Extreme Care Body Lotion  Curel Soothing Hands Moisturizing Hand Lotion  Curel Therapeutic Moisturizing Cream, Fragrance-Free  Curel Therapeutic Moisturizing Lotion, Fragrance-Free  Curel Therapeutic Moisturizing Lotion, Original Formula  Eucerin Daily Replenishing Lotion  Eucerin Dry Skin Therapy Plus Alpha Hydroxy Crme  Eucerin Dry Skin Therapy Plus Alpha Hydroxy Lotion  Eucerin Original Crme  Eucerin Original Lotion  Eucerin Plus Crme Eucerin Plus Lotion  Eucerin TriLipid Replenishing Lotion  Keri Anti-Bacterial Hand Lotion  Keri Deep Conditioning Original Lotion Dry Skin Formula Softly Scented  Keri Deep Conditioning Original Lotion, Fragrance Free Sensitive Skin Formula  Keri Lotion Fast Absorbing Fragrance Free Sensitive Skin Formula  Keri Lotion Fast Absorbing Softly Scented Dry Skin Formula  Keri Original Lotion  Keri Skin Renewal Lotion Keri Silky Smooth Lotion  Keri Silky Smooth Sensitive Skin Lotion  Nivea Body Creamy Conditioning Oil  Nivea Body Extra Enriched Teacher, adult education Moisturizing Lotion Nivea Crme  Nivea Skin Firming Lotion  NutraDerm 30 Skin Lotion  NutraDerm  Skin Lotion  NutraDerm Therapeutic Skin Cream  NutraDerm Therapeutic Skin Lotion  ProShield Protective Hand Cream  Provon moisturizing lotion  WHAT IS A BLOOD TRANSFUSION? Blood Transfusion Information  A transfusion is the replacement of blood or some of its parts. Blood is made up of multiple cells which provide different functions. Red blood cells carry oxygen and are used for blood loss replacement. White blood cells fight against infection. Platelets control bleeding. Plasma helps clot blood. Other blood products are available for specialized needs, such as hemophilia or other clotting disorders. BEFORE THE TRANSFUSION  Who gives blood for transfusions?  Healthy volunteers who are fully evaluated to make sure their blood is safe. This is blood bank blood. Transfusion therapy is the safest it has ever been in the practice of medicine. Before blood is taken from a donor, a complete history is taken to make sure that person has no history of diseases nor engages in risky social behavior (examples are intravenous drug use or sexual activity with multiple partners). The donor's travel history is screened to minimize risk of transmitting infections, such  as malaria. The donated blood is tested for signs of infectious diseases, such as HIV and hepatitis. The blood is then tested to be sure it is compatible with you in order to minimize the chance of a transfusion reaction. If you or a relative donates blood, this is often done in anticipation of surgery and is not appropriate for emergency situations. It takes many days to process the donated blood. RISKS AND COMPLICATIONS Although transfusion therapy is very safe and saves many lives, the main dangers of transfusion include:  Getting an infectious disease. Developing a transfusion reaction. This is an allergic reaction to something in the blood you were given. Every precaution is taken to prevent this. The decision to have a blood transfusion has  been considered carefully by your caregiver before blood is given. Blood is not given unless the benefits outweigh the risks. AFTER THE TRANSFUSION Right after receiving a blood transfusion, you will usually feel much better and more energetic. This is especially true if your red blood cells have gotten low (anemic). The transfusion raises the level of the red blood cells which carry oxygen, and this usually causes an energy increase. The nurse administering the transfusion will monitor you carefully for complications. HOME CARE INSTRUCTIONS  No special instructions are needed after a transfusion. You may find your energy is better. Speak with your caregiver about any limitations on activity for underlying diseases you may have. SEEK MEDICAL CARE IF:  Your condition is not improving after your transfusion. You develop redness or irritation at the intravenous (IV) site. SEEK IMMEDIATE MEDICAL CARE IF:  Any of the following symptoms occur over the next 12 hours: Shaking chills. You have a temperature by mouth above 102 F (38.9 C), not controlled by medicine. Chest, back, or muscle pain. People around you feel you are not acting correctly or are confused. Shortness of breath or difficulty breathing. Dizziness and fainting. You get a rash or develop hives. You have a decrease in urine output. Your urine turns a dark color or changes to pink, red, or brown. Any of the following symptoms occur over the next 10 days: You have a temperature by mouth above 102 F (38.9 C), not controlled by medicine. Shortness of breath. Weakness after normal activity. The white part of the eye turns yellow (jaundice). You have a decrease in the amount of urine or are urinating less often. Your urine turns a dark color or changes to pink, red, or brown. Document Released: 01/18/2000 Document Revised: 04/14/2011 Document Reviewed: 09/06/2007 Exit[employer] Patient Information 2014 [place],  [place].  _______________________________________________________________________  Incentive Spirometer  An incentive spirometer is a tool that can help keep your lungs clear and active. This tool measures how well you are filling your lungs with each breath. Taking long deep breaths may help reverse or decrease the chance of developing breathing (pulmonary) problems (especially infection) following: A long period of time when you are unable to move or be active. BEFORE THE PROCEDURE  If the spirometer includes an indicator to show your best effort, your nurse or respiratory therapist will set it to a desired goal. If possible, sit up straight or lean slightly forward. Try not to slouch. Hold the incentive spirometer in an upright position. INSTRUCTIONS FOR USE  Sit on the edge of your bed if possible, or sit up as far as you can in bed or on a chair. Hold the incentive spirometer in an upright position. Breathe out normally. Place the mouthpiece in your mouth and seal  your lips tightly around it. Breathe in slowly and as deeply as possible, raising the piston or the ball toward the top of the column. Hold your breath for 3-5 seconds or for as long as possible. Allow the piston or ball to fall to the bottom of the column. Remove the mouthpiece from your mouth and breathe out normally. Rest for a few seconds and repeat Steps 1 through 7 at least 10 times every 1-2 hours when you are awake. Take your time and take a few normal breaths between deep breaths. The spirometer may include an indicator to show your best effort. Use the indicator as a goal to work toward during each repetition. After each set of 10 deep breaths, practice coughing to be sure your lungs are clear. If you have an incision (the cut made at the time of surgery), support your incision when coughing by placing a pillow or rolled up towels firmly against it. Once you are able to get out of bed, walk around indoors and cough well.  You may stop using the incentive spirometer when instructed by your caregiver.  RISKS AND COMPLICATIONS Take your time so you do not get dizzy or light-headed. If you are in pain, you may need to take or ask for pain medication before doing incentive spirometry. It is harder to take a deep breath if you are having pain. AFTER USE Rest and breathe slowly and easily. It can be helpful to keep track of a log of your progress. Your caregiver can provide you with a simple table to help with this. If you are using the spirometer at home, follow these instructions: SEEK MEDICAL CARE IF:  You are having difficultly using the spirometer. You have trouble using the spirometer as often as instructed. Your pain medication is not giving enough relief while using the spirometer. You develop fever of 100.5 F (38.1 C) or higher. SEEK IMMEDIATE MEDICAL CARE IF:  You cough up bloody sputum that had not been present before. You develop fever of 102 F (38.9 C) or greater. You develop worsening pain at or near the incision site. MAKE SURE YOU:  Understand these instructions. Will watch your condition. Will get help right away if you are not doing well or get worse. Document Released: 06/02/2006 Document Revised: 04/14/2011 Document Reviewed: 08/03/2006 [facility] Patient Information 2014 [place], [place].   ________________________________________________________________________

## 2022-10-01 ENCOUNTER — Encounter (HOSPITAL_COMMUNITY): Payer: Self-pay

## 2022-10-01 ENCOUNTER — Other Ambulatory Visit: Payer: Self-pay

## 2022-10-01 ENCOUNTER — Encounter (HOSPITAL_COMMUNITY)
Admission: RE | Admit: 2022-10-01 | Discharge: 2022-10-01 | Disposition: A | Discharge status: Home / Self Care | Payer: Medicare Other | Source: Ambulatory Visit | Attending: Orthopedic Surgery | Admitting: Orthopedic Surgery

## 2022-10-01 VITALS — BP 136/76 | HR 99 | Temp 98.4°F | Resp 18 | Ht 68.0 in

## 2022-10-01 DIAGNOSIS — Z01818 Encounter for other preprocedural examination: Secondary | ICD-10-CM | POA: Diagnosis present

## 2022-10-01 DIAGNOSIS — T8454XA Infection and inflammatory reaction due to internal left knee prosthesis, initial encounter: Secondary | ICD-10-CM | POA: Diagnosis not present

## 2022-10-01 DIAGNOSIS — Z01812 Encounter for preprocedural laboratory examination: Secondary | ICD-10-CM | POA: Insufficient documentation

## 2022-10-01 LAB — SURGICAL PCR SCREEN
MRSA, PCR: NEGATIVE
Staphylococcus aureus: NEGATIVE
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
## 2022-10-07 NOTE — Progress Notes (Signed)
Spoke to Chandra Batch, Pt's care coordinator, regarding time change of arrival to [facility] tomorrow at 1245.  Pt has prearranged transportation with [employer].  She will attempt to call them in am to update.  She will call short stay if she is unable to accommodate this time change.

## 2022-10-07 NOTE — Progress Notes (Addendum)
Message sent to IV team about PICC line consent before surgery tomrorow   10/08/22 11:46 AM spoke with IV team nurse.  Someone will come down when the patient arrives to have the consent signed before surgery and the PICC will be placed post procedure  10/08/2022 12:27 PM IV team notified of patients arrival to short stay   PICC consent has been signed by patient and planned PICC insertion once patient is in PACU

## 2022-10-08 ENCOUNTER — Encounter (HOSPITAL_COMMUNITY): Payer: Self-pay | Admitting: Orthopedic Surgery

## 2022-10-08 ENCOUNTER — Other Ambulatory Visit: Payer: Self-pay

## 2022-10-08 ENCOUNTER — Inpatient Hospital Stay (HOSPITAL_COMMUNITY): Payer: Medicare Other | Admitting: Physician Assistant

## 2022-10-08 ENCOUNTER — Inpatient Hospital Stay (HOSPITAL_COMMUNITY): Payer: Medicare Other | Admitting: Anesthesiology

## 2022-10-08 ENCOUNTER — Encounter (HOSPITAL_COMMUNITY)
Admission: RE | Disposition: A | Discharge status: Skilled Nursing Facility | Payer: Self-pay | Source: Home / Self Care | Attending: Orthopedic Surgery

## 2022-10-08 ENCOUNTER — Inpatient Hospital Stay (HOSPITAL_COMMUNITY)
Admission: RE | Admit: 2022-10-08 | Discharge: 2022-10-11 | DRG: 464 | Disposition: A | Discharge status: Skilled Nursing Facility | Payer: Medicare Other | Attending: Orthopedic Surgery | Admitting: Orthopedic Surgery

## 2022-10-08 DIAGNOSIS — E785 Hyperlipidemia, unspecified: Secondary | ICD-10-CM | POA: Diagnosis present

## 2022-10-08 DIAGNOSIS — M1990 Unspecified osteoarthritis, unspecified site: Secondary | ICD-10-CM | POA: Diagnosis present

## 2022-10-08 DIAGNOSIS — I10 Essential (primary) hypertension: Secondary | ICD-10-CM | POA: Diagnosis not present

## 2022-10-08 DIAGNOSIS — Z7901 Long term (current) use of anticoagulants: Secondary | ICD-10-CM | POA: Diagnosis not present

## 2022-10-08 DIAGNOSIS — K922 Gastrointestinal hemorrhage, unspecified: Secondary | ICD-10-CM | POA: Diagnosis not present

## 2022-10-08 DIAGNOSIS — T8450XA Infection and inflammatory reaction due to unspecified internal joint prosthesis, initial encounter: Secondary | ICD-10-CM | POA: Diagnosis present

## 2022-10-08 DIAGNOSIS — K920 Hematemesis: Secondary | ICD-10-CM | POA: Diagnosis not present

## 2022-10-08 DIAGNOSIS — K219 Gastro-esophageal reflux disease without esophagitis: Secondary | ICD-10-CM | POA: Diagnosis present

## 2022-10-08 DIAGNOSIS — Y831 Surgical operation with implant of artificial internal device as the cause of abnormal reaction of the patient, or of later complication, without mention of misadventure at the time of the procedure: Secondary | ICD-10-CM | POA: Diagnosis present

## 2022-10-08 DIAGNOSIS — D649 Anemia, unspecified: Secondary | ICD-10-CM | POA: Diagnosis present

## 2022-10-08 DIAGNOSIS — T8454XA Infection and inflammatory reaction due to internal left knee prosthesis, initial encounter: Secondary | ICD-10-CM | POA: Diagnosis not present

## 2022-10-08 DIAGNOSIS — B20 Human immunodeficiency virus [HIV] disease: Secondary | ICD-10-CM | POA: Diagnosis present

## 2022-10-08 DIAGNOSIS — Z87891 Personal history of nicotine dependence: Secondary | ICD-10-CM

## 2022-10-08 DIAGNOSIS — M1732 Unilateral post-traumatic osteoarthritis, left knee: Secondary | ICD-10-CM | POA: Diagnosis present

## 2022-10-08 DIAGNOSIS — J4489 Other specified chronic obstructive pulmonary disease: Secondary | ICD-10-CM | POA: Diagnosis present

## 2022-10-08 DIAGNOSIS — Z7982 Long term (current) use of aspirin: Secondary | ICD-10-CM

## 2022-10-08 DIAGNOSIS — Z6829 Body mass index (BMI) 29.0-29.9, adult: Secondary | ICD-10-CM

## 2022-10-08 DIAGNOSIS — K5900 Constipation, unspecified: Secondary | ICD-10-CM | POA: Diagnosis present

## 2022-10-08 DIAGNOSIS — R112 Nausea with vomiting, unspecified: Secondary | ICD-10-CM | POA: Diagnosis present

## 2022-10-08 DIAGNOSIS — F0390 Unspecified dementia without behavioral disturbance: Secondary | ICD-10-CM | POA: Diagnosis present

## 2022-10-08 DIAGNOSIS — T8454XD Infection and inflammatory reaction due to internal left knee prosthesis, subsequent encounter: Secondary | ICD-10-CM | POA: Diagnosis not present

## 2022-10-08 DIAGNOSIS — Z7401 Bed confinement status: Secondary | ICD-10-CM | POA: Diagnosis not present

## 2022-10-08 DIAGNOSIS — K209 Esophagitis, unspecified without bleeding: Secondary | ICD-10-CM | POA: Diagnosis not present

## 2022-10-08 DIAGNOSIS — G8918 Other acute postprocedural pain: Secondary | ICD-10-CM | POA: Diagnosis not present

## 2022-10-08 DIAGNOSIS — F17210 Nicotine dependence, cigarettes, uncomplicated: Secondary | ICD-10-CM | POA: Diagnosis present

## 2022-10-08 DIAGNOSIS — Z79899 Other long term (current) drug therapy: Secondary | ICD-10-CM

## 2022-10-08 DIAGNOSIS — D8481 Immunodeficiency due to conditions classified elsewhere: Secondary | ICD-10-CM | POA: Diagnosis present

## 2022-10-08 DIAGNOSIS — Z8619 Personal history of other infectious and parasitic diseases: Secondary | ICD-10-CM

## 2022-10-08 DIAGNOSIS — Z96659 Presence of unspecified artificial knee joint: Secondary | ICD-10-CM | POA: Diagnosis present

## 2022-10-08 DIAGNOSIS — J449 Chronic obstructive pulmonary disease, unspecified: Secondary | ICD-10-CM

## 2022-10-08 DIAGNOSIS — K2081 Other esophagitis with bleeding: Secondary | ICD-10-CM | POA: Diagnosis not present

## 2022-10-08 DIAGNOSIS — Z23 Encounter for immunization: Secondary | ICD-10-CM | POA: Diagnosis not present

## 2022-10-08 DIAGNOSIS — N401 Enlarged prostate with lower urinary tract symptoms: Secondary | ICD-10-CM | POA: Diagnosis present

## 2022-10-08 DIAGNOSIS — G4700 Insomnia, unspecified: Secondary | ICD-10-CM | POA: Diagnosis present

## 2022-10-08 DIAGNOSIS — E441 Mild protein-calorie malnutrition: Secondary | ICD-10-CM | POA: Diagnosis present

## 2022-10-08 DIAGNOSIS — K56609 Unspecified intestinal obstruction, unspecified as to partial versus complete obstruction: Secondary | ICD-10-CM | POA: Diagnosis not present

## 2022-10-08 DIAGNOSIS — R41841 Cognitive communication deficit: Secondary | ICD-10-CM | POA: Diagnosis present

## 2022-10-08 DIAGNOSIS — I129 Hypertensive chronic kidney disease with stage 1 through stage 4 chronic kidney disease, or unspecified chronic kidney disease: Secondary | ICD-10-CM | POA: Diagnosis not present

## 2022-10-08 DIAGNOSIS — A6002 Herpesviral infection of other male genital organs: Secondary | ICD-10-CM | POA: Diagnosis present

## 2022-10-08 DIAGNOSIS — I8510 Secondary esophageal varices without bleeding: Secondary | ICD-10-CM | POA: Diagnosis not present

## 2022-10-08 DIAGNOSIS — A539 Syphilis, unspecified: Secondary | ICD-10-CM | POA: Diagnosis present

## 2022-10-08 DIAGNOSIS — R29898 Other symptoms and signs involving the musculoskeletal system: Secondary | ICD-10-CM | POA: Diagnosis not present

## 2022-10-08 DIAGNOSIS — M009 Pyogenic arthritis, unspecified: Secondary | ICD-10-CM | POA: Diagnosis not present

## 2022-10-08 DIAGNOSIS — M6281 Muscle weakness (generalized): Secondary | ICD-10-CM | POA: Diagnosis present

## 2022-10-08 DIAGNOSIS — N400 Enlarged prostate without lower urinary tract symptoms: Secondary | ICD-10-CM | POA: Diagnosis present

## 2022-10-08 DIAGNOSIS — E669 Obesity, unspecified: Secondary | ICD-10-CM | POA: Diagnosis present

## 2022-10-08 DIAGNOSIS — Z96652 Presence of left artificial knee joint: Secondary | ICD-10-CM | POA: Diagnosis present

## 2022-10-08 DIAGNOSIS — K7460 Unspecified cirrhosis of liver: Secondary | ICD-10-CM | POA: Diagnosis not present

## 2022-10-08 DIAGNOSIS — B182 Chronic viral hepatitis C: Secondary | ICD-10-CM | POA: Diagnosis present

## 2022-10-08 HISTORY — PX: EXCISIONAL TOTAL KNEE ARTHROPLASTY WITH ANTIBIOTIC SPACERS: SHX5827

## 2022-10-08 LAB — TYPE AND SCREEN
ABO/RH(D): O POS
Antibody Screen: NEGATIVE

## 2022-10-08 SURGERY — REMOVAL, TOTAL ARTHROPLASTY HARDWARE, KNEE, WITH ANTIBIOTIC SPACER INSERTION
Anesthesia: Spinal | Site: Knee | Laterality: Left

## 2022-10-08 MED ORDER — METHOCARBAMOL 500 MG IVPB - SIMPLE MED: 500.0000 mg | Freq: Four times a day (QID) | INTRAVENOUS | Status: DC | PRN

## 2022-10-08 MED ORDER — ACETAMINOPHEN 10 MG/ML IV SOLN
1000.0000 mg | Freq: Once | INTRAVENOUS | Status: AC
  Administered 2022-10-08: 1000 mg via INTRAVENOUS
  Filled 2022-10-08: qty 100

## 2022-10-08 MED ORDER — PHENOL 1.4 % MT LIQD: 1.0000 | OROMUCOSAL | Status: DC | PRN

## 2022-10-08 MED ORDER — ONDANSETRON HCL 4 MG PO TABS: 4.0000 mg | ORAL_TABLET | Freq: Four times a day (QID) | ORAL | Status: DC | PRN

## 2022-10-08 MED ORDER — PHENYLEPHRINE HCL-NACL 20-0.9 MG/250ML-% IV SOLN
INTRAVENOUS | Status: DC | PRN
  Administered 2022-10-08: 50 ug/min via INTRAVENOUS

## 2022-10-08 MED ORDER — ALBUTEROL SULFATE (2.5 MG/3ML) 0.083% IN NEBU: 2.5000 mg | INHALATION_SOLUTION | Freq: Four times a day (QID) | RESPIRATORY_TRACT | Status: DC | PRN

## 2022-10-08 MED ORDER — MIDAZOLAM HCL 2 MG/2ML IJ SOLN
1.0000 mg | INTRAMUSCULAR | Status: DC
  Administered 2022-10-08: 2 mg via INTRAVENOUS
  Filled 2022-10-08: qty 2

## 2022-10-08 MED ORDER — ONDANSETRON HCL 4 MG/2ML IJ SOLN
INTRAMUSCULAR | Status: AC
  Filled 2022-10-08: qty 2

## 2022-10-08 MED ORDER — PROMETHAZINE HCL 25 MG/ML IJ SOLN: 6.2500 mg | INTRAMUSCULAR | Status: DC | PRN

## 2022-10-08 MED ORDER — ACETAMINOPHEN 500 MG PO TABS
1000.0000 mg | ORAL_TABLET | Freq: Four times a day (QID) | ORAL | Status: AC
  Administered 2022-10-08 – 2022-10-09 (×4): 1000 mg via ORAL
  Filled 2022-10-08 (×4): qty 2

## 2022-10-08 MED ORDER — TRAMADOL HCL 50 MG PO TABS
50.0000 mg | ORAL_TABLET | Freq: Four times a day (QID) | ORAL | Status: DC | PRN
  Administered 2022-10-09: 100 mg via ORAL
  Filled 2022-10-08 (×2): qty 2

## 2022-10-08 MED ORDER — TAMSULOSIN HCL 0.4 MG PO CAPS
0.8000 mg | ORAL_CAPSULE | Freq: Every day | ORAL | Status: DC
  Administered 2022-10-09 – 2022-10-11 (×3): 0.8 mg via ORAL
  Filled 2022-10-08 (×3): qty 2

## 2022-10-08 MED ORDER — OXYCODONE HCL 5 MG PO TABS
5.0000 mg | ORAL_TABLET | ORAL | Status: DC | PRN
  Administered 2022-10-08 – 2022-10-10 (×8): 10 mg via ORAL
  Filled 2022-10-08 (×9): qty 2

## 2022-10-08 MED ORDER — METOCLOPRAMIDE HCL 5 MG/ML IJ SOLN: 5.0000 mg | Freq: Three times a day (TID) | INTRAMUSCULAR | Status: DC | PRN

## 2022-10-08 MED ORDER — BUPIVACAINE LIPOSOME 1.3 % IJ SUSP: 20.0000 mL | Freq: Once | INTRAMUSCULAR | Status: DC

## 2022-10-08 MED ORDER — RALTEGRAVIR POTASSIUM 400 MG PO TABS
400.0000 mg | ORAL_TABLET | Freq: Two times a day (BID) | ORAL | Status: DC
  Administered 2022-10-08 – 2022-10-11 (×6): 400 mg via ORAL
  Filled 2022-10-08 (×6): qty 1

## 2022-10-08 MED ORDER — VALACYCLOVIR HCL 500 MG PO TABS
500.0000 mg | ORAL_TABLET | Freq: Every day | ORAL | Status: DC
  Administered 2022-10-09 – 2022-10-11 (×3): 500 mg via ORAL
  Filled 2022-10-08 (×3): qty 1

## 2022-10-08 MED ORDER — PHENYLEPHRINE 80 MCG/ML (10ML) SYRINGE FOR IV PUSH (FOR BLOOD PRESSURE SUPPORT)
PREFILLED_SYRINGE | INTRAVENOUS | Status: AC
  Filled 2022-10-08: qty 10

## 2022-10-08 MED ORDER — ONDANSETRON HCL 4 MG/2ML IJ SOLN
INTRAMUSCULAR | Status: DC | PRN
  Administered 2022-10-08: 4 mg via INTRAVENOUS

## 2022-10-08 MED ORDER — ROPIVACAINE HCL 5 MG/ML IJ SOLN
INTRAMUSCULAR | Status: DC | PRN
Start: 2022-10-08 — End: 2022-10-08
  Administered 2022-10-08: 20 mL via PERINEURAL

## 2022-10-08 MED ORDER — PROPOFOL 1000 MG/100ML IV EMUL
INTRAVENOUS | Status: AC
  Filled 2022-10-08: qty 100

## 2022-10-08 MED ORDER — DEXAMETHASONE SODIUM PHOSPHATE 10 MG/ML IJ SOLN
8.0000 mg | Freq: Once | INTRAMUSCULAR | Status: AC
  Administered 2022-10-08: 8 mg via INTRAVENOUS

## 2022-10-08 MED ORDER — OXYCODONE HCL 5 MG/5ML PO SOLN: 5.0000 mg | Freq: Once | ORAL | Status: DC | PRN

## 2022-10-08 MED ORDER — LACTATED RINGERS IV SOLN: INTRAVENOUS | Status: DC

## 2022-10-08 MED ORDER — LOSARTAN POTASSIUM 50 MG PO TABS
100.0000 mg | ORAL_TABLET | Freq: Every day | ORAL | Status: DC
  Administered 2022-10-09: 100 mg via ORAL
  Filled 2022-10-08: qty 2

## 2022-10-08 MED ORDER — INFLUENZA VAC A&B SURF ANT ADJ 0.5 ML IM SUSY
0.5000 mL | PREFILLED_SYRINGE | INTRAMUSCULAR | Status: AC | PRN
  Administered 2022-10-11: 0.5 mL via INTRAMUSCULAR
  Filled 2022-10-08: qty 0.5

## 2022-10-08 MED ORDER — ACETAMINOPHEN 325 MG PO TABS: 325.0000 mg | ORAL_TABLET | Freq: Four times a day (QID) | ORAL | Status: DC | PRN

## 2022-10-08 MED ORDER — ORAL CARE MOUTH RINSE: 15.0000 mL | Freq: Once | OROMUCOSAL | Status: AC

## 2022-10-08 MED ORDER — MENTHOL 3 MG MT LOZG: 1.0000 | LOZENGE | OROMUCOSAL | Status: DC | PRN

## 2022-10-08 MED ORDER — CEFAZOLIN SODIUM-DEXTROSE 2-4 GM/100ML-% IV SOLN
2.0000 g | INTRAVENOUS | Status: AC
  Administered 2022-10-08: 2 g via INTRAVENOUS
  Filled 2022-10-08: qty 100

## 2022-10-08 MED ORDER — DEXAMETHASONE SODIUM PHOSPHATE 10 MG/ML IJ SOLN
INTRAMUSCULAR | Status: AC
  Filled 2022-10-08: qty 1

## 2022-10-08 MED ORDER — ONDANSETRON HCL 4 MG/2ML IJ SOLN
4.0000 mg | Freq: Four times a day (QID) | INTRAMUSCULAR | Status: DC | PRN
  Administered 2022-10-10: 4 mg via INTRAVENOUS
  Filled 2022-10-08: qty 2

## 2022-10-08 MED ORDER — SODIUM CHLORIDE 0.9 % IV SOLN: INTRAVENOUS | Status: DC

## 2022-10-08 MED ORDER — FLEET ENEMA RE ENEM: 1.0000 | ENEMA | Freq: Once | RECTAL | Status: DC | PRN

## 2022-10-08 MED ORDER — PROPOFOL 500 MG/50ML IV EMUL
INTRAVENOUS | Status: DC | PRN
  Administered 2022-10-08: 100 ug/kg/min via INTRAVENOUS

## 2022-10-08 MED ORDER — CEFAZOLIN SODIUM-DEXTROSE 2-4 GM/100ML-% IV SOLN: 2.0000 g | INTRAVENOUS | Status: DC

## 2022-10-08 MED ORDER — POVIDONE-IODINE 10 % EX SWAB
2.0000 | Freq: Once | CUTANEOUS | Status: AC
  Administered 2022-10-08: 2 via TOPICAL

## 2022-10-08 MED ORDER — FENTANYL CITRATE PF 50 MCG/ML IJ SOSY
50.0000 ug | PREFILLED_SYRINGE | INTRAMUSCULAR | Status: DC
  Administered 2022-10-08: 50 ug via INTRAVENOUS
  Filled 2022-10-08: qty 2

## 2022-10-08 MED ORDER — METOCLOPRAMIDE HCL 5 MG PO TABS: 5.0000 mg | ORAL_TABLET | Freq: Three times a day (TID) | ORAL | Status: DC | PRN

## 2022-10-08 MED ORDER — BUPIVACAINE IN DEXTROSE 0.75-8.25 % IT SOLN
INTRATHECAL | Status: DC | PRN
  Administered 2022-10-08: 1.8 mL via INTRATHECAL

## 2022-10-08 MED ORDER — SULFAMETHOXAZOLE-TRIMETHOPRIM 400-80 MG PO TABS
1.0000 | ORAL_TABLET | Freq: Every day | ORAL | Status: DC
  Administered 2022-10-09 – 2022-10-11 (×3): 1 via ORAL
  Filled 2022-10-08 (×3): qty 1

## 2022-10-08 MED ORDER — MORPHINE SULFATE (PF) 2 MG/ML IV SOLN
1.0000 mg | INTRAVENOUS | Status: DC | PRN
  Administered 2022-10-08: 2 mg via INTRAVENOUS
  Filled 2022-10-08: qty 1

## 2022-10-08 MED ORDER — POLYETHYLENE GLYCOL 3350 17 G PO PACK: 17.0000 g | PACK | Freq: Every day | ORAL | Status: DC | PRN

## 2022-10-08 MED ORDER — BUDESONIDE 0.25 MG/2ML IN SUSP
0.2500 mg | Freq: Two times a day (BID) | RESPIRATORY_TRACT | Status: DC
  Administered 2022-10-08 – 2022-10-11 (×6): 0.25 mg via RESPIRATORY_TRACT
  Filled 2022-10-08 (×7): qty 2

## 2022-10-08 MED ORDER — TRANEXAMIC ACID-NACL 1000-0.7 MG/100ML-% IV SOLN
1000.0000 mg | INTRAVENOUS | Status: AC
  Administered 2022-10-08: 1000 mg via INTRAVENOUS
  Filled 2022-10-08: qty 100

## 2022-10-08 MED ORDER — HYDROMORPHONE HCL 1 MG/ML IJ SOLN: 0.2500 mg | INTRAMUSCULAR | Status: DC | PRN

## 2022-10-08 MED ORDER — PROPOFOL 10 MG/ML IV BOLUS
INTRAVENOUS | Status: DC | PRN
  Administered 2022-10-08: 10 mg via INTRAVENOUS

## 2022-10-08 MED ORDER — PANTOPRAZOLE SODIUM 40 MG PO TBEC
40.0000 mg | DELAYED_RELEASE_TABLET | Freq: Every day | ORAL | Status: DC
  Administered 2022-10-09 – 2022-10-11 (×3): 40 mg via ORAL
  Filled 2022-10-08 (×3): qty 1

## 2022-10-08 MED ORDER — OXYCODONE HCL 5 MG PO TABS: 5.0000 mg | ORAL_TABLET | Freq: Once | ORAL | Status: DC | PRN

## 2022-10-08 MED ORDER — 0.9 % SODIUM CHLORIDE (POUR BTL) OPTIME
TOPICAL | Status: DC | PRN
Start: 2022-10-08 — End: 2022-10-08
  Administered 2022-10-08: 1000 mL

## 2022-10-08 MED ORDER — METHOCARBAMOL 500 MG PO TABS
500.0000 mg | ORAL_TABLET | Freq: Four times a day (QID) | ORAL | Status: DC | PRN
  Administered 2022-10-09 – 2022-10-10 (×4): 500 mg via ORAL
  Filled 2022-10-08 (×5): qty 1

## 2022-10-08 MED ORDER — CHLORHEXIDINE GLUCONATE 0.12 % MT SOLN
15.0000 mL | Freq: Once | OROMUCOSAL | Status: AC
  Administered 2022-10-08: 15 mL via OROMUCOSAL

## 2022-10-08 MED ORDER — EMTRICITABINE-TENOFOVIR AF 200-25 MG PO TABS
1.0000 | ORAL_TABLET | Freq: Every day | ORAL | Status: DC
  Administered 2022-10-09 – 2022-10-11 (×3): 1 via ORAL
  Filled 2022-10-08 (×3): qty 1

## 2022-10-08 MED ORDER — CEFAZOLIN SODIUM-DEXTROSE 2-4 GM/100ML-% IV SOLN
2.0000 g | Freq: Four times a day (QID) | INTRAVENOUS | Status: AC
  Administered 2022-10-08 – 2022-10-09 (×2): 2 g via INTRAVENOUS
  Filled 2022-10-08 (×2): qty 100

## 2022-10-08 MED ORDER — APIXABAN 2.5 MG PO TABS
2.5000 mg | ORAL_TABLET | Freq: Two times a day (BID) | ORAL | Status: DC
  Administered 2022-10-09 – 2022-10-11 (×5): 2.5 mg via ORAL
  Filled 2022-10-08 (×5): qty 1

## 2022-10-08 MED ORDER — PROPOFOL 500 MG/50ML IV EMUL
INTRAVENOUS | Status: AC
  Filled 2022-10-08: qty 50

## 2022-10-08 MED ORDER — DOXYCYCLINE HYCLATE 100 MG PO TABS
100.0000 mg | ORAL_TABLET | Freq: Two times a day (BID) | ORAL | Status: DC
  Administered 2022-10-08 – 2022-10-09 (×2): 100 mg via ORAL
  Filled 2022-10-08 (×2): qty 1

## 2022-10-08 MED ORDER — PHENYLEPHRINE 80 MCG/ML (10ML) SYRINGE FOR IV PUSH (FOR BLOOD PRESSURE SUPPORT)
PREFILLED_SYRINGE | INTRAVENOUS | Status: DC | PRN
Start: 2022-10-08 — End: 2022-10-08
  Administered 2022-10-08: 240 ug via INTRAVENOUS
  Administered 2022-10-08: 160 ug via INTRAVENOUS
  Administered 2022-10-08: 240 ug via INTRAVENOUS

## 2022-10-08 MED ORDER — DOCUSATE SODIUM 100 MG PO CAPS
100.0000 mg | ORAL_CAPSULE | Freq: Two times a day (BID) | ORAL | Status: DC
  Administered 2022-10-08 – 2022-10-11 (×6): 100 mg via ORAL
  Filled 2022-10-08 (×6): qty 1

## 2022-10-08 MED ORDER — SODIUM CHLORIDE 0.9 % IR SOLN
Status: DC | PRN
  Administered 2022-10-08: 3000 mL

## 2022-10-08 MED ORDER — STERILE WATER FOR IRRIGATION IR SOLN
Status: DC | PRN
Start: 2022-10-08 — End: 2022-10-08
  Administered 2022-10-08: 2000 mL

## 2022-10-08 MED ORDER — DEXAMETHASONE SODIUM PHOSPHATE 10 MG/ML IJ SOLN
10.0000 mg | Freq: Once | INTRAMUSCULAR | Status: AC
  Administered 2022-10-09: 10 mg via INTRAVENOUS
  Filled 2022-10-08: qty 1

## 2022-10-08 MED ORDER — BISACODYL 10 MG RE SUPP: 10.0000 mg | Freq: Every day | RECTAL | Status: DC | PRN

## 2022-10-08 MED ORDER — DIPHENHYDRAMINE HCL 12.5 MG/5ML PO ELIX: 12.5000 mg | ORAL_SOLUTION | ORAL | Status: DC | PRN

## 2022-10-08 SURGICAL SUPPLY — 54 items
BAG COUNTER SPONGE SURGICOUNT (BAG) IMPLANT
BAG SPEC THK2 15X12 ZIP CLS (MISCELLANEOUS) ×1
BAG SPNG CNTER NS LX DISP (BAG)
BAG ZIPLOCK 12X15 (MISCELLANEOUS) ×1 IMPLANT
BLADE SAG 18X100X1.27 (BLADE) ×1 IMPLANT
BLADE SAW SGTL 11.0X1.19X90.0M (BLADE) ×1 IMPLANT
BNDG CMPR 6 X 5 YARDS HK CLSR (GAUZE/BANDAGES/DRESSINGS) ×1
BNDG ELASTIC 6INX 5YD STR LF (GAUZE/BANDAGES/DRESSINGS) ×1 IMPLANT
BONE CEMENT GENTAMICIN (Cement) ×4 IMPLANT
BOWL SMART MIX CTS (DISPOSABLE) IMPLANT
CEMENT BONE GENTAMICIN 40 (Cement) ×3 IMPLANT
COVER SURGICAL LIGHT HANDLE (MISCELLANEOUS) ×1 IMPLANT
CUFF TOURN SGL QUICK 34 (TOURNIQUET CUFF) ×1
CUFF TRNQT CYL 34X4.125X (TOURNIQUET CUFF) ×1 IMPLANT
DRAPE EXTREMITY T 121X128X90 (DISPOSABLE) ×1 IMPLANT
DRAPE INCISE IOBAN 66X45 STRL (DRAPES) ×1 IMPLANT
DRAPE U-SHAPE 47X51 STRL (DRAPES) ×1 IMPLANT
DRSG ADAPTIC 3X8 NADH LF (GAUZE/BANDAGES/DRESSINGS) ×1 IMPLANT
DURAPREP 26ML APPLICATOR (WOUND CARE) ×1 IMPLANT
ELECT REM PT RETURN 15FT ADLT (MISCELLANEOUS) ×1 IMPLANT
EVACUATOR 1/8 PVC DRAIN (DRAIN) ×1 IMPLANT
GAUZE PAD ABD 8X10 STRL (GAUZE/BANDAGES/DRESSINGS) ×1 IMPLANT
GAUZE SPONGE 4X4 12PLY STRL (GAUZE/BANDAGES/DRESSINGS) ×1 IMPLANT
GLOVE BIO SURGEON STRL SZ 6.5 (GLOVE) ×1 IMPLANT
GLOVE BIO SURGEON STRL SZ7.5 (GLOVE) ×1 IMPLANT
GLOVE BIO SURGEON STRL SZ8 (GLOVE) ×2 IMPLANT
GLOVE BIOGEL PI IND STRL 6.5 (GLOVE) IMPLANT
GLOVE BIOGEL PI IND STRL 7.0 (GLOVE) ×2 IMPLANT
GLOVE BIOGEL PI IND STRL 8 (GLOVE) ×1 IMPLANT
GOWN STRL REUS W/ TWL LRG LVL3 (GOWN DISPOSABLE) ×1 IMPLANT
GOWN STRL REUS W/TWL LRG LVL3 (GOWN DISPOSABLE) ×1
HANDPIECE INTERPULSE COAX TIP (DISPOSABLE) ×1
HOOD PEEL AWAY T7 (MISCELLANEOUS) IMPLANT
IMMOBILIZER KNEE 20 (SOFTGOODS) ×1
IMMOBILIZER KNEE 20 THIGH 36 (SOFTGOODS) IMPLANT
KIT BASIN OR (CUSTOM PROCEDURE TRAY) ×1 IMPLANT
KIT TURNOVER KIT A (KITS) IMPLANT
MANIFOLD NEPTUNE II (INSTRUMENTS) ×1 IMPLANT
NS IRRIG 1000ML POUR BTL (IV SOLUTION) ×2 IMPLANT
PACK TOTAL KNEE CUSTOM (KITS) ×1 IMPLANT
PADDING CAST COTTON 6X4 STRL (CAST SUPPLIES) ×2 IMPLANT
PROTECTOR NERVE ULNAR (MISCELLANEOUS) ×1 IMPLANT
SET HNDPC FAN SPRY TIP SCT (DISPOSABLE) ×1 IMPLANT
STAPLER VISISTAT 35W (STAPLE) IMPLANT
SUT STRATAFIX 0 PDS 27 VIOLET (SUTURE) ×1
SUT VIC AB 2-0 CT1 27 (SUTURE) ×3
SUT VIC AB 2-0 CT1 TAPERPNT 27 (SUTURE) ×3 IMPLANT
SUTURE STRATFX 0 PDS 27 VIOLET (SUTURE) ×1 IMPLANT
SWAB COLLECTION DEVICE MRSA (MISCELLANEOUS) IMPLANT
SWAB CULTURE ESWAB REG 1ML (MISCELLANEOUS) IMPLANT
TOWER CARTRIDGE SMART MIX (DISPOSABLE) ×1 IMPLANT
TRAY FOLEY MTR SLVR 16FR STAT (SET/KITS/TRAYS/PACK) IMPLANT
WATER STERILE IRR 1000ML POUR (IV SOLUTION) ×1 IMPLANT
WRAP KNEE MAXI GEL POST OP (GAUZE/BANDAGES/DRESSINGS) ×2 IMPLANT

## 2022-10-08 NOTE — Op Note (Unsigned)
NAME: Logan Levine, Logan Levine MEDICAL RECORD NO: [id number] ACCOUNT NO: [id number] DATE OF BIRTH: 04/27/1950 FACILITY: [facility] PHYSICIAN: Gus Rankin. Addis Tuohy, MD  Operative Report   DATE OF PROCEDURE: 10/08/2022  PREOPERATIVE DIAGNOSIS:  Infected left total knee arthroplasty.  POSTOPERATIVE DIAGNOSIS:  Infected left total knee arthroplasty.  PROCEDURE:  Left knee resection arthroplasty and antibiotic spacer placement.  SURGEON:  Gus Rankin. Vineeth Fell, MD  ASSISTANT:  Arther Abbott, PA-C.  ANESTHESIA:  Spinal.  ESTIMATED BLOOD LOSS:  50 mL.  DRAINS:  None.  TOURNIQUET TIME:  103 minutes at 300 mmHg.  COMPLICATIONS:  None.  CONDITION:  Stable to recovery.  BRIEF CLINICAL NOTE:  The patient is a 73 year old male long complex history regards to his left knee.  He has had a previous infected total knee treated with a 2-stage revision and has had a recurrent infection several years later.  He had been treated  by the infectious disease clinic at [facility] with suppression, but unfortunately, recently, he has developed a sinus tract and is having purulent drainage.  He presents now for resection arthroplasty antibiotic spacer.  DESCRIPTION OF PROCEDURE:  After successful administration of spinal anesthetic, a tourniquet was placed on his left thigh and his left lower extremity prepped and draped in the usual sterile fashion.  Extremity was wrapped in Esmarch, tourniquet  inflated to 300 mmHg.  He only had about 20 degrees of flexion preoperatively.  Skin cut with a 10 blade through subcutaneous tissue through the extensor mechanism.  He had that sinus tract, which I excised.  We sent the pus, which was running out of the  knee for Gram stain, culture and sensitivity as well as aerobic and anaerobic cultures.  The soft tissue on the proximal medial tibia subperiosteally elevated to the joint line with a knife and into the semimembranosus bursa with a Cobb elevator.  Soft  tissue  laterally was elevated with attention being paid to protecting the patellar tendon on tibial tubercle.  I cut out a tremendous amount of scar tissue from under the extensor mechanism medial and lateral.  I then did a quadriceps snip was able to  sublux the patella.  We flexed the knee up to about 70 degrees and then subluxed the tibia anteriorly to remove the tibial polyethylene.  The tibia was then subluxed forward and circumferential retraction was obtained.  I used an oscillating saw to disrupt the interface between the tibial component and bone proximally.  It was a cemented revision with a stem as well as a cemented sleeve.   Osteotomes were used to help disrupt the interface between the component and bone.  Took quite a while to safely remove this and I had to remove the cement from the sleeve in order to get this component out.  It was removed with minimal bone loss and  then I used osteotomes to remove the cement from the tibial canal.  We sequentially removed all the cement down to the plastic cement plug and as identified and that is removed also.  We then thoroughly irrigated the tibial canal.  We then addressed the femur.  Osteotome was used to disrupt the interface between the femoral component and cement to remove as one solitary unit with no bone loss.  There was a lot of membranous tissue present over the bone and we debrided all that to  bone and normal tissue.  This debridement was performed throughout the joint.  There was a pedestal present in the canal, so  I drilled through that and then reamed up to 18 mm to clean the fibrinous tissue from the femoral canal.  We then thoroughly  irrigated the knee with 3 liters of saline with pulsatile lavage.  Further irrigation was then performed.  We then mixed four batches of gentamicin impregnated cement and created a spacer.  This was placed in extension and soft tissues were tensed and  the spacer was extremely stable.  He had great AP and  varus, valgus stability with the spacer in.  We then irrigated further and closed the extensor mechanism with a running 0 Stratafix suture.  I used #1 PDS to close the quadriceps snip.  Tourniquet was  released, total time 103 minutes.  Minimal bleeding was encountered.  Subcutaneous was closed with interrupted 2-0 Vicryl and skin with staples.  Incision was cleaned and dried and a bulky sterile dressing applied.  He was then placed into a knee  immobilizer, awakened, and transported to recovery in stable condition.  Note that a surgical assistant was of medical necessity for this procedure to do it in a safe and expeditious manner.  Surgical assistant was necessary for retraction of vital ligaments and neurovascular structures and for proper positioning of limb for  safe removal of the old implant and accurate and safe placement of the spacer.   PUS D: 10/08/2022 5:51:58 pm T: 10/08/2022 7:31:00 pm  JOB: [id number]/ [id number]

## 2022-10-08 NOTE — Anesthesia Procedure Notes (Signed)
Spinal  Start time: 10/08/2022 3:28 PM End time: 10/08/2022 3:30 PM Reason for block: surgical anesthesia Staffing Performed: resident/CRNA  Anesthesiologist: Lowella Curb, MD Resident/CRNA: Doran Clay, CRNA Performed by: Doran Clay, CRNA Authorized by: Lowella Curb, MD   Preanesthetic Checklist Completed: patient identified, IV checked, site marked, risks and benefits discussed, surgical consent, monitors and equipment checked, pre-op evaluation and timeout performed Spinal Block Patient position: sitting Prep: DuraPrep Patient monitoring: heart rate, cardiac monitor, continuous pulse ox and blood pressure Approach: midline Location: L3-4 Needle Needle type: Pencan  Needle gauge: 24 G Needle length: 10 cm Needle insertion depth: 7 cm Assessment Sensory level: T6 Events: CSF return Additional Notes Timeout performed. Patient in sitting position. L3-4 identified. Cleansed with Duraprep. SAB without difficulty. To supine position.

## 2022-10-08 NOTE — Anesthesia Preprocedure Evaluation (Signed)
Anesthesia Evaluation  Patient identified by MRN, date of birth, ID band Patient awake    Reviewed: Allergy & Precautions, H&P , NPO status , Patient's Chart, lab work & pertinent test results  Airway Mallampati: III  TM Distance: >3 FB Neck ROM: Full    Dental no notable dental hx. (+) Edentulous Upper, Edentulous Lower, Dental Advisory Given   Pulmonary COPD,  COPD inhaler, Patient abstained from smoking., former smoker   Pulmonary exam normal breath sounds clear to auscultation       Cardiovascular hypertension, Pt. on medications  Rhythm:Regular Rate:Normal     Neuro/Psych       Dementia negative neurological ROS     GI/Hepatic ,GERD  Medicated and Controlled,,(+) Cirrhosis       , Hepatitis -, C  Endo/Other  negative endocrine ROS    Renal/GU negative Renal ROS  negative genitourinary   Musculoskeletal  (+) Arthritis , Osteoarthritis,    Abdominal   Peds  Hematology  (+) Blood dyscrasia, anemia , HIV  Anesthesia Other Findings   Reproductive/Obstetrics negative OB ROS                             Anesthesia Physical Anesthesia Plan  ASA: III  Anesthesia Plan: Spinal   Post-op Pain Management: Regional block*   Induction: Intravenous  PONV Risk Score and Plan: 2 and Ondansetron, Midazolam and Treatment may vary due to age or medical condition  Airway Management Planned: Simple Face Mask  Additional Equipment:   Intra-op Plan:   Post-operative Plan:   Informed Consent: I have reviewed the patients History and Physical, chart, labs and discussed the procedure including the risks, benefits and alternatives for the proposed anesthesia with the patient or authorized representative who has indicated his/her understanding and acceptance.     Dental advisory given  Plan Discussed with: CRNA  Anesthesia Plan Comments:         Anesthesia Quick Evaluation

## 2022-10-08 NOTE — Plan of Care (Signed)

## 2022-10-08 NOTE — Brief Op Note (Signed)
10/08/2022  5:45 PM  PATIENT:  Logan Levine  72 y.o. male  PRE-OPERATIVE DIAGNOSIS:  infected left total knee arthroplasty  POST-OPERATIVE DIAGNOSIS:  infected left total knee arthroplasty  PROCEDURE:  Procedure(s): Resection arthroplasty left knee; antibiotic spacer (Left)  SURGEON:  Surgeons and Role:    Ollen Gross, MD - Primary  PHYSICIAN ASSISTANT:   ASSISTANTS: Arther Abbott, PA-C   ANESTHESIA:   regional and spinal  EBL:  50 mL   BLOOD ADMINISTERED:none  DRAINS: none   LOCAL MEDICATIONS USED:  NONE  COUNTS:  YES  TOURNIQUET:   Total Tourniquet Time Documented: Thigh (Left) - 103 minutes Total: Thigh (Left) - 103 minutes   DICTATION: .Other Dictation: Dictation Number [id number]  PLAN OF CARE: Admit to inpatient   PATIENT DISPOSITION:  PACU - hemodynamically stable.

## 2022-10-08 NOTE — Anesthesia Postprocedure Evaluation (Signed)
Anesthesia Post Note  Patient: Logan Levine  Procedure(s) Performed: Resection arthroplasty left knee; antibiotic spacer (Left: Knee)     Patient location during evaluation: PACU Anesthesia Type: Spinal Level of consciousness: awake and alert Pain management: pain level controlled Vital Signs Assessment: post-procedure vital signs reviewed and stable Respiratory status: spontaneous breathing, nonlabored ventilation and respiratory function stable Cardiovascular status: blood pressure returned to baseline and stable Postop Assessment: no apparent nausea or vomiting Anesthetic complications: no   No notable events documented.  Last Vitals:  Vitals:   10/08/22 1815 10/08/22 1830  BP: 109/88 127/82  Pulse: 92 83  Resp: 20 15  Temp:    SpO2: 96% 96%    Last Pain:  Vitals:   10/08/22 1815  TempSrc:   PainSc: 0-No pain                 Lowella Curb

## 2022-10-08 NOTE — Interval H&P Note (Signed)
History and Physical Interval Note:  10/08/2022 12:51 PM  Logan Levine  has presented today for surgery, with the diagnosis of infected left total knee arthroplasty.  The various methods of treatment have been discussed with the patient and family. After consideration of risks, benefits and other options for treatment, the patient has consented to  Procedure(s): Resection arthroplasty left knee; antibiotic spacer (Left) as a surgical intervention.  The patient's history has been reviewed, patient examined, no change in status, stable for surgery.  I have reviewed the patient's chart and labs.  Questions were answered to the patient's satisfaction.     Homero Fellers Orlo Brickle

## 2022-10-08 NOTE — Discharge Instructions (Addendum)
Ollen Gross, MD [facility] [address] [place], [place] [address] [phone]  KNEE POSTOPERATIVE DIRECTIONS  Knee Rehabilitation, Guidelines Following Surgery  Results after knee surgery are often greatly improved when you follow the exercise, range of motion and muscle strengthening exercises prescribed by your doctor. Safety measures are also important to protect the knee from further injury. If any of these exercises cause you to have increased pain or swelling in your knee joint, decrease the amount until you are comfortable again and slowly increase them. If you have problems or questions, call your caregiver or physical therapist for advice.   BLOOD CLOT PREVENTION Take a 2.5 mg Eliquis twice a day for three weeks following surgery. Then resume an 81 mg Aspirin once a day You may resume your vitamins/supplements once you have discontinued the Eliquis. Do not take any NSAIDs (Advil, Aleve, Ibuprofen, Meloxicam, etc.) until you have discontinued the Eliquis.   HOME CARE INSTRUCTIONS  Remove items at home which could result in a fall. This includes throw rugs or furniture in walking pathways.  ICE to the affected knee as much as tolerated. Icing helps control swelling. If the swelling is well controlled you will be more comfortable and rehab easier. Continue to use ice on the knee for pain and swelling from surgery. You may notice swelling that will progress down to the foot and ankle. This is normal after surgery. Elevate the leg when you are not up walking on it.    Continue to use the breathing machine which will help keep your temperature down. It is common for your temperature to cycle up and down following surgery, especially at night when you are not up moving around and exerting yourself. The breathing machine keeps your lungs expanded and your temperature down. Do not place pillow under the operative knee, focus on keeping  the knee straight while resting  DIET You may resume your previous home diet once you are discharged from the hospital.  DRESSING / WOUND CARE / SHOWERING Keep your bulky bandage on for 2 days. On the third post-operative day you may remove the Ace bandage and gauze. There is a waterproof adhesive bandage on your skin which will stay in place until your first follow-up appointment. Once you remove this you will not need to place another bandage You may begin showering 3 days following surgery, but do not submerge the incision under water.  ACTIVITY For the first 5 days, the key is rest and control of pain and swelling Do your home exercises twice a day starting on post-operative day 3. On the days you go to physical therapy, just do the home exercises once that day. You should rest, ice and elevate the leg for 50 minutes out of every hour. Get up and walk/stretch for 10 minutes per hour. After 5 days you can increase your activity slowly as tolerated. Walk with your walker as instructed. Use the walker until you are comfortable transitioning to a cane. Walk with the cane in the opposite hand of the operative leg. You may discontinue the cane once you are comfortable and walking steadily. Avoid periods of inactivity such as sitting longer than an hour when not asleep. This helps prevent blood clots.  You may discontinue the knee immobilizer once you are able to perform a straight leg raise while lying down. You may resume a sexual relationship in one month or when given the OK by your doctor.  You may  return to work once you are cleared by your doctor.  Do not drive a car for 6 weeks or until released by your surgeon.  Do not drive while taking narcotics.  TED HOSE STOCKINGS Wear the elastic stockings on both legs for three weeks following surgery during the day. You may remove them at night for sleeping.  WEIGHT BEARING Weight bearing as tolerated with assist device (walker, cane, etc) as  directed, use it as long as suggested by your surgeon or therapist, typically at least 4-6 weeks.  POSTOPERATIVE CONSTIPATION PROTOCOL Constipation - defined medically as fewer than three stools per week and severe constipation as less than one stool per week.  One of the most common issues patients have following surgery is constipation.  Even if you have a regular bowel pattern at home, your normal regimen is likely to be disrupted due to multiple reasons following surgery.  Combination of anesthesia, postoperative narcotics, change in appetite and fluid intake all can affect your bowels.  In order to avoid complications following surgery, here are some recommendations in order to help you during your recovery period.  Colace (docusate) - Pick up an over-the-counter form of Colace or another stool softener and take twice a day as long as you are requiring postoperative pain medications.  Take with a full glass of water daily.  If you experience loose stools or diarrhea, hold the colace until you stool forms back up. If your symptoms do not get better within 1 week or if they get worse, check with your doctor. Dulcolax (bisacodyl) - Pick up over-the-counter and take as directed by the product packaging as needed to assist with the movement of your bowels.  Take with a full glass of water.  Use this product as needed if not relieved by Colace only.  MiraLax (polyethylene glycol) - Pick up over-the-counter to have on hand. MiraLax is a solution that will increase the amount of water in your bowels to assist with bowel movements.  Take as directed and can mix with a glass of water, juice, soda, coffee, or tea. Take if you go more than two days without a movement. Do not use MiraLax more than once per day. Call your doctor if you are still constipated or irregular after using this medication for 7 days in a row.  If you continue to have problems with postoperative constipation, please contact the office for  further assistance and recommendations.  If you experience "the worst abdominal pain ever" or develop nausea or vomiting, please contact the office immediatly for further recommendations for treatment.  ITCHING If you experience itching with your medications, try taking only a single pain pill, or even half a pain pill at a time.  You can also use Benadryl over the counter for itching or also to help with sleep.   MEDICATIONS See your medication summary on the "After Visit Summary" that the nursing staff will review with you prior to discharge.  You may have some home medications which will be placed on hold until you complete the course of blood thinner medication.  It is important for you to complete the blood thinner medication as prescribed by your surgeon.  Continue your approved medications as instructed at time of discharge.  PRECAUTIONS If you experience chest pain or shortness of breath - call 911 immediately for transfer to the hospital emergency department.  If you develop a fever greater that 101 F, purulent drainage from wound, increased redness or drainage from wound, foul  odor from the wound/dressing, or calf pain - CONTACT YOUR SURGEON.                                                   FOLLOW-UP APPOINTMENTS Make sure you keep all of your appointments after your operation with your surgeon and caregivers. You should call the office at the above phone number and make an appointment for approximately two weeks after the date of your surgery or on the date instructed by your surgeon outlined in the "After Visit Summary".  RANGE OF MOTION AND STRENGTHENING EXERCISES  Rehabilitation of the knee is important following a knee injury or an operation. After just a few days of immobilization, the muscles of the thigh which control the knee become weakened and shrink (atrophy). Knee exercises are designed to build up the tone and strength of the thigh muscles and to improve knee motion. Often  times heat used for twenty to thirty minutes before working out will loosen up your tissues and help with improving the range of motion but do not use heat for the first two weeks following surgery. These exercises can be done on a training (exercise) mat, on the floor, on a table or on a bed. Use what ever works the best and is most comfortable for you Knee exercises include:  Leg Lifts - While your knee is still immobilized in a splint or cast, you can do straight leg raises. Lift the leg to 60 degrees, hold for 3 sec, and slowly lower the leg. Repeat 10-20 times 2-3 times daily. Perform this exercise against resistance later as your knee gets better.  Quad and Hamstring Sets - Tighten up the muscle on the front of the thigh (Quad) and hold for 5-10 sec. Repeat this 10-20 times hourly. Hamstring sets are done by pushing the foot backward against an object and holding for 5-10 sec. Repeat as with quad sets.  Leg Slides: Lying on your back, slowly slide your foot toward your buttocks, bending your knee up off the floor (only go as far as is comfortable). Then slowly slide your foot back down until your leg is flat on the floor again. Angel Wings: Lying on your back spread your legs to the side as far apart as you can without causing discomfort.  A rehabilitation program following serious knee injuries can speed recovery and prevent re-injury in the future due to weakened muscles. Contact your doctor or a physical therapist for more information on knee rehabilitation.   POST-OPERATIVE OPIOID TAPER INSTRUCTIONS: It is important to wean off of your opioid medication as soon as possible. If you do not need pain medication after your surgery it is ok to stop day one. Opioids include: Codeine, Hydrocodone(Norco, Vicodin), Oxycodone(Percocet, oxycontin) and hydromorphone amongst others.  Long term and even short term use of opiods can cause: Increased pain  response Dependence Constipation Depression Respiratory depression And more.  Withdrawal symptoms can include Flu like symptoms Nausea, vomiting And more Techniques to manage these symptoms Hydrate well Eat regular healthy meals Stay active Use relaxation techniques(deep breathing, meditating, yoga) Do Not substitute Alcohol to help with tapering If you have been on opioids for less than two weeks and do not have pain than it is ok to stop all together.  Plan to wean off of opioids This plan should start within one  week post op of your joint replacement. Maintain the same interval or time between taking each dose and first decrease the dose.  Cut the total daily intake of opioids by one tablet each day Next start to increase the time between doses. The last dose that should be eliminated is the evening dose.   IF YOU ARE TRANSFERRED TO A SKILLED REHAB FACILITY If the patient is transferred to a skilled rehab facility following release from the hospital, a list of the current medications will be sent to the facility for the patient to continue.  When discharged from the skilled rehab facility, please have the facility set up the patient's Home Health Physical Therapy prior to being released. Also, the skilled facility will be responsible for providing the patient with their medications at time of release from the facility to include their pain medication, the muscle relaxants, and their blood thinner medication. If the patient is still at the rehab facility at time of the two week follow up appointment, the skilled rehab facility will also need to assist the patient in arranging follow up appointment in our office and any transportation needs.  MAKE SURE YOU:  Understand these instructions.  Get help right away if you are not doing well or get worse.   DENTAL ANTIBIOTICS:  In most cases prophylactic antibiotics for Dental procdeures after total joint surgery are not  necessary.  Exceptions are as follows:  1. History of prior total joint infection  2. Severely immunocompromised (Organ Transplant, cancer chemotherapy, Rheumatoid biologic medications such as Humera)  3. Poorly controlled diabetes (A1C &gt; 8.0, blood glucose over 200)  If you have one of these conditions, contact your surgeon for an antibiotic prescription, prior to your dental procedure.    Pick up stool softner and laxative for home use following surgery while on pain medications. Do not submerge incision under water. Please use good hand washing techniques while changing dressing each day. May shower starting three days after surgery. Please use a clean towel to pat the incision dry following showers. Continue to use ice for pain and swelling after surgery. Do not use any lotions or creams on the incision until instructed by your surgeon.  Information on my medicine - ELIQUIS (apixaban)   Why was Eliquis prescribed for you? Eliquis was prescribed for you to reduce the risk of blood clots forming after orthopedic surgery.    What do You need to know about Eliquis? Take your Eliquis TWICE DAILY - one tablet in the morning and one tablet in the evening with or without food.  It would be best to take the dose about the same time each day.  If you have difficulty swallowing the tablet whole please discuss with your pharmacist how to take the medication safely.  Take Eliquis exactly as prescribed by your doctor and DO NOT stop taking Eliquis without talking to the doctor who prescribed the medication.  Stopping without other medication to take the place of Eliquis may increase your risk of developing a clot.  After discharge, you should have regular check-up appointments with your healthcare provider that is prescribing your Eliquis.  What do you do if you miss a dose? If a dose of ELIQUIS is not taken at the scheduled time, take it as soon as possible on the same day  and twice-daily administration should be resumed.  The dose should not be doubled to make up for a missed dose.  Do not take more than one tablet of ELIQUIS  at the same time.  Important Safety Information A possible side effect of Eliquis is bleeding. You should call your healthcare provider right away if you experience any of the following: Bleeding from an injury or your nose that does not stop. Unusual colored urine (red or dark brown) or unusual colored stools (red or black). Unusual bruising for unknown reasons. A serious fall or if you hit your head (even if there is no bleeding).  Some medicines may interact with Eliquis and might increase your risk of bleeding or clotting while on Eliquis. To help avoid this, consult your healthcare provider or pharmacist prior to using any new prescription or non-prescription medications, including herbals, vitamins, non-steroidal anti-inflammatory drugs (NSAIDs) and supplements.  This website has more information on Eliquis (apixaban): http://www.eliquis.com/eliquis/home

## 2022-10-08 NOTE — Anesthesia Procedure Notes (Signed)
Anesthesia Regional Block: Adductor canal block   Pre-Anesthetic Checklist: , timeout performed,  Correct Patient, Correct Site, Correct Laterality,  Correct Procedure, Correct Position, site marked,  Risks and benefits discussed,  Surgical consent,  Pre-op evaluation,  At surgeon's request and post-op pain management  Laterality: Left  Prep: chloraprep       Needles:  Injection technique: Single-shot  Needle Type: Stimiplex     Needle Length: 9cm  Needle Gauge: 21     Additional Needles:   Procedures:,,,, ultrasound used (permanent image in chart),,    Narrative:  Start time: 10/08/2022 2:36 PM End time: 10/08/2022 2:41 PM Injection made incrementally with aspirations every 5 mL.  Performed by: Personally  Anesthesiologist: Lowella Curb, MD

## 2022-10-08 NOTE — Transfer of Care (Signed)
Immediate Anesthesia Transfer of Care Note  Patient: Logan Levine  Procedure(s) Performed: Resection arthroplasty left knee; antibiotic spacer (Left: Knee)  Patient Location: PACU  Anesthesia Type:Spinal  Level of Consciousness: drowsy and patient cooperative  Airway & Oxygen Therapy: Patient Spontanous Breathing and Patient connected to face mask oxygen  Post-op Assessment: Report given to RN and Post -op Vital signs reviewed and stable  Post vital signs: Reviewed and stable  Last Vitals:  Vitals Value Taken Time  BP 110/78 10/08/22 1810  Temp    Pulse 94 10/08/22 1813  Resp 19 10/08/22 1813  SpO2 94 % 10/08/22 1813  Vitals shown include unfiled device data.  Last Pain:  Vitals:   10/08/22 1439  TempSrc:   PainSc: 0-No pain         Complications: No notable events documented.

## 2022-10-09 ENCOUNTER — Encounter (HOSPITAL_COMMUNITY): Payer: Self-pay | Admitting: Orthopedic Surgery

## 2022-10-09 ENCOUNTER — Other Ambulatory Visit: Payer: Self-pay

## 2022-10-09 DIAGNOSIS — M009 Pyogenic arthritis, unspecified: Secondary | ICD-10-CM | POA: Diagnosis not present

## 2022-10-09 DIAGNOSIS — T8450XA Infection and inflammatory reaction due to unspecified internal joint prosthesis, initial encounter: Secondary | ICD-10-CM | POA: Diagnosis not present

## 2022-10-09 LAB — BASIC METABOLIC PANEL
Anion gap: 6 (ref 5–15)
BUN: 26 mg/dL — ABNORMAL HIGH (ref 8–23)
CO2: 20 mmol/L — ABNORMAL LOW (ref 22–32)
Calcium: 8.9 mg/dL (ref 8.9–10.3)
Chloride: 105 mmol/L (ref 98–111)
Creatinine, Ser: 1.28 mg/dL — ABNORMAL HIGH (ref 0.61–1.24)
GFR, Estimated: 59 mL/min — ABNORMAL LOW (ref >60)
Glucose, Bld: 191 mg/dL — ABNORMAL HIGH (ref 70–99)
Potassium: 5.5 mmol/L — ABNORMAL HIGH (ref 3.5–5.1)
Sodium: 131 mmol/L — ABNORMAL LOW (ref 135–145)

## 2022-10-09 LAB — CBC
HCT: 28.5 % — ABNORMAL LOW (ref 39.0–52.0)
Hemoglobin: 8.7 g/dL — ABNORMAL LOW (ref 13.0–17.0)
MCH: 24.9 pg — ABNORMAL LOW (ref 26.0–34.0)
MCHC: 30.5 g/dL (ref 30.0–36.0)
MCV: 81.7 fL (ref 80.0–100.0)
Platelets: 216 10*3/uL (ref 150–400)
RBC: 3.49 MIL/uL — ABNORMAL LOW (ref 4.22–5.81)
RDW: 18.6 % — ABNORMAL HIGH (ref 11.5–15.5)
WBC: 6.7 10*3/uL (ref 4.0–10.5)
nRBC: 0 % (ref 0.0–0.2)

## 2022-10-09 MED ORDER — SODIUM CHLORIDE 0.9% FLUSH
10.0000 mL | INTRAVENOUS | Status: DC | PRN
  Administered 2022-10-11: 10 mL

## 2022-10-09 MED ORDER — VANCOMYCIN HCL 1250 MG/250ML IV SOLN
1250.0000 mg | INTRAVENOUS | Status: DC
  Administered 2022-10-10: 1250 mg via INTRAVENOUS
  Filled 2022-10-09 (×2): qty 250

## 2022-10-09 MED ORDER — VANCOMYCIN HCL 1500 MG/300ML IV SOLN
1500.0000 mg | Freq: Once | INTRAVENOUS | Status: AC
  Administered 2022-10-09: 1500 mg via INTRAVENOUS
  Filled 2022-10-09: qty 300

## 2022-10-09 MED ORDER — SODIUM CHLORIDE 0.9 % IV SOLN
2.0000 g | Freq: Every day | INTRAVENOUS | Status: DC
  Administered 2022-10-09 – 2022-10-11 (×3): 2 g via INTRAVENOUS
  Filled 2022-10-09 (×3): qty 20

## 2022-10-09 MED ORDER — CHLORHEXIDINE GLUCONATE CLOTH 2 % EX PADS
6.0000 | MEDICATED_PAD | Freq: Every day | CUTANEOUS | Status: DC
  Administered 2022-10-09 – 2022-10-11 (×3): 6 via TOPICAL

## 2022-10-09 NOTE — Progress Notes (Signed)
Peripherally Inserted Central Catheter Placement  The IV Nurse has discussed with the patient and/or persons authorized to consent for the patient, the purpose of this procedure and the potential benefits and risks involved with this procedure.  The benefits include less needle sticks, lab draws from the catheter, and the patient may be discharged home with the catheter. Risks include, but not limited to, infection, bleeding, blood clot (thrombus formation), and puncture of an artery; nerve damage and irregular heartbeat and possibility to perform a PICC exchange if needed/ordered by physician.  Alternatives to this procedure were also discussed.  Bard Power PICC patient education guide, fact sheet on infection prevention and patient information card has been provided to patient /or left at bedside.    PICC Placement Documentation  PICC Single Lumen 10/09/22 Right Basilic 36 cm 0 cm (Active)  Indication for Insertion or Continuance of Line Home intravenous therapies (PICC only) 10/09/22 1512  Exposed Catheter (cm) 0 cm 10/09/22 1512  Site Assessment Clean, Dry, Intact 10/09/22 1512  Line Status Flushed;Saline locked;Blood return noted 10/09/22 1512  Dressing Type Transparent;Securing device 10/09/22 1512  Dressing Status Antimicrobial disc in place;Clean, Dry, Intact 10/09/22 1512  Line Adjustment (NICU/IV Team Only) No 10/09/22 1512  Dressing Intervention New dressing;Adhesive placed at insertion site (IV team only);Other (Comment) 10/09/22 1512  Dressing Change Due 10/16/22 10/09/22 1512       Reginia Forts [place] 10/09/2022, 3:13 PM

## 2022-10-09 NOTE — Evaluation (Signed)
Physical Therapy Evaluation Patient Details Name: Logan Levine MRN: [id number] DOB: 10/29/1950 Today's Date: 10/09/2022  History of Present Illness  Pt is a 72 year old male s/p resection arthroplasty left knee; antibiotic spacer on 10/08/22.  PMHx multiple left knee surgeries, HIV, COPD  Clinical Impression  Patient is s/p above surgery resulting in functional limitations due to the deficits listed below (see PT Problem List).  Patient will benefit from acute skilled PT to increase their independence and safety with mobility to facilitate discharge.  Pt reports transferring to w/c and w/c mobility prior to surgery.  Pt now with KI and requiring assist for LEs with transfers.  Pt anticipates d/c to SNF stating he does not feel he can return home alone.  Patient will benefit from continued inpatient follow up therapy, <3 hours/day.          If plan is discharge home, recommend the following: A lot of help with walking and/or transfers;A lot of help with bathing/dressing/bathroom;Help with stairs or ramp for entrance;Assist for transportation;Assistance with cooking/housework   Can travel by private vehicle   No    Equipment Recommendations None recommended by PT  Recommendations for Other Services       Functional Status Assessment Patient has had a recent decline in their functional status and demonstrates the ability to make significant improvements in function in a reasonable and predictable amount of time.     Precautions / Restrictions Precautions Precautions: Fall;Knee Precaution Comments: pt reports he also wears a brace for right knee at baseline "to straighten it" Required Braces or Orthoses: Knee Immobilizer - Left Restrictions LLE Weight Bearing: Weight bearing as tolerated      Mobility  Bed Mobility Overal bed mobility: Needs Assistance Bed Mobility: Supine to Sit     Supine to sit: Min assist, HOB elevated, Used rails     General bed mobility comments:  assist for Lt LE    Transfers Overall transfer level: Needs assistance   Transfers: Bed to chair/wheelchair/BSC             General transfer comment: pt unable to stand with RW, drop arm piece of recliner not working so educated pt on anterior-posterior transfer into recliner, cues for technique and assist for positioning/moving Lt LE and occasionally Rt LE, multiple short rest breaks required due to fatigue and dyspnea, SPO2 97% on room air    Ambulation/Gait               General Gait Details: nonambulatory prior to surgery  Stairs            Wheelchair Mobility     Tilt Bed    Modified Rankin (Stroke Patients Only)       Balance Overall balance assessment: Needs assistance Sitting-balance support: No upper extremity supported, Feet supported Sitting balance-Leahy Scale: Good                                       Pertinent Vitals/Pain Pain Assessment Pain Assessment: Faces Faces Pain Scale: Hurts even more Pain Location: Left knee Pain Descriptors / Indicators: Grimacing, Guarding Pain Intervention(s): Monitored during session, Repositioned, Premedicated before session    Home Living Family/patient expects to be discharged to:: Skilled nursing facility Living Arrangements: Alone   Type of Home: House Home Access: Ramped entrance       Home Layout: One level Home Equipment: Wheelchair - manual  Prior Function Prior Level of Function : Independent/Modified Independent             Mobility Comments: transfers only, w/c level, uses UEs for propulsion ADLs Comments: bathing at sink     Extremity/Trunk Assessment   Upper Extremity Assessment Upper Extremity Assessment: Generalized weakness    Lower Extremity Assessment Lower Extremity Assessment: Generalized weakness;LLE deficits/detail LLE Deficits / Details: pt requesting assist for movement and support, maintained KI today LLE: Unable to fully assess due to  pain       Communication   Communication Communication: No apparent difficulties  Cognition Arousal: Alert Behavior During Therapy: WFL for tasks assessed/performed Overall Cognitive Status: Within Functional Limits for tasks assessed                                 General Comments: hx dementia but appropriate and following simple verbal commands        General Comments      Exercises     Assessment/Plan    PT Assessment Patient needs continued PT services  PT Problem List Decreased strength;Decreased activity tolerance;Decreased mobility;Decreased knowledge of precautions;Decreased knowledge of use of DME;Pain       PT Treatment Interventions DME instruction;Balance training;Functional mobility training;Therapeutic activities;Therapeutic exercise;Patient/family education;Wheelchair mobility training    PT Goals (Current goals can be found in the Care Plan section)  Acute Rehab PT Goals PT Goal Formulation: With patient Time For Goal Achievement: 10/23/22 Potential to Achieve Goals: Good    Frequency Min 5X/week     Co-evaluation               AM-PAC PT "6 Clicks" Mobility  Outcome Measure Help needed turning from your back to your side while in a flat bed without using bedrails?: A Little Help needed moving from lying on your back to sitting on the side of a flat bed without using bedrails?: A Little Help needed moving to and from a bed to a chair (including a wheelchair)?: A Lot Help needed standing up from a chair using your arms (e.g., wheelchair or bedside chair)?: Total Help needed to walk in hospital room?: Total Help needed climbing 3-5 steps with a railing? : Total 6 Click Score: 11    End of Session Equipment Utilized During Treatment: Left knee immobilizer Activity Tolerance: Patient limited by fatigue Patient left: in chair;with chair alarm set;with call bell/phone within reach   PT Visit Diagnosis: Other abnormalities of gait  and mobility (R26.89)    Time: 8119-1478 PT Time Calculation (min) (ACUTE ONLY): 29 min   Charges:   PT Evaluation $PT Eval Low Complexity: 1 Low PT Treatments $Therapeutic Activity: 8-22 mins PT General Charges $$ ACUTE PT VISIT: 1 Visit        Paulino Door, DPT Physical Therapist Acute Rehabilitation Services Office: [phone]   Janan Halter [place] 10/09/2022, 12:21 PM

## 2022-10-09 NOTE — Plan of Care (Signed)
  Problem: Education: Goal: Knowledge of the prescribed therapeutic regimen will improve Outcome: Progressing   Problem: Pain Management: Goal: Pain level will decrease with appropriate interventions Outcome: Progressing   Problem: Education: Goal: Knowledge of General Education information will improve Description: Including pain rating scale, medication(s)/side effects and non-pharmacologic comfort measures Outcome: Progressing   
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
## 2022-10-09 NOTE — Plan of Care (Signed)
  Problem: Education: Goal: Knowledge of the prescribed therapeutic regimen will improve Outcome: Progressing Goal: Individualized Educational Video(s) Outcome: Completed/Met   Problem: Activity: Goal: Ability to avoid complications of mobility impairment will improve Outcome: Progressing Goal: Range of joint motion will improve Outcome: Progressing   Problem: Clinical Measurements: Goal: Postoperative complications will be avoided or minimized Outcome: Progressing   Problem: Pain Management: Goal: Pain level will decrease with appropriate interventions Outcome: Progressing   Problem: Skin Integrity: Goal: Will show signs of wound healing Outcome: Progressing   Problem: Education: Goal: Knowledge of General Education information will improve Description: Including pain rating scale, medication(s)/side effects and non-pharmacologic comfort measures Outcome: Progressing   Problem: Health Behavior/Discharge Planning: Goal: Ability to manage health-related needs will improve Outcome: Progressing   Problem: Clinical Measurements: Goal: Ability to maintain clinical measurements within normal limits will improve Outcome: Progressing Goal: Will remain free from infection Outcome: Progressing Goal: Diagnostic test results will improve Outcome: Progressing Goal: Respiratory complications will improve Outcome: Progressing Goal: Cardiovascular complication will be avoided Outcome: Progressing   Problem: Activity: Goal: Risk for activity intolerance will decrease Outcome: Progressing   Problem: Nutrition: Goal: Adequate nutrition will be maintained Outcome: Completed/Met   Problem: Coping: Goal: Level of anxiety will decrease Outcome: Progressing   Problem: Elimination: Goal: Will not experience complications related to bowel motility Outcome: Progressing Goal: Will not experience complications related to urinary retention Outcome: Progressing   Problem: Pain  Managment: Goal: General experience of comfort will improve Outcome: Progressing   Problem: Safety: Goal: Ability to remain free from injury will improve Outcome: Progressing   Problem: Skin Integrity: Goal: Risk for impaired skin integrity will decrease Outcome: Progressing

## 2022-10-09 NOTE — Progress Notes (Signed)
Pharmacy Antibiotic Note  Logan Levine is a 72 y.o. male admitted on 10/08/2022 with  recurrent left knee prosthetic joint infection .  Pharmacy has been consulted for vancomycin dosing.  PMH of HIV (last CD4 154 in 2/24), CKD2 WBC 6.7, Scr 1.28, cultures pending History of corynebacterium PJI treated with vancomycin and transitioned to doxycycline for long term suppression  Plan: Vancomycin 1500 mg IV x1 loading dose Vancomycin 1250 mg IV every 24 hours.  Goal trough 10-15 mcg/mL. eAUC 431, Goal AUC 400-600 Monitor renal function, CBC, cultures, and signs of clinical improvement Obtain levels as clinically necessary per protocol  Height: 5\' 8"  (172.7 cm) Weight: 87 kg (191 lb 12.8 oz) IBW/kg (Calculated) : 68.4  Temp (24hrs), Avg:98 F (36.7 C), Min:97.5 F (36.4 C), Max:99.3 F (37.4 C)  Recent Labs  Lab 10/09/22 0347  WBC 6.7  CREATININE 1.28*    Estimated Creatinine Clearance: 55.9 mL/min (A) (by C-G formula based on SCr of 1.28 mg/dL (H)).    No Known Allergies  Antimicrobials this admission:  Cefazolin 9/4>>9/5 Doxycycline 9/4>>9/5 9/5 ceftriaxone >>  9/5 vancomycin >>  9/5 bactrim>>  Antivirals: 9/5 valacyclovir>> 9/4 Descovy>> 9/4 Isentress>>  Microbiology results:  9/4 surgical deep wound: no growth < 12 hours 8/28 MRSA PCR: negative  Thank you for allowing pharmacy to be a part of this patient's care.  Stephenie Acres, PharmD PGY1 Pharmacy Resident 10/09/2022 12:44 PM

## 2022-10-09 NOTE — NC FL2 (Signed)
[place] MEDICAID FL2 LEVEL OF CARE FORM     IDENTIFICATION  Patient Name: Logan Levine Birthdate: 1950-11-29 Sex: male Admission Date (Current Location): 10/08/2022  [place] and [place] Number:  Haynes Bast [id number] Facility and Address:  [facility],  [address], [place] [address]      Provider Number: [id number]  Attending Physician Name and Address:  Ollen Gross, MD  Relative Name and Phone Number:  Chandra Batch: [phone]    Current Level of Care: Hospital Recommended Level of Care: Skilled Nursing Facility Prior Approval Number:    Date Approved/Denied:   PASRR Number: [id number]  Discharge Plan: SNF    Current Diagnoses: Patient Active Problem List   Diagnosis Date Noted   Infection of prosthetic joint (HCC) 10/08/2022   Traumatic hematoma of right knee 10/19/2017   Septic arthritis of knee, left (HCC) 05/04/2017   Hepatic cirrhosis due to chronic hepatitis C infection (HCC) 10/29/2016   Constipation 10/29/2016   TOTAL KNEE REPLACEMENT, LEFT, HX OF 11/12/2009   KNEE PAIN, ACUTE 11/01/2008   DEMENTIA, MILD 11/15/2007   PYELONEPHRITIS, ACUTE 05/28/2007   HERPES, GENITAL NEC 11/19/2006   GOUT NOS 11/19/2006   SMOKER 11/19/2006   HYPERTENSION, BENIGN ESSENTIAL 11/19/2006   BRONCHITIS NOS 11/19/2006   BRONCHITIS, CHRONIC NEC 11/19/2006   HERNIA, UMBILICAL 11/19/2006   HIV DISEASE 07/03/2006   HEPATITIS C 07/03/2006   SYPHILIS, EARLY, SYMPTOMATIC, PRIMARY NEC 07/03/2006   OSTEOARTHRITIS 07/03/2006    Orientation RESPIRATION BLADDER Height & Weight     Self, Time, Situation, Place  Normal Continent Weight: 191 lb 12.8 oz (87 kg) Height:  5\' 8"  (172.7 cm)  BEHAVIORAL SYMPTOMS/MOOD NEUROLOGICAL BOWEL NUTRITION STATUS     (N/A) Continent Diet (Regular diet)  AMBULATORY STATUS COMMUNICATION OF NEEDS Skin   Total Care (Patient is nonambulatory.) Verbally Surgical wounds                       Personal Care Assistance  Level of Assistance  Bathing, Feeding, Dressing Bathing Assistance: Limited assistance Feeding assistance: Independent Dressing Assistance: Limited assistance     Functional Limitations Info  Sight, Hearing, Speech Sight Info: Impaired Hearing Info: Adequate Speech Info: Adequate    SPECIAL CARE FACTORS FREQUENCY  PT (By licensed PT), OT (By licensed OT)     PT Frequency: 5x's/week OT Frequency: 5x's/week            Contractures Contractures Info: Not present    Additional Factors Info  Code Status, Allergies Code Status Info: Full Allergies Info: NKA           Current Medications (10/09/2022):  This is the current hospital active medication list Current Facility-Administered Medications  Medication Dose Route Frequency Provider Last Rate Last Admin   0.9 %  sodium chloride infusion   Intravenous Continuous Eartha Inch, PA 75 mL/hr at 10/08/22 1902 Infusion Verify at 10/08/22 1902   acetaminophen (TYLENOL) tablet 325-650 mg  325-650 mg Oral Q6H PRN Eartha Inch, PA       albuterol (PROVENTIL) (2.5 MG/3ML) 0.083% nebulizer solution 2.5 mg  2.5 mg Inhalation Q6H PRN Eartha Inch, PA       apixaban (ELIQUIS) tablet 2.5 mg  2.5 mg Oral Q12H Eartha Inch, PA   2.5 mg at 10/09/22 8413   bisacodyl (DULCOLAX) suppository 10 mg  10 mg Rectal Daily PRN Eartha Inch, PA       budesonide (PULMICORT) nebulizer solution 0.25 mg  0.25 mg Nebulization BID Eartha Inch, PA  0.25 mg at 10/09/22 0853   cefTRIAXone (ROCEPHIN) 2 g in sodium chloride 0.9 % 100 mL IVPB  2 g Intravenous Daily Judyann Munson, MD 200 mL/hr at 10/09/22 1221 2 g at 10/09/22 1221   Chlorhexidine Gluconate Cloth 2 % PADS 6 each  6 each Topical Daily Ollen Gross, MD       diphenhydrAMINE (BENADRYL) 12.5 MG/5ML elixir 12.5-25 mg  12.5-25 mg Oral Q4H PRN Eartha Inch, PA       docusate sodium (COLACE) capsule 100 mg  100 mg Oral BID Eartha Inch, PA   100 mg at 10/09/22 0816    emtricitabine-tenofovir AF (DESCOVY) 200-25 MG per tablet 1 tablet  1 tablet Oral Daily Eartha Inch, PA   1 tablet at 10/09/22 0813   influenza vaccine adjuvanted (FLUAD) injection 0.5 mL  0.5 mL Intramuscular Prior to discharge Ollen Gross, MD       menthol-cetylpyridinium (CEPACOL) lozenge 3 mg  1 lozenge Oral PRN Eartha Inch, PA       Or   phenol (CHLORASEPTIC) mouth spray 1 spray  1 spray Mouth/Throat PRN Eartha Inch, PA       methocarbamol (ROBAXIN) tablet 500 mg  500 mg Oral Q6H PRN Eartha Inch, PA   500 mg at 10/09/22 1324   Or   methocarbamol (ROBAXIN) 500 mg in dextrose 5 % 50 mL IVPB  500 mg Intravenous Q6H PRN Eartha Inch, PA       metoCLOPramide (REGLAN) tablet 5-10 mg  5-10 mg Oral Q8H PRN Eartha Inch, PA       Or   metoCLOPramide (REGLAN) injection 5-10 mg  5-10 mg Intravenous Q8H PRN Eartha Inch, PA       morphine (PF) 2 MG/ML injection 1-2 mg  1-2 mg Intravenous Q2H PRN Eartha Inch, PA   2 mg at 10/08/22 2143   ondansetron (ZOFRAN) tablet 4 mg  4 mg Oral Q6H PRN Eartha Inch, PA       Or   ondansetron (ZOFRAN) injection 4 mg  4 mg Intravenous Q6H PRN Eartha Inch, PA       oxyCODONE (Oxy IR/ROXICODONE) immediate release tablet 5-10 mg  5-10 mg Oral Q4H PRN Eartha Inch, PA   10 mg at 10/09/22 1219   pantoprazole (PROTONIX) EC tablet 40 mg  40 mg Oral Daily Eartha Inch, PA   40 mg at 10/09/22 8295   polyethylene glycol (MIRALAX / GLYCOLAX) packet 17 g  17 g Oral Daily PRN Eartha Inch, PA       raltegravir (ISENTRESS) tablet 400 mg  400 mg Oral BID Eartha Inch, PA   400 mg at 10/09/22 6213   sodium chloride flush (NS) 0.9 % injection 10-40 mL  10-40 mL Intracatheter PRN Aluisio, Homero Fellers, MD       sodium phosphate (FLEET) enema 1 enema  1 enema Rectal Once PRN Eartha Inch, PA       sulfamethoxazole-trimethoprim (BACTRIM) 400-80 MG per tablet 1 tablet  1 tablet Oral Daily Eartha Inch, PA    1 tablet at 10/09/22 0813   tamsulosin (FLOMAX) capsule 0.8 mg  0.8 mg Oral Daily Eartha Inch, PA   0.8 mg at 10/09/22 0811   traMADol (ULTRAM) tablet 50-100 mg  50-100 mg Oral Q6H PRN Eartha Inch, PA   100 mg at 10/09/22 0309   valACYclovir (VALTREX) tablet 500 mg  500 mg Oral Daily Eartha Inch, PA   500 mg at 10/09/22 0814   [START ON 10/10/2022] vancomycin (VANCOREADY) IVPB 1250 mg/250  mL  1,250 mg Intravenous Q24H Jacklynn Barnacle, RPH       vancomycin (VANCOREADY) IVPB 1500 mg/300 mL  1,500 mg Intravenous Once Ann Held, [facility] 150 mL/hr at 10/09/22 1323 1,500 mg at 10/09/22 1323     Discharge Medications: Please see discharge summary for a list of discharge medications.  Relevant Imaging Results:  Relevant Lab Results:   Additional Information SSN: [id number]. Patient is expected to discharge on 6 weeks IV antibiotics.  Ewing Schlein, LCSW
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
## 2022-10-09 NOTE — TOC Initial Note (Signed)
Transition of Care [facility]) - Initial/Assessment Note   Patient Details  Name: Logan Levine MRN: [id number] Date of Birth: 1950/10/04  Transition of Care [facility]) CM/SW Contact:    Ewing Schlein, LCSW Phone Number: 10/09/2022, 3:15 PM  Clinical Narrative: Patient is expected to need SNF for IV antibiotics after discharge. Patient confirmed he will need SNF and requested [facility]) as first choice.  FL2 done; PASRR confirmed. Initial referral faxed out. TOC awaiting bed offer.              Expected Discharge Plan: Skilled Nursing Facility Barriers to Discharge: Continued Medical Work up  Patient Goals and CMS Choice Patient states their goals for this hospitalization and ongoing recovery are:: Go to [facility] for rehab CMS Medicare.gov Compare Post Acute Care list provided to:: Patient Choice offered to / list presented to : Patient  Expected Discharge Plan and Services In-house Referral: Clinical Social Work Post Acute Care Choice: Skilled Nursing Facility Living arrangements for the past 2 months: Single Family Home             DME Arranged: N/A DME Agency: NA  Prior Living Arrangements/Services Living arrangements for the past 2 months: Single Family Home Lives with:: Self Patient language and need for interpreter reviewed:: Yes Do you feel safe going back to the place where you live?: Yes      Need for Family Participation in Patient Care: No (Comment) Care giver support system in place?: Yes (comment) Criminal Activity/Legal Involvement Pertinent to Current Situation/Hospitalization: No - Comment as needed  Activities of Daily Living Home Assistive Devices/Equipment: Environmental consultant (specify type), Wheelchair ADL Screening (condition at time of admission) Patient's cognitive ability adequate to safely complete daily activities?: Yes Is the patient deaf or have difficulty hearing?: No Does the patient have difficulty seeing, even when wearing  glasses/contacts?: No Does the patient have difficulty concentrating, remembering, or making decisions?: No Patient able to express need for assistance with ADLs?: Yes Does the patient have difficulty dressing or bathing?: No Independently performs ADLs?: Yes (appropriate for developmental age) Does the patient have difficulty walking or climbing stairs?: Yes Weakness of Legs: Both Weakness of Arms/Hands: None  Permission Sought/Granted Permission sought to share information with : Facility Industrial/product designer granted to share information with : Yes, Verbal Permission Granted Permission granted to share info w AGENCY: [facility]  Emotional Assessment Appearance:: Appears stated age Attitude/Demeanor/Rapport: Engaged Affect (typically observed): Accepting Orientation: : Oriented to Self, Oriented to Place, Oriented to  Time, Oriented to Situation Alcohol / Substance Use: Not Applicable Psych Involvement: No (comment)  Admission diagnosis:  Infection of prosthetic joint (HCC) [T84.50XA] Patient Active Problem List   Diagnosis Date Noted   Infection of prosthetic joint (HCC) 10/08/2022   Traumatic hematoma of right knee 10/19/2017   Septic arthritis of knee, left (HCC) 05/04/2017   Hepatic cirrhosis due to chronic hepatitis C infection (HCC) 10/29/2016   Constipation 10/29/2016   TOTAL KNEE REPLACEMENT, LEFT, HX OF 11/12/2009   KNEE PAIN, ACUTE 11/01/2008   DEMENTIA, MILD 11/15/2007   PYELONEPHRITIS, ACUTE 05/28/2007   HERPES, GENITAL NEC 11/19/2006   GOUT NOS 11/19/2006   SMOKER 11/19/2006   HYPERTENSION, BENIGN ESSENTIAL 11/19/2006   BRONCHITIS NOS 11/19/2006   BRONCHITIS, CHRONIC NEC 11/19/2006   HERNIA, UMBILICAL 11/19/2006   HIV DISEASE 07/03/2006   HEPATITIS C 07/03/2006   SYPHILIS, EARLY, SYMPTOMATIC, PRIMARY NEC 07/03/2006   OSTEOARTHRITIS 07/03/2006   PCP:  Lia Hopping  A, MD Pharmacy:   [facility] - [place], [place] - [address]  [address] [place] [address] Phone: [phone] Fax: [phone]  [facility] [place], [place] - [address] [place] [address] Phone: [phone] Fax: [phone]  Social Determinants of Health (SDOH) Social History: SDOH Screenings   Food Insecurity: No Food Insecurity (10/08/2022)  Housing: Low Risk  (10/08/2022)  Transportation Needs: No Transportation Needs (10/08/2022)  Recent Concern: Transportation Needs - Unmet Transportation Needs (09/15/2022)   Received from [facility]  Utilities: Not At Risk (10/08/2022)  Financial Resource Strain: Low Risk  (06/09/2020)   Received from [facility], [facility]  Tobacco Use: Medium Risk (10/08/2022)   SDOH Interventions:    Readmission Risk Interventions     No data to display

## 2022-10-10 ENCOUNTER — Other Ambulatory Visit (HOSPITAL_COMMUNITY): Payer: Self-pay

## 2022-10-10 LAB — BASIC METABOLIC PANEL
Anion gap: 7 (ref 5–15)
BUN: 32 mg/dL — ABNORMAL HIGH (ref 8–23)
CO2: 21 mmol/L — ABNORMAL LOW (ref 22–32)
Calcium: 8.7 mg/dL — ABNORMAL LOW (ref 8.9–10.3)
Chloride: 103 mmol/L (ref 98–111)
Creatinine, Ser: 1.29 mg/dL — ABNORMAL HIGH (ref 0.61–1.24)
GFR, Estimated: 59 mL/min — ABNORMAL LOW (ref >60)
Glucose, Bld: 128 mg/dL — ABNORMAL HIGH (ref 70–99)
Potassium: 5.4 mmol/L — ABNORMAL HIGH (ref 3.5–5.1)
Sodium: 131 mmol/L — ABNORMAL LOW (ref 135–145)

## 2022-10-10 LAB — CBC
HCT: 27.6 % — ABNORMAL LOW (ref 39.0–52.0)
Hemoglobin: 8.3 g/dL — ABNORMAL LOW (ref 13.0–17.0)
MCH: 25 pg — ABNORMAL LOW (ref 26.0–34.0)
MCHC: 30.1 g/dL (ref 30.0–36.0)
MCV: 83.1 fL (ref 80.0–100.0)
Platelets: 249 10*3/uL (ref 150–400)
RBC: 3.32 MIL/uL — ABNORMAL LOW (ref 4.22–5.81)
RDW: 19.1 % — ABNORMAL HIGH (ref 11.5–15.5)
WBC: 12.5 10*3/uL — ABNORMAL HIGH (ref 4.0–10.5)
nRBC: 0 % (ref 0.0–0.2)

## 2022-10-10 MED ORDER — OXYCODONE HCL 5 MG PO TABS: 5.0000 mg | ORAL_TABLET | Freq: Four times a day (QID) | ORAL | 0 refills | Status: DC | PRN

## 2022-10-10 MED ORDER — POLYETHYLENE GLYCOL 3350 17 GM/SCOOP PO POWD
17.0000 g | Freq: Every day | ORAL | 0 refills | Status: DC | PRN
  Filled 2022-10-10: qty 238, 14d supply, fill #0

## 2022-10-10 MED ORDER — APIXABAN 2.5 MG PO TABS
2.5000 mg | ORAL_TABLET | Freq: Two times a day (BID) | ORAL | 0 refills | Status: DC
  Filled 2022-10-10: qty 38, 19d supply, fill #0

## 2022-10-10 MED ORDER — METHOCARBAMOL 500 MG PO TABS: 500.0000 mg | ORAL_TABLET | Freq: Four times a day (QID) | ORAL | 0 refills | Status: DC | PRN

## 2022-10-10 MED ORDER — TRAZODONE HCL 50 MG PO TABS
50.0000 mg | ORAL_TABLET | Freq: Every evening | ORAL | Status: DC | PRN
  Administered 2022-10-10: 50 mg via ORAL
  Filled 2022-10-10: qty 1

## 2022-10-10 MED ORDER — VANCOMYCIN IV (FOR PTA / DISCHARGE USE ONLY): 1250.0000 mg | INTRAVENOUS | Status: DC

## 2022-10-10 MED ORDER — CEFTRIAXONE IV (FOR PTA / DISCHARGE USE ONLY): 2.0000 g | INTRAVENOUS | Status: DC

## 2022-10-10 MED ORDER — ONDANSETRON HCL 4 MG PO TABS
4.0000 mg | ORAL_TABLET | Freq: Four times a day (QID) | ORAL | 0 refills | Status: DC | PRN
  Filled 2022-10-10: qty 20, 5d supply, fill #0

## 2022-10-10 MED ORDER — TRAMADOL HCL 50 MG PO TABS: 50.0000 mg | ORAL_TABLET | Freq: Four times a day (QID) | ORAL | 0 refills | Status: DC | PRN

## 2022-10-10 NOTE — Plan of Care (Signed)

## 2022-10-10 NOTE — Plan of Care (Signed)
  Problem: Education: Goal: Knowledge of the prescribed therapeutic regimen will improve Outcome: Progressing   Problem: Activity: Goal: Ability to avoid complications of mobility impairment will improve Outcome: Progressing Goal: Range of joint motion will improve Outcome: Progressing   Problem: Clinical Measurements: Goal: Postoperative complications will be avoided or minimized Outcome: Progressing   Problem: Pain Management: Goal: Pain level will decrease with appropriate interventions Outcome: Progressing   Problem: Skin Integrity: Goal: Will show signs of wound healing Outcome: Progressing   Problem: Education: Goal: Knowledge of General Education information will improve Description: Including pain rating scale, medication(s)/side effects and non-pharmacologic comfort measures Outcome: Progressing   Problem: Health Behavior/Discharge Planning: Goal: Ability to manage health-related needs will improve Outcome: Progressing   Problem: Clinical Measurements: Goal: Ability to maintain clinical measurements within normal limits will improve Outcome: Progressing Goal: Will remain free from infection Outcome: Progressing Goal: Diagnostic test results will improve Outcome: Progressing Goal: Respiratory complications will improve Outcome: Progressing Goal: Cardiovascular complication will be avoided Outcome: Progressing   Problem: Activity: Goal: Risk for activity intolerance will decrease Outcome: Progressing   Problem: Coping: Goal: Level of anxiety will decrease Outcome: Progressing   Problem: Elimination: Goal: Will not experience complications related to bowel motility Outcome: Progressing Goal: Will not experience complications related to urinary retention Outcome: Completed/Met   Problem: Pain Managment: Goal: General experience of comfort will improve Outcome: Progressing   Problem: Safety: Goal: Ability to remain free from injury will  improve Outcome: Progressing   Problem: Skin Integrity: Goal: Risk for impaired skin integrity will decrease Outcome: Progressing

## 2022-10-10 NOTE — Progress Notes (Signed)
PHARMACY CONSULT NOTE FOR:  OUTPATIENT  PARENTERAL ANTIBIOTIC THERAPY (OPAT)  Indication: prosthetic joint infection Regimen:  - vancomycin 1250 mg IV every 24 hours - ceftriaxone 2 g IV every 24 hours End date: 11/19/2022  IV antibiotic discharge orders are pended. To discharging provider:  please sign these orders via discharge navigator,  Select New Orders & click on the button choice - Manage This Unsigned Work.     Thank you for allowing pharmacy to be a part of this patient's care.   Stephenie Acres, PharmD PGY1 Pharmacy Resident 10/10/2022 3:22 PM

## 2022-10-10 NOTE — TOC Progression Note (Signed)
Transition of Care [facility]) - Progression Note   Patient Details  Name: Logan Levine MRN: [id number] Date of Birth: 1950/07/05  Transition [facility]) CM/SW Contact  Ewing Schlein, LCSW Phone Number: 10/10/2022, 3:32 PM  Clinical Narrative: CSW confirmed bed with [facility] in admissions [address] (formerly [facility]). Patient can be admitted tomorrow and the facility can provide the IV antibiotics recommended by ID. CSW updated patient.  Per [facility], the patient will need to be in the facility before noon tomorrow so transportation must be scheduled first thing in the morning. CSW updated TOC weekend staff.    Expected Discharge Plan: Skilled Nursing Facility Barriers to Discharge: Continued Medical Work up  Expected Discharge Plan and Services In-house Referral: Clinical Social Work Post Acute Care Choice: Skilled Nursing Facility Living arrangements for the past 2 months: Single Family Home Expected Discharge Date: 10/11/22               DME Arranged: N/A DME Agency: NA  Social Determinants of Health (SDOH) Interventions SDOH Screenings   Food Insecurity: No Food Insecurity (10/08/2022)  Housing: Low Risk  (10/08/2022)  Transportation Needs: No Transportation Needs (10/08/2022)  Recent Concern: Transportation Needs - Unmet Transportation Needs (09/15/2022)   Received from [facility]  Utilities: Not At Risk (10/08/2022)  Financial Resource Strain: Low Risk  (06/09/2020)   Received from [facility]  Tobacco Use: Medium Risk (10/08/2022)   Readmission Risk Interventions     No data to display

## 2022-10-10 NOTE — Progress Notes (Signed)
Physical Therapy Treatment Patient Details Name: Logan Levine MRN: [id number] DOB: 03-26-1950 Today's Date: 10/10/2022   History of Present Illness Pt is a 72 year old male s/p resection arthroplasty left knee; antibiotic spacer on 10/08/22.  PMHx multiple left knee surgeries, HIV, COPD    PT Comments  Pt attempted to perform bed mobility to sit EOB however requiring increased assist and having more pain today.  Pt unable to completely sit upright with LEs over EOB and would require total or +2 assist to complete transfer.  Pt requested return to supine due to pain.  Current d/c plan is for SNF.     If plan is discharge home, recommend the following: A lot of help with walking and/or transfers;A lot of help with bathing/dressing/bathroom;Help with stairs or ramp for entrance;Assist for transportation;Assistance with cooking/housework   Can travel by private vehicle     No  Equipment Recommendations  None recommended by PT    Recommendations for Other Services       Precautions / Restrictions Precautions Precautions: Fall;Knee Required Braces or Orthoses: Knee Immobilizer - Left Restrictions LLE Weight Bearing: Weight bearing as tolerated     Mobility  Bed Mobility Overal bed mobility: Needs Assistance Bed Mobility: Supine to Sit     Supine to sit: Max assist, Total assist     General bed mobility comments: requiring assist for lower body, provided with time and cues to assist with upper body however pt reports weak and sore UEs today; pt requested to stop mobilizing due to pain; unable to complete sitting EOB (would require total or +2 assist)    Transfers                        Ambulation/Gait                   Stairs             Wheelchair Mobility     Tilt Bed    Modified Rankin (Stroke Patients Only)       Balance                                            Cognition Arousal: Alert Behavior During Therapy:  WFL for tasks assessed/performed Overall Cognitive Status: Within Functional Limits for tasks assessed                                 General Comments: hx dementia but appropriate and following simple verbal commands; pt reports being unable to read        Exercises      General Comments        Pertinent Vitals/Pain Pain Assessment Pain Assessment: Faces Faces Pain Scale: Hurts whole lot Pain Location: Left knee Pain Descriptors / Indicators: Grimacing, Guarding, Aching, Sore Pain Intervention(s): Monitored during session, Repositioned, Patient requesting pain meds-RN notified    Home Living                          Prior Function            PT Goals (current goals can now be found in the care plan section) Progress towards PT goals: Progressing toward goals    Frequency    Min 5X/week  PT Plan      Co-evaluation              AM-PAC PT "6 Clicks" Mobility   Outcome Measure  Help needed turning from your back to your side while in a flat bed without using bedrails?: A Lot Help needed moving from lying on your back to sitting on the side of a flat bed without using bedrails?: A Lot Help needed moving to and from a bed to a chair (including a wheelchair)?: Total Help needed standing up from a chair using your arms (e.g., wheelchair or bedside chair)?: Total Help needed to walk in hospital room?: Total Help needed climbing 3-5 steps with a railing? : Total 6 Click Score: 8    End of Session Equipment Utilized During Treatment: Left knee immobilizer Activity Tolerance: Patient limited by pain Patient left: in bed;with call bell/phone within reach;with bed alarm set   PT Visit Diagnosis: Other abnormalities of gait and mobility (R26.89)     Time: 1610-9604 PT Time Calculation (min) (ACUTE ONLY): 14 min  Charges:    $Therapeutic Activity: 8-22 mins PT General Charges $$ ACUTE PT VISIT: 1 Visit                     Paulino Door, DPT Physical Therapist Acute Rehabilitation Services Office: [phone]    Janan Halter [place] 10/10/2022, 2:29 PM
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
## 2022-10-11 DIAGNOSIS — D8481 Immunodeficiency due to conditions classified elsewhere: Secondary | ICD-10-CM | POA: Diagnosis not present

## 2022-10-11 DIAGNOSIS — K56609 Unspecified intestinal obstruction, unspecified as to partial versus complete obstruction: Secondary | ICD-10-CM | POA: Diagnosis not present

## 2022-10-11 DIAGNOSIS — M1732 Unilateral post-traumatic osteoarthritis, left knee: Secondary | ICD-10-CM | POA: Diagnosis not present

## 2022-10-11 DIAGNOSIS — B20 Human immunodeficiency virus [HIV] disease: Secondary | ICD-10-CM | POA: Diagnosis not present

## 2022-10-11 DIAGNOSIS — Z6828 Body mass index (BMI) 28.0-28.9, adult: Secondary | ICD-10-CM | POA: Diagnosis not present

## 2022-10-11 DIAGNOSIS — I8510 Secondary esophageal varices without bleeding: Secondary | ICD-10-CM | POA: Diagnosis not present

## 2022-10-11 DIAGNOSIS — M009 Pyogenic arthritis, unspecified: Secondary | ICD-10-CM | POA: Diagnosis not present

## 2022-10-11 DIAGNOSIS — R5381 Other malaise: Secondary | ICD-10-CM | POA: Diagnosis not present

## 2022-10-11 DIAGNOSIS — K2081 Other esophagitis with bleeding: Secondary | ICD-10-CM | POA: Diagnosis not present

## 2022-10-11 DIAGNOSIS — R748 Abnormal levels of other serum enzymes: Secondary | ICD-10-CM | POA: Diagnosis present

## 2022-10-11 DIAGNOSIS — M1990 Unspecified osteoarthritis, unspecified site: Secondary | ICD-10-CM | POA: Diagnosis not present

## 2022-10-11 DIAGNOSIS — I129 Hypertensive chronic kidney disease with stage 1 through stage 4 chronic kidney disease, or unspecified chronic kidney disease: Secondary | ICD-10-CM | POA: Diagnosis not present

## 2022-10-11 DIAGNOSIS — K5730 Diverticulosis of large intestine without perforation or abscess without bleeding: Secondary | ICD-10-CM | POA: Diagnosis not present

## 2022-10-11 DIAGNOSIS — K7460 Unspecified cirrhosis of liver: Secondary | ICD-10-CM | POA: Diagnosis not present

## 2022-10-11 DIAGNOSIS — Z96659 Presence of unspecified artificial knee joint: Secondary | ICD-10-CM | POA: Diagnosis not present

## 2022-10-11 DIAGNOSIS — Y792 Prosthetic and other implants, materials and accessory orthopedic devices associated with adverse incidents: Secondary | ICD-10-CM | POA: Diagnosis present

## 2022-10-11 DIAGNOSIS — K920 Hematemesis: Secondary | ICD-10-CM | POA: Diagnosis not present

## 2022-10-11 DIAGNOSIS — K219 Gastro-esophageal reflux disease without esophagitis: Secondary | ICD-10-CM | POA: Diagnosis present

## 2022-10-11 DIAGNOSIS — J4489 Other specified chronic obstructive pulmonary disease: Secondary | ICD-10-CM | POA: Diagnosis present

## 2022-10-11 DIAGNOSIS — M6281 Muscle weakness (generalized): Secondary | ICD-10-CM | POA: Diagnosis not present

## 2022-10-11 DIAGNOSIS — D649 Anemia, unspecified: Secondary | ICD-10-CM | POA: Diagnosis not present

## 2022-10-11 DIAGNOSIS — F1011 Alcohol abuse, in remission: Secondary | ICD-10-CM | POA: Diagnosis present

## 2022-10-11 DIAGNOSIS — I10 Essential (primary) hypertension: Secondary | ICD-10-CM | POA: Diagnosis present

## 2022-10-11 DIAGNOSIS — Z23 Encounter for immunization: Secondary | ICD-10-CM | POA: Diagnosis not present

## 2022-10-11 DIAGNOSIS — K2211 Ulcer of esophagus with bleeding: Secondary | ICD-10-CM | POA: Diagnosis present

## 2022-10-11 DIAGNOSIS — Z7901 Long term (current) use of anticoagulants: Secondary | ICD-10-CM | POA: Diagnosis not present

## 2022-10-11 DIAGNOSIS — Z7401 Bed confinement status: Secondary | ICD-10-CM | POA: Diagnosis not present

## 2022-10-11 DIAGNOSIS — N401 Enlarged prostate with lower urinary tract symptoms: Secondary | ICD-10-CM | POA: Diagnosis not present

## 2022-10-11 DIAGNOSIS — Z452 Encounter for adjustment and management of vascular access device: Secondary | ICD-10-CM | POA: Diagnosis not present

## 2022-10-11 DIAGNOSIS — K922 Gastrointestinal hemorrhage, unspecified: Secondary | ICD-10-CM | POA: Diagnosis not present

## 2022-10-11 DIAGNOSIS — N400 Enlarged prostate without lower urinary tract symptoms: Secondary | ICD-10-CM | POA: Diagnosis present

## 2022-10-11 DIAGNOSIS — K5900 Constipation, unspecified: Secondary | ICD-10-CM | POA: Diagnosis not present

## 2022-10-11 DIAGNOSIS — Y831 Surgical operation with implant of artificial internal device as the cause of abnormal reaction of the patient, or of later complication, without mention of misadventure at the time of the procedure: Secondary | ICD-10-CM | POA: Diagnosis not present

## 2022-10-11 DIAGNOSIS — F0390 Unspecified dementia without behavioral disturbance: Secondary | ICD-10-CM | POA: Diagnosis present

## 2022-10-11 DIAGNOSIS — T8454XD Infection and inflammatory reaction due to internal left knee prosthesis, subsequent encounter: Secondary | ICD-10-CM | POA: Diagnosis not present

## 2022-10-11 DIAGNOSIS — E860 Dehydration: Secondary | ICD-10-CM | POA: Diagnosis present

## 2022-10-11 DIAGNOSIS — I700 Atherosclerosis of aorta: Secondary | ICD-10-CM | POA: Diagnosis present

## 2022-10-11 DIAGNOSIS — J449 Chronic obstructive pulmonary disease, unspecified: Secondary | ICD-10-CM | POA: Diagnosis not present

## 2022-10-11 DIAGNOSIS — Z21 Asymptomatic human immunodeficiency virus [HIV] infection status: Secondary | ICD-10-CM | POA: Diagnosis present

## 2022-10-11 DIAGNOSIS — I8500 Esophageal varices without bleeding: Secondary | ICD-10-CM | POA: Diagnosis not present

## 2022-10-11 DIAGNOSIS — R1110 Vomiting, unspecified: Secondary | ICD-10-CM | POA: Diagnosis not present

## 2022-10-11 DIAGNOSIS — R000 Tachycardia, unspecified: Secondary | ICD-10-CM | POA: Diagnosis not present

## 2022-10-11 DIAGNOSIS — E785 Hyperlipidemia, unspecified: Secondary | ICD-10-CM | POA: Diagnosis present

## 2022-10-11 DIAGNOSIS — A539 Syphilis, unspecified: Secondary | ICD-10-CM | POA: Diagnosis not present

## 2022-10-11 DIAGNOSIS — R1111 Vomiting without nausea: Secondary | ICD-10-CM | POA: Diagnosis not present

## 2022-10-11 DIAGNOSIS — Z79899 Other long term (current) drug therapy: Secondary | ICD-10-CM | POA: Diagnosis not present

## 2022-10-11 DIAGNOSIS — E441 Mild protein-calorie malnutrition: Secondary | ICD-10-CM | POA: Diagnosis not present

## 2022-10-11 DIAGNOSIS — R29898 Other symptoms and signs involving the musculoskeletal system: Secondary | ICD-10-CM | POA: Diagnosis not present

## 2022-10-11 DIAGNOSIS — Z87891 Personal history of nicotine dependence: Secondary | ICD-10-CM | POA: Diagnosis not present

## 2022-10-11 DIAGNOSIS — G4700 Insomnia, unspecified: Secondary | ICD-10-CM | POA: Diagnosis not present

## 2022-10-11 DIAGNOSIS — A6002 Herpesviral infection of other male genital organs: Secondary | ICD-10-CM | POA: Diagnosis not present

## 2022-10-11 DIAGNOSIS — R41841 Cognitive communication deficit: Secondary | ICD-10-CM | POA: Diagnosis not present

## 2022-10-11 DIAGNOSIS — Z7951 Long term (current) use of inhaled steroids: Secondary | ICD-10-CM | POA: Diagnosis not present

## 2022-10-11 DIAGNOSIS — Z96652 Presence of left artificial knee joint: Secondary | ICD-10-CM | POA: Diagnosis not present

## 2022-10-11 DIAGNOSIS — F17210 Nicotine dependence, cigarettes, uncomplicated: Secondary | ICD-10-CM | POA: Diagnosis present

## 2022-10-11 DIAGNOSIS — R10817 Generalized abdominal tenderness: Secondary | ICD-10-CM | POA: Diagnosis not present

## 2022-10-11 DIAGNOSIS — Z1331 Encounter for screening for depression: Secondary | ICD-10-CM | POA: Diagnosis not present

## 2022-10-11 DIAGNOSIS — R112 Nausea with vomiting, unspecified: Secondary | ICD-10-CM | POA: Diagnosis not present

## 2022-10-11 DIAGNOSIS — B182 Chronic viral hepatitis C: Secondary | ICD-10-CM | POA: Diagnosis not present

## 2022-10-11 DIAGNOSIS — R1010 Upper abdominal pain, unspecified: Secondary | ICD-10-CM | POA: Diagnosis not present

## 2022-10-11 DIAGNOSIS — R935 Abnormal findings on diagnostic imaging of other abdominal regions, including retroperitoneum: Secondary | ICD-10-CM | POA: Diagnosis not present

## 2022-10-11 DIAGNOSIS — L7682 Other postprocedural complications of skin and subcutaneous tissue: Secondary | ICD-10-CM | POA: Diagnosis not present

## 2022-10-11 MED ORDER — HEPARIN SOD (PORK) LOCK FLUSH 100 UNIT/ML IV SOLN
250.0000 [IU] | INTRAVENOUS | Status: AC | PRN
  Administered 2022-10-11: 250 [IU]

## 2022-10-11 NOTE — Progress Notes (Signed)
Called facility and gave report to [facility] E., all questions answered.    Pt is not in any distress. Due meds given, PICC capped by IV team. Called Pt's caregiver, Logan Levine. to pick up Pt's wheelchair as Logan Levine is unable to take them. Pt's wheelchair in the conference labeled with Pt's sticker and caregiver's name and phone number.  Pt discharged with belongings to SNF via [facility].

## 2022-10-11 NOTE — Progress Notes (Signed)
Subjective: 3 Days Post-Op Procedure(s) (LRB): Resection arthroplasty left knee; antibiotic spacer (Left)  Patient reports pain as mild to moderate.  Denies fever, chills, N/V, CP, SOB.  Tolerating POs well.  Admits to flatus.  Resting comfortably in bed.  Objective:   VITALS:  Temp:  [98.4 F (36.9 C)-99 F (37.2 C)] 98.8 F (37.1 C) (09/07 0905) Pulse Rate:  [96-99] 96 (09/07 0905) Resp:  [17-18] 17 (09/07 0905) BP: (129-146)/(73-99) 143/91 (09/07 0905) SpO2:  [94 %-100 %] 99 % (09/07 0905)  General: WDWN patient in NAD. Psych:  Appropriate mood and affect. Neuro:  A&O x 3, Moving all extremities, sensation intact to light touch HEENT:  EOMs intact Chest:  Even non-labored respirations Skin:  Aquacel C/D/I, no rashes or lesions Extremities: warm/dry, mild edema to L knee, no erythema or echymosis.  No lymphadenopathy. Pulses: Dorsalis pedis 2+ MSK:  ROM: lacks 5 degrees TKE, MMT: able to perform quad set, (-) Homan's    LABS Recent Labs    10/09/22 0347 10/10/22 0435  HGB 8.7* 8.3*  WBC 6.7 12.5*  PLT 216 249   Recent Labs    10/09/22 0347 10/10/22 1014  NA 131* 131*  K 5.5* 5.4*  CL 105 103  CO2 20* 21*  BUN 26* 32*  CREATININE 1.28* 1.29*  GLUCOSE 191* 128*   No results for input(s): "LABPT", "INR" in the last 72 hours.   Assessment/Plan: 3 Days Post-Op Procedure(s) (LRB): Resection arthroplasty left knee; antibiotic spacer (Left)  Patient seen in rounds for Dr. Lequita Halt PICC line in place.  OPAT orders for rocephin and vac have been signed. WBAT DVT ppx:  Eliquis D/C to SNF today. Plan for 2 week outpatient visit with Dr. Berdine Addison PA-C [facility] Office:  [phone]

## 2022-10-11 NOTE — Plan of Care (Signed)
  Problem: Education: Goal: Knowledge of the prescribed therapeutic regimen will improve Outcome: Progressing   Problem: Clinical Measurements: Goal: Postoperative complications will be avoided or minimized Outcome: Progressing   Problem: Skin Integrity: Goal: Will show signs of wound healing Outcome: Progressing   Problem: Education: Goal: Knowledge of General Education information will improve Description: Including pain rating scale, medication(s)/side effects and non-pharmacologic comfort measures Outcome: Progressing   Problem: Coping: Goal: Level of anxiety will decrease Outcome: Progressing   Problem: Safety: Goal: Ability to remain free from injury will improve Outcome: Progressing   Problem: Skin Integrity: Goal: Risk for impaired skin integrity will decrease Outcome: Progressing

## 2022-10-11 NOTE — Plan of Care (Signed)

## 2022-10-11 NOTE — TOC Transition Note (Addendum)
Transition of Care [facility]) - CM/SW Discharge Note   Patient Details  Name: Logan Levine MRN: [id number] Date of Birth: 1950-07-17  [facility] CM/SW Contact:  Georgie Chard, LCSW Phone Number: 10/11/2022, 9:49 AM   Clinical Narrative:    The patient at this time will be transported to [facility] in [place], This CSW has reached to [place] at the facility to make aware. CSW brought all appropriate documents to the nurse to provided to transport once they arrive. Bed # [id number] report # [id number]   Final next level of care: Skilled Nursing Facility Barriers to Discharge: No Barriers Identified   Patient Goals and CMS Choice CMS Medicare.gov Compare Post Acute Care list provided to:: Patient Choice offered to / list presented to : Patient  Discharge Placement                Patient chooses bed at: [facility] Patient to be transferred to facility by: [facility] Name of family member notified: Patient Patient and family notified of of transfer: 10/11/22  Discharge Plan and Services Additional resources added to the After Visit Summary for   In-house Referral: Clinical Social Work   Post Acute Care Choice: Skilled Nursing Facility          DME Arranged: N/A DME Agency: NA                  Social Determinants of Health (SDOH) Interventions SDOH Screenings   Food Insecurity: No Food Insecurity (10/08/2022)  Housing: Low Risk  (10/08/2022)  Transportation Needs: No Transportation Needs (10/08/2022)  Recent Concern: Transportation Needs - Unmet Transportation Needs (09/15/2022)   Received from [facility]  Utilities: Not At Risk (10/08/2022)  Financial Resource Strain: Low Risk  (06/09/2020)   Received from [facility] [place], [facility]  Tobacco Use: Medium Risk (10/08/2022)     Readmission Risk Interventions     No data to display

## 2022-10-13 ENCOUNTER — Other Ambulatory Visit (HOSPITAL_COMMUNITY): Payer: Self-pay

## 2022-10-13 ENCOUNTER — Telehealth: Payer: Self-pay | Admitting: Infectious Disease

## 2022-10-13 DIAGNOSIS — R5381 Other malaise: Secondary | ICD-10-CM | POA: Diagnosis not present

## 2022-10-13 DIAGNOSIS — M009 Pyogenic arthritis, unspecified: Secondary | ICD-10-CM | POA: Diagnosis not present

## 2022-10-13 NOTE — Telephone Encounter (Signed)
Informed Flo,RN at [facility] - she voiced her understanding.

## 2022-10-13 NOTE — Telephone Encounter (Signed)
Patient with [facility]  Needs to be narrowed to vancomycin (Ceftriaxone needs to be DC'd)  I made an appt for him with Dr. Drue Second but neither her nor SNF know about it since was DC over weekeend  Can we communicated with SNF to drop rocephin, make sure they are getting labs twice a week including vancomycin levels and faxing to our clinic and make surethey know when his appt is

## 2022-10-14 DIAGNOSIS — L7682 Other postprocedural complications of skin and subcutaneous tissue: Secondary | ICD-10-CM | POA: Diagnosis not present

## 2022-10-14 DIAGNOSIS — R112 Nausea with vomiting, unspecified: Secondary | ICD-10-CM | POA: Diagnosis not present

## 2022-10-14 DIAGNOSIS — D649 Anemia, unspecified: Secondary | ICD-10-CM | POA: Diagnosis not present

## 2022-10-14 DIAGNOSIS — Z79899 Other long term (current) drug therapy: Secondary | ICD-10-CM | POA: Diagnosis not present

## 2022-10-15 ENCOUNTER — Other Ambulatory Visit (HOSPITAL_COMMUNITY): Payer: Medicare Other

## 2022-10-15 LAB — SUSCEPTIBILITY, AER + ANAEROB: Source of Sample: 8680

## 2022-10-16 ENCOUNTER — Emergency Department (HOSPITAL_COMMUNITY): Payer: Medicare Other

## 2022-10-16 ENCOUNTER — Inpatient Hospital Stay (HOSPITAL_COMMUNITY)
Admission: EM | Admit: 2022-10-16 | Discharge: 2022-10-22 | DRG: 369 | Disposition: A | Discharge status: Skilled Nursing Facility | Payer: Medicare Other | Source: Skilled Nursing Facility | Attending: Internal Medicine | Admitting: Internal Medicine

## 2022-10-16 ENCOUNTER — Encounter (HOSPITAL_COMMUNITY)
Admission: EM | Disposition: A | Discharge status: Skilled Nursing Facility | Payer: Self-pay | Source: Skilled Nursing Facility | Attending: Family Medicine

## 2022-10-16 ENCOUNTER — Other Ambulatory Visit: Payer: Self-pay

## 2022-10-16 ENCOUNTER — Encounter (HOSPITAL_COMMUNITY): Payer: Self-pay | Admitting: Emergency Medicine

## 2022-10-16 ENCOUNTER — Observation Stay (HOSPITAL_COMMUNITY): Payer: Medicare Other | Admitting: Anesthesiology

## 2022-10-16 DIAGNOSIS — R066 Hiccough: Secondary | ICD-10-CM | POA: Diagnosis present

## 2022-10-16 DIAGNOSIS — D8481 Immunodeficiency due to conditions classified elsewhere: Secondary | ICD-10-CM | POA: Diagnosis present

## 2022-10-16 DIAGNOSIS — Y792 Prosthetic and other implants, materials and accessory orthopedic devices associated with adverse incidents: Secondary | ICD-10-CM | POA: Diagnosis present

## 2022-10-16 DIAGNOSIS — K2081 Other esophagitis with bleeding: Secondary | ICD-10-CM | POA: Diagnosis not present

## 2022-10-16 DIAGNOSIS — E441 Mild protein-calorie malnutrition: Secondary | ICD-10-CM | POA: Diagnosis present

## 2022-10-16 DIAGNOSIS — T8454XD Infection and inflammatory reaction due to internal left knee prosthesis, subsequent encounter: Secondary | ICD-10-CM

## 2022-10-16 DIAGNOSIS — K209 Esophagitis, unspecified without bleeding: Secondary | ICD-10-CM | POA: Diagnosis not present

## 2022-10-16 DIAGNOSIS — J4489 Other specified chronic obstructive pulmonary disease: Secondary | ICD-10-CM | POA: Diagnosis present

## 2022-10-16 DIAGNOSIS — R112 Nausea with vomiting, unspecified: Secondary | ICD-10-CM | POA: Diagnosis present

## 2022-10-16 DIAGNOSIS — R000 Tachycardia, unspecified: Secondary | ICD-10-CM | POA: Diagnosis not present

## 2022-10-16 DIAGNOSIS — Z79899 Other long term (current) drug therapy: Secondary | ICD-10-CM

## 2022-10-16 DIAGNOSIS — Z7901 Long term (current) use of anticoagulants: Secondary | ICD-10-CM | POA: Diagnosis not present

## 2022-10-16 DIAGNOSIS — I129 Hypertensive chronic kidney disease with stage 1 through stage 4 chronic kidney disease, or unspecified chronic kidney disease: Secondary | ICD-10-CM | POA: Diagnosis not present

## 2022-10-16 DIAGNOSIS — I10 Essential (primary) hypertension: Secondary | ICD-10-CM | POA: Diagnosis present

## 2022-10-16 DIAGNOSIS — M6281 Muscle weakness (generalized): Secondary | ICD-10-CM | POA: Diagnosis present

## 2022-10-16 DIAGNOSIS — F17210 Nicotine dependence, cigarettes, uncomplicated: Secondary | ICD-10-CM | POA: Diagnosis present

## 2022-10-16 DIAGNOSIS — Z96659 Presence of unspecified artificial knee joint: Secondary | ICD-10-CM | POA: Diagnosis present

## 2022-10-16 DIAGNOSIS — E785 Hyperlipidemia, unspecified: Secondary | ICD-10-CM | POA: Diagnosis present

## 2022-10-16 DIAGNOSIS — K5730 Diverticulosis of large intestine without perforation or abscess without bleeding: Secondary | ICD-10-CM | POA: Diagnosis not present

## 2022-10-16 DIAGNOSIS — K2211 Ulcer of esophagus with bleeding: Secondary | ICD-10-CM | POA: Diagnosis present

## 2022-10-16 DIAGNOSIS — B182 Chronic viral hepatitis C: Secondary | ICD-10-CM | POA: Diagnosis present

## 2022-10-16 DIAGNOSIS — R1111 Vomiting without nausea: Secondary | ICD-10-CM | POA: Diagnosis not present

## 2022-10-16 DIAGNOSIS — A6002 Herpesviral infection of other male genital organs: Secondary | ICD-10-CM | POA: Diagnosis present

## 2022-10-16 DIAGNOSIS — Z7951 Long term (current) use of inhaled steroids: Secondary | ICD-10-CM

## 2022-10-16 DIAGNOSIS — Z452 Encounter for adjustment and management of vascular access device: Secondary | ICD-10-CM | POA: Diagnosis not present

## 2022-10-16 DIAGNOSIS — A539 Syphilis, unspecified: Secondary | ICD-10-CM | POA: Diagnosis present

## 2022-10-16 DIAGNOSIS — K6389 Other specified diseases of intestine: Secondary | ICD-10-CM | POA: Diagnosis not present

## 2022-10-16 DIAGNOSIS — T8450XA Infection and inflammatory reaction due to unspecified internal joint prosthesis, initial encounter: Secondary | ICD-10-CM | POA: Diagnosis present

## 2022-10-16 DIAGNOSIS — J449 Chronic obstructive pulmonary disease, unspecified: Secondary | ICD-10-CM | POA: Diagnosis present

## 2022-10-16 DIAGNOSIS — Z6828 Body mass index (BMI) 28.0-28.9, adult: Secondary | ICD-10-CM

## 2022-10-16 DIAGNOSIS — K219 Gastro-esophageal reflux disease without esophagitis: Secondary | ICD-10-CM | POA: Diagnosis present

## 2022-10-16 DIAGNOSIS — I8510 Secondary esophageal varices without bleeding: Secondary | ICD-10-CM | POA: Clinically undetermined

## 2022-10-16 DIAGNOSIS — N400 Enlarged prostate without lower urinary tract symptoms: Secondary | ICD-10-CM | POA: Diagnosis present

## 2022-10-16 DIAGNOSIS — I700 Atherosclerosis of aorta: Secondary | ICD-10-CM | POA: Diagnosis present

## 2022-10-16 DIAGNOSIS — R748 Abnormal levels of other serum enzymes: Secondary | ICD-10-CM | POA: Diagnosis present

## 2022-10-16 DIAGNOSIS — K56609 Unspecified intestinal obstruction, unspecified as to partial versus complete obstruction: Secondary | ICD-10-CM | POA: Diagnosis not present

## 2022-10-16 DIAGNOSIS — E860 Dehydration: Secondary | ICD-10-CM | POA: Diagnosis present

## 2022-10-16 DIAGNOSIS — I8500 Esophageal varices without bleeding: Secondary | ICD-10-CM | POA: Diagnosis present

## 2022-10-16 DIAGNOSIS — Z87891 Personal history of nicotine dependence: Secondary | ICD-10-CM | POA: Diagnosis not present

## 2022-10-16 DIAGNOSIS — F0390 Unspecified dementia without behavioral disturbance: Secondary | ICD-10-CM | POA: Diagnosis present

## 2022-10-16 DIAGNOSIS — R935 Abnormal findings on diagnostic imaging of other abdominal regions, including retroperitoneum: Secondary | ICD-10-CM | POA: Diagnosis not present

## 2022-10-16 DIAGNOSIS — G4700 Insomnia, unspecified: Secondary | ICD-10-CM | POA: Diagnosis present

## 2022-10-16 DIAGNOSIS — D649 Anemia, unspecified: Secondary | ICD-10-CM | POA: Diagnosis present

## 2022-10-16 DIAGNOSIS — R1010 Upper abdominal pain, unspecified: Secondary | ICD-10-CM | POA: Diagnosis not present

## 2022-10-16 DIAGNOSIS — K5900 Constipation, unspecified: Secondary | ICD-10-CM | POA: Diagnosis present

## 2022-10-16 DIAGNOSIS — F1011 Alcohol abuse, in remission: Secondary | ICD-10-CM | POA: Diagnosis present

## 2022-10-16 DIAGNOSIS — J99 Respiratory disorders in diseases classified elsewhere: Secondary | ICD-10-CM | POA: Diagnosis present

## 2022-10-16 DIAGNOSIS — Z4682 Encounter for fitting and adjustment of non-vascular catheter: Secondary | ICD-10-CM | POA: Diagnosis not present

## 2022-10-16 DIAGNOSIS — M1990 Unspecified osteoarthritis, unspecified site: Secondary | ICD-10-CM | POA: Diagnosis present

## 2022-10-16 DIAGNOSIS — Z7982 Long term (current) use of aspirin: Secondary | ICD-10-CM

## 2022-10-16 DIAGNOSIS — K7460 Unspecified cirrhosis of liver: Secondary | ICD-10-CM | POA: Diagnosis present

## 2022-10-16 DIAGNOSIS — K7469 Other cirrhosis of liver: Secondary | ICD-10-CM | POA: Diagnosis present

## 2022-10-16 DIAGNOSIS — K922 Gastrointestinal hemorrhage, unspecified: Principal | ICD-10-CM | POA: Diagnosis present

## 2022-10-16 DIAGNOSIS — N401 Enlarged prostate with lower urinary tract symptoms: Secondary | ICD-10-CM | POA: Diagnosis present

## 2022-10-16 DIAGNOSIS — Z96652 Presence of left artificial knee joint: Secondary | ICD-10-CM | POA: Diagnosis not present

## 2022-10-16 DIAGNOSIS — K920 Hematemesis: Secondary | ICD-10-CM | POA: Diagnosis not present

## 2022-10-16 DIAGNOSIS — R41841 Cognitive communication deficit: Secondary | ICD-10-CM | POA: Diagnosis present

## 2022-10-16 DIAGNOSIS — Z21 Asymptomatic human immunodeficiency virus [HIV] infection status: Secondary | ICD-10-CM | POA: Diagnosis present

## 2022-10-16 DIAGNOSIS — K4090 Unilateral inguinal hernia, without obstruction or gangrene, not specified as recurrent: Secondary | ICD-10-CM | POA: Diagnosis present

## 2022-10-16 DIAGNOSIS — B20 Human immunodeficiency virus [HIV] disease: Secondary | ICD-10-CM | POA: Diagnosis present

## 2022-10-16 DIAGNOSIS — R10817 Generalized abdominal tenderness: Secondary | ICD-10-CM | POA: Diagnosis not present

## 2022-10-16 DIAGNOSIS — M1732 Unilateral post-traumatic osteoarthritis, left knee: Secondary | ICD-10-CM | POA: Diagnosis present

## 2022-10-16 DIAGNOSIS — R1110 Vomiting, unspecified: Secondary | ICD-10-CM | POA: Diagnosis not present

## 2022-10-16 DIAGNOSIS — L7682 Other postprocedural complications of skin and subcutaneous tissue: Secondary | ICD-10-CM | POA: Diagnosis present

## 2022-10-16 HISTORY — PX: ESOPHAGEAL BANDING: SHX5518

## 2022-10-16 HISTORY — DX: Unspecified cirrhosis of liver: K74.60

## 2022-10-16 HISTORY — PX: ESOPHAGOGASTRODUODENOSCOPY (EGD) WITH PROPOFOL: SHX5813

## 2022-10-16 LAB — COMPREHENSIVE METABOLIC PANEL
ALT: 48 U/L — ABNORMAL HIGH (ref 0–44)
AST: 84 U/L — ABNORMAL HIGH (ref 15–41)
Albumin: 2.7 g/dL — ABNORMAL LOW (ref 3.5–5.0)
Alkaline Phosphatase: 92 U/L (ref 38–126)
Anion gap: 9 (ref 5–15)
BUN: 42 mg/dL — ABNORMAL HIGH (ref 8–23)
CO2: 22 mmol/L (ref 22–32)
Calcium: 8.5 mg/dL — ABNORMAL LOW (ref 8.9–10.3)
Chloride: 100 mmol/L (ref 98–111)
Creatinine, Ser: 1.25 mg/dL — ABNORMAL HIGH (ref 0.61–1.24)
GFR, Estimated: >60 mL/min (ref >60)
Glucose, Bld: 112 mg/dL — ABNORMAL HIGH (ref 70–99)
Potassium: 4.3 mmol/L (ref 3.5–5.1)
Sodium: 131 mmol/L — ABNORMAL LOW (ref 135–145)
Total Bilirubin: 1 mg/dL (ref 0.3–1.2)
Total Protein: 8.5 g/dL — ABNORMAL HIGH (ref 6.5–8.1)

## 2022-10-16 LAB — CBC
HCT: 34 % — ABNORMAL LOW (ref 39.0–52.0)
Hemoglobin: 10.5 g/dL — ABNORMAL LOW (ref 13.0–17.0)
MCH: 24.8 pg — ABNORMAL LOW (ref 26.0–34.0)
MCHC: 30.9 g/dL (ref 30.0–36.0)
MCV: 80.4 fL (ref 80.0–100.0)
Platelets: 284 10*3/uL (ref 150–400)
RBC: 4.23 MIL/uL (ref 4.22–5.81)
RDW: 19.7 % — ABNORMAL HIGH (ref 11.5–15.5)
WBC: 10.2 10*3/uL (ref 4.0–10.5)
nRBC: 0 % (ref 0.0–0.2)

## 2022-10-16 LAB — TYPE AND SCREEN
ABO/RH(D): O POS
Antibody Screen: NEGATIVE

## 2022-10-16 LAB — PHOSPHORUS: Phosphorus: 3.1 mg/dL (ref 2.5–4.6)

## 2022-10-16 LAB — MAGNESIUM: Magnesium: 2.2 mg/dL (ref 1.7–2.4)

## 2022-10-16 LAB — LACTIC ACID, PLASMA: Lactic Acid, Venous: 1.3 mmol/L (ref 0.5–1.9)

## 2022-10-16 LAB — HEMOGLOBIN AND HEMATOCRIT, BLOOD
HCT: 30.7 % — ABNORMAL LOW (ref 39.0–52.0)
Hemoglobin: 9.4 g/dL — ABNORMAL LOW (ref 13.0–17.0)

## 2022-10-16 LAB — LIPASE, BLOOD: Lipase: 75 U/L — ABNORMAL HIGH (ref 11–51)

## 2022-10-16 LAB — PROTIME-INR
INR: 1.2 (ref 0.8–1.2)
Prothrombin Time: 15.8 s — ABNORMAL HIGH (ref 11.4–15.2)

## 2022-10-16 SURGERY — ESOPHAGOGASTRODUODENOSCOPY (EGD) WITH PROPOFOL
Anesthesia: General

## 2022-10-16 MED ORDER — ONDANSETRON HCL 4 MG/2ML IJ SOLN
4.0000 mg | Freq: Once | INTRAMUSCULAR | Status: AC
  Administered 2022-10-16: 4 mg via INTRAVENOUS
  Filled 2022-10-16: qty 2

## 2022-10-16 MED ORDER — PANTOPRAZOLE SODIUM 40 MG IV SOLR
40.0000 mg | Freq: Two times a day (BID) | INTRAVENOUS | Status: DC
  Administered 2022-10-16 – 2022-10-22 (×12): 40 mg via INTRAVENOUS
  Filled 2022-10-16 (×13): qty 10

## 2022-10-16 MED ORDER — SODIUM CHLORIDE 0.9% FLUSH: 3.0000 mL | Freq: Two times a day (BID) | INTRAVENOUS | Status: DC

## 2022-10-16 MED ORDER — FLEET ENEMA RE ENEM: 1.0000 | ENEMA | Freq: Once | RECTAL | Status: DC | PRN

## 2022-10-16 MED ORDER — LIDOCAINE HCL 1 % IJ SOLN
INTRAMUSCULAR | Status: DC | PRN
  Administered 2022-10-16: 50 mg via INTRADERMAL

## 2022-10-16 MED ORDER — PANTOPRAZOLE SODIUM 40 MG IV SOLR
40.0000 mg | Freq: Once | INTRAVENOUS | Status: AC
  Administered 2022-10-16: 40 mg via INTRAVENOUS
  Filled 2022-10-16: qty 10

## 2022-10-16 MED ORDER — ONDANSETRON HCL 4 MG PO TABS: 4.0000 mg | ORAL_TABLET | Freq: Four times a day (QID) | ORAL | Status: DC | PRN

## 2022-10-16 MED ORDER — SODIUM CHLORIDE 0.9% FLUSH: 3.0000 mL | INTRAVENOUS | Status: DC | PRN

## 2022-10-16 MED ORDER — TRAZODONE HCL 50 MG PO TABS
25.0000 mg | ORAL_TABLET | Freq: Every evening | ORAL | Status: DC | PRN
  Administered 2022-10-17 – 2022-10-19 (×2): 25 mg via ORAL
  Filled 2022-10-16 (×3): qty 1

## 2022-10-16 MED ORDER — HYDROMORPHONE HCL 1 MG/ML IJ SOLN
0.5000 mg | INTRAMUSCULAR | Status: DC | PRN
  Administered 2022-10-17: 0.5 mg via INTRAVENOUS
  Administered 2022-10-17 – 2022-10-18 (×2): 1 mg via INTRAVENOUS
  Administered 2022-10-18 – 2022-10-19 (×2): 0.5 mg via INTRAVENOUS
  Administered 2022-10-19 – 2022-10-20 (×2): 1 mg via INTRAVENOUS
  Administered 2022-10-20: 0.5 mg via INTRAVENOUS
  Filled 2022-10-16 (×2): qty 0.5
  Filled 2022-10-16 (×3): qty 1
  Filled 2022-10-16: qty 0.5
  Filled 2022-10-16: qty 1
  Filled 2022-10-16 (×2): qty 0.5

## 2022-10-16 MED ORDER — OXYCODONE HCL 5 MG PO TABS
5.0000 mg | ORAL_TABLET | ORAL | Status: DC | PRN
  Administered 2022-10-18 – 2022-10-19 (×2): 5 mg via ORAL
  Filled 2022-10-16 (×2): qty 1

## 2022-10-16 MED ORDER — BISACODYL 5 MG PO TBEC: 5.0000 mg | DELAYED_RELEASE_TABLET | Freq: Every day | ORAL | Status: DC | PRN

## 2022-10-16 MED ORDER — SENNOSIDES-DOCUSATE SODIUM 8.6-50 MG PO TABS: 1.0000 | ORAL_TABLET | Freq: Every evening | ORAL | Status: DC | PRN

## 2022-10-16 MED ORDER — SODIUM CHLORIDE 0.9 % IV SOLN: INTRAVENOUS | Status: AC

## 2022-10-16 MED ORDER — ACETAMINOPHEN 650 MG RE SUPP: 650.0000 mg | Freq: Four times a day (QID) | RECTAL | Status: DC | PRN

## 2022-10-16 MED ORDER — SODIUM CHLORIDE 0.9 % IV BOLUS
500.0000 mL | Freq: Once | INTRAVENOUS | Status: AC
  Administered 2022-10-16: 500 mL via INTRAVENOUS

## 2022-10-16 MED ORDER — SODIUM CHLORIDE 0.9 % IV SOLN
1.0000 g | Freq: Once | INTRAVENOUS | Status: AC
  Administered 2022-10-16: 1 g via INTRAVENOUS
  Filled 2022-10-16: qty 10

## 2022-10-16 MED ORDER — PROPOFOL 10 MG/ML IV BOLUS
INTRAVENOUS | Status: DC | PRN
  Administered 2022-10-16: 120 mg via INTRAVENOUS

## 2022-10-16 MED ORDER — LACTATED RINGERS IV SOLN
INTRAVENOUS | Status: DC | PRN
Start: 2022-10-16 — End: 2022-10-16

## 2022-10-16 MED ORDER — SUCCINYLCHOLINE CHLORIDE 200 MG/10ML IV SOSY
PREFILLED_SYRINGE | INTRAVENOUS | Status: DC | PRN
  Administered 2022-10-16: 140 mg via INTRAVENOUS

## 2022-10-16 MED ORDER — SODIUM CHLORIDE 0.9 % IV SOLN: INTRAVENOUS | Status: DC

## 2022-10-16 MED ORDER — ONDANSETRON HCL 4 MG/2ML IJ SOLN: 4.0000 mg | Freq: Four times a day (QID) | INTRAMUSCULAR | Status: DC | PRN

## 2022-10-16 MED ORDER — HYDRALAZINE HCL 20 MG/ML IJ SOLN: 10.0000 mg | INTRAMUSCULAR | Status: DC | PRN

## 2022-10-16 MED ORDER — LEVALBUTEROL HCL 0.63 MG/3ML IN NEBU: 0.6300 mg | INHALATION_SOLUTION | Freq: Four times a day (QID) | RESPIRATORY_TRACT | Status: DC | PRN

## 2022-10-16 MED ORDER — HEPARIN SODIUM (PORCINE) 5000 UNIT/ML IJ SOLN: 5000.0000 [IU] | Freq: Three times a day (TID) | INTRAMUSCULAR | Status: DC

## 2022-10-16 MED ORDER — SODIUM CHLORIDE 0.9 % IV SOLN
50.0000 ug/h | INTRAVENOUS | Status: DC
  Administered 2022-10-16 – 2022-10-17 (×3): 50 ug/h via INTRAVENOUS
  Filled 2022-10-16 (×5): qty 1

## 2022-10-16 MED ORDER — IPRATROPIUM BROMIDE 0.02 % IN SOLN: 0.5000 mg | Freq: Four times a day (QID) | RESPIRATORY_TRACT | Status: DC | PRN

## 2022-10-16 MED ORDER — IOHEXOL 300 MG/ML  SOLN
100.0000 mL | Freq: Once | INTRAMUSCULAR | Status: AC | PRN
  Administered 2022-10-16: 100 mL via INTRAVENOUS

## 2022-10-16 MED ORDER — ACETAMINOPHEN 325 MG PO TABS: 650.0000 mg | ORAL_TABLET | Freq: Four times a day (QID) | ORAL | Status: DC | PRN

## 2022-10-16 MED ORDER — SODIUM CHLORIDE 0.9% FLUSH
3.0000 mL | Freq: Two times a day (BID) | INTRAVENOUS | Status: DC
  Administered 2022-10-16 – 2022-10-19 (×5): 3 mL via INTRAVENOUS

## 2022-10-16 MED ORDER — PHENYLEPHRINE 80 MCG/ML (10ML) SYRINGE FOR IV PUSH (FOR BLOOD PRESSURE SUPPORT)
PREFILLED_SYRINGE | INTRAVENOUS | Status: AC
  Filled 2022-10-16: qty 10

## 2022-10-16 MED ORDER — SODIUM CHLORIDE 0.9 % IV SOLN: 250.0000 mL | INTRAVENOUS | Status: DC | PRN
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
## 2022-10-16 NOTE — Assessment & Plan Note (Addendum)
Monitoring, stable status post EGD - GI bleed, likely due to Eliquis which has been started by orthopedic team in the recent discharge for DVT prophylaxis -Patient also has liver cirrhosis due to chronic hepatitis C, and esophageal varices identified on the CT scan -Monitoring H&H closely    Latest Ref Rng & Units 10/18/2022    4:42 AM 10/17/2022    5:03 AM 10/16/2022    3:44 PM  CBC  WBC 4.0 - 10.5 K/uL 9.4  8.4    Hemoglobin 13.0 - 17.0 g/dL 8.4  9.5  9.4   Hematocrit 39.0 - 52.0 % 27.5  32.0  30.7   Platelets 150 - 400 K/uL 243  286     -10/17/2022 s/p EGD findings:Erosive esophagitis with bleeding, grade 2 esophageal varices nonbleeding, retained blood in duodenum   -Monitoring H&H,  -IV Protonix  - IV Octreotide discontinued -Anticipating addition of carvedilol 3.12 mg twice daily  -GI and general surgery consulted appreciated further evaluation and recommendations

## 2022-10-16 NOTE — Assessment & Plan Note (Addendum)
-  Patient is pulling out his NG tube again this morning -Per patient and records BM x 2 nonbloody or melena  Abdomen remains distended, but not tender -Reporting of no nausea vomiting    POA:  Nausea vomiting x 5 today -initially was bloody now clear -If further vomiting will initiate NG tube placement -Finding of SBO per CT findings  -Will resume IVF -General Surgery following -Will monitor closely  -Try to have the patient drink the contrast, KUB in 8 hours

## 2022-10-16 NOTE — Progress Notes (Signed)
PICC line noted to right upper arm on admission assessment, patient reports "it was put in at [facility]". Per chart review, PICC placed 9/5. Dressing noted to be curled on edges with some old drainage, no cap present. Notified vascular access and confirmed PICC should be removed and provider can reassess tomorrow and consult with ID on if they would like new PICC line placed. Paged Dr. Flossie Dibble to notify.
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
## 2022-10-16 NOTE — Progress Notes (Signed)
Patient underwent EGD with Nasogastric tube placement  under propofol sedation.  Tolerated the procedure adequately.   FINDINGS:  - LA Grade C erosive esophagitis with bleeding.  - Grade II esophageal varices; non bleeding and no high risk stigmata ( red wale sign or white nipple)  - Excessive gastric fluid.  - Retained food in the duodenum.  - No specimens collected   - Likely cause of bleeding from Esophagitis which was noticed to be oozing slightly upon contact with scope  RECOMMENDATIONS  - PPI IV BID  - Ceftriaxone 1gm for 7 days  - Keep NPO -  Keep NG tube to intermittent low wall suction  - Management of SBO as per surgery  - Can start Non- Selective beta blockers for EV upon discharge  Vista Lawman, MD Gastroenterology and Hepatology [facility]

## 2022-10-16 NOTE — Hospital Course (Signed)
Logan Levine is a 72 year old male with extensive history of chronic appetitive C with liver cirrhosis, HIV, anemia, BPH, chronic bronchitis, COPD, GERD,h/o genital herpes, syphilis, HTN, HLD, recent (10/08/22)  left excisional total knee arthroplasty due to infection-on IV antibiotic and Eliquis.  Currently residing in rehab center on IV antibiotics of vancomycin and Eliquis for 19 days.  Patient will discharge 3 days ago on 10/10/2022 from [facility].    Patient presenting to ED from [facility] with chief complaint of vomiting x 5 today bloody emesis was noted by the staff. On arrival in ED patient's emesis was clear, normal  Hemoglobin currently 10.5 (improved from previous admission 8.7, 8.3, now 10.5)  Sodium 131, BUN 42 creatinine 1.25,  CT abdomen/pelvis IMPRESSION: 1. Findings are compatible with small bowel obstruction with transition point identified within the left hemiabdomen. 2. Cirrhosis.  No splenomegaly or ascites. 3. Circumferential wall thickening involving the visualized portions of the distal esophagus. Esophageal varices identified with irregular hyperenhancement along the wall of the esophagus. 4. High attenuation material is noted layering within the dependent portion of the gastric fundus. This is indeterminate and may be seen with contrast ingestion. In a patient presenting with bloody emesis (as reported in the clinical history) blood products within the lumen of the stomach cannot be excluded. 5. Sigmoid diverticulosis without signs of acute diverticulitis. 6. Fat containing inguinal hernias. 7.  Aortic Atherosclerosis    EDP has consulted and notified gastroenterologist and general surgery--requested patient to be admitted for further evaluation

## 2022-10-16 NOTE — Anesthesia Procedure Notes (Signed)
Procedure Name: Intubation Date/Time: 10/16/2022 4:29 PM  Performed by: Moshe Salisbury, CRNAPre-anesthesia Checklist: Patient identified, Patient being monitored, Timeout performed, Emergency Drugs available and Suction available Patient Re-evaluated:Patient Re-evaluated prior to induction Oxygen Delivery Method: Circle system utilized Preoxygenation: Pre-oxygenation with 100% oxygen Induction Type: IV induction Ventilation: Mask ventilation without difficulty Laryngoscope Size: Mac and 3 Grade View: Grade I Tube type: Oral Tube size: 7.0 mm Number of attempts: 1 Airway Equipment and Method: Stylet Placement Confirmation: ETT inserted through vocal cords under direct vision, positive ETCO2 and breath sounds checked- equal and bilateral Secured at: 22 cm Tube secured with: Tape Dental Injury: Teeth and Oropharynx as per pre-operative assessment

## 2022-10-16 NOTE — Progress Notes (Signed)
Knee immobilizer present on admission assessment, patient reports recent knee surgery. Immobilizer removed for skin assessment, noted to have silicone dressing wrapped with kerlex, silicone dressing saturated with serosanguinous drainage. Dressing removed and area cleansed by Vincent Peyer, RN. Mepolex border dressing applied after cleansing with saline. Patient reports "I haven't been up at the nursing home, they left the immobilizer on all the time." Shearing noted to left, outer posterior knee/leg area under immobilizer. Foam dressing applied.

## 2022-10-16 NOTE — Assessment & Plan Note (Addendum)
-  No behavioral disturbance, currently stable -Not on any medications

## 2022-10-16 NOTE — Op Note (Signed)
[facility] Patient Name: Logan Levine Procedure Date: 10/16/2022 3:49 PM MRN: [id number] Date of Birth: 1950-02-26 Attending MD: Sanjuan Dame , MD, [id number] CSN: [id number] Age: 72 Admit Type: Outpatient Procedure:                Upper GI endoscopy Indications:              Hematemesis Providers:                Sanjuan Dame, MD, Jannett Celestine, RN, Elinor Parkinson, Dyann Ruddle Referring MD:              Medicines:                Monitored Anesthesia Care Complications:            No immediate complications. Estimated Blood Loss:     Estimated blood loss: none. Procedure:                Pre-Anesthesia Assessment:                           - Prior to the procedure, a History and Physical                            was performed, and patient medications and                            allergies were reviewed. The patient's tolerance of                            previous anesthesia was also reviewed. The risks                            and benefits of the procedure and the sedation                            options and risks were discussed with the patient.                            All questions were answered, and informed consent                            was obtained. Prior Anticoagulants: The patient has                            taken Eliquis (apixaban), last dose was 1 day prior                            to procedure. ASA Grade Assessment: IV - A patient                            with severe systemic disease that is a constant  threat to life. After reviewing the risks and                            benefits, the patient was deemed in satisfactory                            condition to undergo the procedure.                           After obtaining informed consent, the endoscope was                            passed under direct vision. Throughout the                            procedure, the patient's  blood pressure, pulse, and                            oxygen saturations were monitored continuously. The                            [id number]-[id number] ([id number]) scope was introduced through the                            mouth, and advanced to the second part of duodenum.                            The upper GI endoscopy was accomplished without                            difficulty. The patient tolerated the procedure                            well. Scope In: 4:31:49 PM Scope Out: 4:40:20 PM Total Procedure Duration: 0 hours 8 minutes 31 seconds  Findings:      LA Grade C (one or more mucosal breaks continuous between tops of 2 or       more mucosal folds, less than 75% circumference) esophagitis with       bleeding was found in the lower third of the esophagus.      Grade II varices were found in the lower third of the esophagus.      Excessive fluid was found in the entire examined stomach. Nasogastric       decompression tube was placed.      There is no endoscopic evidence of bleeding in the entire examined       stomach.      Food (residue) was found in the duodenal bulb and in the second portion       of the duodenum.      An 18 Fr nasogastric tube was placed through the nares into the       esophagus. Under endoscopic guidance, the tube was advanced into the       stomach. Placement was confirmed by scope visualization. Impression:               - LA Grade C erosive  esophagitis with bleeding.                           - Grade II esophageal varices; non bleeding and no                            high risk stigmata ( red wale sign or white nipple)                           - Excessive gastric fluid.                           - Retained food in the duodenum.                           - No specimens collected.                           -Likely cause of bleeding likely from Esophagitis                            which was noticed to be oozing slightly upon                             contact with scope Moderate Sedation:      Per Anesthesia Care Recommendation:           -PPI IV BID                           - Ceftriaxone 1gm for 7 days                           -Keep NPO                           -Keep NG tube to intermittent low wall suction                           - Management of SBO as per surgery                           - Can start Non-Selective beta blockers for EV upon                            discharge Procedure Code(s):        --- Professional ---                           [phone], Esophagogastroduodenoscopy, flexible,                            transoral; with insertion of intraluminal tube or                            catheter Diagnosis Code(s):        --- Professional ---  K20.81, Other esophagitis with bleeding                           I85.00, Esophageal varices without bleeding                           K92.0, Hematemesis CPT copyright 2022 [employer]. All rights reserved. The codes documented in this report are preliminary and upon coder review may  be revised to meet current compliance requirements. Sanjuan Dame, MD Sanjuan Dame, MD 10/16/2022 4:58:58 PM This report has been signed electronically. Number of Addenda: 0

## 2022-10-16 NOTE — Transfer of Care (Signed)
Immediate Anesthesia Transfer of Care Note  Patient: Logan Levine  Procedure(s) Performed: ESOPHAGOGASTRODUODENOSCOPY (EGD) WITH PROPOFOL ESOPHAGEAL BANDING  Patient Location: PACU  Anesthesia Type:General  Level of Consciousness: awake  Airway & Oxygen Therapy: Patient Spontanous Breathing  Post-op Assessment: Report given to RN  Post vital signs: Reviewed and stable  Last Vitals:  Vitals Value Taken Time  BP 96/63 10/16/22 1700  Temp 36.7 C 10/16/22 1655  Pulse 96 10/16/22 1703  Resp 22 10/16/22 1703  SpO2 97 % 10/16/22 1703  Vitals shown include unfiled device data.  Last Pain:  Vitals:   10/16/22 1635  TempSrc:   PainSc: 0-No pain         Complications: No notable events documented.

## 2022-10-16 NOTE — Interval H&P Note (Signed)
History and Physical Interval Note:  10/16/2022 4:26 PM  Logan Levine  has presented today for surgery, with the diagnosis of hematemesis, cirrhosis with known esophageal varices.  The various methods of treatment have been discussed with the patient and family. After consideration of risks, benefits and other options for treatment, the patient has consented to  Procedure(s): ESOPHAGOGASTRODUODENOSCOPY (EGD) WITH PROPOFOL (N/A) ESOPHAGEAL BANDING (N/A) as a surgical intervention.  The patient's history has been reviewed, patient examined, no change in status, stable for surgery.  I have reviewed the patient's chart and labs.  Questions were answered to the patient's satisfaction.      We will proceed with EGD with variceal banding  as scheduled.    I thoroughly discussed with the patient the procedure, including the risks involved. Patient understands what the procedure involves including the benefits and any risks. Patient understands alternatives to the proposed procedure. Risks including (but not limited to) bleeding, tearing of the lining (perforation), rupture of adjacent organs, problems with heart and lung function, infection, and medication reactions. A small percentage of complications may require surgery, hospitalization, repeat endoscopic procedure, and/or transfusion.  Patient understood and agreed that his risk of complications are higher given his condition  Logan Levine

## 2022-10-16 NOTE — ED Triage Notes (Signed)
Pt came from [facility] for infection in knee. C/o vomiting x 5 days, blood in emesis today. Arrived a/o. Color wnl. Cbg 120. Lnbm x 4 days ago. Abd distended and tight. Tender to palpation.

## 2022-10-16 NOTE — Assessment & Plan Note (Addendum)
Stable, resume losartan -once tolerating p.o. -Utilizing as needed hydralazine IV

## 2022-10-16 NOTE — Assessment & Plan Note (Addendum)
-   Left knee remains edematous, nontender, with no erythema Afebrile, normotensive Wound clean, staples in place-dressing in place  - Followed closely by ID team as an outpatient -Was recently was on Rocephin and vancomycin, antibiotics has been narrowed down to IV vancomycin only  -Patient has a PICC line right arm -was discontinued 10/17/2022 New left arm PICC line was placed 10/17/2022  -Discussed with ID, Dr. Daiva Eves,  recommending continue IV vancomycin Edema likely due to hematoma No signs of reinfection or SIRS/sepsis

## 2022-10-16 NOTE — ED Provider Notes (Signed)
[place] EMERGENCY DEPARTMENT AT [facility] Provider Note   CSN: [id number] Arrival date & time: 10/16/22  0944     History  Chief Complaint  Patient presents with   Emesis    Logan Levine is a 72 y.o. male.  Patient brought in from his rehab center for evaluation of vomiting.  It sounds like it started 5 days ago, today vomitus reportedly had some blood in it.  He denies any diarrhea.  He does endorse abdominal pain midepigastric.  He does not think he has had a fever no chest pain or shortness of breath.  He is having hiccups . he recently had surgery on his left knee for knee infection and is at rehab getting antibiotics.  He has a history of hypertension HIV hepatitis.  Past surgical history includes hernia repair.   The history is provided by the patient.  Emesis Severity:  Moderate Duration:  5 days Timing:  Intermittent Chronicity:  New Associated symptoms: abdominal pain   Associated symptoms: no cough, no diarrhea and no fever        Home Medications Prior to Admission medications   Medication Sig Start Date End Date Taking? Authorizing Provider  acetaminophen (TYLENOL) 650 MG CR tablet Take 1,300 mg by mouth in the morning and at bedtime.    [provider]  albuterol (PROVENTIL HFA;VENTOLIN HFA) 108 (90 Base) MCG/ACT inhaler Inhale 2 puffs into the lungs every 6 (six) hours as needed for wheezing or shortness of breath.    [provider]  apixaban (ELIQUIS) 2.5 MG TABS tablet Take 1 tablet (2.5 mg total) by mouth every 12 (twelve) hours for 19 days. Then resume one 81 mg aspirin once a day 10/10/22 10/29/22  Edmisten, Kristie L, PA  cefTRIAXone (ROCEPHIN) IVPB Inject 2 g into the vein daily. Indication:  Prosthetic joint infection First Dose: Yes Last Day of Therapy:  11/19/2022 Labs - Once weekly:  CBC/D and BMP, Labs - Once weekly: ESR and CRP Method of administration: IV Push Method of administration may be changed at the  discretion of home infusion pharmacist based upon assessment of the patient and/or caregiver's ability to self-administer the medication ordered. 10/10/22 11/19/22  Judyann Munson, MD  emtricitabine-tenofovir AF (DESCOVY) 200-25 MG tablet Take 1 tablet by mouth daily.    [provider]  losartan (COZAAR) 100 MG tablet Take 100 mg by mouth daily. 06/10/22   [provider]  methocarbamol (ROBAXIN) 500 MG tablet Take 1 tablet (500 mg total) by mouth every 6 (six) hours as needed for muscle spasms. 10/10/22   Edmisten, Kristie L, PA  omeprazole (PRILOSEC) 10 MG capsule Take 10 mg by mouth daily.    [provider]  ondansetron (ZOFRAN) 4 MG tablet Take 1 tablet (4 mg total) by mouth every 6 (six) hours as needed for nausea. 10/10/22   Edmisten, Kristie L, PA  oxyCODONE (OXY IR/ROXICODONE) 5 MG immediate release tablet Take 1-2 tablets (5-10 mg total) by mouth every 6 (six) hours as needed for severe pain. 10/10/22   Edmisten, Kristie L, PA  polyethylene glycol powder (GLYCOLAX/MIRALAX) 17 GM/SCOOP powder Measure 17 g of powder, mix in 4 oz of water or juice & drink by mouth daily as needed for mild constipation. 10/10/22   Edmisten, Lyn Hollingshead, PA  QVAR 80 MCG/ACT inhaler INHALE 2 PUFFS INTO MOUTH TWICE DAILY 05/01/11   Daiva Eves, Lisette Grinder, MD  raltegravir (ISENTRESS) 400 MG tablet Take 400 mg by mouth 2 (two)  times daily.    [provider]  sulfamethoxazole-trimethoprim (BACTRIM,SEPTRA) 400-80 MG tablet Take 1 tablet by mouth daily. 04/01/17   [provider]  tamsulosin (FLOMAX) 0.4 MG CAPS capsule Take 0.8 mg by mouth daily.    [provider]  traMADol (ULTRAM) 50 MG tablet Take 1-2 tablets (50-100 mg total) by mouth every 6 (six) hours as needed for moderate pain. 10/10/22   Edmisten, Lyn Hollingshead, PA  valACYclovir (VALTREX) 500 MG tablet Take 500 mg by mouth daily.    [provider]  vancomycin IVPB Inject 1,250 mg into the vein daily. Indication:   prosthetic joint infection First Dose: Yes Last Day of Therapy:  11/19/2022 Labs - Sunday/Monday:  CBC/D, BMP, and vancomycin trough. Labs - Thursday:  BMP and vancomycin trough Labs - Once weekly: ESR and CRP Method of administration:Elastomeric Method of administration may be changed at the discretion of the patient and/or caregiver's ability to self-administer the medication ordered. 10/10/22 11/19/22  Judyann Munson, MD      Allergies    Patient has no known allergies.    Review of Systems   Review of Systems  Constitutional:  Negative for fever.  Respiratory:  Negative for cough.   Cardiovascular:  Negative for chest pain.  Gastrointestinal:  Positive for abdominal pain and vomiting. Negative for diarrhea.    Physical Exam Updated Vital Signs BP 101/74 (BP Location: Left Arm)   Pulse 95   Temp 98.2 F (36.8 C) (Oral)   Resp 18   SpO2 96%  Physical Exam Vitals and nursing note reviewed.  Constitutional:      General: He is not in acute distress.    Appearance: Normal appearance. He is well-developed.  HENT:     Head: Normocephalic and atraumatic.  Eyes:     Conjunctiva/sclera: Conjunctivae normal.  Cardiovascular:     Rate and Rhythm: Normal rate and regular rhythm.     Heart sounds: No murmur heard. Pulmonary:     Effort: Pulmonary effort is normal. No respiratory distress.     Breath sounds: Normal breath sounds.  Abdominal:     General: There is distension.     Palpations: Abdomen is soft.     Tenderness: There is abdominal tenderness (diffuse). There is no guarding or rebound.  Musculoskeletal:     Cervical back: Neck supple.     Comments: Left lower extremity in knee immobilizer.  Skin:    General: Skin is warm and dry.     Capillary Refill: Capillary refill takes less than 2 seconds.  Neurological:     General: No focal deficit present.     Mental Status: He is alert.     ED Results / Procedures / Treatments   Labs (all labs ordered are listed,  but only abnormal results are displayed) Labs Reviewed  COMPREHENSIVE METABOLIC PANEL  CBC  LIPASE, BLOOD  MAGNESIUM  LACTIC ACID, PLASMA  LACTIC ACID, PLASMA  PROTIME-INR  TYPE AND SCREEN    EKG None  Radiology No results found.  Procedures .Critical Care  Performed by: Terrilee Files, MD Authorized by: Terrilee Files, MD   Critical care provider statement:    Critical care time (minutes):  45   Critical care time was exclusive of:  Separately billable procedures and treating other patients   Critical care was necessary to treat or prevent imminent or life-threatening deterioration of the following conditions: gi bleed, abdominal pain.   Critical care was time spent personally by me on the  following activities:  Development of treatment plan with patient or surrogate, discussions with consultants, evaluation of patient's response to treatment, examination of patient, obtaining history from patient or surrogate, ordering and performing treatments and interventions, ordering and review of laboratory studies, ordering and review of radiographic studies, pulse oximetry, re-evaluation of patient's condition and review of old charts   I assumed direction of critical care for this patient from another provider in my specialty: no       Medications Ordered in ED Medications  sodium chloride flush (NS) 0.9 % injection 3 mL ( Intravenous Automatically Held 10/24/22 2200)  0.9 %  sodium chloride infusion ( Intravenous New Bag/Given 10/16/22 1443)  acetaminophen (TYLENOL) tablet 650 mg ( Oral MAR Hold 10/16/22 1554)    Or  acetaminophen (TYLENOL) suppository 650 mg ( Rectal MAR Hold 10/16/22 1554)  oxyCODONE (Oxy IR/ROXICODONE) immediate release tablet 5 mg ( Oral MAR Hold 10/16/22 1554)  HYDROmorphone (DILAUDID) injection 0.5-1 mg ( Intravenous MAR Hold 10/16/22 1554)  traZODone (DESYREL) tablet 25 mg ( Oral MAR Hold 10/16/22 1554)  senna-docusate (Senokot-S) tablet 1 tablet ( Oral MAR  Hold 10/16/22 1554)  bisacodyl (DULCOLAX) EC tablet 5 mg ( Oral MAR Hold 10/16/22 1554)  sodium phosphate (FLEET) enema 1 enema ( Rectal MAR Hold 10/16/22 1554)  ondansetron (ZOFRAN) tablet 4 mg ( Oral MAR Hold 10/16/22 1554)    Or  ondansetron (ZOFRAN) injection 4 mg ( Intravenous MAR Hold 10/16/22 1554)  ipratropium (ATROVENT) nebulizer solution 0.5 mg ( Nebulization MAR Hold 10/16/22 1554)  levalbuterol (XOPENEX) nebulizer solution 0.63 mg ( Nebulization MAR Hold 10/16/22 1554)  hydrALAZINE (APRESOLINE) injection 10 mg ( Intravenous MAR Hold 10/16/22 1554)  octreotide (SANDOSTATIN) 500 mcg in sodium chloride 0.9 % 250 mL (2 mcg/mL) infusion (has no administration in time range)  pantoprazole (PROTONIX) injection 40 mg ( Intravenous Automatically Held 10/24/22 2200)  0.9 %  sodium chloride infusion (has no administration in time range)  pantoprazole (PROTONIX) injection 40 mg (40 mg Intravenous Given 10/16/22 1105)  ondansetron (ZOFRAN) injection 4 mg (4 mg Intravenous Given 10/16/22 1104)  sodium chloride 0.9 % bolus 500 mL (0 mLs Intravenous Stopped 10/16/22 1153)  iohexol (OMNIPAQUE) 300 MG/ML solution 100 mL (100 mLs Intravenous Contrast Given 10/16/22 1210)  cefTRIAXone (ROCEPHIN) 1 g in sodium chloride 0.9 % 100 mL IVPB (1 g Intravenous New Bag/Given 10/16/22 1446)    ED Course/ Medical Decision Making/ A&P Clinical Course as of 10/16/22 1650  Thu Oct 16, 2022  1057 Chest x-ray showing a massively enlarged gastric bubble.  Awaiting radiology reading. [MB]  1345 discussed with Dr. Henreitta Leber is from general surgery.  She said to hold off on the NG tube but keep him n.p.o.  She asked to get GI in the loop.  She will see in consult. [MB]    Clinical Course User Index [MB] Terrilee Files, MD                                 Medical Decision Making Amount and/or Complexity of Data Reviewed Labs: ordered. Radiology: ordered.  Risk Prescription drug management. Decision regarding  hospitalization.   This patient complains of abdominal pain and vomiting; this involves an extensive number of treatment Options and is a complaint that carries with it a high risk of complications and morbidity. The differential includes peptic ulcer disease, esophageal varices, perforation, cirrhosis, obstruction, diverticulitis, colitis  I ordered, reviewed and interpreted  labs, which included CBC with normal white count, hemoglobin low better than priors, chemistries with mildly low sodium, chronic renal insufficiency, mildly elevated LFTs with history of cirrhosis, INR slightly elevated I ordered medication IV fluids IV PPI IV nausea medication IV antibiotics IV octreotide and reviewed PMP when indicated. I ordered imaging studies which included chest x-ray and CT abdomen and pelvis and I independently    visualized and interpreted imaging which showed esophageal thickening possible signs of bleeding, bowel obstruction, signs of cirrhosis Previous records obtained and reviewed in epic including recent discharge summary I consulted Dr. Flossie Dibble [facility] hospital[facility], Dr. Henreitta Leber general surgery, Dr. Tasia Catchings gastroenterology and discussed lab and imaging findings and discussed disposition.  Cardiac monitoring reviewed, sinus rhythm Social determinants considered, no significant barriers Critical Interventions: Initiation of fluids octreotide PPI and consultation of surgery and GI  After the interventions stated above, I reevaluated the patient and found patient to still be alert and hemodynamically well-appearing Admission and further testing considered, patient would benefit from mission to the hospital and further management with consultants.  It sounds like he may be going for an EGD right now to see if there is any signs of active bleeding.         Final Clinical Impression(s) / ED Diagnoses Final diagnoses:  Upper GI bleed  Small bowel obstruction [facility])    Rx / DC Orders ED  Discharge Orders     None         Terrilee Files, MD 10/16/22 1655

## 2022-10-16 NOTE — Anesthesia Preprocedure Evaluation (Signed)
Anesthesia Evaluation  Patient identified by MRN, date of birth, ID band Patient awake    Reviewed: Allergy & Precautions, H&P , NPO status , Patient's Chart, lab work & pertinent test results, reviewed documented beta blocker date and time   Airway Mallampati: II  TM Distance: >3 FB Neck ROM: full    Dental no notable dental hx. (+) Edentulous Upper, Edentulous Lower   Pulmonary neg pulmonary ROS, COPD, former smoker   Pulmonary exam normal breath sounds clear to auscultation       Cardiovascular Exercise Tolerance: Good hypertension, negative cardio ROS  Rhythm:regular Rate:Normal     Neuro/Psych  PSYCHIATRIC DISORDERS     Dementia negative neurological ROS  negative psych ROS   GI/Hepatic negative GI ROS,GERD  ,,(+) Cirrhosis   Esophageal Varices and ascites    , Hepatitis -, C  Endo/Other    Morbid obesity  Renal/GU CRFRenal diseasenegative Renal ROS  negative genitourinary   Musculoskeletal   Abdominal   Peds  Hematology  (+) Blood dyscrasia, anemia , HIV  Anesthesia Other Findings   Reproductive/Obstetrics negative OB ROS                             Anesthesia Physical Anesthesia Plan  ASA: 4 and emergent  Anesthesia Plan: General and General ETT   Post-op Pain Management:    Induction:   PONV Risk Score and Plan: Ondansetron  Airway Management Planned:   Additional Equipment:   Intra-op Plan:   Post-operative Plan:   Informed Consent: I have reviewed the patients History and Physical, chart, labs and discussed the procedure including the risks, benefits and alternatives for the proposed anesthesia with the patient or authorized representative who has indicated his/her understanding and acceptance.     Dental Advisory Given  Plan Discussed with: CRNA  Anesthesia Plan Comments:        Anesthesia Quick Evaluation

## 2022-10-16 NOTE — H&P (View-Only) (Signed)
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 banding today.  I have discussed the risks, alternatives, benefits with regards to but not limited to the risk of reaction to medication, bleeding, infection, perforation and the patient is agreeable to proceed. Written consent to be obtained.    LOS: 0 days   We would like to thank you for the opportunity to participate in the care of PERRIS AMJAD.  Leanna Battles. Dixon Boos [facility] [phone] 9/12/20242:02 PM

## 2022-10-16 NOTE — Consult Note (Signed)
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 banding today.  I have discussed the risks, alternatives, benefits with regards to but not limited to the risk of reaction to medication, bleeding, infection, perforation and the patient is agreeable to proceed. Written consent to be obtained.    LOS: 0 days   We would like to thank you for the opportunity to participate in the care of Logan Levine.  Leanna Battles. [facility] [phone] 9/12/20242:02 PM

## 2022-10-16 NOTE — Assessment & Plan Note (Signed)
-   Stable, ID following as an outpatient closely -Will resume home medication including Raltegravir , Emtricitabine-tenofovir

## 2022-10-16 NOTE — Assessment & Plan Note (Addendum)
History of hepatitis C with hepatic liver cirrhosis, and esophageal varices -Monitoring closely -LFTs mildly elevated, avoiding hepatotoxins    Latest Ref Rng & Units 10/16/2022   10:41 AM 06/11/2022    9:42 AM 10/09/2017    3:02 PM  Hepatic Function  Total Protein 6.5 - 8.1 g/dL 8.5  8.4  7.7   Albumin 3.5 - 5.0 g/dL 2.7  2.9  2.8   AST 15 - 41 U/L 84  54  91   ALT 0 - 44 U/L 48  34  53   Alk Phosphatase 38 - 126 U/L 92  137  162   Total Bilirubin 0.3 - 1.2 mg/dL 1.0  0.4  1.0    -Monitoring

## 2022-10-17 ENCOUNTER — Observation Stay (HOSPITAL_COMMUNITY): Payer: Medicare Other

## 2022-10-17 ENCOUNTER — Other Ambulatory Visit: Payer: Self-pay

## 2022-10-17 DIAGNOSIS — K2081 Other esophagitis with bleeding: Secondary | ICD-10-CM | POA: Diagnosis present

## 2022-10-17 DIAGNOSIS — K56609 Unspecified intestinal obstruction, unspecified as to partial versus complete obstruction: Secondary | ICD-10-CM | POA: Diagnosis present

## 2022-10-17 DIAGNOSIS — D8481 Immunodeficiency due to conditions classified elsewhere: Secondary | ICD-10-CM | POA: Diagnosis present

## 2022-10-17 DIAGNOSIS — A6002 Herpesviral infection of other male genital organs: Secondary | ICD-10-CM | POA: Diagnosis present

## 2022-10-17 DIAGNOSIS — E785 Hyperlipidemia, unspecified: Secondary | ICD-10-CM | POA: Diagnosis present

## 2022-10-17 DIAGNOSIS — K920 Hematemesis: Secondary | ICD-10-CM | POA: Diagnosis not present

## 2022-10-17 DIAGNOSIS — N400 Enlarged prostate without lower urinary tract symptoms: Secondary | ICD-10-CM | POA: Diagnosis present

## 2022-10-17 DIAGNOSIS — J99 Respiratory disorders in diseases classified elsewhere: Secondary | ICD-10-CM | POA: Diagnosis present

## 2022-10-17 DIAGNOSIS — N401 Enlarged prostate with lower urinary tract symptoms: Secondary | ICD-10-CM | POA: Diagnosis present

## 2022-10-17 DIAGNOSIS — J4489 Other specified chronic obstructive pulmonary disease: Secondary | ICD-10-CM | POA: Diagnosis present

## 2022-10-17 DIAGNOSIS — R41841 Cognitive communication deficit: Secondary | ICD-10-CM | POA: Diagnosis present

## 2022-10-17 DIAGNOSIS — K219 Gastro-esophageal reflux disease without esophagitis: Secondary | ICD-10-CM | POA: Diagnosis present

## 2022-10-17 DIAGNOSIS — Z21 Asymptomatic human immunodeficiency virus [HIV] infection status: Secondary | ICD-10-CM | POA: Diagnosis present

## 2022-10-17 DIAGNOSIS — A539 Syphilis, unspecified: Secondary | ICD-10-CM | POA: Diagnosis present

## 2022-10-17 DIAGNOSIS — I10 Essential (primary) hypertension: Secondary | ICD-10-CM | POA: Diagnosis present

## 2022-10-17 DIAGNOSIS — Z7901 Long term (current) use of anticoagulants: Secondary | ICD-10-CM | POA: Diagnosis not present

## 2022-10-17 DIAGNOSIS — K6389 Other specified diseases of intestine: Secondary | ICD-10-CM | POA: Diagnosis not present

## 2022-10-17 DIAGNOSIS — K5900 Constipation, unspecified: Secondary | ICD-10-CM | POA: Diagnosis present

## 2022-10-17 DIAGNOSIS — M1732 Unilateral post-traumatic osteoarthritis, left knee: Secondary | ICD-10-CM | POA: Diagnosis present

## 2022-10-17 DIAGNOSIS — Z4682 Encounter for fitting and adjustment of non-vascular catheter: Secondary | ICD-10-CM | POA: Diagnosis not present

## 2022-10-17 DIAGNOSIS — L7682 Other postprocedural complications of skin and subcutaneous tissue: Secondary | ICD-10-CM | POA: Diagnosis present

## 2022-10-17 DIAGNOSIS — K209 Esophagitis, unspecified without bleeding: Secondary | ICD-10-CM | POA: Diagnosis not present

## 2022-10-17 DIAGNOSIS — K7469 Other cirrhosis of liver: Secondary | ICD-10-CM | POA: Diagnosis present

## 2022-10-17 DIAGNOSIS — I700 Atherosclerosis of aorta: Secondary | ICD-10-CM | POA: Diagnosis present

## 2022-10-17 DIAGNOSIS — I8510 Secondary esophageal varices without bleeding: Secondary | ICD-10-CM | POA: Diagnosis present

## 2022-10-17 DIAGNOSIS — D649 Anemia, unspecified: Secondary | ICD-10-CM | POA: Diagnosis present

## 2022-10-17 DIAGNOSIS — M1990 Unspecified osteoarthritis, unspecified site: Secondary | ICD-10-CM | POA: Diagnosis present

## 2022-10-17 DIAGNOSIS — T8454XD Infection and inflammatory reaction due to internal left knee prosthesis, subsequent encounter: Secondary | ICD-10-CM | POA: Diagnosis not present

## 2022-10-17 DIAGNOSIS — R112 Nausea with vomiting, unspecified: Secondary | ICD-10-CM | POA: Diagnosis present

## 2022-10-17 DIAGNOSIS — Z7951 Long term (current) use of inhaled steroids: Secondary | ICD-10-CM | POA: Diagnosis not present

## 2022-10-17 DIAGNOSIS — I8500 Esophageal varices without bleeding: Secondary | ICD-10-CM | POA: Diagnosis present

## 2022-10-17 DIAGNOSIS — E860 Dehydration: Secondary | ICD-10-CM | POA: Diagnosis present

## 2022-10-17 DIAGNOSIS — R748 Abnormal levels of other serum enzymes: Secondary | ICD-10-CM | POA: Diagnosis present

## 2022-10-17 DIAGNOSIS — F0390 Unspecified dementia without behavioral disturbance: Secondary | ICD-10-CM | POA: Diagnosis present

## 2022-10-17 DIAGNOSIS — E441 Mild protein-calorie malnutrition: Secondary | ICD-10-CM | POA: Diagnosis present

## 2022-10-17 DIAGNOSIS — F17210 Nicotine dependence, cigarettes, uncomplicated: Secondary | ICD-10-CM | POA: Diagnosis present

## 2022-10-17 DIAGNOSIS — Z96652 Presence of left artificial knee joint: Secondary | ICD-10-CM | POA: Diagnosis present

## 2022-10-17 DIAGNOSIS — K922 Gastrointestinal hemorrhage, unspecified: Secondary | ICD-10-CM | POA: Diagnosis not present

## 2022-10-17 DIAGNOSIS — M6281 Muscle weakness (generalized): Secondary | ICD-10-CM | POA: Diagnosis present

## 2022-10-17 DIAGNOSIS — G4700 Insomnia, unspecified: Secondary | ICD-10-CM | POA: Diagnosis present

## 2022-10-17 DIAGNOSIS — B20 Human immunodeficiency virus [HIV] disease: Secondary | ICD-10-CM | POA: Diagnosis present

## 2022-10-17 DIAGNOSIS — K2211 Ulcer of esophagus with bleeding: Secondary | ICD-10-CM | POA: Diagnosis present

## 2022-10-17 DIAGNOSIS — F1011 Alcohol abuse, in remission: Secondary | ICD-10-CM | POA: Diagnosis present

## 2022-10-17 DIAGNOSIS — J449 Chronic obstructive pulmonary disease, unspecified: Secondary | ICD-10-CM | POA: Diagnosis present

## 2022-10-17 DIAGNOSIS — B182 Chronic viral hepatitis C: Secondary | ICD-10-CM | POA: Diagnosis present

## 2022-10-17 DIAGNOSIS — Y792 Prosthetic and other implants, materials and accessory orthopedic devices associated with adverse incidents: Secondary | ICD-10-CM | POA: Diagnosis present

## 2022-10-17 DIAGNOSIS — K7460 Unspecified cirrhosis of liver: Secondary | ICD-10-CM | POA: Diagnosis present

## 2022-10-17 DIAGNOSIS — Z6828 Body mass index (BMI) 28.0-28.9, adult: Secondary | ICD-10-CM | POA: Diagnosis not present

## 2022-10-17 DIAGNOSIS — Z79899 Other long term (current) drug therapy: Secondary | ICD-10-CM | POA: Diagnosis not present

## 2022-10-17 LAB — BASIC METABOLIC PANEL
Anion gap: 8 (ref 5–15)
BUN: 46 mg/dL — ABNORMAL HIGH (ref 8–23)
CO2: 18 mmol/L — ABNORMAL LOW (ref 22–32)
Calcium: 8.3 mg/dL — ABNORMAL LOW (ref 8.9–10.3)
Chloride: 107 mmol/L (ref 98–111)
Creatinine, Ser: 1.42 mg/dL — ABNORMAL HIGH (ref 0.61–1.24)
GFR, Estimated: 53 mL/min — ABNORMAL LOW (ref >60)
Glucose, Bld: 92 mg/dL (ref 70–99)
Potassium: 4.6 mmol/L (ref 3.5–5.1)
Sodium: 133 mmol/L — ABNORMAL LOW (ref 135–145)

## 2022-10-17 LAB — APTT: aPTT: 30 s (ref 24–36)

## 2022-10-17 LAB — CBC
HCT: 32 % — ABNORMAL LOW (ref 39.0–52.0)
Hemoglobin: 9.5 g/dL — ABNORMAL LOW (ref 13.0–17.0)
MCH: 24.7 pg — ABNORMAL LOW (ref 26.0–34.0)
MCHC: 29.7 g/dL — ABNORMAL LOW (ref 30.0–36.0)
MCV: 83.3 fL (ref 80.0–100.0)
Platelets: 286 10*3/uL (ref 150–400)
RBC: 3.84 MIL/uL — ABNORMAL LOW (ref 4.22–5.81)
RDW: 19.4 % — ABNORMAL HIGH (ref 11.5–15.5)
WBC: 8.4 10*3/uL (ref 4.0–10.5)
nRBC: 0.2 % (ref 0.0–0.2)

## 2022-10-17 LAB — PROTIME-INR
INR: 1.4 — ABNORMAL HIGH (ref 0.8–1.2)
Prothrombin Time: 17.2 s — ABNORMAL HIGH (ref 11.4–15.2)

## 2022-10-17 LAB — GLUCOSE, CAPILLARY: Glucose-Capillary: 105 mg/dL — ABNORMAL HIGH (ref 70–99)

## 2022-10-17 LAB — VANCOMYCIN, RANDOM: Vancomycin Rm: <4 ug/mL

## 2022-10-17 MED ORDER — VANCOMYCIN HCL 1500 MG/300ML IV SOLN
1500.0000 mg | Freq: Once | INTRAVENOUS | Status: AC
  Administered 2022-10-17: 1500 mg via INTRAVENOUS
  Filled 2022-10-17: qty 300

## 2022-10-17 MED ORDER — VANCOMYCIN HCL 1250 MG/250ML IV SOLN
1250.0000 mg | INTRAVENOUS | Status: DC
  Administered 2022-10-18 – 2022-10-21 (×4): 1250 mg via INTRAVENOUS
  Filled 2022-10-17 (×4): qty 250

## 2022-10-17 MED ORDER — SODIUM CHLORIDE 0.9% FLUSH: 10.0000 mL | INTRAVENOUS | Status: DC | PRN

## 2022-10-17 MED ORDER — CHLORHEXIDINE GLUCONATE CLOTH 2 % EX PADS
6.0000 | MEDICATED_PAD | Freq: Every day | CUTANEOUS | Status: DC
  Administered 2022-10-17 – 2022-10-22 (×6): 6 via TOPICAL

## 2022-10-17 MED ORDER — BISACODYL 10 MG RE SUPP
10.0000 mg | Freq: Two times a day (BID) | RECTAL | Status: DC
  Administered 2022-10-17 – 2022-10-20 (×6): 10 mg via RECTAL
  Filled 2022-10-17 (×7): qty 1

## 2022-10-17 MED ORDER — SODIUM CHLORIDE 0.9% FLUSH
10.0000 mL | Freq: Two times a day (BID) | INTRAVENOUS | Status: DC
  Administered 2022-10-17 – 2022-10-22 (×9): 10 mL

## 2022-10-17 NOTE — Progress Notes (Signed)
PHARMACY CONSULT NOTE FOR:  OUTPATIENT  PARENTERAL ANTIBIOTIC THERAPY (OPAT)  Indication: Recurrent corynebacterium L-knee PJI Regimen: Vancomycin 1250 mg IV every 24 hours End date: 11/19/2022  IV antibiotic discharge orders are pended. To discharging provider:  please sign these orders via discharge navigator,  Select New Orders & click on the button choice - Manage This Unsigned Work.    Thank you for allowing pharmacy to be a part of this patient's care.  Georgina Pillion, PharmD, BCPS, BCIDP Infectious Diseases Clinical Pharmacist 10/17/2022 4:55 PM   **Pharmacist phone directory can now be found on [email or web address] ([email or web address]).  Listed under [facility].

## 2022-10-17 NOTE — Progress Notes (Addendum)
PROGRESS NOTE    Patient: Logan Levine                            PCP: Toma Deiters, MD                    DOB: [birth year]            DOA: 10/16/2022 [id number]             DOS: 10/17/2022, 12:22 PM   LOS: 0 days   Date of Service: The patient was seen and examined on 10/17/2022  Subjective:   The patient was seen and examined this morning. Hemodynamically stable. NG tube was placed last night, he subsequently removed it Denies of having nausea vomiting this morning, reporting he is hungry and thirsty   Brief Narrative:   Logan Levine is a 72 year old male with extensive history of chronic appetitive C with liver cirrhosis, HIV, anemia, BPH, chronic bronchitis, COPD, GERD,h/o genital herpes, syphilis, HTN, HLD, recent (10/08/22)  left excisional total knee arthroplasty due to infection-on IV antibiotic and Eliquis.  Currently residing in rehab center on IV antibiotics of vancomycin and Eliquis for 19 days.  Patient will discharge 3 days ago on 10/10/2022 from [facility].    Patient presenting to ED from [facility]hab with chief complaint of vomiting x 5 today bloody emesis was noted by the staff. On arrival in ED patient's emesis was clear, normal  Hemoglobin currently 10.5 (improved from previous admission 8.7, 8.3, now 10.5)  Sodium 131, BUN 42 creatinine 1.25,  CT abdomen/pelvis IMPRESSION: 1. Findings are compatible with small bowel obstruction with transition point identified within the left hemiabdomen. 2. Cirrhosis.  No splenomegaly or ascites. 3. Circumferential wall thickening involving the visualized portions of the distal esophagus. Esophageal varices identified with irregular hyperenhancement along the wall of the esophagus. 4. High attenuation material is noted layering within the dependent portion of the gastric fundus. This is indeterminate and may be seen with contrast ingestion. In a patient presenting with bloody emesis (as  reported in the clinical history) blood products within the lumen of the stomach cannot be excluded. 5. Sigmoid diverticulosis without signs of acute diverticulitis. 6. Fat containing inguinal hernias. 7.  Aortic Atherosclerosis    EDP has consulted and notified gastroenterologist and general surgery--requested patient to be admitted for further evaluation    Assessment & Plan:   Principal Problem:   Gastrointestinal hemorrhage with hematemesis Active Problems:   Small bowel obstruction (HCC)   Infection of prosthetic joint (HCC)   Dementia without behavioral disturbance (HCC)   HIV DISEASE   HYPERTENSION, BENIGN ESSENTIAL   Hepatic cirrhosis due to chronic hepatitis C infection (HCC)   Acute esophagitis   Secondary esophageal varices without bleeding (HCC)   GIB (gastrointestinal bleeding)     Assessment and Plan: * Gastrointestinal hemorrhage with hematemesis - GI bleed, likely due to Eliquis which has been started by orthopedic team in the recent discharge for DVT prophylaxis -Patient also has liver cirrhosis due to chronic hepatitis C, and esophageal varices identified on the CT scan -Monitoring H&H closely    Latest Ref Rng & Units 10/16/2022   10:41 AM 10/10/2022    4:35 AM 10/09/2022    3:47 AM  CBC  WBC 4.0 - 10.5 K/uL 10.2  12.5  6.7   Hemoglobin 13.0 - 17.0 g/dL 29.5  8.3  8.7   Hematocrit  39.0 - 52.0 % 34.0  27.6  28.5   Platelets 150 - 400 K/uL 284  249  216    -10/17/2022 s/p EGD findings:Erosive esophagitis with bleeding, grade 2 esophageal varices nonbleeding, retained blood and due to need   -H&H remained stable  -IV Protonix and octreotide initiated  -GI and general surgery consulted appreciated further evaluation and recommendations  Small bowel obstruction (HCC) - N.p.o. -Patient has removed the NG tube which was placed last night  POA:  Nausea vomiting x 5 today -initially was bloody now clear -If further vomiting will initiate NG tube  placement -Finding of SBO per CT findings  -Continuing IV fluids -General Surgery following -Will monitor closely  Infection of prosthetic joint (HCC) - Followed closely by ID team as an outpatient -Was recently was on Rocephin and vancomycin, antibiotics has been narrowed down to IV vancomycin only -Currently afebrile, normotensive -Patient has a PICC line right arm, planning to exchange it today -ID consulted  Dementia without behavioral disturbance (HCC) Currently stable, monitoring, -Not on any medications  Secondary esophageal varices without bleeding (HCC) - Improved nausea vomiting, no signs of active bleeding at this point  Hepatic cirrhosis due to chronic hepatitis C infection (HCC) History of hepatitis C with hepatic liver cirrhosis, and esophageal varices -Monitoring closely -LFTs mildly elevated, avoiding hepatotoxins    Latest Ref Rng & Units 10/16/2022   10:41 AM 06/11/2022    9:42 AM 10/09/2017    3:02 PM  Hepatic Function  Total Protein 6.5 - 8.1 g/dL 8.5  8.4  7.7   Albumin 3.5 - 5.0 g/dL 2.7  2.9  2.8   AST 15 - 41 U/L 84  54  91   ALT 0 - 44 U/L 48  34  53   Alk Phosphatase 38 - 126 U/L 92  137  162   Total Bilirubin 0.3 - 1.2 mg/dL 1.0  0.4  1.0    -Monitoring  HYPERTENSION, BENIGN ESSENTIAL Stable, resume losartan -once tolerating p.o. -Utilizing as needed hydralazine IV  HIV DISEASE - Stable, ID following as an outpatient closely -Will resume home medication including Raltegravir , Emtricitabine-tenofovir         ------------------------------------------------------------------------------------------------------------------------------------------------  DVT prophylaxis:  TED hose Start: 10/16/22 1359 SCDs Start: 10/16/22 1359   Code Status:   Code Status: Full Code  Family Communication: No family member present at bedside-patient is alert oriented x 4,-updated  Admission status:   Status is: Observation The patient remains OBS  appropriate and will d/c before 2 midnights.   Disposition: From  - home             Planning for discharge in 1-2 days: to   Procedures:   No admission procedures for hospital encounter.   Antimicrobials:  Anti-infectives (From admission, onward)    Start     Dose/Rate Route Frequency Ordered Stop   10/16/22 1415  cefTRIAXone (ROCEPHIN) 1 g in sodium chloride 0.9 % 100 mL IVPB        1 g 200 mL/hr over 30 Minutes Intravenous  Once 10/16/22 1402 10/16/22 1516        Medication:   Chlorhexidine Gluconate Cloth  6 each Topical Daily   pantoprazole  40 mg Intravenous Q12H   sodium chloride flush  3 mL Intravenous Q12H    acetaminophen **OR** acetaminophen, bisacodyl, hydrALAZINE, HYDROmorphone (DILAUDID) injection, ipratropium, levalbuterol, ondansetron **OR** ondansetron (ZOFRAN) IV, oxyCODONE, senna-docusate, sodium phosphate, traZODone   Objective:   Vitals:   10/16/22 1829 10/16/22  2043 10/17/22 0437 10/17/22 1209  BP: 101/65 105/72 109/80 (!) 99/57  Pulse: 86 83 94 93  Resp: 17 16 18 17   Temp: 98 F (36.7 C) 98.7 F (37.1 C) 98.3 F (36.8 C) 98.7 F (37.1 C)  TempSrc: Oral Oral Oral Oral  SpO2: 100% 99% 98% 96%  Weight:   82.4 kg   Height:        Intake/Output Summary (Last 24 hours) at 10/17/2022 1222 Last data filed at 10/17/2022 0448 Gross per 24 hour  Intake 400 ml  Output 1450 ml  Net -1050 ml   Filed Weights   10/16/22 1600 10/17/22 0437  Weight: 87 kg 82.4 kg     Physical examination:   Constitution:  Alert, cooperative, no distress,  Appears calm and comfortable  Psychiatric:   Normal and stable mood and affect, cognition intact,   HEENT:        Normocephalic, PERRL, otherwise with in Normal limits  Chest:         Chest symmetric Cardio vascular:  S1/S2, RRR, No murmure, No Rubs or Gallops  pulmonary: Clear to auscultation bilaterally, respirations unlabored, negative wheezes / crackles Abdomen: Soft, non-tender, distended, hypoactive  bowel sounds no masses, no organomegaly Muscular skeletal: Left knee immobilizer, left knee wound Limited exam - in bed, able to move all 4 extremities,   Neuro: CNII-XII intact. , normal motor and sensation, reflexes intact  Extremities: No pitting edema lower extremities, +2 pulses  Skin: Dry, warm to touch, negative for any Rashes, No open wounds Wounds: per nursing documentation-left knee wound Right arm PICC line   ------------------------------------------------------------------------------------------------------------------------------------------    LABs:     Latest Ref Rng & Units 10/17/2022    5:03 AM 10/16/2022    3:44 PM 10/16/2022   10:41 AM  CBC  WBC 4.0 - 10.5 K/uL 8.4   10.2   Hemoglobin 13.0 - 17.0 g/dL 9.5  9.4  16.1   Hematocrit 39.0 - 52.0 % 32.0  30.7  34.0   Platelets 150 - 400 K/uL 286   284       Latest Ref Rng & Units 10/17/2022    5:03 AM 10/16/2022   10:41 AM 10/10/2022   10:14 AM  CMP  Glucose 70 - 99 mg/dL 92  096  045   BUN 8 - 23 mg/dL 46  42  32   Creatinine 0.61 - 1.24 mg/dL 4.09  8.11  9.14   Sodium 135 - 145 mmol/L 133  131  131   Potassium 3.5 - 5.1 mmol/L 4.6  4.3  5.4   Chloride 98 - 111 mmol/L 107  100  103   CO2 22 - 32 mmol/L 18  22  21    Calcium 8.9 - 10.3 mg/dL 8.3  8.5  8.7   Total Protein 6.5 - 8.1 g/dL  8.5    Total Bilirubin 0.3 - 1.2 mg/dL  1.0    Alkaline Phos 38 - 126 U/L  92    AST 15 - 41 U/L  84    ALT 0 - 44 U/L  48         Micro Results Recent Results (from the past 240 hour(s))  Aerobic/Anaerobic Culture w Gram Stain (surgical/deep wound)     Status: None (Preliminary result)   Collection Time: 10/08/22  3:58 PM   Specimen: Synovial, Left Knee; Body Fluid  Result Value Ref Range Status   Specimen Description   Final    SYNOVIAL LEFT KNEE Performed at  [facility], [address] [place], [place] [address]    Special Requests   Final    NONE Performed at [facility],  [address], [place], [place] [address]    Gram Stain NO WBC SEEN NO ORGANISMS SEEN   Final   Culture   Final    RARE CORYNEBACTERIUM STRIATUM NO ANAEROBES ISOLATED SUSCEPTIBILITIES TO FOLLOW Sent to [facility] for further susceptibility testing. Performed at [facility], [address], [place], [place] [address]    Report Status PENDING  Incomplete  Susceptibility, Aer + Anaerob     Status: Abnormal   Collection Time: 10/08/22  3:58 PM  Result Value Ref Range Status   Suscept, Aer + Anaerob REFERT (A)  Corrected    Comment: (NOTE) Please refer to the following specimen for additional lab results. Please refer to spec # [phone] for test result. Dorquita Vanhook notified 10-15-2022 at 11:30 AM. Performed At: [facility] [address] [place], [place] [id number] Jolene Schimke MD NG:[id number] CORRECTED ON 09/11 AT 1136: PREVIOUSLY REPORTED AS Preliminary report    Source of Sample   Final    [id number], Rondel Oh, VANC, DAPTO, Joesph July, TETIZOLD, MINOCYCLINE, DOXY, OMADOCYCLINE    Comment: Performed at [facility], [address] [place], [place] [address]  Susceptibility Result     Status: Abnormal   Collection Time: 10/08/22  3:58 PM  Result Value Ref Range Status   Suscept Result 1 Comment (A)  Final    Comment: (NOTE) Corynebacterium striatum Identification performed by account, not confirmed by this laboratory. Performed At: [facility] [address] [place], [place] [id number] Jolene Schimke MD ZD:[id number]     Radiology Reports DG Abd 1 View  Result Date: 10/17/2022 CLINICAL DATA:  Small bowel obstruction EXAM: ABDOMEN - 1 VIEW COMPARISON:  CT abdomen pelvis, 10/16/2022 FINDINGS: Diffusely gas-filled, although not overtly distended small bowel and colon, with gas and stool present to the rectum. No obvious free air on partial single supine radiograph of the abdomen. IMPRESSION: Diffusely gas-filled, although not overtly  distended small bowel and colon, with gas and stool present to the rectum. Electronically Signed   By: Jearld Lesch M.D.   On: 10/17/2022 11:17   [place] EKG SITE RITE  Result Date: 10/17/2022 If Site Rite image not attached, placement could not be confirmed due to current cardiac rhythm.  CT ABDOMEN PELVIS W CONTRAST  Result Date: 10/16/2022 CLINICAL DATA:  Acute abdominal pain with vomiting for 5 days. Blood in emesis today. EXAM: CT ABDOMEN AND PELVIS WITH CONTRAST TECHNIQUE: Multidetector CT imaging of the abdomen and pelvis was performed using the standard protocol following bolus administration of intravenous contrast. RADIATION DOSE REDUCTION: This exam was performed according to the departmental dose-optimization program which includes automated exposure control, adjustment of the mA and/or kV according to patient size and/or use of iterative reconstruction technique. CONTRAST:  OMNIPAQUE IOHEXOL 300 MG/ML  SOLN COMPARISON:  05/25/2007 FINDINGS: Lower chest: The lung bases are clear. No pleural effusion or consolidative change. There is circumferential wall thickening involving the visualized portions of the distal esophagus. Esophageal varices identified with hyperenhancement along the wall of the esophagus. Hepatobiliary: The liver has an atrophic appearance with diffusely nodular contour compatible with cirrhosis. Scattered calcified granulomas. No enhancing liver lesions. Cholecystectomy. No bile duct dilatation. Pancreas: Unremarkable. No pancreatic ductal dilatation or surrounding inflammatory changes. Spleen: Normal in size without focal abnormality. Adrenals/Urinary Tract: Right adrenal nodule measures 1 cm  and is unchanged from 2009 compatible with a benign adenoma. No follow-up imaging recommended. Normal left adrenal gland. Two left kidney cysts are identified. The largest arises off the posterior aspect of the left upper pole measuring 6.5 cm. No nephrolithiasis or hydronephrosis.  Urinary bladder appears within normal limits. Stomach/Bowel: Moderate distension of the stomach with air-fluid level. High density material layers within the posterior, dependent aspect of the gastric fundus, image 17/3. The proximal small bowel loops are dilated with multiple air-fluid levels measuring up to 4.2 cm. Decreased caliber distal small bowel loops. The transition point is identified within the left hemiabdomen, image 38/3 no abnormal dilatation of the colon. Sigmoid diverticulosis without signs of acute diverticulitis. Vascular/Lymphatic: Aortic atherosclerosis without aneurysm. No signs of abdominal adenopathy or pelvic adenopathy. The upper abdominal vascularity appears patent. Reproductive: Prostate is unremarkable. Other: No ascites or focal fluid collections identified. Fat containing inguinal hernias noted. 2 Musculoskeletal: Multilevel degenerative disc disease identified within the lumbar spine. No acute or suspicious osseous findings. IMPRESSION: 1. Findings are compatible with small bowel obstruction with transition point identified within the left hemiabdomen. 2. Cirrhosis.  No splenomegaly or ascites. 3. Circumferential wall thickening involving the visualized portions of the distal esophagus. Esophageal varices identified with irregular hyperenhancement along the wall of the esophagus. 4. High attenuation material is noted layering within the dependent portion of the gastric fundus. This is indeterminate and may be seen with contrast ingestion. In a patient presenting with bloody emesis (as reported in the clinical history) blood products within the lumen of the stomach cannot be excluded. 5. Sigmoid diverticulosis without signs of acute diverticulitis. 6. Fat containing inguinal hernias. 7.  Aortic Atherosclerosis (ICD10-I70.0). These results were called by telephone at the time of interpretation on 10/16/2022 at 1:41 pm to provider [facility] , who verbally acknowledged these results.  Electronically Signed   By: Signa Kell M.D.   On: 10/16/2022 13:42    SIGNED: Kendell Bane, MD, FHM. FAAFP. [facility] - [facility]ist Time spent - 55 min.  In seeing, evaluating and examining the patient. Reviewing medical records, labs, drawn plan of care. [facility],  Pager (please use [email or web address] to page/ text) Please use Epic Secure Chat for non-urgent communication (7AM-7PM)  If 7PM-7AM, please contact night-coverage [email or web address], 10/17/2022, 12:22 PM

## 2022-10-17 NOTE — Progress Notes (Addendum)
Pharmacy Antibiotic Note  Logan Levine is a 72 y.o. male admitted on 10/16/2022 with  GI bleed and prosthetic joint infection .  Pharmacy has been consulted for vancomycin dosing.  Previously discharged 9/7 with OPAT orders to receive vancomycin 1250 mg IV every 24 hours and ceftriaxone 2 g IV every 24 hours for recurrent prosthetic joint infection with historical EOT 10/16. Cultures resulted after DC with corynebacterium striatum and was deescalated to vancomycin only. Last dose of vancomycin was reported as 9/8, unclear if was receiving outpatient. Vancomycin random level was undetectable. Will reevaluate need to extend therapy given possibility of gap in treatment.  Plan: Vancomycin 1500 mg IV x1 Vancomycin 1250 mg IV every 24 hours.  Goal trough 10-15 mcg/mL. Recalculated eAUC 500 with Scr 1.42 Monitor renal function, CBC, and signs of clinical improvement Will obtain vancomycin levels as clinically indicated per protocol  Height: 5\' 8"  (172.7 cm) Weight: 82.4 kg (181 lb 10.5 oz) IBW/kg (Calculated) : 68.4  Temp (24hrs), Avg:98.4 F (36.9 C), Min:98 F (36.7 C), Max:98.7 F (37.1 C)  Recent Labs  Lab 10/16/22 1041 10/17/22 0503 10/17/22 0901  WBC 10.2 8.4  --   CREATININE 1.25* 1.42*  --   LATICACIDVEN 1.3  --   --   VANCORANDOM  --   --  <4    Estimated Creatinine Clearance: 49.2 mL/min (A) (by C-G formula based on SCr of 1.42 mg/dL (H)).    Allergies  Allergen Reactions   Morphine Other (See Comments)    No reaction listed on MAR   Nsaids Other (See Comments)    No reaction listed on MAR    Antimicrobials this admission: CTX x1 Vancomycin 9/13>>   Thank you for allowing pharmacy to be a part of this patient's care.  Stephenie Acres, PharmD PGY1 Pharmacy Resident 10/17/2022 1:52 PM

## 2022-10-17 NOTE — Plan of Care (Signed)
  Problem: Acute Rehab OT Goals (only OT should resolve) Goal: Pt. Will Perform Grooming Flowsheets (Taken 10/17/2022 1451) Pt Will Perform Grooming:  with set-up  sitting Goal: Pt. Will Perform Upper Body Bathing Flowsheets (Taken 10/17/2022 1451) Pt Will Perform Upper Body Bathing:  Independently  sitting Goal: Pt. Will Transfer To Toilet Flowsheets (Taken 10/17/2022 1451) Pt Will Transfer to Toilet:  with min assist  stand pivot transfer  bedside commode  Lurena Joiner, OTR/L

## 2022-10-17 NOTE — Progress Notes (Signed)
[facility] Progress Note  1 Day Post-Op  Subjective: NG pulled out by patient in AM. Reports flatus. KUB done with gas in the colon and some dilated SB.  Feels less distended.   Objective: Vital signs in last 24 hours: Temp:  [98 F (36.7 C)-98.7 F (37.1 C)] 98.7 F (37.1 C) (09/13 1209) Pulse Rate:  [83-94] 93 (09/13 1209) Resp:  [16-18] 17 (09/13 1209) BP: (99-109)/(57-80) 99/57 (09/13 1209) SpO2:  [96 %-100 %] 96 % (09/13 1209) Weight:  [82.4 kg] 82.4 kg (09/13 0437) Last BM Date : 10/13/22  Intake/Output from previous day: 09/12 0701 - 09/13 0700 In: 900 [I.V.:400; IV Piggyback:500] Out: 1450 [Urine:700] Intake/Output this shift: No intake/output data recorded.  General appearance: alert and no distress GI: soft, distended, nontender   Lab Results:  Recent Labs    10/16/22 1041 10/16/22 1544 10/17/22 0503  WBC 10.2  --  8.4  HGB 10.5* 9.4* 9.5*  HCT 34.0* 30.7* 32.0*  PLT 284  --  286   BMET Recent Labs    10/16/22 1041 10/17/22 0503  NA 131* 133*  K 4.3 4.6  CL 100 107  CO2 22 18*  GLUCOSE 112* 92  BUN 42* 46*  CREATININE 1.25* 1.42*  CALCIUM 8.5* 8.3*   PT/INR Recent Labs    10/16/22 1041 10/17/22 0503  LABPROT 15.8* 17.2*  INR 1.2 1.4*    Studies/Results: DG Abd 1 View  Result Date: 10/17/2022 CLINICAL DATA:  Small bowel obstruction EXAM: ABDOMEN - 1 VIEW COMPARISON:  CT abdomen pelvis, 10/16/2022 FINDINGS: Diffusely gas-filled, although not overtly distended small bowel and colon, with gas and stool present to the rectum. No obvious free air on partial single supine radiograph of the abdomen. IMPRESSION: Diffusely gas-filled, although not overtly distended small bowel and colon, with gas and stool present to the rectum. Electronically Signed   By: Jearld Lesch M.D.   On: 10/17/2022 11:17   [place] EKG SITE RITE  Result Date: 10/17/2022 If Site Rite image not attached, placement could not be confirmed due to current cardiac  rhythm.  CT ABDOMEN PELVIS W CONTRAST  Result Date: 10/16/2022 CLINICAL DATA:  Acute abdominal pain with vomiting for 5 days. Blood in emesis today. EXAM: CT ABDOMEN AND PELVIS WITH CONTRAST TECHNIQUE: Multidetector CT imaging of the abdomen and pelvis was performed using the standard protocol following bolus administration of intravenous contrast. RADIATION DOSE REDUCTION: This exam was performed according to the departmental dose-optimization program which includes automated exposure control, adjustment of the mA and/or kV according to patient size and/or use of iterative reconstruction technique. CONTRAST:  OMNIPAQUE IOHEXOL 300 MG/ML  SOLN COMPARISON:  05/25/2007 FINDINGS: Lower chest: The lung bases are clear. No pleural effusion or consolidative change. There is circumferential wall thickening involving the visualized portions of the distal esophagus. Esophageal varices identified with hyperenhancement along the wall of the esophagus. Hepatobiliary: The liver has an atrophic appearance with diffusely nodular contour compatible with cirrhosis. Scattered calcified granulomas. No enhancing liver lesions. Cholecystectomy. No bile duct dilatation. Pancreas: Unremarkable. No pancreatic ductal dilatation or surrounding inflammatory changes. Spleen: Normal in size without focal abnormality. Adrenals/Urinary Tract: Right adrenal nodule measures 1 cm and is unchanged from 2009 compatible with a benign adenoma. No follow-up imaging recommended. Normal left adrenal gland. Two left kidney cysts are identified. The largest arises off the posterior aspect of the left upper pole measuring 6.5 cm. No nephrolithiasis or hydronephrosis. Urinary bladder appears within normal limits. Stomach/Bowel: Moderate distension of  the stomach with air-fluid level. High density material layers within the posterior, dependent aspect of the gastric fundus, image 17/3. The proximal small bowel loops are dilated with multiple air-fluid  levels measuring up to 4.2 cm. Decreased caliber distal small bowel loops. The transition point is identified within the left hemiabdomen, image 38/3 no abnormal dilatation of the colon. Sigmoid diverticulosis without signs of acute diverticulitis. Vascular/Lymphatic: Aortic atherosclerosis without aneurysm. No signs of abdominal adenopathy or pelvic adenopathy. The upper abdominal vascularity appears patent. Reproductive: Prostate is unremarkable. Other: No ascites or focal fluid collections identified. Fat containing inguinal hernias noted. 2 Musculoskeletal: Multilevel degenerative disc disease identified within the lumbar spine. No acute or suspicious osseous findings. IMPRESSION: 1. Findings are compatible with small bowel obstruction with transition point identified within the left hemiabdomen. 2. Cirrhosis.  No splenomegaly or ascites. 3. Circumferential wall thickening involving the visualized portions of the distal esophagus. Esophageal varices identified with irregular hyperenhancement along the wall of the esophagus. 4. High attenuation material is noted layering within the dependent portion of the gastric fundus. This is indeterminate and may be seen with contrast ingestion. In a patient presenting with bloody emesis (as reported in the clinical history) blood products within the lumen of the stomach cannot be excluded. 5. Sigmoid diverticulosis without signs of acute diverticulitis. 6. Fat containing inguinal hernias. 7.  Aortic Atherosclerosis (ICD10-I70.0). These results were called by telephone at the time of interpretation on 10/16/2022 at 1:41 pm to provider [facility] , who verbally acknowledged these results. Electronically Signed   By: Signa Kell M.D.   On: 10/16/2022 13:42   DG Chest Port 1 View  Result Date: 10/16/2022 CLINICAL DATA:  Vomiting, upper abdominal pain. EXAM: PORTABLE CHEST 1 VIEW COMPARISON:  Jun 25, 2009. FINDINGS: The heart size and mediastinal contours are within  normal limits. Hypoinflation of the lungs. Both lungs are clear. Severe degenerative changes seen involving the left glenohumeral joint. Right-sided PICC line is noted with distal tip in expected position of the SVC. IMPRESSION: Hypoinflation of the lungs.  No acute pulmonary abnormality seen. Electronically Signed   By: Lupita Raider M.D.   On: 10/16/2022 11:46    Anti-infectives: Anti-infectives (From admission, onward)    Start     Dose/Rate Route Frequency Ordered Stop   10/18/22 1400  vancomycin (VANCOREADY) IVPB 1250 mg/250 mL        1,250 mg 166.7 mL/hr over 90 Minutes Intravenous Every 24 hours 10/17/22 1330     10/17/22 1415  vancomycin (VANCOREADY) IVPB 1500 mg/300 mL        1,500 mg 150 mL/hr over 120 Minutes Intravenous  Once 10/17/22 1318 10/17/22 1614   10/16/22 1415  cefTRIAXone (ROCEPHIN) 1 g in sodium chloride 0.9 % 100 mL IVPB        1 g 200 mL/hr over 30 Minutes Intravenous  Once 10/16/22 1402 10/16/22 1516       Assessment/Plan: Patient with SBO. Doing fair. NG pulled out this AM. KUB reassuring but not completely resolved. Flatus reassuring. Told patient he can have sips of water or juice now but no tray.  Will monitor Suppository BID ordered  May need NG back if has vomiting/ nausea   LOS: 0 days    Lucretia Roers 10/17/2022

## 2022-10-17 NOTE — Progress Notes (Signed)
Patient pulled out NG tube. Stated, " my mind was on something else and I pulled it out". MD notified.

## 2022-10-17 NOTE — TOC Initial Note (Signed)
Transition of Care [facility]) - Initial/Assessment Note    Patient Details  Name: Logan Levine MRN: [id number] Date of Birth: Jun 30, 1950  [facility] CM/SW Contact:    Leitha Bleak, RN Phone Number: 10/17/2022, 2:33 PM  Clinical Narrative:          Patient admitted with gastrointestinal hemorrhage. Patient came in from [facility], post surgery at [facility]. Nursing staff had concerns with PICC line, dressing and no caps. Also looks like his knee immobolizer had not been take off since admission. TOC contacted Revonda Standard, she reported to the DON at [facility]. Med Surg manager number given for the DON to contact. CM spoke with patient friend to update and see if they want a different SNF. This is closest to her. She will check on him more and ask questions. She has not been well and had not been to visit at [facility].  New PICC line ordered. TOC following.          Expected Discharge Plan: Skilled Nursing Facility Barriers to Discharge: Continued Medical Work up  Patient Goals and CMS Choice      Expected Discharge Plan and Services      Living arrangements for the past 2 months: Single Family Home       Prior Living Arrangements/Services Living arrangements for the past 2 months: Single Family Home Lives with:: Self   Do you feel safe going back to the place where you live?: Yes            Activities of Daily Living     Permission Sought/Granted                  Emotional Assessment     Affect (typically observed): Accepting Orientation: : Oriented to Self, Oriented to Place, Oriented to  Time, Oriented to Situation Alcohol / Substance Use: Not Applicable Psych Involvement: No (comment)  Admission diagnosis:  GIB (gastrointestinal bleeding) [K92.2] Patient Active Problem List   Diagnosis Date Noted   GIB (gastrointestinal bleeding) 10/17/2022   Gastrointestinal hemorrhage with hematemesis 10/16/2022   Dementia without behavioral disturbance (HCC) 10/16/2022     Class: Chronic   Small bowel obstruction (HCC) 10/16/2022    Class: Acute   Acute esophagitis 10/16/2022   Secondary esophageal varices without bleeding (HCC) 10/16/2022   Infection of prosthetic joint (HCC) 10/08/2022   Traumatic hematoma of right knee 10/19/2017   Septic arthritis of knee, left (HCC) 05/04/2017   Hepatic cirrhosis due to chronic hepatitis C infection (HCC) 10/29/2016   Constipation 10/29/2016   TOTAL KNEE REPLACEMENT, LEFT, HX OF 11/12/2009   KNEE PAIN, ACUTE 11/01/2008   PYELONEPHRITIS, ACUTE 05/28/2007   HERPES, GENITAL NEC 11/19/2006   GOUT NOS 11/19/2006   SMOKER 11/19/2006   HYPERTENSION, BENIGN ESSENTIAL 11/19/2006   BRONCHITIS, CHRONIC NEC 11/19/2006   HERNIA, UMBILICAL 11/19/2006   HIV DISEASE 07/03/2006   HEPATITIS C 07/03/2006   SYPHILIS, EARLY, SYMPTOMATIC, PRIMARY NEC 07/03/2006   OSTEOARTHRITIS 07/03/2006   PCP:  Toma Deiters, MD Pharmacy:   [facility] - [place], [place] - [address] [place] [address] Phone: [phone] Fax: [phone]  [facility] [place], [place] - [address] [place] [address] Phone: [phone] Fax: [phone]  [facility] - [address] [place], [place] - [address] [place] [address] Phone: [phone] Fax: [phone]     Social Determinants of Health (SDOH) Social History: SDOH Screenings   Food  Insecurity: No Food Insecurity (10/08/2022)  Housing: Low Risk  (10/08/2022)  Transportation Needs: No Transportation Needs (10/08/2022)  Recent Concern: Transportation Needs - Unmet Transportation Needs (09/15/2022)   Received from [facility]  Utilities: Not At Risk (10/08/2022)  Financial Resource Strain: Low Risk  (06/09/2020)   [facility] from [facility], [facility]  Tobacco Use: Medium Risk (10/16/2022)   SDOH Interventions:    Readmission Risk Interventions     No data to display

## 2022-10-17 NOTE — Evaluation (Signed)
Physical Therapy Evaluation Patient Details Name: Logan Levine MRN: [id number] DOB: October 20, 1950 Today's Date: 10/17/2022  History of Present Illness  Logan Levine is a 72 year old male with extensive history of chronic appetitive C with liver cirrhosis, HIV, anemia, BPH, chronic bronchitis, COPD, GERD,h/o genital herpes, syphilis, HTN, HLD, recent (10/08/22)  left excisional total knee arthroplasty due to infection-on IV antibiotic and Eliquis.   Currently residing in rehab center on IV antibiotics of vancomycin and Eliquis for 19 days.     Patient will discharge 3 days ago on 10/10/2022 from [facility].       Patient presenting to ED from [facility] rehab with chief complaint of vomiting x 5 today bloody emesis was noted by the staff.  On arrival in ED patient's emesis was clear, normal   Clinical Impression  Patient demonstrates slow labored movement for sitting up at bedside with most difficulty moving LLE due to increasing pain.  Patient unable to fully extend RLE or use RW during sit to stands due to weakness, required Mod/max assist with right knee blocked to transfer to chair.  Patient tolerated sitting up in chair after therapy.  Patient will benefit from continued skilled physical therapy in hospital and recommended venue below to increase strength, balance, endurance for safe ADLs and gait.        If plan is discharge home, recommend the following: A lot of help with bathing/dressing/bathroom;A lot of help with walking and/or transfers;Help with stairs or ramp for entrance;Assistance with cooking/housework   Can travel by private vehicle   No    Equipment Recommendations None recommended by PT  Recommendations for Other Services       Functional Status Assessment Patient has had a recent decline in their functional status and demonstrates the ability to make significant improvements in function in a reasonable and predictable amount of time.     Precautions  / Restrictions Precautions Precautions: Fall Required Braces or Orthoses: Knee Immobilizer - Left Knee Immobilizer - Right: On when out of bed or walking Restrictions Weight Bearing Restrictions: Yes LLE Weight Bearing: Weight bearing as tolerated      Mobility  Bed Mobility Overal bed mobility: Needs Assistance Bed Mobility: Supine to Sit     Supine to sit: Mod assist, Max assist     General bed mobility comments: unable to move LLE due to increased pain    Transfers Overall transfer level: Needs assistance Equipment used: 1 person hand held assist Transfers: Sit to/from Stand, Bed to chair/wheelchair/BSC Sit to Stand: Mod assist Stand pivot transfers: Mod assist, Max assist         General transfer comment: unable to fully extend RLE or use RW during sit to stands due to weakness, required Mod/max assist with right knee blocked to transfer to chair    Ambulation/Gait                  Stairs            Wheelchair Mobility     Tilt Bed    Modified Rankin (Stroke Patients Only)       Balance Overall balance assessment: Needs assistance Sitting-balance support: Feet supported, No upper extremity supported Sitting balance-Leahy Scale: Fair Sitting balance - Comments: fair/good seated at EOB   Standing balance support: During functional activity, Bilateral upper extremity supported Standing balance-Leahy Scale: Poor Standing balance comment: using armrest of chair  Pertinent Vitals/Pain Pain Assessment Pain Assessment: 0-10 Pain Score: 8  Pain Location: Left knee Pain Descriptors / Indicators: Grimacing, Guarding, Aching, Sore Pain Intervention(s): Limited activity within patient's tolerance, Monitored during session, Repositioned    Home Living Family/patient expects to be discharged to:: Private residence Living Arrangements: Alone Available Help at Discharge: Family;Available  PRN/intermittently Type of Home: House Home Access: Ramped entrance       Home Layout: One level Home Equipment: None      Prior Function Prior Level of Function : Independent/Modified Independent             Mobility Comments: transfers only, w/c level, uses UEs for propulsion ADLs Comments: bathing at sink     Extremity/Trunk Assessment   Upper Extremity Assessment Upper Extremity Assessment: Defer to OT evaluation    Lower Extremity Assessment Lower Extremity Assessment: Generalized weakness;LLE deficits/detail LLE Deficits / Details: grossly2/5 with c/o increased knee pain with movement LLE: Unable to fully assess due to pain LLE Sensation: WNL LLE Coordination: WNL    Cervical / Trunk Assessment Cervical / Trunk Assessment: Normal  Communication   Communication Communication: No apparent difficulties  Cognition Arousal: Alert Behavior During Therapy: WFL for tasks assessed/performed Overall Cognitive Status: Within Functional Limits for tasks assessed                                          General Comments      Exercises     Assessment/Plan    PT Assessment Patient needs continued PT services  PT Problem List Decreased strength;Decreased activity tolerance;Decreased balance;Decreased mobility;Decreased range of motion       PT Treatment Interventions DME instruction;Balance training;Functional mobility training;Therapeutic activities;Therapeutic exercise;Patient/family education;Wheelchair mobility training    PT Goals (Current goals can be found in the Care Plan section)  Acute Rehab PT Goals Patient Stated Goal: return home after rehab PT Goal Formulation: With patient Time For Goal Achievement: 10/31/22 Potential to Achieve Goals: Good    Frequency Min 3X/week     Co-evaluation PT/OT/SLP Co-Evaluation/Treatment: Yes Reason for Co-Treatment: To address functional/ADL transfers PT goals addressed during session:  Mobility/safety with mobility;Balance;Proper use of DME         AM-PAC PT "6 Clicks" Mobility  Outcome Measure Help needed turning from your back to your side while in a flat bed without using bedrails?: A Lot Help needed moving from lying on your back to sitting on the side of a flat bed without using bedrails?: A Lot Help needed moving to and from a bed to a chair (including a wheelchair)?: A Lot Help needed standing up from a chair using your arms (e.g., wheelchair or bedside chair)?: A Lot Help needed to walk in hospital room?: Total Help needed climbing 3-5 steps with a railing? : Total 6 Click Score: 10    End of Session Equipment Utilized During Treatment: Left knee immobilizer Activity Tolerance: Patient tolerated treatment well;Patient limited by fatigue;Patient limited by pain Patient left: in chair;with call bell/phone within reach;with chair alarm set Nurse Communication: Mobility status PT Visit Diagnosis: Unsteadiness on feet (R26.81);Muscle weakness (generalized) (M62.81);Other abnormalities of gait and mobility (R26.89)    Time: 0822-0852 PT Time Calculation (min) (ACUTE ONLY): 30 min   Charges:   PT Evaluation $PT Eval Moderate Complexity: 1 Mod PT Treatments $Therapeutic Activity: 23-37 mins PT General Charges $$ ACUTE PT VISIT: 1 Visit  10:57 AM, 10/17/22 Ocie Bob, MPT Physical Therapist with [facility] [phone] office [phone] mobile phone

## 2022-10-17 NOTE — Plan of Care (Signed)
  Problem: Acute Rehab PT Goals(only PT should resolve) Goal: Pt Will Go Supine/Side To Sit Outcome: Progressing Flowsheets (Taken 10/17/2022 1059) Pt will go Supine/Side to Sit: with minimal assist Goal: Patient Will Transfer Sit To/From Stand Outcome: Progressing Flowsheets (Taken 10/17/2022 1059) Patient will transfer sit to/from stand:  with minimal assist  with moderate assist Goal: Pt Will Transfer Bed To Chair/Chair To Bed Outcome: Progressing Flowsheets (Taken 10/17/2022 1059) Pt will Transfer Bed to Chair/Chair to Bed:  with min assist  with mod assist   11:00 AM, 10/17/22 Logan Levine, MPT Physical Therapist with [facility] [phone] office [phone] mobile phone

## 2022-10-17 NOTE — Progress Notes (Signed)
Gastroenterology Progress Note   Referring Provider: No ref. provider found Primary Care Physician:  Toma Deiters, MD Primary Gastroenterologist:  Dr. Tasia Catchings (previously Dr. Darrick Penna)  Patient ID: ZEVEN STEWARDSON; [id number]; 1950/07/11    Subjective   Patient reports after waking he was moving wound and touching devices to see what all the equipment was and accidentally pulled out his NGT. He denies any N/V or abdominal pain. States he had a small amount of ice and water to help his dry mouth and he has enjoyed this. No BM but + flatus. Denies shortness of breath.   Objective   Vital signs in last 24 hours Temp:  [98 F (36.7 C)-98.7 F (37.1 C)] 98.3 F (36.8 C) (09/13 0437) Pulse Rate:  [83-99] 94 (09/13 0437) Resp:  [11-23] 18 (09/13 0437) BP: (89-129)/(63-80) 109/80 (09/13 0437) SpO2:  [95 %-100 %] 98 % (09/13 0437) Weight:  [82.4 kg-87 kg] 82.4 kg (09/13 0437) Last BM Date : 10/13/22 (per patient)  Physical Exam General:   Alert and oriented, pleasant Head:  Normocephalic and atraumatic. Eyes:  No icterus, sclera clear. Conjuctiva pink.  Neck:  Supple, without thyromegaly or masses.  Abdomen: Soft, non-tender. Mild distention.  No ttp. Distant tympanic bowel sounds. No HSM or hernias noted. No rebound or guarding. No masses appreciated  Msk:  immobilizer to LLE Extremities:  Without clubbing or edema to RLE Neurologic:  Alert and  oriented x4;  grossly normal neurologically. Skin:  Warm and dry, intact without significant lesions.  Psych:  Alert and cooperative. Normal mood and affect.  Intake/Output from previous day: 09/12 0701 - 09/13 0700 In: 900 [I.V.:400; IV Piggyback:500] Out: 1450 [Urine:700] Intake/Output this shift: No intake/output data recorded.  Lab Results  Recent Labs    10/16/22 1041 10/16/22 1544 10/17/22 0503  WBC 10.2  --  8.4  HGB 10.5* 9.4* 9.5*  HCT 34.0* 30.7* 32.0*  PLT 284  --  286   BMET Recent Labs    10/16/22 1041  10/17/22 0503  NA 131* 133*  K 4.3 4.6  CL 100 107  CO2 22 18*  GLUCOSE 112* 92  BUN 42* 46*  CREATININE 1.25* 1.42*  CALCIUM 8.5* 8.3*   LFT Recent Labs    10/16/22 1041  PROT 8.5*  ALBUMIN 2.7*  AST 84*  ALT 48*  ALKPHOS 92  BILITOT 1.0   PT/INR Recent Labs    10/16/22 1041 10/17/22 0503  LABPROT 15.8* 17.2*  INR 1.2 1.4*   Hepatitis Panel No results for input(s): "HEPBSAG", "HCVAB", "HEPAIGM", "HEPBIGM" in the last 72 hours.  Studies/Results [place] EKG SITE RITE  Result Date: 10/17/2022 If Site Rite image not attached, placement could not be confirmed due to current cardiac rhythm.  CT ABDOMEN PELVIS W CONTRAST  Result Date: 10/16/2022 CLINICAL DATA:  Acute abdominal pain with vomiting for 5 days. Blood in emesis today. EXAM: CT ABDOMEN AND PELVIS WITH CONTRAST TECHNIQUE: Multidetector CT imaging of the abdomen and pelvis was performed using the standard protocol following bolus administration of intravenous contrast. RADIATION DOSE REDUCTION: This exam was performed according to the departmental dose-optimization program which includes automated exposure control, adjustment of the mA and/or kV according to patient size and/or use of iterative reconstruction technique. CONTRAST:  OMNIPAQUE IOHEXOL 300 MG/ML  SOLN COMPARISON:  05/25/2007 FINDINGS: Lower chest: The lung bases are clear. No pleural effusion or consolidative change. There is circumferential wall thickening involving the visualized portions of the distal esophagus. Esophageal varices identified  with hyperenhancement along the wall of the esophagus. Hepatobiliary: The liver has an atrophic appearance with diffusely nodular contour compatible with cirrhosis. Scattered calcified granulomas. No enhancing liver lesions. Cholecystectomy. No bile duct dilatation. Pancreas: Unremarkable. No pancreatic ductal dilatation or surrounding inflammatory changes. Spleen: Normal in size without focal abnormality.  Adrenals/Urinary Tract: Right adrenal nodule measures 1 cm and is unchanged from 2009 compatible with a benign adenoma. No follow-up imaging recommended. Normal left adrenal gland. Two left kidney cysts are identified. The largest arises off the posterior aspect of the left upper pole measuring 6.5 cm. No nephrolithiasis or hydronephrosis. Urinary bladder appears within normal limits. Stomach/Bowel: Moderate distension of the stomach with air-fluid level. High density material layers within the posterior, dependent aspect of the gastric fundus, image 17/3. The proximal small bowel loops are dilated with multiple air-fluid levels measuring up to 4.2 cm. Decreased caliber distal small bowel loops. The transition point is identified within the left hemiabdomen, image 38/3 no abnormal dilatation of the colon. Sigmoid diverticulosis without signs of acute diverticulitis. Vascular/Lymphatic: Aortic atherosclerosis without aneurysm. No signs of abdominal adenopathy or pelvic adenopathy. The upper abdominal vascularity appears patent. Reproductive: Prostate is unremarkable. Other: No ascites or focal fluid collections identified. Fat containing inguinal hernias noted. 2 Musculoskeletal: Multilevel degenerative disc disease identified within the lumbar spine. No acute or suspicious osseous findings. IMPRESSION: 1. Findings are compatible with small bowel obstruction with transition point identified within the left hemiabdomen. 2. Cirrhosis.  No splenomegaly or ascites. 3. Circumferential wall thickening involving the visualized portions of the distal esophagus. Esophageal varices identified with irregular hyperenhancement along the wall of the esophagus. 4. High attenuation material is noted layering within the dependent portion of the gastric fundus. This is indeterminate and may be seen with contrast ingestion. In a patient presenting with bloody emesis (as reported in the clinical history) blood products within the lumen  of the stomach cannot be excluded. 5. Sigmoid diverticulosis without signs of acute diverticulitis. 6. Fat containing inguinal hernias. 7.  Aortic Atherosclerosis (ICD10-I70.0). These results were called by telephone at the time of interpretation on 10/16/2022 at 1:41 pm to provider [facility] , who verbally acknowledged these results. Electronically Signed   By: Signa Kell M.D.   On: 10/16/2022 13:42   DG Chest Port 1 View  Result Date: 10/16/2022 CLINICAL DATA:  Vomiting, upper abdominal pain. EXAM: PORTABLE CHEST 1 VIEW COMPARISON:  Jun 25, 2009. FINDINGS: The heart size and mediastinal contours are within normal limits. Hypoinflation of the lungs. Both lungs are clear. Severe degenerative changes seen involving the left glenohumeral joint. Right-sided PICC line is noted with distal tip in expected position of the SVC. IMPRESSION: Hypoinflation of the lungs.  No acute pulmonary abnormality seen. Electronically Signed   By: Lupita Raider M.D.   On: 10/16/2022 11:46   [place] EKG SITE RITE  Result Date: 10/09/2022 If Site Rite image not attached, placement could not be confirmed due to current cardiac rhythm.  [place] EKG SITE RITE  Result Date: 10/08/2022 If Site Rite image not attached, placement could not be confirmed due to current cardiac rhythm.   Assessment  72 y.o. male with a history of cirrhosis with Hep C/HIV co infection, COPD, GERD, HTN, and HLD who presented to the ED from SNF for 5 day history of N/V and hematemesis. Had recent admission for infected left total knee arthroplasty with antibiotic spacer placement and discharged to SNF for rehab and IV antibiotics and is currently on Eliquis with  last dose heparin 10/15/22. GI consulted for evaluation of hematemesis.   Hematemesis: Presented with several days of N/V and then developed hematemesis and coffee ground emesis noted in the ED. Hgb improved from 8.3 to 10.5 however suspected to be due to dehydration. Presented with BUN 42. EGD  performed yesterday with retained food in the duodenum, excessive gastric fluid, non bleeding grade 2 esophageal varices (not banded), and erosive esophagitis with bleeding.   Small bowel obstruction: CT on admission compatible with SBO. Concern for adhesions as cause. NGT was placed yesterday during EGD and copious amounts of fluid drained. Passing flatus. No BM. Unfortunately NGT was pulled out by patient this morning. Given varices would favor not replacing blindly. Currently doing well without any pain, N/V. Will discuss advancing to clear liquids with surgery.  Cirrhosis: Remote alcohol use. Hs coinfection with Hep C and HIV. Previous Hep C treatment failed. Last seen by our office in 2018 and declined newer treatment options. He follows with ID in [facility] for HIV and has declined treatment with them as well. Discussions with team yesterday he reports he would consider re treatment given oral options and he was encouraged to discuss with his ID provider. Not good candidate for treatment here locally given cirrhosis and HIV coinfection. Interestingly he has had normal platelets and no splenomegaly on imaging. EGD yesterday with non bleeding grade 2 varices (not banded). Will need NSBB on discharge for EV prophylaxis.   MELD 3.0: 17 today  Plan / Recommendations  PPI BID Discussed with Dr. Levon Hedger: Given bleeding from esophagitis and not varices we will stop octreotide and ceftriaxone Given NGT out will discuss with surgery regarding clear liquids Appreciate general surgery recommendations.  Outpatient follow up with ID for Hep C treatment Needs regular outpatient cirrhosis care NSBB on discharge (prefer carvedilol 3.125 BID)    LOS: 0 days   10/17/2022, 9:11 AM  Brooke Bonito, MSN, FNP-BC, AGACNP-BC [facility]

## 2022-10-17 NOTE — Assessment & Plan Note (Signed)
-   Improved nausea vomiting, no signs of active bleeding at this point -Nonbleeding varices were identified on EGD -Initially was on IV octreotide, discontinued by GI

## 2022-10-17 NOTE — Evaluation (Signed)
Occupational Therapy Evaluation Patient Details Name: Logan Levine MRN: [id number] DOB: 06-14-1950 Today's Date: 10/17/2022   History of Present Illness Logan Levine is a 72 year old male with extensive history of chronic appetitive C with liver cirrhosis, HIV, anemia, BPH, chronic bronchitis, COPD, GERD,h/o genital herpes, syphilis, HTN, HLD, recent (10/08/22)  left excisional total knee arthroplasty due to infection-on IV antibiotic and Eliquis.   Currently residing in rehab center on IV antibiotics of vancomycin and Eliquis for 19 days.     Patient will discharge 3 days ago on 10/10/2022 from [facility].       Patient presenting to ED from [facility] rehab with chief complaint of vomiting x 5 today bloody emesis was noted by the staff.  On arrival in ED patient's emesis was clear, normal   Clinical Impression   Pt agreeable to OT/PT evaluation. Pt required mod assist for bed mobility. He reports that was in rehab at a SNF and plans to be d/c to SNF after hospitalization. Pt reports that at baseline he uses a manual w/c for mobility and t/f into it independently. He stated he was previously independent in ADLs and received assistance to get groceries. Pt wearing L knee immobilizer for mobility. He completed stand pivot t/f to chair with mod/max assist to stand with PT blocking knee. Pt would continue to benefit from OT services while in acute care setting.       If plan is discharge home, recommend the following: A lot of help with walking and/or transfers;A lot of help with bathing/dressing/bathroom    Functional Status Assessment  Patient has had a recent decline in their functional status and demonstrates the ability to make significant improvements in function in a reasonable and predictable amount of time.  Equipment Recommendations  None recommended by OT    Recommendations for Other Services       Precautions / Restrictions Precautions Precautions:  Fall Precaution Comments: pt reports he also wears a brace for right knee at baseline "to straighten it" Required Braces or Orthoses: Knee Immobilizer - Left Knee Immobilizer - Right: On when out of bed or walking Restrictions Weight Bearing Restrictions: Yes LLE Weight Bearing: Weight bearing as tolerated      Mobility Bed Mobility Overal bed mobility: Needs Assistance Bed Mobility: Supine to Sit     Supine to sit: Mod assist, Max assist     General bed mobility comments: unable to move LLE due to increased pain    Transfers Overall transfer level: Needs assistance Equipment used: 1 person hand held assist Transfers: Sit to/from Stand, Bed to chair/wheelchair/BSC Sit to Stand: Mod assist Stand pivot transfers: Mod assist, Max assist         General transfer comment: unable to fully extend RLE or use RW during sit to stands due to weakness, required Mod/max assist with right knee blocked to transfer to chair      Balance Overall balance assessment: Needs assistance Sitting-balance support: Feet supported, No upper extremity supported Sitting balance-Leahy Scale: Fair Sitting balance - Comments: fair/good seated at EOB   Standing balance support: During functional activity, Bilateral upper extremity supported Standing balance-Leahy Scale: Poor Standing balance comment: using armrest of chair                           ADL either performed or assessed with clinical judgement   ADL Overall ADL's : Needs assistance/impaired Eating/Feeding:  (PT NPO at this time,  assume eating independetly with set up) Eating/Feeding Details (indicate cue type and reason): PT NPO at this time, assume eating independetly with set up Grooming: Set up;Sitting   Upper Body Bathing: Minimal assistance;Sitting (due to restricted ROM)   Lower Body Bathing: Maximal assistance;Sitting/lateral leans   Upper Body Dressing : Minimal assistance;Sitting   Lower Body Dressing: Moderate  assistance;Sitting/lateral leans   Toilet Transfer: Moderate assistance;BSC/3in1   Toileting- Clothing Manipulation and Hygiene: Maximal assistance;Sitting/lateral lean   Tub/ Shower Transfer: Information systems manager;Moderate assistance   Functional mobility during ADLs: Moderate assistance       Vision Baseline Vision/History: 0 No visual deficits Ability to See in Adequate Light: 0 Adequate                         Pertinent Vitals/Pain Pain Assessment Pain Assessment: Faces Faces Pain Scale: Hurts whole lot Pain Location: Left knee Pain Descriptors / Indicators: Grimacing, Guarding, Aching, Sore Pain Intervention(s): Limited activity within patient's tolerance, Monitored during session     Extremity/Trunk Assessment Upper Extremity Assessment Upper Extremity Assessment: RUE deficits/detail;LUE deficits/detail RUE Deficits / Details: MMT 2+/5 RUE: Shoulder pain with ROM RUE Sensation: WNL RUE Coordination: WNL LUE Deficits / Details: MMT 2+/5 LUE: Shoulder pain with ROM LUE Sensation: WNL LUE Coordination: WNL   Lower Extremity Assessment Lower Extremity Assessment: Defer to PT evaluation   Cervical / Trunk Assessment Cervical / Trunk Assessment: Normal   Communication Communication Communication: No apparent difficulties   Cognition Arousal: Alert Behavior During Therapy: WFL for tasks assessed/performed Overall Cognitive Status: Within Functional Limits for tasks assessed                                                        Home Living Family/patient expects to be discharged to:: Private residence Living Arrangements: Alone Available Help at Discharge: Family;Available PRN/intermittently Type of Home: House Home Access: Ramped entrance     Home Layout: One level           Bathroom Accessibility: Yes How Accessible: Accessible via walker Home Equipment: None          Prior Functioning/Environment Prior Level of Function  : Independent/Modified Independent             Mobility Comments: transfers only, w/c level, uses UEs for propulsion ADLs Comments: bathing at sink        OT Problem List: Decreased strength;Decreased range of motion;Decreased activity tolerance;Impaired balance (sitting and/or standing);Decreased safety awareness      OT Treatment/Interventions: Self-care/ADL training;Therapeutic exercise;Therapeutic activities;DME and/or AE instruction;Energy conservation    OT Goals(Current goals can be found in the care plan section) Acute Rehab OT Goals Patient Stated Goal: return to SNF OT Goal Formulation: With patient Time For Goal Achievement: 10/31/22 Potential to Achieve Goals: Fair ADL Goals Pt Will Perform Grooming: with set-up;sitting Pt Will Perform Upper Body Bathing: Independently;sitting Pt Will Transfer to Toilet: with min assist;stand pivot transfer;bedside commode  OT Frequency: Min 2X/week    Co-evaluation PT/OT/SLP Co-Evaluation/Treatment: Yes Reason for Co-Treatment: To address functional/ADL transfers   OT goals addressed during session: ADL's and self-care      AM-PAC OT "6 Clicks" Daily Activity     Outcome Measure Help from another person eating meals?: A Little Help from another person taking care of personal grooming?: A  Little Help from another person toileting, which includes using toliet, bedpan, or urinal?: A Lot Help from another person bathing (including washing, rinsing, drying)?: A Lot Help from another person to put on and taking off regular upper body clothing?: A Little Help from another person to put on and taking off regular lower body clothing?: A Lot 6 Click Score: 15   End of Session Equipment Utilized During Treatment: Left knee immobilizer Nurse Communication: Mobility status  Activity Tolerance: Patient tolerated treatment well Patient left: in chair;with call bell/phone within reach;with chair alarm set  OT Visit Diagnosis: Muscle  weakness (generalized) (M62.81);Unsteadiness on feet (R26.81)                Time: 4010-2725 OT Time Calculation (min): 42 min Charges:  OT General Charges $OT Visit: 1 Visit OT Evaluation $OT Eval Low Complexity: 1 Low   Bevelyn Ngo, OTR/L  10/17/2022, 2:54 PM

## 2022-10-17 NOTE — Progress Notes (Signed)
After drinking cranberry juice  and 1/2 a cup of water patient's abdomen became more distended. Notified MD. Will continue to monitor.

## 2022-10-17 NOTE — Progress Notes (Signed)
      INFECTIOUS DISEASE ATTENDING ADDENDUM:   Date: 10/17/2022  Patient name: Logan Levine  Medical record number: [id number]  Date  72 year old man with HIV (currently followed at [facility]) on [place] and [place]  He was seen by my partner Dr. Drue Second recently for prosthetic knee infectino sp excision arthroplasty by Dr. Despina Hick with antibiotic spacer having been placed  Corynebacterium form culture and supposed to be on vancomycin though ? Whether this was happening at SNF  His PICC was also in poor condition  He is admitted to [facility] with GIB on prophylactic anticoagulation  Knee is now quite swollen though I suspect he may have also now bled into the knee rather than infection having declared itself again  We plan on reassessing on Monday.  Dr. Luciana Axe is available this weekend for questions should they arise.    Acey Lav 10/17/2022, 7:34 PM

## 2022-10-17 NOTE — Progress Notes (Signed)
Peripherally Inserted Central Catheter Placement  The IV Nurse has discussed with the patient and/or persons authorized to consent for the patient, the purpose of this procedure and the potential benefits and risks involved with this procedure.  The benefits include less needle sticks, lab draws from the catheter, and the patient may be discharged home with the catheter. Risks include, but not limited to, infection, bleeding, blood clot (thrombus formation), and puncture of an artery; nerve damage and irregular heartbeat and possibility to perform a PICC exchange if needed/ordered by physician.  Alternatives to this procedure were also discussed.  Bard Power PICC patient education guide, fact sheet on infection prevention and patient information card has been provided to patient /or left at bedside.    PICC Placement Documentation  PICC Single Lumen 10/17/22 Left Basilic 45 cm 0 cm (Active)  Indication for Insertion or Continuance of Line Prolonged intravenous therapies 10/17/22 1645  Exposed Catheter (cm) 0 cm 10/17/22 1645  Site Assessment Clean, Dry, Intact 10/17/22 1645  Line Status Flushed;Saline locked;Blood return noted 10/17/22 1645  Dressing Type Transparent;Securing device 10/17/22 1645  Dressing Status Antimicrobial disc in place;Clean, Dry, Intact 10/17/22 1645  Line Adjustment (NICU/IV Team Only) No 10/17/22 1645  Dressing Intervention New dressing;Adhesive placed at insertion site (IV team only);Other (Comment) 10/17/22 1645  Dressing Change Due 10/24/22 10/17/22 1645       Reginia Forts Albarece 10/17/2022, 4:46 PM

## 2022-10-18 DIAGNOSIS — K920 Hematemesis: Secondary | ICD-10-CM | POA: Diagnosis not present

## 2022-10-18 DIAGNOSIS — K56609 Unspecified intestinal obstruction, unspecified as to partial versus complete obstruction: Secondary | ICD-10-CM | POA: Diagnosis not present

## 2022-10-18 LAB — CBC
HCT: 27.5 % — ABNORMAL LOW (ref 39.0–52.0)
Hemoglobin: 8.4 g/dL — ABNORMAL LOW (ref 13.0–17.0)
MCH: 25.3 pg — ABNORMAL LOW (ref 26.0–34.0)
MCHC: 30.5 g/dL (ref 30.0–36.0)
MCV: 82.8 fL (ref 80.0–100.0)
Platelets: 243 10*3/uL (ref 150–400)
RBC: 3.32 MIL/uL — ABNORMAL LOW (ref 4.22–5.81)
RDW: 19.8 % — ABNORMAL HIGH (ref 11.5–15.5)
WBC: 9.4 10*3/uL (ref 4.0–10.5)
nRBC: 0 % (ref 0.0–0.2)

## 2022-10-18 LAB — BASIC METABOLIC PANEL
Anion gap: 7 (ref 5–15)
BUN: 41 mg/dL — ABNORMAL HIGH (ref 8–23)
CO2: 18 mmol/L — ABNORMAL LOW (ref 22–32)
Calcium: 8.5 mg/dL — ABNORMAL LOW (ref 8.9–10.3)
Chloride: 108 mmol/L (ref 98–111)
Creatinine, Ser: 1.27 mg/dL — ABNORMAL HIGH (ref 0.61–1.24)
GFR, Estimated: >60 mL/min (ref >60)
Glucose, Bld: 122 mg/dL — ABNORMAL HIGH (ref 70–99)
Potassium: 4.6 mmol/L (ref 3.5–5.1)
Sodium: 133 mmol/L — ABNORMAL LOW (ref 135–145)

## 2022-10-18 LAB — GLUCOSE, CAPILLARY: Glucose-Capillary: 119 mg/dL — ABNORMAL HIGH (ref 70–99)

## 2022-10-18 MED ORDER — PROPRANOLOL HCL ER 80 MG PO CP24: 80.0000 mg | ORAL_CAPSULE | Freq: Every day | ORAL | Status: DC

## 2022-10-18 NOTE — Progress Notes (Signed)
Patient had a medium bowel movement. No nausea or vomiting. Plan of care ongoing.

## 2022-10-18 NOTE — Progress Notes (Signed)
PROGRESS NOTE    Patient: Logan Levine                            PCP: Toma Deiters, MD                    DOB: 1950/07/02            DOA: 10/16/2022 [id number]             DOS: 10/18/2022, 1:02 PM   LOS: 1 day   Date of Service: The patient was seen and examined on 10/18/2022  Subjective:   The patient was seen and examined this morning. Hemodynamically stable. No issues overnight .  Brief Narrative:   Logan Levine is a 72 year old male with extensive history of chronic appetitive C with liver cirrhosis, HIV, anemia, BPH, chronic bronchitis, COPD, GERD,h/o genital herpes, syphilis, HTN, HLD, recent (10/08/22)  left excisional total knee arthroplasty due to infection-on IV antibiotic and Eliquis.  Currently residing in rehab center on IV antibiotics of vancomycin and Eliquis for 19 days.  Patient will discharge 3 days ago on 10/10/2022 from [facility].    Patient presenting to ED from [facility] rehab with chief complaint of vomiting x 5 today bloody emesis was noted by the staff. On arrival in ED patient's emesis was clear, normal  Hemoglobin currently 10.5 (improved from previous admission 8.7, 8.3, now 10.5)  Sodium 131, BUN 42 creatinine 1.25,  CT abdomen/pelvis IMPRESSION: 1. Findings are compatible with small bowel obstruction with transition point identified within the left hemiabdomen. 2. Cirrhosis.  No splenomegaly or ascites. 3. Circumferential wall thickening involving the visualized portions of the distal esophagus. Esophageal varices identified with irregular hyperenhancement along the wall of the esophagus. 4. High attenuation material is noted layering within the dependent portion of the gastric fundus. This is indeterminate and may be seen with contrast ingestion. In a patient presenting with bloody emesis (as reported in the clinical history) blood products within the lumen of the stomach cannot be excluded. 5. Sigmoid  diverticulosis without signs of acute diverticulitis. 6. Fat containing inguinal hernias. 7.  Aortic Atherosclerosis    EDP has consulted and notified gastroenterologist and general surgery--requested patient to be admitted for further evaluation    Assessment & Plan:   Principal Problem:   Gastrointestinal hemorrhage with hematemesis Active Problems:   Small bowel obstruction (HCC)   Infection of prosthetic joint (HCC)   Dementia without behavioral disturbance (HCC)   HIV DISEASE   HYPERTENSION, BENIGN ESSENTIAL   Hepatic cirrhosis due to chronic hepatitis C infection (HCC)   Acute esophagitis   Secondary esophageal varices without bleeding (HCC)   Upper GI bleed     Assessment and Plan: * Gastrointestinal hemorrhage with hematemesis Monitoring, stable status post EGD - GI bleed, likely due to Eliquis which has been started by orthopedic team in the recent discharge for DVT prophylaxis -Patient also has liver cirrhosis due to chronic hepatitis C, and esophageal varices identified on the CT scan -Monitoring H&H closely    Latest Ref Rng & Units 10/18/2022    4:42 AM 10/17/2022    5:03 AM 10/16/2022    3:44 PM  CBC  WBC 4.0 - 10.5 K/uL 9.4  8.4    Hemoglobin 13.0 - 17.0 g/dL 8.4  9.5  9.4   Hematocrit 39.0 - 52.0 % 27.5  32.0  30.7   Platelets 150 - 400  K/uL 243  286     -10/17/2022 s/p EGD findings:Erosive esophagitis with bleeding, grade 2 esophageal varices nonbleeding, retained blood in duodenum   -Monitoring H&H,  -IV Protonix  - IV Octreotide discontinued -Anticipating addition of carvedilol 3.12 mg twice daily  -GI and general surgery consulted appreciated further evaluation and recommendations   Small bowel obstruction (HCC) - Slowly improving,  -Slowly advancing diet per surgery -Patient has removed the NG tube   POA:  Nausea vomiting x 5 today -initially was bloody now clear -If further vomiting will initiate NG tube placement -Finding of SBO per CT  findings  -Continuing IV fluids -General Surgery following -Will monitor closely  Infection of prosthetic joint (HCC) - Followed closely by ID team as an outpatient -Was recently was on Rocephin and vancomycin, antibiotics has been narrowed down to IV vancomycin only -Currently afebrile, normotensive -Patient has a PICC line right arm -was discontinued 10/17/2022 New left arm PICC line was placed 10/17/2022  -Discussed with ID, Dr. Daiva Eves,  recommending continue IV vancomycin Edema likely due to hematoma No signs of reinfection or SIRS/sepsis Will reevaluate Monday  Dementia without behavioral disturbance (HCC) -No behavioral disturbance, currently stable -Not on any medications  Upper GI bleed -10/17/2022 s/p EGD findings:Erosive esophagitis with bleeding, grade 2 esophageal varices nonbleeding, retained blood in duodenum -H&H remained stable, monitoring -Discontinued Eliquis -Continue PPI  Secondary esophageal varices without bleeding (HCC) - Improved nausea vomiting, no signs of active bleeding at this point -Nonbleeding varices were identified on EGD -Initially was on IV octreotide, discontinued by GI   Hepatic cirrhosis due to chronic hepatitis C infection (HCC) History of hepatitis C with hepatic liver cirrhosis, and esophageal varices -Monitoring closely -LFTs mildly elevated, avoiding hepatotoxins    Latest Ref Rng & Units 10/16/2022   10:41 AM 06/11/2022    9:42 AM 10/09/2017    3:02 PM  Hepatic Function  Total Protein 6.5 - 8.1 g/dL 8.5  8.4  7.7   Albumin 3.5 - 5.0 g/dL 2.7  2.9  2.8   AST 15 - 41 U/L 84  54  91   ALT 0 - 44 U/L 48  34  53   Alk Phosphatase 38 - 126 U/L 92  137  162   Total Bilirubin 0.3 - 1.2 mg/dL 1.0  0.4  1.0    -Monitoring  HYPERTENSION, BENIGN ESSENTIAL Stable, resume losartan  -Utilizing as needed hydralazine IV  HIV DISEASE - Stable, ID following as an outpatient closely -Will resume home medication including Raltegravir ,  Emtricitabine-tenofovir          ------------------------------------------------------------------------------------------------------------------------------------------------  DVT prophylaxis:  TED hose Start: 10/16/22 1359 SCDs Start: 10/16/22 1359   Code Status:   Code Status: Full Code  Family Communication: No family member present at bedside- Admission status:   Status is: Inpatient Remains inpatient appropriate because: Continue surveillance for GI bleed, small bowel obstruction, follow-up closely by GI and general surgery..  The antibiotics   Disposition: From  - home             Planning for discharge in 1-2 days: to SNF  Procedures:   No admission procedures for hospital encounter.   Antimicrobials:  Anti-infectives (From admission, onward)    Start     Dose/Rate Route Frequency Ordered Stop   10/18/22 1400  vancomycin (VANCOREADY) IVPB 1250 mg/250 mL        1,250 mg 166.7 mL/hr over 90 Minutes Intravenous Every 24 hours 10/17/22 1330  10/17/22 1415  vancomycin (VANCOREADY) IVPB 1500 mg/300 mL        1,500 mg 150 mL/hr over 120 Minutes Intravenous  Once 10/17/22 1318 10/17/22 1614   10/16/22 1415  cefTRIAXone (ROCEPHIN) 1 g in sodium chloride 0.9 % 100 mL IVPB        1 g 200 mL/hr over 30 Minutes Intravenous  Once 10/16/22 1402 10/16/22 1516        Medication:   bisacodyl  10 mg Rectal BID   Chlorhexidine Gluconate Cloth  6 each Topical Daily   pantoprazole  40 mg Intravenous Q12H   sodium chloride flush  10-40 mL Intracatheter Q12H   sodium chloride flush  3 mL Intravenous Q12H    acetaminophen **OR** acetaminophen, hydrALAZINE, HYDROmorphone (DILAUDID) injection, ipratropium, levalbuterol, ondansetron **OR** ondansetron (ZOFRAN) IV, oxyCODONE, senna-docusate, sodium chloride flush, sodium phosphate, traZODone   Objective:   Vitals:   10/17/22 1209 10/17/22 2124 10/18/22 0438 10/18/22 0500  BP: (!) 99/57 105/69 110/70   Pulse: 93 78 93    Resp: 17 20 20    Temp: 98.7 F (37.1 C) 98.6 F (37 C) 98.3 F (36.8 C)   TempSrc: Oral  Oral   SpO2: 96% 99% 98%   Weight:    81.9 kg  Height:        Intake/Output Summary (Last 24 hours) at 10/18/2022 1302 Last data filed at 10/18/2022 0900 Gross per 24 hour  Intake 120 ml  Output 450 ml  Net -330 ml   Filed Weights   10/16/22 1600 10/17/22 0437 10/18/22 0500  Weight: 87 kg 82.4 kg 81.9 kg     Physical examination:   Constitution:  Alert, cooperative, no distress,  Appears calm and comfortable  Psychiatric:   Normal and stable mood and affect, cognition intact,   HEENT:        Normocephalic, PERRL, otherwise with in Normal limits  Chest:         Chest symmetric Cardio vascular:  S1/S2, RRR, No murmure, No Rubs or Gallops  pulmonary: Clear to auscultation bilaterally, respirations unlabored, negative wheezes / crackles Abdomen: Soft, non-tender, ++ distended, bowel sounds,no masses, no organomegaly Muscular skeletal: Left knee wound, with edema, staples anteriorly dressing in place, immobilizer has been removed  Limited exam - in bed, able to move all 4 extremities, patient's not ambulating due to left knee pain Neuro: CNII-XII intact. , normal motor and sensation, reflexes intact  Extremities: No pitting edema lower extremities, +2 pulses  Skin: Dry, warm to touch, negative for any Rashes, No open wounds Wounds: per nursing documentation   ------------------------------------------------------------------------------------------------------------------------------------------    LABs:     Latest Ref Rng & Units 10/18/2022    4:42 AM 10/17/2022    5:03 AM 10/16/2022    3:44 PM  CBC  WBC 4.0 - 10.5 K/uL 9.4  8.4    Hemoglobin 13.0 - 17.0 g/dL 8.4  9.5  9.4   Hematocrit 39.0 - 52.0 % 27.5  32.0  30.7   Platelets 150 - 400 K/uL 243  286        Latest Ref Rng & Units 10/18/2022    4:42 AM 10/17/2022    5:03 AM 10/16/2022   10:41 AM  CMP  Glucose 70 - 99 mg/dL 440   92  347   BUN 8 - 23 mg/dL 41  46  42   Creatinine 0.61 - 1.24 mg/dL 4.25  9.56  3.87   Sodium 135 - 145 mmol/L 133  133  131  Potassium 3.5 - 5.1 mmol/L 4.6  4.6  4.3   Chloride 98 - 111 mmol/L 108  107  100   CO2 22 - 32 mmol/L 18  18  22    Calcium 8.9 - 10.3 mg/dL 8.5  8.3  8.5   Total Protein 6.5 - 8.1 g/dL   8.5   Total Bilirubin 0.3 - 1.2 mg/dL   1.0   Alkaline Phos 38 - 126 U/L   92   AST 15 - 41 U/L   84   ALT 0 - 44 U/L   48        Micro Results Recent Results (from the past 240 hour(s))  Aerobic/Anaerobic Culture w Gram Stain (surgical/deep wound)     Status: None (Preliminary result)   Collection Time: 10/08/22  3:58 PM   Specimen: Synovial, Left Knee; Body Fluid  Result Value Ref Range Status   Specimen Description   Final    SYNOVIAL LEFT KNEE Performed at [facility], [address] [place], [place] [address]    Special Requests   Final    NONE Performed at [facility], [address], [place], [place] [address]    Gram Stain NO WBC SEEN NO ORGANISMS SEEN   Final   Culture   Final    RARE CORYNEBACTERIUM STRIATUM NO ANAEROBES ISOLATED SUSCEPTIBILITIES TO FOLLOW Sent to [facility] for further susceptibility testing. Performed at [facility], [address], [place], [place] [address]    Report Status PENDING  Incomplete  Susceptibility, Aer + Anaerob     Status: Abnormal   Collection Time: 10/08/22  3:58 PM  Result Value Ref Range Status   Suscept, Aer + Anaerob REFERT (A)  Corrected    Comment: (NOTE) Please refer to the following specimen for additional lab results. Please refer to spec # [id number] for test result. Dorquita Vanhook notified 10-15-2022 at 11:30 AM. Performed At: [facility] [address] [place], [place] [id number] Jolene Schimke MD ZS:[id number] CORRECTED ON 09/11 AT 1136: PREVIOUSLY REPORTED AS Preliminary report    Source of Sample   Final    [id number], Rondel Oh,  VANC, DAPTO, Joesph July, TETIZOLD, MINOCYCLINE, DOXY, OMADOCYCLINE    Comment: Performed at [facility], [address], [place], [place] [address]  Susceptibility Result     Status: Abnormal   Collection Time: 10/08/22  3:58 PM  Result Value Ref Range Status   Suscept Result 1 Comment (A)  Final    Comment: (NOTE) Corynebacterium striatum Identification performed by account, not confirmed by this laboratory. Performed At: [facility] [address] [place], [place] [id number] Jolene Schimke MD [id number]     Radiology Reports No results found.  SIGNED: Kendell Bane, MD, FHM. FAAFP. [facility] - [facility] Time spent - 55 min.  In seeing, evaluating and examining the patient. Reviewing medical records, labs, drawn plan of care. [facility],  Pager (please use [email or web address] to page/ text) Please use Epic Secure Chat for non-urgent communication (7AM-7PM)  If 7PM-7AM, please contact night-coverage [email or web address], 10/18/2022, 1:02 PM

## 2022-10-18 NOTE — Progress Notes (Signed)
Katrinka Blazing, M.D. Gastroenterology & Hepatology   Interval History:  No acute events overnight.  Patient drank some sips yesterday and abdomen is slightly more distended today but he denies having any abdominal pain.  No nausea or vomiting.  States that he was able to move his bowels after receiving a suppository and is passing some gas. Hemoglobin decreased down to 8.4 but no melena or hematochezia was reported.  Inpatient Medications:  Current Facility-Administered Medications:    acetaminophen (TYLENOL) tablet 650 mg, 650 mg, Oral, Q6H PRN **OR** acetaminophen (TYLENOL) suppository 650 mg, 650 mg, Rectal, Q6H PRN, Shahmehdi, Seyed A, MD   bisacodyl (DULCOLAX) suppository 10 mg, 10 mg, Rectal, BID, Lucretia Roers, MD, 10 mg at 10/18/22 1018   Chlorhexidine Gluconate Cloth 2 % PADS 6 each, 6 each, Topical, Daily, Shahmehdi, Seyed A, MD, 6 each at 10/18/22 1023   hydrALAZINE (APRESOLINE) injection 10 mg, 10 mg, Intravenous, Q4H PRN, Shahmehdi, Seyed A, MD   HYDROmorphone (DILAUDID) injection 0.5-1 mg, 0.5-1 mg, Intravenous, Q2H PRN, Shahmehdi, Seyed A, MD, 0.5 mg at 10/18/22 0218   ipratropium (ATROVENT) nebulizer solution 0.5 mg, 0.5 mg, Nebulization, Q6H PRN, Shahmehdi, Seyed A, MD   levalbuterol (XOPENEX) nebulizer solution 0.63 mg, 0.63 mg, Nebulization, Q6H PRN, Shahmehdi, Seyed A, MD   ondansetron (ZOFRAN) tablet 4 mg, 4 mg, Oral, Q6H PRN **OR** ondansetron (ZOFRAN) injection 4 mg, 4 mg, Intravenous, Q6H PRN, Shahmehdi, Seyed A, MD   oxyCODONE (Oxy IR/ROXICODONE) immediate release tablet 5 mg, 5 mg, Oral, Q4H PRN, Shahmehdi, Seyed A, MD   pantoprazole (PROTONIX) injection 40 mg, 40 mg, Intravenous, Q12H, Shahmehdi, Seyed A, MD, 40 mg at 10/18/22 1018   senna-docusate (Senokot-S) tablet 1 tablet, 1 tablet, Oral, QHS PRN, Shahmehdi, Seyed A, MD   sodium chloride flush (NS) 0.9 % injection 10-40 mL, 10-40 mL, Intracatheter, Q12H, Shahmehdi, Seyed A, MD, 10 mL at 10/18/22 1026    sodium chloride flush (NS) 0.9 % injection 10-40 mL, 10-40 mL, Intracatheter, PRN, Shahmehdi, Seyed A, MD   sodium chloride flush (NS) 0.9 % injection 3 mL, 3 mL, Intravenous, Q12H, Shahmehdi, Seyed A, MD, 3 mL at 10/18/22 1026   sodium phosphate (FLEET) enema 1 enema, 1 enema, Rectal, Once PRN, Shahmehdi, Seyed A, MD   traZODone (DESYREL) tablet 25 mg, 25 mg, Oral, QHS PRN, Shahmehdi, Seyed A, MD, 25 mg at 10/17/22 2140   vancomycin (VANCOREADY) IVPB 1250 mg/250 mL, 1,250 mg, Intravenous, Q24H, Jacklynn Barnacle, RPH   I/O    Intake/Output Summary (Last 24 hours) at 10/18/2022 1033 Last data filed at 10/18/2022 0900 Gross per 24 hour  Intake 120 ml  Output 450 ml  Net -330 ml     Physical Exam: Temp:  [98.3 F (36.8 C)-98.7 F (37.1 C)] 98.3 F (36.8 C) (09/14 0438) Pulse Rate:  [78-93] 93 (09/14 0438) Resp:  [17-20] 20 (09/14 0438) BP: (99-110)/(57-70) 110/70 (09/14 0438) SpO2:  [96 %-99 %] 98 % (09/14 0438) Weight:  [81.9 kg] 81.9 kg (09/14 0500)  Temp (24hrs), Avg:98.5 F (36.9 C), Min:98.3 F (36.8 C), Max:98.7 F (37.1 C) GENERAL: The patient is AO x3, in no acute distress. HEENT: Head is normocephalic and atraumatic. EOMI are intact. Mouth is well hydrated and without lesions. NECK: Supple. No masses LUNGS: Clear to auscultation. No presence of rhonchi/wheezing/rales. Adequate chest expansion HEART: RRR, normal s1 and s2. ABDOMEN: nontender, no guarding, no peritoneal signs.  Abdomen is moderately distended but not tense, has presence of scars in the right  side of the abdomen BS +. No masses. EXTREMITIES: Without any cyanosis, clubbing, rash, lesions or edema. NEUROLOGIC: AOx3, no focal motor deficit. SKIN: no jaundice, no rashes  Laboratory Data: CBC:     Component Value Date/Time   WBC 9.4 10/18/2022 0442   RBC 3.32 (L) 10/18/2022 0442   HGB 8.4 (L) 10/18/2022 0442   HCT 27.5 (L) 10/18/2022 0442   PLT 243 10/18/2022 0442   MCV 82.8 10/18/2022 0442   MCH 25.3  (L) 10/18/2022 0442   MCHC 30.5 10/18/2022 0442   RDW 19.8 (H) 10/18/2022 0442   LYMPHSABS 0.8 10/09/2017 1502   MONOABS 1.0 10/09/2017 1502   EOSABS 0.2 10/09/2017 1502   EOSABS 0.1 K/UL 12/04/2005 0924   BASOSABS 0.0 10/09/2017 1502   COAG:  Lab Results  Component Value Date   INR 1.4 (H) 10/17/2022   INR 1.2 10/16/2022   INR 1.23 09/15/2017    BMP:     Latest Ref Rng & Units 10/18/2022    4:42 AM 10/17/2022    5:03 AM 10/16/2022   10:41 AM  BMP  Glucose 70 - 99 mg/dL 086  92  578   BUN 8 - 23 mg/dL 41  46  42   Creatinine 0.61 - 1.24 mg/dL 4.69  6.29  5.28   Sodium 135 - 145 mmol/L 133  133  131   Potassium 3.5 - 5.1 mmol/L 4.6  4.6  4.3   Chloride 98 - 111 mmol/L 108  107  100   CO2 22 - 32 mmol/L 18  18  22    Calcium 8.9 - 10.3 mg/dL 8.5  8.3  8.5     HEPATIC:     Latest Ref Rng & Units 10/16/2022   10:41 AM 06/11/2022    9:42 AM 10/09/2017    3:02 PM  Hepatic Function  Total Protein 6.5 - 8.1 g/dL 8.5  8.4  7.7   Albumin 3.5 - 5.0 g/dL 2.7  2.9  2.8   AST 15 - 41 U/L 84  54  91   ALT 0 - 44 U/L 48  34  53   Alk Phosphatase 38 - 126 U/L 92  137  162   Total Bilirubin 0.3 - 1.2 mg/dL 1.0  0.4  1.0     CARDIAC: No results found for: "CKTOTAL", "CKMB", "CKMBINDEX", "TROPONINI"    Imaging: I personally reviewed and interpreted the available labs, imaging and endoscopic files.   Assessment/Plan: 72 y.o. male with a history of cirrhosis with Hep C/HIV co infection, COPD, GERD, HTN, and HLD, who was admitted to the hospital after presenting persistent nausea and vomiting with episode of hematemesis.  Patient was found to have imaging changes concerning for small bowel obstruction.  Due to concern of upper gastrointestinal bleeding in the setting of history of esophageal varices, he underwent an EGD that showed large amount of food in the stomach and duodenum but no presence of coffee-ground contents or blood.  There was presence of grade 2 esophageal varices without stigmata  of recent bleeding.  Notably, there was presence of grade C erosive esophagitis.  Patient has presented slow improvement of his small bowel obstruction as he has been tolerating some clear liquids and small amounts.  General surgery is currently following him for this.  His likely his SBO is related to previous surgical interventions and adhesions, which is slowly improving.  Will advance diet as tolerated.  He will benefit from ambulating frequently.  In terms of his  gastrointestinal bleeding, this was likely related to esophagitis in the setting of recurrent vomiting.  He should continue with PPI twice daily.  No concern for variceal bleeding.  As his hemoglobin has stabilized and he has not presented any recurrent episodes of GI bleeding, he may start carvedilol 3.125 mg twice daily once ready to be discharged from the hospital.  Will check his MELD labs tomorrow.  - Repeat CBC qday, transfuse if Hb <7 - Daily MELD labs -Continue with sips of clear liquids for now, follow general surgery recommendations regarding advancing diet - Pantoprazole 40 mg q12h IVP - 2 large bore IV lines - Active T/S -Start low-dose carvedilol 3.125 mg twice daily when close to hospital discharge - Avoid NSAIDs  Katrinka Blazing, MD Gastroenterology and Hepatology [facility] Gastroenterology

## 2022-10-18 NOTE — Assessment & Plan Note (Addendum)
-  10/17/2022 s/p EGD findings:Erosive esophagitis with bleeding, grade 2 esophageal varices nonbleeding, retained blood in duodenum -H&H remained stable, monitoring -Discontinued Eliquis -Continue PPI

## 2022-10-18 NOTE — Progress Notes (Signed)
Patient without complaints of abdominal pain, nausea, or vomiting this shift. Active range on motion this shift.

## 2022-10-18 NOTE — Progress Notes (Signed)
Patient complained feeling like "my insides are coming out". Explained to patient that I gave him a scheduled medication to help him have a bowel movement. Expressed understanding. No nausea, or vomiting at this time. Plan of care ongoing.

## 2022-10-18 NOTE — Progress Notes (Signed)
[facility] Progress Note  2 Days Post-Op  Subjective: BM with suppository but still distended. No nausea. Wants the sips of cranberry juice.   Objective: Vital signs in last 24 hours: Temp:  [98.3 F (36.8 C)-98.7 F (37.1 C)] 98.3 F (36.8 C) (09/14 0438) Pulse Rate:  [78-93] 93 (09/14 0438) Resp:  [17-20] 20 (09/14 0438) BP: (99-110)/(57-70) 110/70 (09/14 0438) SpO2:  [96 %-99 %] 98 % (09/14 0438) Weight:  [81.9 kg] 81.9 kg (09/14 0500) Last BM Date : 10/13/22  Intake/Output from previous day: 09/13 0701 - 09/14 0700 In: 120 [P.O.:120] Out: 450 [Urine:450] Intake/Output this shift: No intake/output data recorded.  General appearance: alert and no distress GI: soft,distended, nontender  Lab Results:  Recent Labs    10/17/22 0503 10/18/22 0442  WBC 8.4 9.4  HGB 9.5* 8.4*  HCT 32.0* 27.5*  PLT 286 243   BMET Recent Labs    10/17/22 0503 10/18/22 0442  NA 133* 133*  K 4.6 4.6  CL 107 108  CO2 18* 18*  GLUCOSE 92 122*  BUN 46* 41*  CREATININE 1.42* 1.27*  CALCIUM 8.3* 8.5*   PT/INR Recent Labs    10/16/22 1041 10/17/22 0503  LABPROT 15.8* 17.2*  INR 1.2 1.4*    Studies/Results: DG Abd 1 View  Result Date: 10/17/2022 CLINICAL DATA:  Small bowel obstruction EXAM: ABDOMEN - 1 VIEW COMPARISON:  CT abdomen pelvis, 10/16/2022 FINDINGS: Diffusely gas-filled, although not overtly distended small bowel and colon, with gas and stool present to the rectum. No obvious free air on partial single supine radiograph of the abdomen. IMPRESSION: Diffusely gas-filled, although not overtly distended small bowel and colon, with gas and stool present to the rectum. Electronically Signed   By: Jearld Lesch M.D.   On: 10/17/2022 11:17   [place] EKG SITE RITE  Result Date: 10/17/2022 If Site Rite image not attached, placement could not be confirmed due to current cardiac rhythm.  CT ABDOMEN PELVIS W CONTRAST  Result Date: 10/16/2022 CLINICAL DATA:  Acute  abdominal pain with vomiting for 5 days. Blood in emesis today. EXAM: CT ABDOMEN AND PELVIS WITH CONTRAST TECHNIQUE: Multidetector CT imaging of the abdomen and pelvis was performed using the standard protocol following bolus administration of intravenous contrast. RADIATION DOSE REDUCTION: This exam was performed according to the departmental dose-optimization program which includes automated exposure control, adjustment of the mA and/or kV according to patient size and/or use of iterative reconstruction technique. CONTRAST:  OMNIPAQUE IOHEXOL 300 MG/ML  SOLN COMPARISON:  05/25/2007 FINDINGS: Lower chest: The lung bases are clear. No pleural effusion or consolidative change. There is circumferential wall thickening involving the visualized portions of the distal esophagus. Esophageal varices identified with hyperenhancement along the wall of the esophagus. Hepatobiliary: The liver has an atrophic appearance with diffusely nodular contour compatible with cirrhosis. Scattered calcified granulomas. No enhancing liver lesions. Cholecystectomy. No bile duct dilatation. Pancreas: Unremarkable. No pancreatic ductal dilatation or surrounding inflammatory changes. Spleen: Normal in size without focal abnormality. Adrenals/Urinary Tract: Right adrenal nodule measures 1 cm and is unchanged from 2009 compatible with a benign adenoma. No follow-up imaging recommended. Normal left adrenal gland. Two left kidney cysts are identified. The largest arises off the posterior aspect of the left upper pole measuring 6.5 cm. No nephrolithiasis or hydronephrosis. Urinary bladder appears within normal limits. Stomach/Bowel: Moderate distension of the stomach with air-fluid level. High density material layers within the posterior, dependent aspect of the gastric fundus, image 17/3. The proximal small bowel  loops are dilated with multiple air-fluid levels measuring up to 4.2 cm. Decreased caliber distal small bowel loops. The  transition point is identified within the left hemiabdomen, image 38/3 no abnormal dilatation of the colon. Sigmoid diverticulosis without signs of acute diverticulitis. Vascular/Lymphatic: Aortic atherosclerosis without aneurysm. No signs of abdominal adenopathy or pelvic adenopathy. The upper abdominal vascularity appears patent. Reproductive: Prostate is unremarkable. Other: No ascites or focal fluid collections identified. Fat containing inguinal hernias noted. 2 Musculoskeletal: Multilevel degenerative disc disease identified within the lumbar spine. No acute or suspicious osseous findings. IMPRESSION: 1. Findings are compatible with small bowel obstruction with transition point identified within the left hemiabdomen. 2. Cirrhosis.  No splenomegaly or ascites. 3. Circumferential wall thickening involving the visualized portions of the distal esophagus. Esophageal varices identified with irregular hyperenhancement along the wall of the esophagus. 4. High attenuation material is noted layering within the dependent portion of the gastric fundus. This is indeterminate and may be seen with contrast ingestion. In a patient presenting with bloody emesis (as reported in the clinical history) blood products within the lumen of the stomach cannot be excluded. 5. Sigmoid diverticulosis without signs of acute diverticulitis. 6. Fat containing inguinal hernias. 7.  Aortic Atherosclerosis (ICD10-I70.0). These results were called by telephone at the time of interpretation on 10/16/2022 at 1:41 pm to provider [facility] , who verbally acknowledged these results. Electronically Signed   By: Signa Kell M.D.   On: 10/16/2022 13:42   DG Chest Port 1 View  Result Date: 10/16/2022 CLINICAL DATA:  Vomiting, upper abdominal pain. EXAM: PORTABLE CHEST 1 VIEW COMPARISON:  Jun 25, 2009. FINDINGS: The heart size and mediastinal contours are within normal limits. Hypoinflation of the lungs. Both lungs are clear. Severe  degenerative changes seen involving the left glenohumeral joint. Right-sided PICC line is noted with distal tip in expected position of the SVC. IMPRESSION: Hypoinflation of the lungs.  No acute pulmonary abnormality seen. Electronically Signed   By: Lupita Raider M.D.   On: 10/16/2022 11:46    Anti-infectives: Anti-infectives (From admission, onward)    Start     Dose/Rate Route Frequency Ordered Stop   10/18/22 1400  vancomycin (VANCOREADY) IVPB 1250 mg/250 mL        1,250 mg 166.7 mL/hr over 90 Minutes Intravenous Every 24 hours 10/17/22 1330     10/17/22 1415  vancomycin (VANCOREADY) IVPB 1500 mg/300 mL        1,500 mg 150 mL/hr over 120 Minutes Intravenous  Once 10/17/22 1318 10/17/22 1614   10/16/22 1415  cefTRIAXone (ROCEPHIN) 1 g in sodium chloride 0.9 % 100 mL IVPB        1 g 200 mL/hr over 30 Minutes Intravenous  Once 10/16/22 1402 10/16/22 1516       Assessment/Plan: Patient with SBO, slowly resolving. Doing well. Will go slow. Sips only of water and juice  Suppositories Discussed with Dr. Jola Schmidt    LOS: 1 day    Lucretia Roers 10/18/2022

## 2022-10-19 ENCOUNTER — Telehealth (INDEPENDENT_AMBULATORY_CARE_PROVIDER_SITE_OTHER): Payer: Self-pay | Admitting: Gastroenterology

## 2022-10-19 ENCOUNTER — Inpatient Hospital Stay (HOSPITAL_COMMUNITY): Payer: Medicare Other

## 2022-10-19 DIAGNOSIS — K920 Hematemesis: Secondary | ICD-10-CM | POA: Diagnosis not present

## 2022-10-19 DIAGNOSIS — K56609 Unspecified intestinal obstruction, unspecified as to partial versus complete obstruction: Secondary | ICD-10-CM | POA: Diagnosis not present

## 2022-10-19 DIAGNOSIS — K209 Esophagitis, unspecified without bleeding: Secondary | ICD-10-CM

## 2022-10-19 LAB — CBC
HCT: 28.9 % — ABNORMAL LOW (ref 39.0–52.0)
Hemoglobin: 8.5 g/dL — ABNORMAL LOW (ref 13.0–17.0)
MCH: 24.7 pg — ABNORMAL LOW (ref 26.0–34.0)
MCHC: 29.4 g/dL — ABNORMAL LOW (ref 30.0–36.0)
MCV: 84 fL (ref 80.0–100.0)
Platelets: 221 10*3/uL (ref 150–400)
RBC: 3.44 MIL/uL — ABNORMAL LOW (ref 4.22–5.81)
RDW: 19.5 % — ABNORMAL HIGH (ref 11.5–15.5)
WBC: 8.2 10*3/uL (ref 4.0–10.5)
nRBC: 0.4 % — ABNORMAL HIGH (ref 0.0–0.2)

## 2022-10-19 LAB — HEPATIC FUNCTION PANEL
ALT: 40 U/L (ref 0–44)
AST: 58 U/L — ABNORMAL HIGH (ref 15–41)
Albumin: 2.6 g/dL — ABNORMAL LOW (ref 3.5–5.0)
Alkaline Phosphatase: 77 U/L (ref 38–126)
Bilirubin, Direct: 0.4 mg/dL — ABNORMAL HIGH (ref 0.0–0.2)
Indirect Bilirubin: 0.8 mg/dL (ref 0.3–0.9)
Total Bilirubin: 1.2 mg/dL (ref 0.3–1.2)
Total Protein: 7.3 g/dL (ref 6.5–8.1)

## 2022-10-19 LAB — BASIC METABOLIC PANEL
Anion gap: 7 (ref 5–15)
BUN: 36 mg/dL — ABNORMAL HIGH (ref 8–23)
CO2: 19 mmol/L — ABNORMAL LOW (ref 22–32)
Calcium: 8.4 mg/dL — ABNORMAL LOW (ref 8.9–10.3)
Chloride: 104 mmol/L (ref 98–111)
Creatinine, Ser: 1.14 mg/dL (ref 0.61–1.24)
GFR, Estimated: >60 mL/min (ref >60)
Glucose, Bld: 103 mg/dL — ABNORMAL HIGH (ref 70–99)
Potassium: 4.1 mmol/L (ref 3.5–5.1)
Sodium: 130 mmol/L — ABNORMAL LOW (ref 135–145)

## 2022-10-19 LAB — PROTIME-INR
INR: 1.2 (ref 0.8–1.2)
Prothrombin Time: 15.8 s — ABNORMAL HIGH (ref 11.4–15.2)

## 2022-10-19 LAB — GLUCOSE, CAPILLARY: Glucose-Capillary: 93 mg/dL (ref 70–99)

## 2022-10-19 MED ORDER — CARVEDILOL 3.125 MG PO TABS
3.1250 mg | ORAL_TABLET | Freq: Two times a day (BID) | ORAL | Status: DC
  Administered 2022-10-20 – 2022-10-22 (×5): 3.125 mg via ORAL
  Filled 2022-10-19 (×6): qty 1

## 2022-10-19 MED ORDER — DEXTROSE-NACL 5-0.45 % IV SOLN
INTRAVENOUS | Status: DC
Start: 2022-10-19 — End: 2022-10-19

## 2022-10-19 MED ORDER — SODIUM CHLORIDE 0.9 % IV SOLN: INTRAVENOUS | Status: AC

## 2022-10-19 MED ORDER — RISAQUAD PO CAPS
2.0000 | ORAL_CAPSULE | Freq: Three times a day (TID) | ORAL | Status: DC
  Administered 2022-10-21 – 2022-10-22 (×5): 2 via ORAL
  Filled 2022-10-19 (×6): qty 2

## 2022-10-19 MED ORDER — SENNOSIDES-DOCUSATE SODIUM 8.6-50 MG PO TABS
1.0000 | ORAL_TABLET | Freq: Two times a day (BID) | ORAL | Status: DC
  Administered 2022-10-19 – 2022-10-20 (×4): 1 via ORAL
  Filled 2022-10-19 (×5): qty 1

## 2022-10-19 NOTE — Anesthesia Postprocedure Evaluation (Signed)
Anesthesia Post Note  Patient: Logan Levine  Procedure(s) Performed: ESOPHAGOGASTRODUODENOSCOPY (EGD) WITH PROPOFOL ESOPHAGEAL BANDING  Patient location during evaluation: Phase II Anesthesia Type: General Level of consciousness: awake Pain management: pain level controlled Vital Signs Assessment: post-procedure vital signs reviewed and stable Respiratory status: spontaneous breathing and respiratory function stable Cardiovascular status: blood pressure returned to baseline and stable Postop Assessment: no headache and no apparent nausea or vomiting Anesthetic complications: no Comments: Late entry   No notable events documented.   Last Vitals:  Vitals:   10/18/22 2113 10/19/22 0528  BP: 114/65 114/67  Pulse: 90 89  Resp: 20 20  Temp: 36.7 C 36.6 C  SpO2: 100% 96%    Last Pain:  Vitals:   10/19/22 0800  TempSrc:   PainSc: 3                  Windell Norfolk

## 2022-10-19 NOTE — Progress Notes (Signed)
PROGRESS NOTE    Patient: Logan Levine                            PCP: Toma Deiters, MD                    DOB: February 28, 1950            DOA: 10/16/2022 ZOX:[id number]             DOS: 10/19/2022, 12:48 PM   LOS: 2 days   Date of Service: The patient was seen and examined on 10/19/2022  Subjective:   The patient was seen and examined this morning.  Planning abdominal distention, mild pain Tolerate some liquids overnight now n.p.o. Denies any nausea or vomiting at this point, Small BM x 1   Brief Narrative:   ASSANTE LAPRAIRIE is a 72 year old male with extensive history of chronic appetitive C with liver cirrhosis, HIV, anemia, BPH, chronic bronchitis, COPD, GERD,h/o genital herpes, syphilis, HTN, HLD, recent (10/08/22)  left excisional total knee arthroplasty due to infection-on IV antibiotic and Eliquis.  Currently residing in rehab center on IV antibiotics of vancomycin and Eliquis for 19 days.  Patient will discharge 3 days ago on 10/10/2022 from [facility].    Patient presenting to ED from [facility] with chief complaint of vomiting x 5 today bloody emesis was noted by the staff. On arrival in ED patient's emesis was clear, normal  Hemoglobin currently 10.5 (improved from previous admission 8.7, 8.3, now 10.5)  Sodium 131, BUN 42 creatinine 1.25,  CT abdomen/pelvis IMPRESSION: 1. Findings are compatible with small bowel obstruction with transition point identified within the left hemiabdomen. 2. Cirrhosis.  No splenomegaly or ascites. 3. Circumferential wall thickening involving the visualized portions of the distal esophagus. Esophageal varices identified with irregular hyperenhancement along the wall of the esophagus. 4. High attenuation material is noted layering within the dependent portion of the gastric fundus. This is indeterminate and may be seen with contrast ingestion. In a patient presenting with bloody emesis (as reported in the  clinical history) blood products within the lumen of the stomach cannot be excluded. 5. Sigmoid diverticulosis without signs of acute diverticulitis. 6. Fat containing inguinal hernias. 7.  Aortic Atherosclerosis    EDP has consulted and notified gastroenterologist and general surgery--requested patient to be admitted for further evaluation    Assessment & Plan:   Principal Problem:   Gastrointestinal hemorrhage with hematemesis Active Problems:   Small bowel obstruction (HCC)   Infection of prosthetic joint (HCC)   Dementia without behavioral disturbance (HCC)   HIV DISEASE   HYPERTENSION, BENIGN ESSENTIAL   Hepatic cirrhosis due to chronic hepatitis C infection (HCC)   Acute esophagitis   Secondary esophageal varices without bleeding (HCC)   Upper GI bleed     Assessment and Plan: * Gastrointestinal hemorrhage with hematemesis -No report of active bleeding rectally or with vomitus, BM x 1 nonbloody  Monitoring, stable status post EGD - GI bleed, likely due to Eliquis which has been started by orthopedic team in the recent discharge for DVT prophylaxis -Patient also has liver cirrhosis due to chronic hepatitis C, and esophageal varices identified on the CT scan -Monitoring H&H closely    Latest Ref Rng & Units 10/19/2022    5:10 AM 10/18/2022    4:42 AM 10/17/2022    5:03 AM  CBC  WBC 4.0 - 10.5 K/uL 8.2  9.4  8.4   Hemoglobin 13.0 - 17.0 g/dL 8.5  8.4  9.5   Hematocrit 39.0 - 52.0 % 28.9  27.5  32.0   Platelets 150 - 400 K/uL 221  243  286    -10/17/2022 s/p EGD findings:Erosive esophagitis with bleeding, grade 2 esophageal varices nonbleeding, retained blood in duodenum   -Monitoring H&H,  -IV Protonix  - IV Octreotide discontinued -Anticipating addition of carvedilol 3.12 mg twice daily  -GI and general surgery consulted appreciated further evaluation and recommendations Abdomen remains distended   Small bowel obstruction (HCC) - Per patient nursing BM x  1 -Abdomen remained distended  Reporting no nausea or vomiting at this point -N.p.o. as worsening abdominal distention -NG tube out  POA:  Nausea vomiting x 5 today -initially was bloody now clear -If further vomiting will initiate NG tube placement -Finding of SBO per CT findings  -Will resume IVF -General Surgery following -Will monitor closely  Infection of prosthetic joint (HCC) - Followed closely by ID team as an outpatient -Was recently was on Rocephin and vancomycin, antibiotics has been narrowed down to IV vancomycin only -Currently afebrile, normotensive -Patient has a PICC line right arm -was discontinued 10/17/2022 New left arm PICC line was placed 10/17/2022  -Discussed with ID, Dr. Daiva Eves,  recommending continue IV vancomycin Edema likely due to hematoma No signs of reinfection or SIRS/sepsis Will reevaluate Monday  Dementia without behavioral disturbance (HCC) -No behavioral disturbance, currently stable -Not on any medications  Upper GI bleed -10/17/2022 s/p EGD findings:Erosive esophagitis with bleeding, grade 2 esophageal varices nonbleeding, retained blood in duodenum -H&H remained stable, monitoring -Discontinued Eliquis -Continue PPI  Secondary esophageal varices without bleeding (HCC) - Improved nausea vomiting, no signs of active bleeding at this point -Nonbleeding varices were identified on EGD -Initially was on IV octreotide, discontinued by GI   Acute esophagitis -Continue PPI per GI -  Hepatic cirrhosis due to chronic hepatitis C infection (HCC) -Stable History of hepatitis C with hepatic liver cirrhosis, and esophageal varices -Monitoring closely -LFTs mildly elevated, avoiding hepatotoxins    Latest Ref Rng & Units 10/19/2022    5:10 AM 10/16/2022   10:41 AM 06/11/2022    9:42 AM  Hepatic Function  Total Protein 6.5 - 8.1 g/dL 7.3  8.5  8.4   Albumin 3.5 - 5.0 g/dL 2.6  2.7  2.9   AST 15 - 41 U/L 58  84  54   ALT 0 - 44 U/L 40  48  34    Alk Phosphatase 38 - 126 U/L 77  92  137   Total Bilirubin 0.3 - 1.2 mg/dL 1.2  1.0  0.4   Bilirubin, Direct 0.0 - 0.2 mg/dL 0.4      -Monitoring  HYPERTENSION, BENIGN ESSENTIAL Stable Home medication losartan is on hold  -Utilizing as needed hydralazine IV  HIV DISEASE - Stable, ID following as an outpatient closely -Will resume home medication including Raltegravir , Emtricitabine-tenofovir          ------------------------------------------------------------------------------------------------------------------------------------------------  DVT prophylaxis:  TED hose Start: 10/16/22 1359 SCDs Start: 10/16/22 1359   Code Status:   Code Status: Full Code  Family Communication: No family member present at bedside- Admission status:   Status is: Inpatient Remains inpatient appropriate because: Continue surveillance for GI bleed, small bowel obstruction, follow-up closely by GI and general surgery..  The antibiotics   Disposition: From  - home             Planning  for discharge in 1-2 days: to SNF  Procedures:   No admission procedures for hospital encounter.   Antimicrobials:  Anti-infectives (From admission, onward)    Start     Dose/Rate Route Frequency Ordered Stop   10/18/22 1400  vancomycin (VANCOREADY) IVPB 1250 mg/250 mL        1,250 mg 166.7 mL/hr over 90 Minutes Intravenous Every 24 hours 10/17/22 1330     10/17/22 1415  vancomycin (VANCOREADY) IVPB 1500 mg/300 mL        1,500 mg 150 mL/hr over 120 Minutes Intravenous  Once 10/17/22 1318 10/17/22 1614   10/16/22 1415  cefTRIAXone (ROCEPHIN) 1 g in sodium chloride 0.9 % 100 mL IVPB        1 g 200 mL/hr over 30 Minutes Intravenous  Once 10/16/22 1402 10/16/22 1516        Medication:   acidophilus  2 capsule Oral TID with meals   bisacodyl  10 mg Rectal BID   carvedilol  3.125 mg Oral BID WC   Chlorhexidine Gluconate Cloth  6 each Topical Daily   pantoprazole  40 mg Intravenous Q12H    senna-docusate  1 tablet Oral BID   sodium chloride flush  10-40 mL Intracatheter Q12H   sodium chloride flush  3 mL Intravenous Q12H    acetaminophen **OR** acetaminophen, hydrALAZINE, HYDROmorphone (DILAUDID) injection, ipratropium, levalbuterol, ondansetron **OR** ondansetron (ZOFRAN) IV, oxyCODONE, sodium chloride flush, sodium phosphate, traZODone   Objective:   Vitals:   10/18/22 0500 10/18/22 1527 10/18/22 2113 10/19/22 0528  BP:  106/65 114/65 114/67  Pulse:  94 90 89  Resp:  18 20 20   Temp:  (!) 97.5 F (36.4 C) 98 F (36.7 C) 97.8 F (36.6 C)  TempSrc:  Oral Oral Oral  SpO2:  96% 100% 96%  Weight: 81.9 kg   81.8 kg  Height:        Intake/Output Summary (Last 24 hours) at 10/19/2022 1248 Last data filed at 10/19/2022 0353 Gross per 24 hour  Intake 120 ml  Output 950 ml  Net -830 ml   Filed Weights   10/17/22 0437 10/18/22 0500 10/19/22 0528  Weight: 82.4 kg 81.9 kg 81.8 kg     Physical examination:     General:  AAO x 3,  cooperative, no distress;   HEENT:  Normocephalic, PERRL, otherwise with in Normal limits   Neuro:  CNII-XII intact. , normal motor and sensation, reflexes intact   Lungs:   Clear to auscultation BL, Respirations unlabored,  No wheezes / crackles  Cardio:    S1/S2, RRR, No murmure, No Rubs or Gallops   Abdomen:  Soft, distended diffusely, mildly tender diffusely Hypoactive bowel sounds,  no guarding or peritoneal signs.  Muscular  skeletal:  Left knee anterior wound, immobilized, with edema (postop wound) Limited exam -global generalized weaknesses - in bed, able to move all 4 extremities,   2+ pulses,  symmetric, No pitting edema  Skin:  Dry, warm to touch, negative for any Rashes,  Wounds: Left knee postop wound-dressing in place         ------------------------------------------------------------------------------------------------------------------------------------------    LABs:     Latest Ref Rng & Units 10/19/2022     5:10 AM 10/18/2022    4:42 AM 10/17/2022    5:03 AM  CBC  WBC 4.0 - 10.5 K/uL 8.2  9.4  8.4   Hemoglobin 13.0 - 17.0 g/dL 8.5  8.4  9.5   Hematocrit 39.0 - 52.0 % 28.9  27.5  32.0   Platelets 150 - 400 K/uL 221  243  286       Latest Ref Rng & Units 10/19/2022    5:10 AM 10/18/2022    4:42 AM 10/17/2022    5:03 AM  CMP  Glucose 70 - 99 mg/dL 454  098  92   BUN 8 - 23 mg/dL 36  41  46   Creatinine 0.61 - 1.24 mg/dL 1.19  1.47  8.29   Sodium 135 - 145 mmol/L 130  133  133   Potassium 3.5 - 5.1 mmol/L 4.1  4.6  4.6   Chloride 98 - 111 mmol/L 104  108  107   CO2 22 - 32 mmol/L 19  18  18    Calcium 8.9 - 10.3 mg/dL 8.4  8.5  8.3   Total Protein 6.5 - 8.1 g/dL 7.3     Total Bilirubin 0.3 - 1.2 mg/dL 1.2     Alkaline Phos 38 - 126 U/L 77     AST 15 - 41 U/L 58     ALT 0 - 44 U/L 40          Micro Results No results found for this or any previous visit (from the past 240 hour(s)).   Radiology Reports No results found.  SIGNED: Kendell Bane, MD, FHM. FAAFP. [facility] - [facility] Time spent - 55 min.  In seeing, evaluating and examining the patient. Reviewing medical records, labs, drawn plan of care. [facility],  Pager (please use [email or web address] to page/ text) Please use Epic Secure Chat for non-urgent communication (7AM-7PM)  If 7PM-7AM, please contact night-coverage [email or web address], 10/19/2022, 12:48 PM

## 2022-10-19 NOTE — Telephone Encounter (Signed)
Hi Mitzie/Amanda,  Can you please schedule a follow up appointment for this patient in 3 weeks with any of the APPs (ideally Verlon Au or [place]) or Dr. Tasia Catchings?  Thanks,  Katrinka Blazing, MD Gastroenterology and Hepatology [facility]

## 2022-10-19 NOTE — Progress Notes (Signed)
Katrinka Blazing, M.D. Gastroenterology & Hepatology   Interval History:  No acute vents overnight.  Patient has not vomited since yesterday. Reports feeling well and denies any complaints, states his abdomen is less distended.  Passing some gas.  Inpatient Medications:  Current Facility-Administered Medications:    acetaminophen (TYLENOL) tablet 650 mg, 650 mg, Oral, Q6H PRN **OR** acetaminophen (TYLENOL) suppository 650 mg, 650 mg, Rectal, Q6H PRN, Shahmehdi, Seyed A, MD   acidophilus (RISAQUAD) capsule 2 capsule, 2 capsule, Oral, TID with meals, Shahmehdi, Seyed A, MD   bisacodyl (DULCOLAX) suppository 10 mg, 10 mg, Rectal, BID, Lucretia Roers, MD, 10 mg at 10/19/22 7829   Chlorhexidine Gluconate Cloth 2 % PADS 6 each, 6 each, Topical, Daily, Shahmehdi, Seyed A, MD, 6 each at 10/19/22 5621   hydrALAZINE (APRESOLINE) injection 10 mg, 10 mg, Intravenous, Q4H PRN, Shahmehdi, Seyed A, MD   HYDROmorphone (DILAUDID) injection 0.5-1 mg, 0.5-1 mg, Intravenous, Q2H PRN, Shahmehdi, Seyed A, MD, 0.5 mg at 10/19/22 0338   ipratropium (ATROVENT) nebulizer solution 0.5 mg, 0.5 mg, Nebulization, Q6H PRN, Shahmehdi, Seyed A, MD   levalbuterol (XOPENEX) nebulizer solution 0.63 mg, 0.63 mg, Nebulization, Q6H PRN, Shahmehdi, Seyed A, MD   ondansetron (ZOFRAN) tablet 4 mg, 4 mg, Oral, Q6H PRN **OR** ondansetron (ZOFRAN) injection 4 mg, 4 mg, Intravenous, Q6H PRN, Shahmehdi, Seyed A, MD   oxyCODONE (Oxy IR/ROXICODONE) immediate release tablet 5 mg, 5 mg, Oral, Q4H PRN, Shahmehdi, Seyed A, MD, 5 mg at 10/18/22 2103   pantoprazole (PROTONIX) injection 40 mg, 40 mg, Intravenous, Q12H, Shahmehdi, Seyed A, MD, 40 mg at 10/19/22 0819   senna-docusate (Senokot-S) tablet 1 tablet, 1 tablet, Oral, BID, Shahmehdi, Seyed A, MD, 1 tablet at 10/19/22 1021   sodium chloride flush (NS) 0.9 % injection 10-40 mL, 10-40 mL, Intracatheter, Q12H, Shahmehdi, Seyed A, MD, 10 mL at 10/19/22 0826   sodium chloride flush (NS) 0.9  % injection 10-40 mL, 10-40 mL, Intracatheter, PRN, Shahmehdi, Seyed A, MD   sodium chloride flush (NS) 0.9 % injection 3 mL, 3 mL, Intravenous, Q12H, Shahmehdi, Seyed A, MD, 3 mL at 10/19/22 0827   sodium phosphate (FLEET) enema 1 enema, 1 enema, Rectal, Once PRN, Shahmehdi, Seyed A, MD   traZODone (DESYREL) tablet 25 mg, 25 mg, Oral, QHS PRN, Shahmehdi, Seyed A, MD, 25 mg at 10/17/22 2140   vancomycin (VANCOREADY) IVPB 1250 mg/250 mL, 1,250 mg, Intravenous, Q24H, Jacklynn Barnacle, RPH, Last Rate: 166.7 mL/hr at 10/18/22 1402, 1,250 mg at 10/18/22 1402   I/O    Intake/Output Summary (Last 24 hours) at 10/19/2022 1136 Last data filed at 10/19/2022 0353 Gross per 24 hour  Intake 120 ml  Output 950 ml  Net -830 ml     Physical Exam: Temp:  [97.5 F (36.4 C)-98 F (36.7 C)] 97.8 F (36.6 C) (09/15 0528) Pulse Rate:  [89-94] 89 (09/15 0528) Resp:  [18-20] 20 (09/15 0528) BP: (106-114)/(65-67) 114/67 (09/15 0528) SpO2:  [96 %-100 %] 96 % (09/15 0528) Weight:  [81.8 kg] 81.8 kg (09/15 0528)  Temp (24hrs), Avg:97.8 F (36.6 C), Min:97.5 F (36.4 C), Max:98 F (36.7 C) GENERAL: The patient is AO x3, in no acute distress. HEENT: Head is normocephalic and atraumatic. EOMI are intact. Mouth is well hydrated and without lesions. NECK: Supple. No masses LUNGS: Clear to auscultation. No presence of rhonchi/wheezing/rales. Adequate chest expansion HEART: RRR, normal s1 and s2. ABDOMEN: nontender, no guarding, no peritoneal signs.  Abdomen is mildly distended but not tense, has  presence of scars in the right side of the abdomen BS +. No masses. EXTREMITIES: Without any cyanosis, clubbing, rash, lesions or edema. NEUROLOGIC: AOx3, no focal motor deficit. SKIN: no jaundice, no rashes  Laboratory Data: CBC:     Component Value Date/Time   WBC 8.2 10/19/2022 0510   RBC 3.44 (L) 10/19/2022 0510   HGB 8.5 (L) 10/19/2022 0510   HCT 28.9 (L) 10/19/2022 0510   PLT 221 10/19/2022 0510   MCV  84.0 10/19/2022 0510   MCH 24.7 (L) 10/19/2022 0510   MCHC 29.4 (L) 10/19/2022 0510   RDW 19.5 (H) 10/19/2022 0510   LYMPHSABS 0.8 10/09/2017 1502   MONOABS 1.0 10/09/2017 1502   EOSABS 0.2 10/09/2017 1502   EOSABS 0.1 K/UL 12/04/2005 0924   BASOSABS 0.0 10/09/2017 1502   COAG:  Lab Results  Component Value Date   INR 1.2 10/19/2022   INR 1.4 (H) 10/17/2022   INR 1.2 10/16/2022    BMP:     Latest Ref Rng & Units 10/19/2022    5:10 AM 10/18/2022    4:42 AM 10/17/2022    5:03 AM  BMP  Glucose 70 - 99 mg/dL 409  811  92   BUN 8 - 23 mg/dL 36  41  46   Creatinine 0.61 - 1.24 mg/dL 9.14  7.82  9.56   Sodium 135 - 145 mmol/L 130  133  133   Potassium 3.5 - 5.1 mmol/L 4.1  4.6  4.6   Chloride 98 - 111 mmol/L 104  108  107   CO2 22 - 32 mmol/L 19  18  18    Calcium 8.9 - 10.3 mg/dL 8.4  8.5  8.3     HEPATIC:     Latest Ref Rng & Units 10/19/2022    5:10 AM 10/16/2022   10:41 AM 06/11/2022    9:42 AM  Hepatic Function  Total Protein 6.5 - 8.1 g/dL 7.3  8.5  8.4   Albumin 3.5 - 5.0 g/dL 2.6  2.7  2.9   AST 15 - 41 U/L 58  84  54   ALT 0 - 44 U/L 40  48  34   Alk Phosphatase 38 - 126 U/L 77  92  137   Total Bilirubin 0.3 - 1.2 mg/dL 1.2  1.0  0.4   Bilirubin, Direct 0.0 - 0.2 mg/dL 0.4       CARDIAC: No results found for: "CKTOTAL", "CKMB", "CKMBINDEX", "TROPONINI"    Imaging: I personally reviewed and interpreted the available labs, imaging and endoscopic files.   Assessment/Plan: 72 y.o. male with a history of cirrhosis with Hep C/HIV co infection, COPD, GERD, HTN, and HLD, who was admitted to the hospital after presenting persistent nausea and vomiting with episode of hematemesis.  Patient was found to have imaging changes concerning for small bowel obstruction.  Due to concern of upper gastrointestinal bleeding in the setting of history of esophageal varices, he underwent an EGD that showed large amount of food in the stomach and duodenum but no presence of coffee-ground  contents or blood.  There was presence of grade 2 esophageal varices without stigmata of recent bleeding.  Notably, there was presence of grade C erosive esophagitis.   Patient has presented slow improvement of his small bowel obstruction as he has been tolerating some clear liquids in small amounts.  General surgery is currently following him for this.  Is likely his SBO is related to previous surgical interventions and adhesions, which is  slowly improving.  Will advance diet as tolerated.  He will benefit from ambulating frequently.   In terms of his gastrointestinal bleeding, this was likely related to esophagitis in the setting of recurrent vomiting.  He should continue with PPI twice daily.  No concern for variceal bleeding.  As his hemoglobin has stabilized (in the 8 range) and he has not presented any recurrent episodes of GI bleeding, we will start carvedilol 3.125 mg twice daily today.  MELD today was 17.  If persistently elevated as outpatient, may want to discuss if he is eligible for liver transplant evaluation.   - Repeat CBC qday, transfuse if Hb <7 - Daily MELD labs -Continue with sips of clear liquids for now, follow general surgery recommendations regarding advancing diet - Pantoprazole 40 mg q12h IVP - 2 large bore IV lines - Active T/S -Start low-dose carvedilol 3.125 mg twice daily when close to hospital discharge - Avoid NSAIDs - Patient will follow up in GI clinic with Dr. Tasia Catchings in 3 weeks. - GI service will sign-off, please call us back if you have any more question or if there is any signs of gastrointestinal bleeding   Katrinka Blazing, MD Gastroenterology and Hepatology [facility]

## 2022-10-19 NOTE — Assessment & Plan Note (Signed)
-  Continue PPI per GI -

## 2022-10-19 NOTE — Progress Notes (Signed)
[facility] Progress Note  3 Days Post-Op  Subjective: More distended today. Having small Bms with his suppositories. KUB done and more dilated bowel.   Objective: Vital signs in last 24 hours: Temp:  [97.5 F (36.4 C)-98.4 F (36.9 C)] (P) 98.4 F (36.9 C) (09/15 1354) Pulse Rate:  [89-94] (P) 90 (09/15 1354) Resp:  [18-20] (P) 20 (09/15 1354) BP: (106-114)/(65-67) (P) 119/73 (09/15 1354) SpO2:  [96 %-100 %] (P) 99 % (09/15 1354) Weight:  [81.8 kg] 81.8 kg (09/15 0528) Last BM Date : 10/19/22  Intake/Output from previous day: 09/14 0701 - 09/15 0700 In: 120 [P.O.:120] Out: 950 [Urine:950] Intake/Output this shift: No intake/output data recorded.  General appearance: alert and no distress GI: soft, distended, tender   Lab Results:  Recent Labs    10/18/22 0442 10/19/22 0510  WBC 9.4 8.2  HGB 8.4* 8.5*  HCT 27.5* 28.9*  PLT 243 221   BMET Recent Labs    10/18/22 0442 10/19/22 0510  NA 133* 130*  K 4.6 4.1  CL 108 104  CO2 18* 19*  GLUCOSE 122* 103*  BUN 41* 36*  CREATININE 1.27* 1.14  CALCIUM 8.5* 8.4*   PT/INR Recent Labs    10/17/22 0503 10/19/22 0510  LABPROT 17.2* 15.8*  INR 1.4* 1.2    Studies/Results: DG Abd 1 View  Result Date: 10/19/2022 CLINICAL DATA:  [id number] SBO (small bowel obstruction) (HCC) [id number] EXAM: ABDOMEN - 1 VIEW COMPARISON:  October 17, 2022 FINDINGS: Persistent diffuse gaseous dilation of loops of small bowel, most pronounced in the LEFT upper abdomen. Extent of dilation is overall increased in comparison to prior with representative loop of bowel spanning approximately 3.5 cm. Degenerative changes of the lower lumbar spine. IMPRESSION: Persistent diffuse gaseous dilation of loops of small bowel, most pronounced in the LEFT upper abdomen. Extent of dilation is overall increased in comparison to prior. This is most consistent with persistent small-bowel obstruction. Electronically Signed   By: Meda Klinefelter  M.D.   On: 10/19/2022 13:11    Anti-infectives: Anti-infectives (From admission, onward)    Start     Dose/Rate Route Frequency Ordered Stop   10/18/22 1400  vancomycin (VANCOREADY) IVPB 1250 mg/250 mL        1,250 mg 166.7 mL/hr over 90 Minutes Intravenous Every 24 hours 10/17/22 1330     10/17/22 1415  vancomycin (VANCOREADY) IVPB 1500 mg/300 mL        1,500 mg 150 mL/hr over 120 Minutes Intravenous  Once 10/17/22 1318 10/17/22 1614   10/16/22 1415  cefTRIAXone (ROCEPHIN) 1 g in sodium chloride 0.9 % 100 mL IVPB        1 g 200 mL/hr over 30 Minutes Intravenous  Once 10/16/22 1402 10/16/22 1516       Assessment/Plan: Patient with SBO we thought was improving but now looks worse. NG replaced. Will do SBFT tomorrow.  Child Pugh Score is B and he does have a 30% perioperative mortality if he were to require a surgery.     LOS: 2 days    Lucretia Roers 10/19/2022

## 2022-10-19 NOTE — Plan of Care (Signed)
Problem: Education: Goal: Knowledge of the prescribed therapeutic regimen will improve Outcome: Progressing   Problem: Activity: Goal: Ability to avoid complications of mobility impairment will improve Outcome: Progressing Goal: Range of joint motion will improve Outcome: Progressing   Problem: Clinical Measurements: Goal: Postoperative complications will be avoided or minimized Outcome: Progressing

## 2022-10-20 ENCOUNTER — Inpatient Hospital Stay (HOSPITAL_COMMUNITY): Payer: Medicare Other

## 2022-10-20 ENCOUNTER — Encounter (HOSPITAL_COMMUNITY): Payer: Self-pay | Admitting: Gastroenterology

## 2022-10-20 DIAGNOSIS — K56609 Unspecified intestinal obstruction, unspecified as to partial versus complete obstruction: Secondary | ICD-10-CM

## 2022-10-20 LAB — CBC
HCT: 27.8 % — ABNORMAL LOW (ref 39.0–52.0)
Hemoglobin: 8.5 g/dL — ABNORMAL LOW (ref 13.0–17.0)
MCH: 25.3 pg — ABNORMAL LOW (ref 26.0–34.0)
MCHC: 30.6 g/dL (ref 30.0–36.0)
MCV: 82.7 fL (ref 80.0–100.0)
Platelets: 207 10*3/uL (ref 150–400)
RBC: 3.36 MIL/uL — ABNORMAL LOW (ref 4.22–5.81)
RDW: 19.9 % — ABNORMAL HIGH (ref 11.5–15.5)
WBC: 8.9 10*3/uL (ref 4.0–10.5)
nRBC: 0 % (ref 0.0–0.2)

## 2022-10-20 LAB — BASIC METABOLIC PANEL
Anion gap: 8 (ref 5–15)
Anion gap: 7 (ref 5–15)
BUN: 35 mg/dL — ABNORMAL HIGH (ref 8–23)
BUN: 29 mg/dL — ABNORMAL HIGH (ref 8–23)
CO2: 18 mmol/L — ABNORMAL LOW (ref 22–32)
CO2: 18 mmol/L — ABNORMAL LOW (ref 22–32)
Calcium: 8.6 mg/dL — ABNORMAL LOW (ref 8.9–10.3)
Calcium: 8.3 mg/dL — ABNORMAL LOW (ref 8.9–10.3)
Chloride: 104 mmol/L (ref 98–111)
Chloride: 106 mmol/L (ref 98–111)
Creatinine, Ser: 1.3 mg/dL — ABNORMAL HIGH (ref 0.61–1.24)
Creatinine, Ser: 1 mg/dL (ref 0.61–1.24)
GFR, Estimated: 58 mL/min — ABNORMAL LOW (ref >60)
GFR, Estimated: >60 mL/min (ref >60)
Glucose, Bld: 92 mg/dL (ref 70–99)
Glucose, Bld: 86 mg/dL (ref 70–99)
Potassium: 4.3 mmol/L (ref 3.5–5.1)
Potassium: 4.2 mmol/L (ref 3.5–5.1)
Sodium: 130 mmol/L — ABNORMAL LOW (ref 135–145)
Sodium: 131 mmol/L — ABNORMAL LOW (ref 135–145)

## 2022-10-20 LAB — VANCOMYCIN, PEAK: Vancomycin Pk: 27 ug/mL — ABNORMAL LOW (ref 30–40)

## 2022-10-20 LAB — AFP TUMOR MARKER: AFP, Serum, Tumor Marker: 10.2 ng/mL — ABNORMAL HIGH (ref 0.0–8.4)

## 2022-10-20 LAB — GLUCOSE, CAPILLARY: Glucose-Capillary: 83 mg/dL (ref 70–99)

## 2022-10-20 MED ORDER — SODIUM CHLORIDE 0.9 % IV SOLN: INTRAVENOUS | Status: AC

## 2022-10-20 MED ORDER — DIATRIZOATE MEGLUMINE & SODIUM 66-10 % PO SOLN
90.0000 mL | Freq: Once | ORAL | Status: AC
  Administered 2022-10-20: 90 mL via NASOGASTRIC
  Filled 2022-10-20: qty 90

## 2022-10-20 NOTE — Progress Notes (Signed)
Pt had large bowel movement. MD made aware.

## 2022-10-20 NOTE — Progress Notes (Signed)
[facility]  SBFT with contrast to the colon. Will give clear diet.   Algis Greenhouse, MD [facility] [address] [place], [place] [address] [phone] (office)

## 2022-10-20 NOTE — Progress Notes (Signed)
Patient removed NG tube, states "it was uncomfortable and I didn't want it in" MD notified

## 2022-10-20 NOTE — Progress Notes (Signed)
Pharmacy Antibiotic Note  LEANDRO HULBURT is a 72 y.o. male admitted on 10/16/2022 with {Indications:3041527}.  Pharmacy has been consulted for *** dosing.  Plan: {Assessment:21075}  Height: 5\' 8"  (172.7 cm) Weight: 85.9 kg (189 lb 6 oz) IBW/kg (Calculated) : 68.4  Temp (24hrs), Avg:98.3 F (36.8 C), Min:97.8 F (36.6 C), Max:98.6 F (37 C)  Recent Labs  Lab 10/16/22 1041 10/17/22 0503 10/17/22 0901 10/18/22 0442 10/19/22 0510 10/20/22 0458  WBC 10.2 8.4  --  9.4 8.2 8.9  CREATININE 1.25* 1.42*  --  1.27* 1.14 1.30*  LATICACIDVEN 1.3  --   --   --   --   --   VANCORANDOM  --   --  <4  --   --   --     Estimated Creatinine Clearance: 54.8 mL/min (A) (by C-G formula based on SCr of 1.3 mg/dL (H)).    Allergies  Allergen Reactions   Morphine Other (See Comments)    No reaction listed on MAR   Nsaids Other (See Comments)    No reaction listed on MAR    Antimicrobials this admission: *** *** >> *** *** *** >> ***  Dose adjustments this admission: ***  Microbiology results: *** BCx: *** *** UCx: ***  *** Sputum: ***  *** MRSA PCR: ***  Thank you for allowing pharmacy to be a part of this patient's care.  Rolley Sims 10/20/2022 1:42 PM

## 2022-10-20 NOTE — Progress Notes (Signed)
Pharmacy Antibiotic Note  Logan Levine is a 72 y.o. male admitted on 10/16/2022 with  GI bleed and prosthetic joint infection .  Pharmacy has been consulted for vancomycin dosing.  Previously discharged 9/7 with OPAT orders to receive vancomycin 1250 mg IV every 24 hours and ceftriaxone 2 g IV every 24 hours for recurrent prosthetic joint infection with historical EOT 10/16. Cultures resulted after DC with corynebacterium striatum and was deescalated to vancomycin only. Last dose of vancomycin was reported as 9/8, unclear if was receiving outpatient. Vancomycin random level was undetectable. Will reevaluate need to extend therapy given possibility of gap in treatment.  Patient currently afebrile, wbc 8. Scr appears stable at 1.3. No new cultures.   Plan: Vancomycin 1250 mg IV every 24 hours.  Goal trough 10-15 mcg/mL. Recalculated eAUC 500 with Scr 1.42 Vancomycin peak and trough levels ordered for 9/16 and 9/17 respectively  Height: 5\' 8"  (172.7 cm) Weight: 85.9 kg (189 lb 6 oz) IBW/kg (Calculated) : 68.4  Temp (24hrs), Avg:98.3 F (36.8 C), Min:97.8 F (36.6 C), Max:98.6 F (37 C)  Recent Labs  Lab 10/16/22 1041 10/17/22 0503 10/17/22 0901 10/18/22 0442 10/19/22 0510 10/20/22 0458  WBC 10.2 8.4  --  9.4 8.2 8.9  CREATININE 1.25* 1.42*  --  1.27* 1.14 1.30*  LATICACIDVEN 1.3  --   --   --   --   --   VANCORANDOM  --   --  <4  --   --   --     Estimated Creatinine Clearance: 54.8 mL/min (A) (by C-G formula based on SCr of 1.3 mg/dL (H)).    Allergies  Allergen Reactions   Morphine Other (See Comments)    No reaction listed on MAR   Nsaids Other (See Comments)    No reaction listed on MAR    Antimicrobials this admission: CTX x1 Vancomycin 9/13>>   Thank you for allowing pharmacy to be a part of this patient's care.  Sheppard Coil PharmD., BCPS Clinical Pharmacist 10/20/2022 12:43 PM

## 2022-10-20 NOTE — Progress Notes (Addendum)
[facility]  Patient keeps pulling his NG. Was going to do SBFT this AM. Will have him drink the contrast and do KUB at 8 hour delay.   Discussed with RN and radiology.   BP (!) 143/80 (BP Location: Right Arm)   Pulse 96   Temp 97.8 F (36.6 C)   Resp 17   Ht 5\' 8"  (1.727 m)   Wt 85.9 kg   SpO2 100%   BMI 28.79 kg/m  Soft, less distended, nontender  Patient with SBO. SBFT now and will get KUB at 610pm.  Algis Greenhouse, MD [facility] [address] [place], [place] [address] [phone] (office)

## 2022-10-20 NOTE — Plan of Care (Signed)
  Problem: Pain Management: Goal: Pain level will decrease with appropriate interventions Outcome: Not Progressing   Problem: Skin Integrity: Goal: Will show signs of wound healing Outcome: Not Progressing   Problem: Clinical Measurements: Goal: Will remain free from infection Outcome: Not Progressing   Problem: Activity: Goal: Risk for activity intolerance will decrease Outcome: Not Progressing   Problem: Elimination: Goal: Will not experience complications related to bowel motility Outcome: Not Progressing

## 2022-10-20 NOTE — Care Management Important Message (Signed)
Important Message  Patient Details  Name: Logan Levine MRN: [id number] Date of Birth: [birth year]   Medicare Important Message Given:  Yes     [name] 10/20/2022, 2:18 PM

## 2022-10-20 NOTE — Progress Notes (Signed)
PROGRESS NOTE    Patient: Logan Levine                            PCP: Toma Deiters, MD                    DOB: 01-17-1951            DOA: 10/16/2022 WGN:[id number]             DOS: 10/20/2022, 11:12 AM   LOS: 3 days   Date of Service: The patient was seen and examined on 10/20/2022  Subjective:   The patient was seen and examined this morning, annoyed with NG tube, pulled out again Stating is not tolerable, uncomfortable Reporting a nonbloody bowel movement x 2  Brief Narrative:   Logan Levine is a 72 year old male with extensive history of chronic appetitive C with liver cirrhosis, HIV, anemia, BPH, chronic bronchitis, COPD, GERD,h/o genital herpes, syphilis, HTN, HLD, recent (10/08/22)  left excisional total knee arthroplasty due to infection-on IV antibiotic and Eliquis.  Currently residing in rehab center on IV antibiotics of vancomycin and Eliquis for 19 days.  Patient will discharge 3 days ago on 10/10/2022 from [facility].    Patient presenting to ED from [facility] brain center rehab with chief complaint of vomiting x 5 today bloody emesis was noted by the staff. On arrival in ED patient's emesis was clear, normal  Hemoglobin currently 10.5 (improved from previous admission 8.7, 8.3, now 10.5)  Sodium 131, BUN 42 creatinine 1.25,  CT abdomen/pelvis IMPRESSION: 1. Findings are compatible with small bowel obstruction with transition point identified within the left hemiabdomen. 2. Cirrhosis.  No splenomegaly or ascites. 3. Circumferential wall thickening involving the visualized portions of the distal esophagus. Esophageal varices identified with irregular hyperenhancement along the wall of the esophagus. 4. High attenuation material is noted layering within the dependent portion of the gastric fundus. This is indeterminate and may be seen with contrast ingestion. In a patient presenting with bloody emesis (as reported in the clinical history) blood  products within the lumen of the stomach cannot be excluded. 5. Sigmoid diverticulosis without signs of acute diverticulitis. 6. Fat containing inguinal hernias. 7.  Aortic Atherosclerosis    EDP has consulted and notified gastroenterologist and general surgery--requested patient to be admitted for further evaluation    Assessment & Plan:   Principal Problem:   Small bowel obstruction (HCC) Active Problems:   Gastrointestinal hemorrhage with hematemesis   Infection of prosthetic joint (HCC)   Dementia without behavioral disturbance (HCC)   HIV DISEASE   HYPERTENSION, BENIGN ESSENTIAL   Hepatic cirrhosis due to chronic hepatitis C infection (HCC)   Acute esophagitis   Secondary esophageal varices without bleeding (HCC)   Upper GI bleed     Assessment and Plan: * Small bowel obstruction (HCC) -Patient is pulling out his NG tube again this morning -Per patient and records BM x 2 nonbloody or melena  Abdomen remains distended, but not tender -Reporting of no nausea vomiting    POA:  Nausea vomiting x 5 today -initially was bloody now clear -If further vomiting will initiate NG tube placement -Finding of SBO per CT findings  -Will resume IVF -General Surgery following -Will monitor closely  -Try to have the patient drink the contrast, KUB in 8 hours  Gastrointestinal hemorrhage with hematemesis -Bowel movement x 2, reporting nonbloody  Monitoring, stable status post EGD -  GI bleed, likely due to Eliquis which has been started by orthopedic team in the recent discharge for DVT prophylaxis -Patient also has liver cirrhosis due to chronic hepatitis C, and esophageal varices identified on the CT scan -Monitoring H&H closely    Latest Ref Rng & Units 10/20/2022    4:58 AM 10/19/2022    5:10 AM 10/18/2022    4:42 AM  CBC  WBC 4.0 - 10.5 K/uL 8.9  8.2  9.4   Hemoglobin 13.0 - 17.0 g/dL 8.5  8.5  8.4   Hematocrit 39.0 - 52.0 % 27.8  28.9  27.5   Platelets 150 - 400  K/uL 207  221  243    -10/17/2022 s/p EGD findings:Erosive esophagitis with bleeding, grade 2 esophageal varices nonbleeding, retained blood in duodenum   -Monitoring H&H,  -IV Protonix  - IV Octreotide discontinued -Started on Carvedilol 3.12 mg twice daily  -GI and general surgery consulted appreciated further evaluation and recommendations Abdomen remains distended   Infection of prosthetic joint (HCC) - Left knee remains edematous, nontender, with no erythema Afebrile, normotensive Wound clean, staples in place-dressing in place  - Followed closely by ID team as an outpatient -Was recently was on Rocephin and vancomycin, antibiotics has been narrowed down to IV vancomycin only  -Patient has a PICC line right arm -was discontinued 10/17/2022 New left arm PICC line was placed 10/17/2022  -Discussed with ID, Dr. Daiva Eves,  recommending continue IV vancomycin Edema likely due to hematoma No signs of reinfection or SIRS/sepsis   Dementia without behavioral disturbance (HCC) -No behavioral disturbance, currently stable -Not on any medications  Upper GI bleed -10/17/2022 s/p EGD findings:Erosive esophagitis with bleeding, grade 2 esophageal varices nonbleeding, retained blood in duodenum -H&H remained stable, monitoring -Discontinued Eliquis -Continue PPI  Secondary esophageal varices without bleeding (HCC) - Improved nausea vomiting, no signs of active bleeding at this point -Nonbleeding varices were identified on EGD -Initially was on IV octreotide, discontinued by GI   Acute esophagitis -Continue PPI per GI -  Hepatic cirrhosis due to chronic hepatitis C infection (HCC) -Stable History of hepatitis C with hepatic liver cirrhosis, and esophageal varices -Monitoring closely -LFTs mildly elevated, avoiding hepatotoxins    Latest Ref Rng & Units 10/19/2022    5:10 AM 10/16/2022   10:41 AM 06/11/2022    9:42 AM  Hepatic Function  Total Protein 6.5 - 8.1 g/dL 7.3  8.5   8.4   Albumin 3.5 - 5.0 g/dL 2.6  2.7  2.9   AST 15 - 41 U/L 58  84  54   ALT 0 - 44 U/L 40  48  34   Alk Phosphatase 38 - 126 U/L 77  92  137   Total Bilirubin 0.3 - 1.2 mg/dL 1.2  1.0  0.4   Bilirubin, Direct 0.0 - 0.2 mg/dL 0.4      -Monitoring  HYPERTENSION, BENIGN ESSENTIAL BP stable   Home medication losartan is on hold  -Utilizing as needed hydralazine IV  HIV DISEASE - Stable, ID following as an outpatient closely -Will resume home medication including Raltegravir , Emtricitabine-tenofovir          ------------------------------------------------------------------------------------------------------------------------------------------------  DVT prophylaxis:  TED hose Start: 10/16/22 1359 SCDs Start: 10/16/22 1359   Code Status:   Code Status: Full Code  Family Communication: No family member present at bedside- Admission status:   Status is: Inpatient Remains inpatient appropriate because: Continue surveillance for GI bleed, small bowel obstruction, follow-up closely by GI and  general surgery..  The antibiotics   Disposition: From  - home             Planning for discharge in 1-2 days: to SNF  Procedures:   No admission procedures for hospital encounter.   Antimicrobials:  Anti-infectives (From admission, onward)    Start     Dose/Rate Route Frequency Ordered Stop   10/18/22 1400  vancomycin (VANCOREADY) IVPB 1250 mg/250 mL        1,250 mg 166.7 mL/hr over 90 Minutes Intravenous Every 24 hours 10/17/22 1330     10/17/22 1415  vancomycin (VANCOREADY) IVPB 1500 mg/300 mL        1,500 mg 150 mL/hr over 120 Minutes Intravenous  Once 10/17/22 1318 10/17/22 1614   10/16/22 1415  cefTRIAXone (ROCEPHIN) 1 g in sodium chloride 0.9 % 100 mL IVPB        1 g 200 mL/hr over 30 Minutes Intravenous  Once 10/16/22 1402 10/16/22 1516        Medication:   acidophilus  2 capsule Oral TID with meals   bisacodyl  10 mg Rectal BID   carvedilol  3.125 mg Oral  BID WC   Chlorhexidine Gluconate Cloth  6 each Topical Daily   pantoprazole  40 mg Intravenous Q12H   senna-docusate  1 tablet Oral BID   sodium chloride flush  10-40 mL Intracatheter Q12H    acetaminophen **OR** acetaminophen, hydrALAZINE, HYDROmorphone (DILAUDID) injection, ipratropium, levalbuterol, ondansetron **OR** ondansetron (ZOFRAN) IV, oxyCODONE, sodium chloride flush, sodium phosphate, traZODone   Objective:   Vitals:   10/19/22 1354 10/19/22 2132 10/20/22 0439 10/20/22 0500  BP: 119/73 114/75 115/77   Pulse: 90 94 96   Resp: 20 20 18    Temp: 98.4 F (36.9 C) 98.6 F (37 C) 98.4 F (36.9 C)   TempSrc: Oral     SpO2: 99% 100% 100%   Weight:    85.9 kg  Height:        Intake/Output Summary (Last 24 hours) at 10/20/2022 1112 Last data filed at 10/20/2022 0511 Gross per 24 hour  Intake 976.82 ml  Output 1140 ml  Net -163.18 ml   Filed Weights   10/18/22 0500 10/19/22 0528 10/20/22 0500  Weight: 81.9 kg 81.8 kg 85.9 kg     Physical examination:         General:  AAO x 3,  cooperative, no distress;   HEENT:  Normocephalic, PERRL, otherwise with in Normal limits   Neuro:  CNII-XII intact. , normal motor and sensation, reflexes intact   Lungs:   Clear to auscultation BL, Respirations unlabored,  No wheezes / crackles  Cardio:    S1/S2, RRR, No murmure, No Rubs or Gallops   Abdomen:  Soft, distended mild diffuse tenderness, hypoactive bowel sounds no guarding or peritoneal signs.  Muscular  skeletal:  Left knee edema, inability to mobilize and move the knee postop wound, staples in place dressing in place-nontender -global generalized weaknesses - in bed, able to move all 4 extremities,   2+ pulses,  symmetric, No pitting edema  Skin:  Dry, warm to touch, negative for any Rashes,  Wounds: Please see nursing documentation             ------------------------------------------------------------------------------------------------------------------------------------------    LABs:     Latest Ref Rng & Units 10/20/2022    4:58 AM 10/19/2022    5:10 AM 10/18/2022    4:42 AM  CBC  WBC 4.0 - 10.5 K/uL 8.9  8.2  9.4  Hemoglobin 13.0 - 17.0 g/dL 8.5  8.5  8.4   Hematocrit 39.0 - 52.0 % 27.8  28.9  27.5   Platelets 150 - 400 K/uL 207  221  243       Latest Ref Rng & Units 10/20/2022    4:58 AM 10/19/2022    5:10 AM 10/18/2022    4:42 AM  CMP  Glucose 70 - 99 mg/dL 92  742  595   BUN 8 - 23 mg/dL 35  36  41   Creatinine 0.61 - 1.24 mg/dL 6.38  7.56  4.33   Sodium 135 - 145 mmol/L 130  130  133   Potassium 3.5 - 5.1 mmol/L 4.3  4.1  4.6   Chloride 98 - 111 mmol/L 104  104  108   CO2 22 - 32 mmol/L 18  19  18    Calcium 8.9 - 10.3 mg/dL 8.6  8.4  8.5   Total Protein 6.5 - 8.1 g/dL  7.3    Total Bilirubin 0.3 - 1.2 mg/dL  1.2    Alkaline Phos 38 - 126 U/L  77    AST 15 - 41 U/L  58    ALT 0 - 44 U/L  40         Micro Results No results found for this or any previous visit (from the past 240 hour(s)).   Radiology Reports DG CHEST PORT 1 VIEW  Result Date: 10/19/2022 CLINICAL DATA:  Enteric tube placement. EXAM: PORTABLE CHEST 1 VIEW COMPARISON:  Abdominal radiograph performed earlier today. FINDINGS: An enteric tube terminates in the stomach. IMPRESSION: Enteric tube terminates in the stomach. Electronically Signed   By: Romona Curls M.D.   On: 10/19/2022 15:43   DG Abd 1 View  Result Date: 10/19/2022 CLINICAL DATA:  [id number] SBO (small bowel obstruction) (HCC) [id number] EXAM: ABDOMEN - 1 VIEW COMPARISON:  October 17, 2022 FINDINGS: Persistent diffuse gaseous dilation of loops of small bowel, most pronounced in the LEFT upper abdomen. Extent of dilation is overall increased in comparison to prior with representative loop of bowel spanning approximately 3.5 cm. Degenerative changes of the lower lumbar spine.  IMPRESSION: Persistent diffuse gaseous dilation of loops of small bowel, most pronounced in the LEFT upper abdomen. Extent of dilation is overall increased in comparison to prior. This is most consistent with persistent small-bowel obstruction. Electronically Signed   By: Meda Klinefelter M.D.   On: 10/19/2022 13:11    SIGNED: Kendell Bane, MD, FHM. FAAFP. [facility] - [facility] Time spent - 55 min.  In seeing, evaluating and examining the patient. Reviewing medical records, labs, drawn plan of care. [facility],  Pager (please use [email or web address] to page/ text) Please use Epic Secure Chat for non-urgent communication (7AM-7PM)  If 7PM-7AM, please contact night-coverage [email or web address], 10/20/2022, 11:12 AM

## 2022-10-21 ENCOUNTER — Other Ambulatory Visit (HOSPITAL_COMMUNITY): Payer: Self-pay

## 2022-10-21 DIAGNOSIS — Z96652 Presence of left artificial knee joint: Secondary | ICD-10-CM

## 2022-10-21 DIAGNOSIS — K56609 Unspecified intestinal obstruction, unspecified as to partial versus complete obstruction: Secondary | ICD-10-CM | POA: Diagnosis not present

## 2022-10-21 LAB — MISC LABCORP TEST (SEND OUT): Labcorp test code: 88021

## 2022-10-21 LAB — BASIC METABOLIC PANEL
Anion gap: 6 (ref 5–15)
BUN: 26 mg/dL — ABNORMAL HIGH (ref 8–23)
CO2: 18 mmol/L — ABNORMAL LOW (ref 22–32)
Calcium: 8.3 mg/dL — ABNORMAL LOW (ref 8.9–10.3)
Chloride: 108 mmol/L (ref 98–111)
Creatinine, Ser: 0.93 mg/dL (ref 0.61–1.24)
GFR, Estimated: >60 mL/min (ref >60)
Glucose, Bld: 79 mg/dL (ref 70–99)
Potassium: 4.1 mmol/L (ref 3.5–5.1)
Sodium: 132 mmol/L — ABNORMAL LOW (ref 135–145)

## 2022-10-21 LAB — CBC
HCT: 26.4 % — ABNORMAL LOW (ref 39.0–52.0)
Hemoglobin: 8.2 g/dL — ABNORMAL LOW (ref 13.0–17.0)
MCH: 25.3 pg — ABNORMAL LOW (ref 26.0–34.0)
MCHC: 31.1 g/dL (ref 30.0–36.0)
MCV: 81.5 fL (ref 80.0–100.0)
Platelets: 177 10*3/uL (ref 150–400)
RBC: 3.24 MIL/uL — ABNORMAL LOW (ref 4.22–5.81)
RDW: 19.8 % — ABNORMAL HIGH (ref 11.5–15.5)
WBC: 5.8 10*3/uL (ref 4.0–10.5)
nRBC: 0 % (ref 0.0–0.2)

## 2022-10-21 LAB — GLUCOSE, CAPILLARY: Glucose-Capillary: 77 mg/dL (ref 70–99)

## 2022-10-21 LAB — SEDIMENTATION RATE: Sed Rate: 38 mm/h — ABNORMAL HIGH (ref 0–16)

## 2022-10-21 LAB — C-REACTIVE PROTEIN: CRP: 0.9 mg/dL (ref <1.0)

## 2022-10-21 LAB — SUSCEPTIBILITY RESULT

## 2022-10-21 LAB — VANCOMYCIN, RANDOM: Vancomycin Rm: 10 ug/mL

## 2022-10-21 MED ORDER — SODIUM CHLORIDE 0.9 % IV SOLN: INTRAVENOUS | Status: AC

## 2022-10-21 NOTE — Progress Notes (Signed)
Having multiple Bms today.  Left knee dressing in place from previous surgery. Dr. Romeo Apple assessed and will change dressing tomorrow.  Knee immoblizer ordered.  PT assisted up to chair.

## 2022-10-21 NOTE — Progress Notes (Signed)
[facility] Progress Note  5 Days Post-Op  Subjective: SBFT with contrast through to the colon last night. Started on clears. Now with multiple Bms.   Objective: Vital signs in last 24 hours: Temp:  [97.6 F (36.4 C)-98 F (36.7 C)] 97.6 F (36.4 C) (09/17 1318) Pulse Rate:  [59-74] 64 (09/17 1318) Resp:  [16] 16 (09/17 0413) BP: (107-138)/(72-77) 117/74 (09/17 1318) SpO2:  [98 %-100 %] 98 % (09/17 1318) Weight:  [85.2 kg] 85.2 kg (09/17 0413) Last BM Date : 10/21/22  Intake/Output from previous day: 09/16 0701 - 09/17 0700 In: 317.6 [IV Piggyback:317.6] Out: 651 [Urine:650; Stool:1] Intake/Output this shift: Total I/O In: 720 [P.O.:720] Out: 500 [Urine:500]  General appearance: alert and no distress GI: soft, less distended, nontender   Lab Results:  Recent Labs    10/20/22 0458 10/21/22 0526  WBC 8.9 5.8  HGB 8.5* 8.2*  HCT 27.8* 26.4*  PLT 207 177   BMET Recent Labs    10/20/22 2013 10/21/22 0526  NA 131* 132*  K 4.2 4.1  CL 106 108  CO2 18* 18*  GLUCOSE 86 79  BUN 29* 26*  CREATININE 1.00 0.93  CALCIUM 8.3* 8.3*   PT/INR Recent Labs    10/19/22 0510  LABPROT 15.8*  INR 1.2    Studies/Results: DG Abd Portable 1V-Small Bowel Obstruction Protocol-initial, 8 hr delay  Result Date: 10/20/2022 CLINICAL DATA:  8 hour small-bowel follow up EXAM: PORTABLE ABDOMEN - 1 VIEW COMPARISON:  10/18/2020 FINDINGS: Administered contrast now lies entirely within the colon consistent with a partial small bowel obstruction. Persistent small bowel dilatation is noted stable from the previous day. No free air is seen. Bony abnormality is noted. IMPRESSION: Persistent small bowel dilatation although contrast has passed into the colon consistent with a partial small bowel obstruction. Electronically Signed   By: Alcide Clever M.D.   On: 10/20/2022 22:32    Anti-infectives: Anti-infectives (From admission, onward)    Start     Dose/Rate Route  Frequency Ordered Stop   10/18/22 1400  vancomycin (VANCOREADY) IVPB 1250 mg/250 mL        1,250 mg 166.7 mL/hr over 90 Minutes Intravenous Every 24 hours 10/17/22 1330     10/17/22 1415  vancomycin (VANCOREADY) IVPB 1500 mg/300 mL        1,500 mg 150 mL/hr over 120 Minutes Intravenous  Once 10/17/22 1318 10/17/22 1614   10/16/22 1415  cefTRIAXone (ROCEPHIN) 1 g in sodium chloride 0.9 % 100 mL IVPB        1 g 200 mL/hr over 30 Minutes Intravenous  Once 10/16/22 1402 10/16/22 1516       Assessment/Plan: Patient with resolving SBO. Doing well.  Full liquid diet this AM and now Soft diet  Bms loose, will monitor, do not  give anything to stop Bms    LOS: 4 days    Lucretia Roers 10/21/2022

## 2022-10-21 NOTE — Progress Notes (Signed)
Physical Therapy Treatment Patient Details Name: Logan Levine MRN: [id number] DOB: 09-22-1950 Today's Date: 10/21/2022   History of Present Illness Logan Levine is a 72 year old male with extensive history of chronic appetitive C with liver cirrhosis, HIV, anemia, BPH, chronic bronchitis, COPD, GERD,h/o genital herpes, syphilis, HTN, HLD, recent (10/08/22)  left excisional total knee arthroplasty due to infection-on IV antibiotic and Eliquis.   Currently residing in rehab center on IV antibiotics of vancomycin and Eliquis for 19 days.     Patient will discharge 3 days ago on 10/10/2022 from [facility].       Patient presenting to ED from [facility] center rehab with chief complaint of vomiting x 5 today bloody emesis was noted by the staff.  On arrival in ED patient's emesis was clear, normal    PT Comments  Patient agreeable for therapy and required Mod/max assist for donning LLE immobilizer.  Patient requiring increased with frequent rest breaks during supine to sitting and scooting to EOB requiring Mod/max assist for moving LLE.  Patient unable to fully stand using RW due to weakness, required stand pivot with RLE blocked to transfer to chair and tolerated staying up in chair with BLE elevated.  Patient will benefit from continued skilled physical therapy in hospital and recommended venue below to increase strength, balance, endurance for safe ADLs and gait.     If plan is discharge home, recommend the following: A lot of help with bathing/dressing/bathroom;A lot of help with walking and/or transfers;Help with stairs or ramp for entrance;Assistance with cooking/housework   Can travel by private vehicle     No  Equipment Recommendations  None recommended by PT    Recommendations for Other Services       Precautions / Restrictions Precautions Precautions: Fall Required Braces or Orthoses: Knee Immobilizer - Left Knee Immobilizer - Right: On when out of bed or  walking Restrictions Weight Bearing Restrictions: Yes LLE Weight Bearing: Weight bearing as tolerated     Mobility  Bed Mobility Overal bed mobility: Needs Assistance Bed Mobility: Supine to Sit     Supine to sit: Mod assist, Max assist     General bed mobility comments: increased time, labored movement    Transfers Overall transfer level: Needs assistance Equipment used: 1 person hand held assist Transfers: Sit to/from Stand, Bed to chair/wheelchair/BSC Sit to Stand: Max assist Stand pivot transfers: Mod assist, Max assist         General transfer comment: unable to transfer using RW due to weakness, required stand pivot with right knee blocked to transfer to chair    Ambulation/Gait                   Stairs             Wheelchair Mobility     Tilt Bed    Modified Rankin (Stroke Patients Only)       Balance Overall balance assessment: Needs assistance Sitting-balance support: Feet supported, No upper extremity supported Sitting balance-Leahy Scale: Fair Sitting balance - Comments: fair/good seated at EOB   Standing balance support: Reliant on assistive device for balance, Bilateral upper extremity supported Standing balance-Leahy Scale: Zero Standing balance comment: poor/zero, able to lift bottom partially off bed                            Cognition Arousal: Alert Behavior During Therapy: WFL for tasks assessed/performed Overall Cognitive Status: Within Functional Limits  for tasks assessed                                          Exercises General Exercises - Lower Extremity Long Arc Quad: Seated, AROM, Strengthening, Right, 10 reps Hip Flexion/Marching: Seated, AROM, Strengthening, Right, 10 reps Toe Raises: Seated, AROM, Strengthening, Both, 10 reps Heel Raises: Seated, AROM, Strengthening, Both, 10 reps    General Comments        Pertinent Vitals/Pain Pain Assessment Pain Assessment:  Faces Faces Pain Scale: Hurts even more Pain Location: Left knee Pain Descriptors / Indicators: Grimacing, Guarding, Aching, Sore Pain Intervention(s): Limited activity within patient's tolerance, Monitored during session, Repositioned    Home Living                          Prior Function            PT Goals (current goals can now be found in the care plan section) Acute Rehab PT Goals Patient Stated Goal: return home after rehab PT Goal Formulation: With patient Time For Goal Achievement: 10/31/22 Potential to Achieve Goals: Good Progress towards PT goals: Progressing toward goals    Frequency    Min 3X/week      PT Plan      Co-evaluation              AM-PAC PT "6 Clicks" Mobility   Outcome Measure  Help needed turning from your back to your side while in a flat bed without using bedrails?: A Lot Help needed moving from lying on your back to sitting on the side of a flat bed without using bedrails?: A Lot Help needed moving to and from a bed to a chair (including a wheelchair)?: A Lot Help needed standing up from a chair using your arms (e.g., wheelchair or bedside chair)?: A Lot Help needed to walk in hospital room?: Total Help needed climbing 3-5 steps with a railing? : Total 6 Click Score: 10    End of Session   Activity Tolerance: Patient tolerated treatment well;Patient limited by fatigue;Patient limited by pain Patient left: in chair;with call bell/phone within reach Nurse Communication: Mobility status PT Visit Diagnosis: Unsteadiness on feet (R26.81);Muscle weakness (generalized) (M62.81);Other abnormalities of gait and mobility (R26.89)     Time: 6948-5462 PT Time Calculation (min) (ACUTE ONLY): 32 min  Charges:    $Therapeutic Exercise: 8-22 mins $Therapeutic Activity: 8-22 mins PT General Charges $$ ACUTE PT VISIT: 1 Visit                     12:27 PM, 10/21/22 Ocie Bob, MPT Physical Therapist with [facility] 336 [phone] office [phone] mobile phone

## 2022-10-21 NOTE — Plan of Care (Signed)
Problem: Education: Goal: Knowledge of the prescribed therapeutic regimen will improve Outcome: Progressing   Problem: Activity: Goal: Ability to avoid complications of mobility impairment will improve Outcome: Progressing

## 2022-10-21 NOTE — Progress Notes (Signed)
Bms seemed to have slowed down some.  Last one was small amount of yellowish liquid.  Patent assisted back to bed with max assist and L knee immoblizer on and unable to use right leg to assist much

## 2022-10-21 NOTE — Assessment & Plan Note (Addendum)
-   Status post exchange arthroplasty, for knee infection, -In a knee immobilizer, on IV antibiotics, antibiotic spacer -Monitoring continue wound care -Orthopedic team Dr. Romeo Apple consulted, evaluated, no intervention, patient made stable -ID consulted, continue IV vancomycin as previous POA: IV vancomycin,  -PICC line was replaced
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
## 2022-10-21 NOTE — Progress Notes (Signed)
Patient had a large bowel movement during the night. Patient advanced to clear liquid diet per order from Dr. Henreitta Leber, patient tolerating new diet well. Patient resting at this time, no complaints of pain. Bed in lowest position, bed alarm activated, and call bell within reach.

## 2022-10-21 NOTE — Progress Notes (Signed)
PROGRESS NOTE    Patient: Logan Levine                            PCP: Toma Deiters, MD                    DOB: [birth year]            DOA: 10/16/2022 ZOX:[id number]             DOS: 10/21/2022, 12:16 PM   LOS: 4 days   Date of Service: The patient was seen and examined on 10/21/2022  Subjective:   The patient was seen and examined this morning, denies any nausea vomiting, tolerating liquid.  Per patient and nursing staff has had a large bowel movement during the night. Denies of any pain or discomfort reporting of gas  Brief Narrative:   Logan Levine is a 72 year old male with extensive history of chronic appetitive C with liver cirrhosis, HIV, anemia, BPH, chronic bronchitis, COPD, GERD,h/o genital herpes, syphilis, HTN, HLD, recent (10/08/22)  left excisional total knee arthroplasty due to infection-on IV antibiotic and Eliquis.  Currently residing in rehab center on IV antibiotics of vancomycin and Eliquis for 19 days.  Patient will discharge 3 days ago on 10/10/2022 from [facility].    Patient presenting to ED from [facility] with chief complaint of vomiting x 5 today bloody emesis was noted by the staff. On arrival in ED patient's emesis was clear, normal  Hemoglobin currently 10.5 (improved from previous admission 8.7, 8.3, now 10.5)  Sodium 131, BUN 42 creatinine 1.25,  CT abdomen/pelvis IMPRESSION: 1. Findings are compatible with small bowel obstruction with transition point identified within the left hemiabdomen. 2. Cirrhosis.  No splenomegaly or ascites. 3. Circumferential wall thickening involving the visualized portions of the distal esophagus. Esophageal varices identified with irregular hyperenhancement along the wall of the esophagus. 4. High attenuation material is noted layering within the dependent portion of the gastric fundus. This is indeterminate and may be seen with contrast ingestion. In a patient presenting with bloody  emesis (as reported in the clinical history) blood products within the lumen of the stomach cannot be excluded. 5. Sigmoid diverticulosis without signs of acute diverticulitis. 6. Fat containing inguinal hernias. 7.  Aortic Atherosclerosis    EDP has consulted and notified gastroenterologist and general surgery--requested patient to be admitted for further evaluation    Assessment & Plan:   Principal Problem:   Small bowel obstruction (HCC) Active Problems:   Gastrointestinal hemorrhage with hematemesis   Infection of prosthetic joint (HCC)   Dementia without behavioral disturbance (HCC)   HIV DISEASE   HYPERTENSION, BENIGN ESSENTIAL   History of revision of total knee arthroplasty   Hepatic cirrhosis due to chronic hepatitis C infection (HCC)   Acute esophagitis   Secondary esophageal varices without bleeding (HCC)   Upper GI bleed     Assessment and Plan: * Small bowel obstruction (HCC) - Improved abdominal distention, -NG tube was pulled out by patient x 2 -Per patient and nursing staff positive bowel movement large amount overnight  -Tolerating clear liquid diet, advanced per surgery   -Reporting of no nausea vomiting    POA:  Nausea vomiting x 5 today -initially was bloody now clear -If further vomiting will initiate NG tube placement -Finding of SBO per CT findings  -Resume IVF -General Surgery following -Will monitor closely  --We will repeat KUB  as needed  Gastrointestinal hemorrhage with hematemesis -Remained stable denies any melena rectal bleed, or bloody nausea vomiting  Monitoring, stable status post EGD - GI bleed, likely due to Eliquis which has been started by orthopedic team in the recent discharge for DVT prophylaxis -Patient also has liver cirrhosis due to chronic hepatitis C, and esophageal varices identified on the CT scan -Monitoring H&H closely    Latest Ref Rng & Units 10/21/2022    5:26 AM 10/20/2022    4:58 AM 10/19/2022    5:10  AM  CBC  WBC 4.0 - 10.5 K/uL 5.8  8.9  8.2   Hemoglobin 13.0 - 17.0 g/dL 8.2  8.5  8.5   Hematocrit 39.0 - 52.0 % 26.4  27.8  28.9   Platelets 150 - 400 K/uL 177  207  221    -10/17/2022 s/p EGD findings:Erosive esophagitis with bleeding, grade 2 esophageal varices nonbleeding, retained blood in duodenum   -Monitoring H&H,  -IV Protonix  - IV Octreotide discontinued -Started on Carvedilol 3.12 mg twice daily  -GI and general surgery consulted appreciated further evaluation and recommendations    Infection of prosthetic joint (HCC) - Left knee remains edematous, nontender, with no erythema Afebrile, normotensive Wound clean, staples in place-dressing in place  - Followed closely by ID team as an outpatient -Was recently was on Rocephin and vancomycin, antibiotics has been narrowed down to IV vancomycin only  -Patient has a PICC line right arm -was discontinued 10/17/2022 New left arm PICC line was placed 10/17/2022  -Discussed with ID, Dr. Daiva Eves,  recommending continue IV vancomycin Edema likely due to hematoma No signs of reinfection or SIRS/sepsis   Dementia without behavioral disturbance (HCC) -No behavioral disturbance, currently stable -Not on any medications  Upper GI bleed -10/17/2022 s/p EGD findings:Erosive esophagitis with bleeding, grade 2 esophageal varices nonbleeding, retained blood in duodenum -H&H remained stable, monitoring -Discontinued Eliquis -Continue PPI  Secondary esophageal varices without bleeding (HCC) - Improved nausea vomiting, no signs of active bleeding at this point -Nonbleeding varices were identified on EGD -IV octreotide, discontinued by GI -Will continue Protonix   Acute esophagitis -Continue PPI per GI -  Hepatic cirrhosis due to chronic hepatitis C infection (HCC) -Stable History of hepatitis C with hepatic liver cirrhosis, and esophageal varices -Monitoring closely -LFTs mildly elevated, avoiding hepatotoxins    Latest  Ref Rng & Units 10/19/2022    5:10 AM 10/16/2022   10:41 AM 06/11/2022    9:42 AM  Hepatic Function  Total Protein 6.5 - 8.1 g/dL 7.3  8.5  8.4   Albumin 3.5 - 5.0 g/dL 2.6  2.7  2.9   AST 15 - 41 U/L 58  84  54   ALT 0 - 44 U/L 40  48  34   Alk Phosphatase 38 - 126 U/L 77  92  137   Total Bilirubin 0.3 - 1.2 mg/dL 1.2  1.0  0.4   Bilirubin, Direct 0.0 - 0.2 mg/dL 0.4      -Monitoring  History of revision of total knee arthroplasty - Status post exchange arthroplasty, for knee infection, -In a knee immobilizer, on IV antibiotics, antibiotic spacer -Monitoring continue wound care -Orthopedic team Dr. Romeo Apple consulted, evaluated, no intervention, patient made stable -ID consulted, continue IV vancomycin as previous POA: IV vancomycin,  -PICC line was replaced   HYPERTENSION, BENIGN ESSENTIAL BP stable   Home medication losartan is on hold  -Utilizing as needed hydralazine IV  HIV DISEASE - Stable, ID  following as an outpatient closely -Will resume home medication including Raltegravir , Emtricitabine-tenofovir          ------------------------------------------------------------------------------------------------------------------------------------------------  DVT prophylaxis:  TED hose Start: 10/16/22 1359 SCDs Start: 10/16/22 1359   Code Status:   Code Status: Full Code  Family Communication: No family member present at bedside- Admission status:   Status is: Inpatient Remains inpatient appropriate because: Continue surveillance for GI bleed, small bowel obstruction, follow-up closely by GI and general surgery..  The antibiotics   Disposition: From  - home             Planning for discharge in to SNF in a.m. if tolerating p.o.  Procedures:   No admission procedures for hospital encounter.   Antimicrobials:  Anti-infectives (From admission, onward)    Start     Dose/Rate Route Frequency Ordered Stop   10/18/22 1400  vancomycin (VANCOREADY) IVPB 1250  mg/250 mL        1,250 mg 166.7 mL/hr over 90 Minutes Intravenous Every 24 hours 10/17/22 1330     10/17/22 1415  vancomycin (VANCOREADY) IVPB 1500 mg/300 mL        1,500 mg 150 mL/hr over 120 Minutes Intravenous  Once 10/17/22 1318 10/17/22 1614   10/16/22 1415  cefTRIAXone (ROCEPHIN) 1 g in sodium chloride 0.9 % 100 mL IVPB        1 g 200 mL/hr over 30 Minutes Intravenous  Once 10/16/22 1402 10/16/22 1516        Medication:   acidophilus  2 capsule Oral TID with meals   bisacodyl  10 mg Rectal BID   carvedilol  3.125 mg Oral BID WC   Chlorhexidine Gluconate Cloth  6 each Topical Daily   pantoprazole  40 mg Intravenous Q12H   senna-docusate  1 tablet Oral BID   sodium chloride flush  10-40 mL Intracatheter Q12H    acetaminophen **OR** acetaminophen, hydrALAZINE, HYDROmorphone (DILAUDID) injection, ipratropium, levalbuterol, ondansetron **OR** ondansetron (ZOFRAN) IV, oxyCODONE, sodium chloride flush, sodium phosphate, traZODone   Objective:   Vitals:   10/20/22 0500 10/20/22 1224 10/20/22 1937 10/21/22 0413  BP:  (!) 143/80 107/77 138/72  Pulse:   74 (!) 59  Resp:  17 16 16   Temp:  97.8 F (36.6 C) 98 F (36.7 C) 97.8 F (36.6 C)  TempSrc:      SpO2:   100% 100%  Weight: 85.9 kg   85.2 kg  Height:        Intake/Output Summary (Last 24 hours) at 10/21/2022 1216 Last data filed at 10/21/2022 1100 Gross per 24 hour  Intake 677.55 ml  Output 1151 ml  Net -473.45 ml   Filed Weights   10/19/22 0528 10/20/22 0500 10/21/22 0413  Weight: 81.8 kg 85.9 kg 85.2 kg     Physical examination:     General:  AAO x 3,  cooperative, no distress;   HEENT:  Normocephalic, PERRL, otherwise with in Normal limits   Neuro:  CNII-XII intact. , normal motor and sensation, reflexes intact   Lungs:   Clear to auscultation BL, Respirations unlabored,  No wheezes / crackles  Cardio:    S1/S2, RRR, No murmure, No Rubs or Gallops   Abdomen:  Soft, distended, hypoactive bowel  sounds, no guarding or peritoneal signs.  Muscular  skeletal:  Left knee edema-no pain-dressing in place and immobilizer Limited exam -global generalized weaknesses - in bed, able to move all 4 extremities,   2+ pulses,  symmetric, left knee edema-no peripheral edema  Skin:  Dry, warm to touch, left knee surgical wound dressing in place  Wounds: Please see nursing documentation     ------------------------------------------------------------------------------------------------------------------------------------------    LABs:     Latest Ref Rng & Units 10/21/2022    5:26 AM 10/20/2022    4:58 AM 10/19/2022    5:10 AM  CBC  WBC 4.0 - 10.5 K/uL 5.8  8.9  8.2   Hemoglobin 13.0 - 17.0 g/dL 8.2  8.5  8.5   Hematocrit 39.0 - 52.0 % 26.4  27.8  28.9   Platelets 150 - 400 K/uL 177  207  221       Latest Ref Rng & Units 10/21/2022    5:26 AM 10/20/2022    8:13 PM 10/20/2022    4:58 AM  CMP  Glucose 70 - 99 mg/dL 79  86  92   BUN 8 - 23 mg/dL 26  29  35   Creatinine 0.61 - 1.24 mg/dL 3.08  6.57  8.46   Sodium 135 - 145 mmol/L 132  131  130   Potassium 3.5 - 5.1 mmol/L 4.1  4.2  4.3   Chloride 98 - 111 mmol/L 108  106  104   CO2 22 - 32 mmol/L 18  18  18    Calcium 8.9 - 10.3 mg/dL 8.3  8.3  8.6        Micro Results No results found for this or any previous visit (from the past 240 hour(s)).   Radiology Reports DG Abd Portable 1V-Small Bowel Obstruction Protocol-initial, 8 hr delay  Result Date: 10/20/2022 CLINICAL DATA:  8 hour small-bowel follow up EXAM: PORTABLE ABDOMEN - 1 VIEW COMPARISON:  10/18/2020 FINDINGS: Administered contrast now lies entirely within the colon consistent with a partial small bowel obstruction. Persistent small bowel dilatation is noted stable from the previous day. No free air is seen. Bony abnormality is noted. IMPRESSION: Persistent small bowel dilatation although contrast has passed into the colon consistent with a partial small bowel obstruction.  Electronically Signed   By: Alcide Clever M.D.   On: 10/20/2022 22:32    SIGNED: Kendell Bane, MD, FHM. FAAFP. [facility] - [facility] Time spent - 55 min.  In seeing, evaluating and examining the patient. Reviewing medical records, labs, drawn plan of care. [facility],  Pager (please use [email or web address] to page/ text) Please use Epic Secure Chat for non-urgent communication (7AM-7PM)  If 7PM-7AM, please contact night-coverage [email or web address], 10/21/2022, 12:16 PM

## 2022-10-22 DIAGNOSIS — R5381 Other malaise: Secondary | ICD-10-CM | POA: Diagnosis not present

## 2022-10-22 DIAGNOSIS — H524 Presbyopia: Secondary | ICD-10-CM | POA: Diagnosis not present

## 2022-10-22 DIAGNOSIS — M1732 Unilateral post-traumatic osteoarthritis, left knee: Secondary | ICD-10-CM | POA: Diagnosis not present

## 2022-10-22 DIAGNOSIS — M009 Pyogenic arthritis, unspecified: Secondary | ICD-10-CM | POA: Diagnosis not present

## 2022-10-22 DIAGNOSIS — Z96652 Presence of left artificial knee joint: Secondary | ICD-10-CM | POA: Diagnosis not present

## 2022-10-22 DIAGNOSIS — Z21 Asymptomatic human immunodeficiency virus [HIV] infection status: Secondary | ICD-10-CM | POA: Diagnosis not present

## 2022-10-22 DIAGNOSIS — J99 Respiratory disorders in diseases classified elsewhere: Secondary | ICD-10-CM | POA: Diagnosis not present

## 2022-10-22 DIAGNOSIS — K5900 Constipation, unspecified: Secondary | ICD-10-CM | POA: Diagnosis not present

## 2022-10-22 DIAGNOSIS — Z471 Aftercare following joint replacement surgery: Secondary | ICD-10-CM | POA: Diagnosis not present

## 2022-10-22 DIAGNOSIS — Z8719 Personal history of other diseases of the digestive system: Secondary | ICD-10-CM | POA: Diagnosis not present

## 2022-10-22 DIAGNOSIS — F1011 Alcohol abuse, in remission: Secondary | ICD-10-CM | POA: Diagnosis not present

## 2022-10-22 DIAGNOSIS — I8500 Esophageal varices without bleeding: Secondary | ICD-10-CM | POA: Diagnosis not present

## 2022-10-22 DIAGNOSIS — K7460 Unspecified cirrhosis of liver: Secondary | ICD-10-CM | POA: Diagnosis not present

## 2022-10-22 DIAGNOSIS — D8481 Immunodeficiency due to conditions classified elsewhere: Secondary | ICD-10-CM | POA: Diagnosis not present

## 2022-10-22 DIAGNOSIS — B20 Human immunodeficiency virus [HIV] disease: Secondary | ICD-10-CM | POA: Diagnosis present

## 2022-10-22 DIAGNOSIS — J449 Chronic obstructive pulmonary disease, unspecified: Secondary | ICD-10-CM | POA: Diagnosis not present

## 2022-10-22 DIAGNOSIS — R41841 Cognitive communication deficit: Secondary | ICD-10-CM | POA: Diagnosis not present

## 2022-10-22 DIAGNOSIS — B182 Chronic viral hepatitis C: Secondary | ICD-10-CM | POA: Diagnosis not present

## 2022-10-22 DIAGNOSIS — H903 Sensorineural hearing loss, bilateral: Secondary | ICD-10-CM | POA: Diagnosis not present

## 2022-10-22 DIAGNOSIS — A6002 Herpesviral infection of other male genital organs: Secondary | ICD-10-CM | POA: Diagnosis not present

## 2022-10-22 DIAGNOSIS — T8454XD Infection and inflammatory reaction due to internal left knee prosthesis, subsequent encounter: Secondary | ICD-10-CM | POA: Diagnosis not present

## 2022-10-22 DIAGNOSIS — E785 Hyperlipidemia, unspecified: Secondary | ICD-10-CM | POA: Diagnosis not present

## 2022-10-22 DIAGNOSIS — E441 Mild protein-calorie malnutrition: Secondary | ICD-10-CM | POA: Diagnosis not present

## 2022-10-22 DIAGNOSIS — I8510 Secondary esophageal varices without bleeding: Secondary | ICD-10-CM | POA: Diagnosis not present

## 2022-10-22 DIAGNOSIS — M1990 Unspecified osteoarthritis, unspecified site: Secondary | ICD-10-CM | POA: Diagnosis not present

## 2022-10-22 DIAGNOSIS — Z792 Long term (current) use of antibiotics: Secondary | ICD-10-CM | POA: Diagnosis not present

## 2022-10-22 DIAGNOSIS — T8454XA Infection and inflammatory reaction due to internal left knee prosthesis, initial encounter: Secondary | ICD-10-CM | POA: Diagnosis not present

## 2022-10-22 DIAGNOSIS — M6281 Muscle weakness (generalized): Secondary | ICD-10-CM | POA: Diagnosis not present

## 2022-10-22 DIAGNOSIS — D649 Anemia, unspecified: Secondary | ICD-10-CM | POA: Diagnosis not present

## 2022-10-22 DIAGNOSIS — K219 Gastro-esophageal reflux disease without esophagitis: Secondary | ICD-10-CM | POA: Diagnosis not present

## 2022-10-22 DIAGNOSIS — K920 Hematemesis: Secondary | ICD-10-CM | POA: Diagnosis not present

## 2022-10-22 DIAGNOSIS — T8450XD Infection and inflammatory reaction due to unspecified internal joint prosthesis, subsequent encounter: Secondary | ICD-10-CM | POA: Diagnosis not present

## 2022-10-22 DIAGNOSIS — G4700 Insomnia, unspecified: Secondary | ICD-10-CM | POA: Diagnosis not present

## 2022-10-22 DIAGNOSIS — A539 Syphilis, unspecified: Secondary | ICD-10-CM | POA: Diagnosis not present

## 2022-10-22 DIAGNOSIS — J4489 Other specified chronic obstructive pulmonary disease: Secondary | ICD-10-CM | POA: Diagnosis not present

## 2022-10-22 DIAGNOSIS — L7682 Other postprocedural complications of skin and subcutaneous tissue: Secondary | ICD-10-CM | POA: Diagnosis not present

## 2022-10-22 DIAGNOSIS — H2513 Age-related nuclear cataract, bilateral: Secondary | ICD-10-CM | POA: Diagnosis not present

## 2022-10-22 DIAGNOSIS — N401 Enlarged prostate with lower urinary tract symptoms: Secondary | ICD-10-CM | POA: Diagnosis not present

## 2022-10-22 DIAGNOSIS — F0390 Unspecified dementia without behavioral disturbance: Secondary | ICD-10-CM | POA: Diagnosis not present

## 2022-10-22 DIAGNOSIS — H18413 Arcus senilis, bilateral: Secondary | ICD-10-CM | POA: Diagnosis not present

## 2022-10-22 DIAGNOSIS — M109 Gout, unspecified: Secondary | ICD-10-CM | POA: Diagnosis not present

## 2022-10-22 DIAGNOSIS — K7469 Other cirrhosis of liver: Secondary | ICD-10-CM | POA: Diagnosis not present

## 2022-10-22 DIAGNOSIS — I7091 Generalized atherosclerosis: Secondary | ICD-10-CM | POA: Diagnosis not present

## 2022-10-22 DIAGNOSIS — K2091 Esophagitis, unspecified with bleeding: Secondary | ICD-10-CM | POA: Diagnosis not present

## 2022-10-22 DIAGNOSIS — Z8619 Personal history of other infectious and parasitic diseases: Secondary | ICD-10-CM | POA: Diagnosis not present

## 2022-10-22 DIAGNOSIS — H11153 Pinguecula, bilateral: Secondary | ICD-10-CM | POA: Diagnosis not present

## 2022-10-22 DIAGNOSIS — K56609 Unspecified intestinal obstruction, unspecified as to partial versus complete obstruction: Secondary | ICD-10-CM | POA: Diagnosis not present

## 2022-10-22 LAB — GLUCOSE, CAPILLARY: Glucose-Capillary: 133 mg/dL — ABNORMAL HIGH (ref 70–99)

## 2022-10-22 MED ORDER — TRAMADOL HCL 50 MG PO TABS: 50.0000 mg | ORAL_TABLET | Freq: Four times a day (QID) | ORAL | 0 refills | Status: DC | PRN

## 2022-10-22 MED ORDER — OXYCODONE HCL 5 MG PO TABS: 5.0000 mg | ORAL_TABLET | Freq: Four times a day (QID) | ORAL | 0 refills | Status: DC | PRN

## 2022-10-22 MED ORDER — VANCOMYCIN IV (FOR PTA / DISCHARGE USE ONLY): 1250.0000 mg | INTRAVENOUS | 0 refills | Status: AC

## 2022-10-22 MED ORDER — CARVEDILOL 3.125 MG PO TABS: 3.1250 mg | ORAL_TABLET | Freq: Two times a day (BID) | ORAL | 0 refills | Status: DC

## 2022-10-22 MED ORDER — MUSCLE RUB 10-15 % EX CREA
TOPICAL_CREAM | CUTANEOUS | Status: DC | PRN
  Filled 2022-10-22: qty 85

## 2022-10-22 MED ORDER — MUSCLE RUB 10-15 % EX CREA: 1.0000 | TOPICAL_CREAM | CUTANEOUS | 0 refills | Status: DC | PRN

## 2022-10-22 MED ORDER — PANTOPRAZOLE SODIUM 40 MG PO TBEC: 40.0000 mg | DELAYED_RELEASE_TABLET | Freq: Two times a day (BID) | ORAL | 0 refills | Status: DC

## 2022-10-22 NOTE — Progress Notes (Signed)
NO acute events overnight. Karl Ito, RN

## 2022-10-22 NOTE — Plan of Care (Signed)
  Problem: Education: Goal: Knowledge of the prescribed therapeutic regimen will improve Outcome: Adequate for Discharge   Problem: Activity: Goal: Ability to avoid complications of mobility impairment will improve Outcome: Not Met (add Reason)

## 2022-10-22 NOTE — TOC Transition Note (Signed)
Transition of Care [facility]) - CM/SW Discharge Note   Patient Details  Name: Logan Levine MRN: [id number] Date of Birth: 06-04-1950  [facility] CM/SW Contact:  Karn Cassis, LCSW Phone Number: 10/22/2022, 11:33 AM   Clinical Narrative: Pt d/c today back to [facility]. Pt and facility aware and agreeable. Pt requests that LCSW not notify family. He said he would call them when he got back to [facility]. D/C summary sent to SNF. RN given number to call report. Pt requests transport via [facility]. LCSW to arrange.         Final next level of care: Skilled Nursing Facility Barriers to Discharge: Barriers Resolved   Patient Goals and CMS Choice      Discharge Placement                Patient chooses bed at: [facility] Patient to be transferred to facility by: [facility] Name of family member notified: Patient only- at his request Patient and family notified of of transfer: 10/22/22  Discharge Plan and Services Additional resources added to the After Visit Summary for                                       Social Determinants of Health (SDOH) Interventions SDOH Screenings   Food Insecurity: No Food Insecurity (10/08/2022)  Housing: Low Risk  (10/08/2022)  Transportation Needs: No Transportation Needs (10/08/2022)  Recent Concern: Transportation Needs - Unmet Transportation Needs (09/15/2022)   Received from [facility]  Utilities: Not At Risk (10/08/2022)  Financial Resource Strain: Low Risk  (06/09/2020)   Received from [facility], [facility]  Tobacco Use: Medium Risk (10/16/2022)     Readmission Risk Interventions     No data to display

## 2022-10-22 NOTE — Progress Notes (Signed)
[facility]  Doing well. Having Bms. Ready to go home.  BP 114/84 (BP Location: Right Arm)   Pulse 70   Temp 98.5 F (36.9 C) (Oral)   Resp 18   Ht 5\' 8"  (1.727 m)   Wt 85 kg   SpO2 100%   BMI 28.48 kg/m   Soft, nondistended, nontender  Resolving SBO.  PCP follow up   Algis Greenhouse, MD [facility] [address] [place], [place] [address] [phone] (office)

## 2022-10-22 NOTE — Care Management Important Message (Signed)
Important Message  Patient Details  Name: JARAMIAH JUD MRN: [id number] Date of Birth: 03-28-1950   Medicare Important Message Given:  N/A - LOS <3 / Initial given by admissions     [name] 10/22/2022, 1:25 PM

## 2022-10-22 NOTE — Discharge Summary (Signed)
Physician Discharge Summary  Logan Levine OAC:[id number] DOB: [birth year] DOA: 10/16/2022  PCP: Toma Deiters, MD  Admit date: 10/16/2022  Discharge date: 10/22/2022  Admitted From:SNF  Disposition:  SNF  Recommendations for Outpatient Follow-up:  Follow up with PCP in 1-2 weeks Follow-up with GI Dr. Levon Hedger 10/7 as scheduled Follow-up with ID Dr. Drue Second on 10/10 as scheduled Follow-up with orthopedics Dr. Despina Hick as recommended with referral sent Continue on IV vancomycin for knee prosthetic infection through 11/19/2022 with PICC line to left upper extremity Continue other medications as noted below Discontinue further use of Eliquis Continue PPI twice daily, okay to resume aspirin 81 mg daily  Home Health: None  Equipment/Devices: None  Discharge Condition:Stable  CODE STATUS: Full  Diet recommendation: Heart Healthy  Brief/Interim Summary: Logan Levine is a 72 year old male with extensive history of chronic appetitive C with liver cirrhosis, HIV, anemia, BPH, chronic bronchitis, COPD, GERD,h/o genital herpes, syphilis, HTN, HLD, recent (10/08/22)  left excisional total knee arthroplasty due to infection-on IV antibiotic and Eliquis.  Currently residing in rehab center on IV antibiotics of vancomycin and Eliquis for 19 days.  He recently had exchange arthroplasty insertion of antibiotic spacer for infected left total knee and was on IV antibiotics with vancomycin.  He developed 5 episodes of bloody emesis and was admitted for further evaluation with discontinuation of Eliquis.  He underwent EGD on 9/13 with findings of erosive esophagitis with bleeding and grade 2 esophageal varices that were nonbleeding with retained blood in the duodenum.  He remained on IV Protonix and diet was slowly advanced.  He was also noted to have findings of small bowel obstruction and required NG tube placement subsequently.  He is now tolerating dietary advancement and is having bowel movements.   Hemoglobin appears stable with no further signs of bleeding noted and he will follow-up as noted above.  Discharge Diagnoses:  Principal Problem:   Small bowel obstruction (HCC) Active Problems:   Gastrointestinal hemorrhage with hematemesis   Infection of prosthetic joint (HCC)   Dementia without behavioral disturbance (HCC)   HIV DISEASE   HYPERTENSION, BENIGN ESSENTIAL   History of revision of total knee arthroplasty   Hepatic cirrhosis due to chronic hepatitis C infection (HCC)   Acute esophagitis   Secondary esophageal varices without bleeding (HCC)   Upper GI bleed  Principal discharge diagnosis: Upper GI bleeding with hematemesis in the setting of erosive esophagitis as well as small bowel obstruction.  Discharge Instructions  Discharge Instructions     AMB referral to orthopedics   Complete by: As directed    [facility] pharmacist to adjust dose for Vancomycin, Aminoglycosides and other anti-infective therapies as requested by physician.   Complete by: As directed    [facility] to provide Cath Flo 2mg    Complete by: As directed    Administer for PICC line occlusion and as ordered by physician for other access device issues.   Anaphylaxis Kit: Provided to treat any anaphylactic reaction to the medication being provided to the patient if First Dose or when requested by physician   Complete by: As directed    Epinephrine 1mg /ml vial / amp: Administer 0.3mg  (0.3ml) subcutaneously once for moderate to severe anaphylaxis, nurse to call physician and pharmacy when reaction occurs and call 911 if needed for immediate care   Diphenhydramine 50mg /ml IV vial: Administer 25-50mg  IV/IM PRN for first dose reaction, rash, itching, mild reaction, nurse to call physician and pharmacy when reaction occurs  Sodium Chloride 0.9% NS IV: Administer if needed for hypovolemic blood pressure drop or as ordered by physician after call to physician with anaphylactic  reaction   Change dressing on IV access line weekly and PRN   Complete by: As directed    Diet - low sodium heart healthy   Complete by: As directed    Flush IV access with Sodium Chloride 0.9% and Heparin 10 units/ml or 100 units/ml   Complete by: As directed    Home infusion instructions - [facility]   Complete by: As directed    Instructions: Flush IV access with Sodium Chloride 0.9% and Heparin 10units/ml or 100units/ml   Change dressing on IV access line: Weekly and PRN   Instructions Cath Flo 2mg : Administer for PICC Line occlusion and as ordered by physician for other access device   [facility] pharmacist to adjust dose for: Vancomycin, Aminoglycosides and other anti-infective therapies as requested by physician   If the dressing is still on your incision site when you go home, remove it on the third day after your surgery date. Remove dressing if it begins to fall off, or if it is dirty or damaged before the third day.   Complete by: As directed    Increase activity slowly   Complete by: As directed    Method of administration may be changed at the discretion of home infusion pharmacist based upon assessment of the patient and/or caregiver's ability to self-administer the medication ordered   Complete by: As directed       Allergies as of 10/22/2022       Reactions   Morphine Other (See Comments)   No reaction listed on MAR   Nsaids Other (See Comments)   No reaction listed on MAR        Medication List     STOP taking these medications    apixaban 2.5 MG Tabs tablet Commonly known as: ELIQUIS   cefTRIAXone IVPB Commonly known as: ROCEPHIN   losartan 100 MG tablet Commonly known as: COZAAR       TAKE these medications    acetaminophen 325 MG tablet Commonly known as: TYLENOL Take 325 mg by mouth every 4 (four) hours as needed for mild pain, fever or headache.   albuterol 108 (90 Base) MCG/ACT inhaler Commonly known as: VENTOLIN  HFA Inhale 2 puffs into the lungs every 6 (six) hours as needed for wheezing or shortness of breath.   aspirin EC 81 MG tablet Take 81 mg by mouth daily.   carvedilol 3.125 MG tablet Commonly known as: COREG Take 1 tablet (3.125 mg total) by mouth 2 (two) times daily with a meal.   Descovy 200-25 MG tablet Generic drug: emtricitabine-tenofovir AF Take 1 tablet by mouth daily.   Fluticasone Propionate Diskus 100 MCG/ACT Aepb Inhale 1 puff into the lungs in the morning and at bedtime.   heparin 5000 UNIT/ML injection Inject 5,000 Units into the skin every 8 (eight) hours.   methocarbamol 500 MG tablet Commonly known as: ROBAXIN Take 1 tablet (500 mg total) by mouth every 6 (six) hours as needed for muscle spasms.   Muscle Rub 10-15 % Crea Apply 1 Application topically as needed for muscle pain.   omeprazole 10 MG capsule Commonly known as: PRILOSEC Take 10 mg by mouth daily.   ondansetron 4 MG tablet Commonly known as: ZOFRAN Take 1 tablet (4 mg total) by mouth every 6 (six) hours as needed for nausea.   oxyCODONE  5 MG immediate release tablet Commonly known as: Oxy IR/ROXICODONE Take 1-2 tablets (5-10 mg total) by mouth every 6 (six) hours as needed for severe pain.   pantoprazole 40 MG tablet Commonly known as: Protonix Take 1 tablet (40 mg total) by mouth 2 (two) times daily.   polyethylene glycol powder 17 GM/SCOOP powder Commonly known as: GLYCOLAX/MIRALAX Measure 17 g of powder, mix in 4 oz of water or juice & drink by mouth daily as needed for mild constipation.   Qvar RediHaler 80 MCG/ACT inhaler Generic drug: beclomethasone Inhale 1 puff into the lungs 2 (two) times daily.   raltegravir 400 MG tablet Commonly known as: ISENTRESS Take 400 mg by mouth 2 (two) times daily.   sulfamethoxazole-trimethoprim 400-80 MG tablet Commonly known as: BACTRIM Take 1 tablet by mouth daily.   tamsulosin 0.4 MG Caps capsule Commonly known as: FLOMAX Take 0.8 mg by  mouth daily.   traMADol 50 MG tablet Commonly known as: ULTRAM Take 1-2 tablets (50-100 mg total) by mouth every 6 (six) hours as needed for moderate pain.   valACYclovir 500 MG tablet Commonly known as: VALTREX Take 500 mg by mouth daily.   vancomycin IVPB Inject 1,250 mg into the vein daily. Indication:  prosthetic joint infection First Dose: Yes Last Day of Therapy:  11/19/22 Labs - "Sunday/Monday:  CBC/D, BMP, and vancomycin trough. Labs - Thursday:  BMP and vancomycin trough Labs - Once weekly: ESR and CRP Method of administration:Elastomeric Method of administration may be changed at the discretion of the patient and/or caregiver's ability to self-administer the medication ordered. What changed: additional instructions               Discharge Care Instructions  (From admission, onward)           Start     Ordered   10/22/22 0000  Change dressing on IV access line weekly and PRN  (Home infusion instructions - [facility] )        10/22/22 1029   10/22/22 0000  If the dressing is still on your incision site when you go home, remove it on the third day after your surgery date. Remove dressing if it begins to fall off, or if it is dirty or damaged before the third day.        09" /18/24 1029            Contact information for follow-up providers     Hasanaj, Myra Gianotti, MD. Schedule an appointment as soon as possible for a visit in 1 week(s).   Specialty: Internal Medicine Contact information: [address] [place] [address] [phone]         Ollen Gross, MD. Go to.   Specialty: Orthopedic Surgery Contact information: [address] [place] [address] [phone]         Judyann Munson, MD Follow up on 11/13/2022.   Specialty: Infectious Diseases Contact information: [address] [place] [address] [phone]         Dolores Frame, MD Follow up on 11/10/2022.    Specialty: Gastroenterology Contact information: [address] [place] [address] [phone]              Contact information for after-discharge care     Destination     [facility] .   Service: Skilled Nursing Contact information: [address] [place] [address] [phone]  Allergies  Allergen Reactions   Morphine Other (See Comments)    No reaction listed on MAR   Nsaids Other (See Comments)    No reaction listed on MAR    Consultations: GI Orthopedics ID General Surgery   Procedures/Studies: DG Abd Portable 1V-Small Bowel Obstruction Protocol-initial, 8 hr delay  Result Date: 10/20/2022 CLINICAL DATA:  8 hour small-bowel follow up EXAM: PORTABLE ABDOMEN - 1 VIEW COMPARISON:  10/18/2020 FINDINGS: Administered contrast now lies entirely within the colon consistent with a partial small bowel obstruction. Persistent small bowel dilatation is noted stable from the previous day. No free air is seen. Bony abnormality is noted. IMPRESSION: Persistent small bowel dilatation although contrast has passed into the colon consistent with a partial small bowel obstruction. Electronically Signed   By: Alcide Clever M.D.   On: 10/20/2022 22:32   DG CHEST PORT 1 VIEW  Result Date: 10/19/2022 CLINICAL DATA:  Enteric tube placement. EXAM: PORTABLE CHEST 1 VIEW COMPARISON:  Abdominal radiograph performed earlier today. FINDINGS: An enteric tube terminates in the stomach. IMPRESSION: Enteric tube terminates in the stomach. Electronically Signed   By: Romona Curls M.D.   On: 10/19/2022 15:43   DG Abd 1 View  Result Date: 10/19/2022 CLINICAL DATA:  [id number] SBO (small bowel obstruction) (HCC) [id number] EXAM: ABDOMEN - 1 VIEW COMPARISON:  October 17, 2022 FINDINGS: Persistent diffuse gaseous dilation of loops of small bowel, most pronounced in the LEFT upper abdomen. Extent of dilation is overall  increased in comparison to prior with representative loop of bowel spanning approximately 3.5 cm. Degenerative changes of the lower lumbar spine. IMPRESSION: Persistent diffuse gaseous dilation of loops of small bowel, most pronounced in the LEFT upper abdomen. Extent of dilation is overall increased in comparison to prior. This is most consistent with persistent small-bowel obstruction. Electronically Signed   By: Meda Klinefelter M.D.   On: 10/19/2022 13:11   DG Abd 1 View  Result Date: 10/17/2022 CLINICAL DATA:  Small bowel obstruction EXAM: ABDOMEN - 1 VIEW COMPARISON:  CT abdomen pelvis, 10/16/2022 FINDINGS: Diffusely gas-filled, although not overtly distended small bowel and colon, with gas and stool present to the rectum. No obvious free air on partial single supine radiograph of the abdomen. IMPRESSION: Diffusely gas-filled, although not overtly distended small bowel and colon, with gas and stool present to the rectum. Electronically Signed   By: Jearld Lesch M.D.   On: 10/17/2022 11:17   [place] EKG SITE RITE  Result Date: 10/17/2022 If Site Rite image not attached, placement could not be confirmed due to current cardiac rhythm.  CT ABDOMEN PELVIS W CONTRAST  Result Date: 10/16/2022 CLINICAL DATA:  Acute abdominal pain with vomiting for 5 days. Blood in emesis today. EXAM: CT ABDOMEN AND PELVIS WITH CONTRAST TECHNIQUE: Multidetector CT imaging of the abdomen and pelvis was performed using the standard protocol following bolus administration of intravenous contrast. RADIATION DOSE REDUCTION: This exam was performed according to the departmental dose-optimization program which includes automated exposure control, adjustment of the mA and/or kV according to patient size and/or use of iterative reconstruction technique. CONTRAST:  OMNIPAQUE IOHEXOL 300 MG/ML  SOLN COMPARISON:  05/25/2007 FINDINGS: Lower chest: The lung bases are clear. No pleural effusion or consolidative change. There is  circumferential wall thickening involving the visualized portions of the distal esophagus. Esophageal varices identified with hyperenhancement along the wall of the esophagus. Hepatobiliary: The liver has an atrophic appearance with diffusely nodular contour compatible with cirrhosis. Scattered calcified granulomas. No enhancing liver  lesions. Cholecystectomy. No bile duct dilatation. Pancreas: Unremarkable. No pancreatic ductal dilatation or surrounding inflammatory changes. Spleen: Normal in size without focal abnormality. Adrenals/Urinary Tract: Right adrenal nodule measures 1 cm and is unchanged from 2009 compatible with a benign adenoma. No follow-up imaging recommended. Normal left adrenal gland. Two left kidney cysts are identified. The largest arises off the posterior aspect of the left upper pole measuring 6.5 cm. No nephrolithiasis or hydronephrosis. Urinary bladder appears within normal limits. Stomach/Bowel: Moderate distension of the stomach with air-fluid level. High density material layers within the posterior, dependent aspect of the gastric fundus, image 17/3. The proximal small bowel loops are dilated with multiple air-fluid levels measuring up to 4.2 cm. Decreased caliber distal small bowel loops. The transition point is identified within the left hemiabdomen, image 38/3 no abnormal dilatation of the colon. Sigmoid diverticulosis without signs of acute diverticulitis. Vascular/Lymphatic: Aortic atherosclerosis without aneurysm. No signs of abdominal adenopathy or pelvic adenopathy. The upper abdominal vascularity appears patent. Reproductive: Prostate is unremarkable. Other: No ascites or focal fluid collections identified. Fat containing inguinal hernias noted. 2 Musculoskeletal: Multilevel degenerative disc disease identified within the lumbar spine. No acute or suspicious osseous findings. IMPRESSION: 1. Findings are compatible with small bowel obstruction with transition point identified  within the left hemiabdomen. 2. Cirrhosis.  No splenomegaly or ascites. 3. Circumferential wall thickening involving the visualized portions of the distal esophagus. Esophageal varices identified with irregular hyperenhancement along the wall of the esophagus. 4. High attenuation material is noted layering within the dependent portion of the gastric fundus. This is indeterminate and may be seen with contrast ingestion. In a patient presenting with bloody emesis (as reported in the clinical history) blood products within the lumen of the stomach cannot be excluded. 5. Sigmoid diverticulosis without signs of acute diverticulitis. 6. Fat containing inguinal hernias. 7.  Aortic Atherosclerosis (ICD10-I70.0). These results were called by telephone at the time of interpretation on 10/16/2022 at 1:41 pm to provider [facility] , who verbally acknowledged these results. Electronically Signed   By: Signa Kell M.D.   On: 10/16/2022 13:42   DG Chest Port 1 View  Result Date: 10/16/2022 CLINICAL DATA:  Vomiting, upper abdominal pain. EXAM: PORTABLE CHEST 1 VIEW COMPARISON:  Jun 25, 2009. FINDINGS: The heart size and mediastinal contours are within normal limits. Hypoinflation of the lungs. Both lungs are clear. Severe degenerative changes seen involving the left glenohumeral joint. Right-sided PICC line is noted with distal tip in expected position of the SVC. IMPRESSION: Hypoinflation of the lungs.  No acute pulmonary abnormality seen. Electronically Signed   By: Lupita Raider M.D.   On: 10/16/2022 11:46   [place] EKG SITE RITE  Result Date: 10/09/2022 If Site Rite image not attached, placement could not be confirmed due to current cardiac rhythm.  [place] EKG SITE RITE  Result Date: 10/08/2022 If Site Rite image not attached, placement could not be confirmed due to current cardiac rhythm.    Discharge Exam: Vitals:   10/21/22 2107 10/22/22 0555  BP: 121/71 114/84  Pulse: 65 70  Resp: 20 18  Temp: 98.3 F  (36.8 C) 98.5 F (36.9 C)  SpO2: 99% 100%   Vitals:   10/21/22 0413 10/21/22 1318 10/21/22 2107 10/22/22 0555  BP: 138/72 117/74 121/71 114/84  Pulse: (!) 59 64 65 70  Resp: 16  20 18   Temp: 97.8 F (36.6 C) 97.6 F (36.4 C) 98.3 F (36.8 C) 98.5 F (36.9 C)  TempSrc:  Oral  SpO2: 100% 98% 99% 100%  Weight: 85.2 kg   85 kg  Height:        General: Pt is alert, awake, not in acute distress Cardiovascular: RRR, S1/S2 +, no rubs, no gallops Respiratory: CTA bilaterally, no wheezing, no rhonchi Abdominal: Soft, NT, ND, bowel sounds + Extremities: no edema, no cyanosis, left upper extremity PICC line    The results of significant diagnostics from this hospitalization (including imaging, microbiology, ancillary and laboratory) are listed below for reference.     Microbiology: No results found for this or any previous visit (from the past 240 hour(s)).   Labs: BNP (last 3 results) No results for input(s): "BNP" in the last 8760 hours. Basic Metabolic Panel: Recent Labs  Lab 10/16/22 1041 10/16/22 1402 10/17/22 0503 10/18/22 0442 10/19/22 0510 10/20/22 0458 10/20/22 2013 10/21/22 0526  NA 131*  --    < > 133* 130* 130* 131* 132*  K 4.3  --    < > 4.6 4.1 4.3 4.2 4.1  CL 100  --    < > 108 104 104 106 108  CO2 22  --    < > 18* 19* 18* 18* 18*  GLUCOSE 112*  --    < > 122* 103* 92 86 79  BUN 42*  --    < > 41* 36* 35* 29* 26*  CREATININE 1.25*  --    < > 1.27* 1.14 1.30* 1.00 0.93  CALCIUM 8.5*  --    < > 8.5* 8.4* 8.6* 8.3* 8.3*  MG 2.2  --   --   --   --   --   --   --   PHOS  --  3.1  --   --   --   --   --   --    < > = values in this interval not displayed.   Liver Function Tests: Recent Labs  Lab 10/16/22 1041 10/19/22 0510  AST 84* 58*  ALT 48* 40  ALKPHOS 92 77  BILITOT 1.0 1.2  PROT 8.5* 7.3  ALBUMIN 2.7* 2.6*   Recent Labs  Lab 10/16/22 1041  LIPASE 75*   No results for input(s): "AMMONIA" in the last 168 hours. CBC: Recent Labs  Lab  10/17/22 0503 10/18/22 0442 10/19/22 0510 10/20/22 0458 10/21/22 0526  WBC 8.4 9.4 8.2 8.9 5.8  HGB 9.5* 8.4* 8.5* 8.5* 8.2*  HCT 32.0* 27.5* 28.9* 27.8* 26.4*  MCV 83.3 82.8 84.0 82.7 81.5  PLT 286 243 221 207 177   Cardiac Enzymes: No results for input(s): "CKTOTAL", "CKMB", "CKMBINDEX", "TROPONINI" in the last 168 hours. BNP: Invalid input(s): "POCBNP" CBG: Recent Labs  Lab 10/18/22 0713 10/19/22 0744 10/20/22 0650 10/21/22 0748 10/22/22 0744  GLUCAP 119* 93 83 77 133*   D-Dimer No results for input(s): "DDIMER" in the last 72 hours. Hgb A1c No results for input(s): "HGBA1C" in the last 72 hours. Lipid Profile No results for input(s): "CHOL", "HDL", "LDLCALC", "TRIG", "CHOLHDL", "LDLDIRECT" in the last 72 hours. Thyroid function studies No results for input(s): "TSH", "T4TOTAL", "T3FREE", "THYROIDAB" in the last 72 hours.  Invalid input(s): "FREET3" Anemia work up No results for input(s): "VITAMINB12", "FOLATE", "FERRITIN", "TIBC", "IRON", "RETICCTPCT" in the last 72 hours. Urinalysis    Component Value Date/Time   COLORURINE YELLOW 10/09/2017 2150   APPEARANCEUR CLEAR 10/09/2017 2150   LABSPEC 1.012 10/09/2017 2150   PHURINE 7.0 10/09/2017 2150   GLUCOSEU NEGATIVE 10/09/2017 2150   GLUCOSEU NEG  mg/dL 46/96/2952 8413   HGBUR NEGATIVE 10/09/2017 2150   BILIRUBINUR NEGATIVE 10/09/2017 2150   KETONESUR NEGATIVE 10/09/2017 2150   PROTEINUR NEGATIVE 10/09/2017 2150   UROBILINOGEN 1.0 06/25/2009 1224   NITRITE NEGATIVE 10/09/2017 2150   LEUKOCYTESUR NEGATIVE 10/09/2017 2150   Sepsis Labs Recent Labs  Lab 10/18/22 0442 10/19/22 0510 10/20/22 0458 10/21/22 0526  WBC 9.4 8.2 8.9 5.8   Microbiology No results found for this or any previous visit (from the past 240 hour(s)).   Time coordinating discharge: 35 minutes  SIGNED:   Erick Blinks, DO [facility] 10/22/2022, 11:06 AM  If 7PM-7AM, please contact night-coverage [email or web address]

## 2022-10-27 ENCOUNTER — Ambulatory Visit: Admit: 2022-10-27 | Payer: Medicare Other | Admitting: Orthopedic Surgery

## 2022-10-27 DIAGNOSIS — R5381 Other malaise: Secondary | ICD-10-CM | POA: Diagnosis not present

## 2022-10-27 DIAGNOSIS — K56609 Unspecified intestinal obstruction, unspecified as to partial versus complete obstruction: Secondary | ICD-10-CM | POA: Diagnosis not present

## 2022-10-27 SURGERY — ARTHROPLASTY, KNEE, TOTAL
Anesthesia: Choice | Site: Knee | Laterality: Right

## 2022-10-30 DIAGNOSIS — M009 Pyogenic arthritis, unspecified: Secondary | ICD-10-CM | POA: Diagnosis not present

## 2022-10-30 DIAGNOSIS — Z21 Asymptomatic human immunodeficiency virus [HIV] infection status: Secondary | ICD-10-CM | POA: Diagnosis not present

## 2022-10-30 DIAGNOSIS — L7682 Other postprocedural complications of skin and subcutaneous tissue: Secondary | ICD-10-CM | POA: Diagnosis not present

## 2022-10-30 DIAGNOSIS — D649 Anemia, unspecified: Secondary | ICD-10-CM | POA: Diagnosis not present

## 2022-10-30 DIAGNOSIS — K7460 Unspecified cirrhosis of liver: Secondary | ICD-10-CM | POA: Diagnosis not present

## 2022-10-30 DIAGNOSIS — J4489 Other specified chronic obstructive pulmonary disease: Secondary | ICD-10-CM | POA: Diagnosis not present

## 2022-10-30 DIAGNOSIS — B182 Chronic viral hepatitis C: Secondary | ICD-10-CM | POA: Diagnosis not present

## 2022-10-30 DIAGNOSIS — R5381 Other malaise: Secondary | ICD-10-CM | POA: Diagnosis not present

## 2022-11-02 LAB — AEROBIC/ANAEROBIC CULTURE W GRAM STAIN (SURGICAL/DEEP WOUND): Gram Stain: NONE SEEN

## 2022-11-04 DIAGNOSIS — Z471 Aftercare following joint replacement surgery: Secondary | ICD-10-CM | POA: Diagnosis not present

## 2022-11-04 DIAGNOSIS — Z96652 Presence of left artificial knee joint: Secondary | ICD-10-CM | POA: Diagnosis not present

## 2022-11-10 ENCOUNTER — Encounter: Payer: Self-pay | Admitting: Gastroenterology

## 2022-11-10 ENCOUNTER — Ambulatory Visit: Payer: Medicare Other | Admitting: Gastroenterology

## 2022-11-10 VITALS — BP 125/72 | HR 70 | Temp 98.8°F | Ht 68.0 in | Wt 171.2 lb

## 2022-11-10 DIAGNOSIS — K7460 Unspecified cirrhosis of liver: Secondary | ICD-10-CM

## 2022-11-10 DIAGNOSIS — B182 Chronic viral hepatitis C: Secondary | ICD-10-CM

## 2022-11-12 DIAGNOSIS — I7091 Generalized atherosclerosis: Secondary | ICD-10-CM | POA: Diagnosis not present

## 2022-11-13 ENCOUNTER — Other Ambulatory Visit: Payer: Self-pay

## 2022-11-13 ENCOUNTER — Ambulatory Visit (INDEPENDENT_AMBULATORY_CARE_PROVIDER_SITE_OTHER): Payer: Medicare Other | Admitting: Internal Medicine

## 2022-11-13 ENCOUNTER — Encounter: Payer: Self-pay | Admitting: Internal Medicine

## 2022-11-13 VITALS — BP 130/80 | HR 98 | Temp 97.9°F

## 2022-11-13 DIAGNOSIS — T8454XD Infection and inflammatory reaction due to internal left knee prosthesis, subsequent encounter: Secondary | ICD-10-CM

## 2022-11-13 DIAGNOSIS — T8450XD Infection and inflammatory reaction due to unspecified internal joint prosthesis, subsequent encounter: Secondary | ICD-10-CM | POA: Diagnosis not present

## 2022-11-13 DIAGNOSIS — B20 Human immunodeficiency virus [HIV] disease: Secondary | ICD-10-CM

## 2022-11-13 DIAGNOSIS — Z792 Long term (current) use of antibiotics: Secondary | ICD-10-CM

## 2022-11-13 NOTE — Progress Notes (Signed)
Patient ID: Logan Levine, male   DOB: 07-Jan-1951, 72 y.o.   MRN: [id number]  HPI LATRON BINDA is a 72 year old male with extensive history of chronic appetitive C with liver cirrhosis, HIV, anemia, BPH, chronic bronchitis, COPD, GERD,h/o genital herpes, syphilis, HTN, HLD, recent (10/08/22)  left excisional total knee arthroplasty due to infection-on IV antibiotic and Eliquis.  Currently residing in rehab center on IV antibiotics of vancomycin and Eliquis for 19 days.   He recently had exchange arthroplasty insertion of antibiotic spacer for infected left total knee and was on IV antibiotics with vancomycin.  He developed 5 episodes of bloody emesis and was admitted for further evaluation with discontinuation of Eliquis.  He underwent EGD on 9/13 with findings of erosive esophagitis with bleeding and grade 2 esophageal varices that were nonbleeding with retained blood in the duodenum.  He remained on IV Protonix and diet was slowly advanced.  He was also noted to have findings of small bowel obstruction and required NG tube placement subsequently.  He is now tolerating dietary advancement and is having bowel movements.  Hemoglobin appears stable with no further signs of bleeding noted and he will follow-up as noted above.   9/4 culture had corynbacterium  Hospitalized from 9/12-9/18; abtx through 10/16   Needs right knee replacement, has stablelizer  Sees aluisio tomorrow   Spacer is in the left knee Outpatient Encounter Medications as of 11/13/2022  Medication Sig   acetaminophen (TYLENOL) 325 MG tablet Take 325 mg by mouth every 4 (four) hours as needed for mild pain, fever or headache.   albuterol (PROVENTIL HFA;VENTOLIN HFA) 108 (90 Base) MCG/ACT inhaler Inhale 2 puffs into the lungs every 6 (six) hours as needed for wheezing or shortness of breath.   aspirin EC 81 MG tablet Take 81 mg by mouth daily.   carvedilol (COREG) 3.125 MG tablet Take 1 tablet (3.125 mg total) by  mouth 2 (two) times daily with a meal.   emtricitabine-tenofovir AF (DESCOVY) 200-25 MG tablet Take 1 tablet by mouth daily.   Fluticasone Propionate, Inhal, (FLUTICASONE PROPIONATE DISKUS) 100 MCG/ACT AEPB Inhale 1 puff into the lungs in the morning and at bedtime.   heparin 5000 UNIT/ML injection Inject 5,000 Units into the skin every 8 (eight) hours.   Menthol, Topical Analgesic, (BIOFREEZE COOL THE PAIN) 4 % GEL Apply topically.   methocarbamol (ROBAXIN) 500 MG tablet Take 1 tablet (500 mg total) by mouth every 6 (six) hours as needed for muscle spasms.   ondansetron (ZOFRAN) 4 MG tablet Take 1 tablet (4 mg total) by mouth every 6 (six) hours as needed for nausea.   oxyCODONE (OXY IR/ROXICODONE) 5 MG immediate release tablet Take 1-2 tablets (5-10 mg total) by mouth every 6 (six) hours as needed for severe pain.   pantoprazole (PROTONIX) 40 MG tablet Take 1 tablet (40 mg total) by mouth 2 (two) times daily.   polyethylene glycol powder (GLYCOLAX/MIRALAX) 17 GM/SCOOP powder Measure 17 g of powder, mix in 4 oz of water or juice & drink by mouth daily as needed for mild constipation.   raltegravir (ISENTRESS) 400 MG tablet Take 400 mg by mouth 2 (two) times daily.   sulfamethoxazole-trimethoprim (BACTRIM,SEPTRA) 400-80 MG tablet Take 1 tablet by mouth daily.   tamsulosin (FLOMAX) 0.4 MG CAPS capsule Take 0.8 mg by mouth daily.   traMADol (ULTRAM) 50 MG tablet Take 1-2 tablets (50-100 mg total) by mouth every 6 (six) hours as needed for moderate pain.   valACYclovir (  VALTREX) 500 MG tablet Take 500 mg by mouth daily.   vancomycin IVPB Inject 1,250 mg into the vein daily. Indication:  prosthetic joint infection First Dose: Yes Last Day of Therapy:  11/19/22 Labs - Sunday/Monday:  CBC/D, BMP, and vancomycin trough. Labs - Thursday:  BMP and vancomycin trough Labs - Once weekly: ESR and CRP Method of administration:Elastomeric Method of administration may be changed at the discretion of the  patient and/or caregiver's ability to self-administer the medication ordered.   No facility-administered encounter medications on file as of 11/13/2022.     Patient Active Problem List   Diagnosis Date Noted   Upper GI bleed 10/17/2022   Gastrointestinal hemorrhage with hematemesis 10/16/2022   Dementia without behavioral disturbance (HCC) 10/16/2022    Class: Chronic   Small bowel obstruction (HCC) 10/16/2022    Class: Acute   Acute esophagitis 10/16/2022   Secondary esophageal varices without bleeding (HCC) 10/16/2022   Infection of prosthetic joint (HCC) 10/08/2022   Traumatic hematoma of right knee 10/19/2017   Septic arthritis of knee, left (HCC) 05/04/2017   Hepatic cirrhosis due to chronic hepatitis C infection (HCC) 10/29/2016   Constipation 10/29/2016   History of revision of total knee arthroplasty 11/12/2009   KNEE PAIN, ACUTE 11/01/2008   PYELONEPHRITIS, ACUTE 05/28/2007   HERPES, GENITAL NEC 11/19/2006   GOUT NOS 11/19/2006   SMOKER 11/19/2006   HYPERTENSION, BENIGN ESSENTIAL 11/19/2006   BRONCHITIS, CHRONIC NEC 11/19/2006   Umbilical hernia 11/19/2006   HIV DISEASE 07/03/2006   HEPATITIS C 07/03/2006   SYPHILIS, EARLY, SYMPTOMATIC, PRIMARY NEC 07/03/2006   Osteoarthritis 07/03/2006     Health Maintenance Due  Topic Date Due   COVID-19 Vaccine (1) Never done   Zoster Vaccines- Shingrix (1 of 2) Never done   Colonoscopy  Never done   Pneumonia Vaccine 40+ Years old (2 of 2 - PCV) 07/14/2015   DTaP/Tdap/Td (2 - Tdap) 11/18/2016     Review of Systems  Physical Exam   BP 130/80   Pulse 98   Temp 97.9 F (36.6 C) (Oral)   SpO2 100%    Lab Results  Component Value Date   CD4TCELL 19 (L) 05/07/2017   Lab Results  Component Value Date   CD4TABS 140 (L) 05/07/2017   CD4TABS 470 10/29/2009   CD4TABS 760 03/29/2009   Lab Results  Component Value Date   HIV1RNAQUANT <20 05/07/2017   Lab Results  Component Value Date   HEPBSAB No 03/30/2006    Lab Results  Component Value Date   LABRPR NON REAC 03/29/2009    CBC Lab Results  Component Value Date   WBC 5.8 10/21/2022   RBC 3.24 (L) 10/21/2022   HGB 8.2 (L) 10/21/2022   HCT 26.4 (L) 10/21/2022   PLT 177 10/21/2022   MCV 81.5 10/21/2022   MCH 25.3 (L) 10/21/2022   MCHC 31.1 10/21/2022   RDW 19.8 (H) 10/21/2022   LYMPHSABS 0.8 10/09/2017   MONOABS 1.0 10/09/2017   EOSABS 0.2 10/09/2017    BMET Lab Results  Component Value Date   NA 132 (L) 10/21/2022   K 4.1 10/21/2022   CL 108 10/21/2022   CO2 18 (L) 10/21/2022   GLUCOSE 79 10/21/2022   BUN 26 (H) 10/21/2022   CREATININE 0.93 10/21/2022   CALCIUM 8.3 (L) 10/21/2022   GFRNONAA >60 10/21/2022   GFRAA 48 (L) 10/20/2017      Assessment and Plan   Hiv disease = will check hiv viral load; cr.  Pji =  will check sed rate; iv abtx through 10/16. Will check sed rate to see if need to change to orals suppression. Isolate R to doxy. We could do linezolid but may need to see if can do tedizolid as less impact on platelets. Need to see what dr Lequita Halt plans to do  If cr is elevated then need to change to daptomycin

## 2022-11-14 LAB — T-HELPER CELL (CD4) - (RCID CLINIC ONLY)
CD4 % Helper T Cell: 18 % — ABNORMAL LOW (ref 33–65)
CD4 T Cell Abs: 125 /uL — ABNORMAL LOW (ref 400–1790)

## 2022-11-15 LAB — HIV-1 RNA QUANT-NO REFLEX-BLD
HIV 1 RNA Quant: NOT DETECTED {copies}/mL
HIV-1 RNA Quant, Log: NOT DETECTED {Log}

## 2022-11-15 LAB — SEDIMENTATION RATE: Sed Rate: 68 mm/h — ABNORMAL HIGH (ref 0–20)

## 2022-11-15 LAB — CBC WITH DIFFERENTIAL/PLATELET
Absolute Monocytes: 880 {cells}/uL (ref 200–950)
Basophils Absolute: 22 {cells}/uL (ref 0–200)
Basophils Relative: 0.5 %
Eosinophils Absolute: 202 {cells}/uL (ref 15–500)
Eosinophils Relative: 4.6 %
HCT: 27.3 % — ABNORMAL LOW (ref 38.5–50.0)
Hemoglobin: 8.2 g/dL — ABNORMAL LOW (ref 13.2–17.1)
Lymphs Abs: 796 {cells}/uL — ABNORMAL LOW (ref 850–3900)
MCH: 24.6 pg — ABNORMAL LOW (ref 27.0–33.0)
MCHC: 30 g/dL — ABNORMAL LOW (ref 32.0–36.0)
MCV: 81.7 fL (ref 80.0–100.0)
MPV: 11 fL (ref 7.5–12.5)
Monocytes Relative: 20 %
Neutro Abs: 2499 {cells}/uL (ref 1500–7800)
Neutrophils Relative %: 56.8 %
Platelets: 276 10*3/uL (ref 140–400)
RBC: 3.34 10*6/uL — ABNORMAL LOW (ref 4.20–5.80)
RDW: 17.1 % — ABNORMAL HIGH (ref 11.0–15.0)
Total Lymphocyte: 18.1 %
WBC: 4.4 10*3/uL (ref 3.8–10.8)

## 2022-11-15 LAB — COMPLETE METABOLIC PANEL WITH GFR
AG Ratio: 0.6 (calc) — ABNORMAL LOW (ref 1.0–2.5)
ALT: 36 U/L (ref 9–46)
AST: 76 U/L — ABNORMAL HIGH (ref 10–35)
Albumin: 2.9 g/dL — ABNORMAL LOW (ref 3.6–5.1)
Alkaline phosphatase (APISO): 214 U/L — ABNORMAL HIGH (ref 35–144)
BUN: 21 mg/dL (ref 7–25)
CO2: 20 mmol/L (ref 20–32)
Calcium: 8.5 mg/dL — ABNORMAL LOW (ref 8.6–10.3)
Chloride: 108 mmol/L (ref 98–110)
Creat: 0.87 mg/dL (ref 0.70–1.28)
Globulin: 5.2 g/dL — ABNORMAL HIGH (ref 1.9–3.7)
Glucose, Bld: 110 mg/dL — ABNORMAL HIGH (ref 65–99)
Potassium: 4.3 mmol/L (ref 3.5–5.3)
Sodium: 134 mmol/L — ABNORMAL LOW (ref 135–146)
Total Bilirubin: 0.6 mg/dL (ref 0.2–1.2)
Total Protein: 8.1 g/dL (ref 6.1–8.1)
eGFR: 92 mL/min/{1.73_m2} (ref >=60)

## 2022-11-15 LAB — C-REACTIVE PROTEIN: CRP: 4.2 mg/L (ref <8.0)

## 2022-11-17 NOTE — Progress Notes (Signed)
Tammy, this was the patient from last week that staff was unable to get in touch with (I think he is from facility) to reschedule when I had to leave early. He was checked in but not seen. I will close encounter but we need to make sure he gets a follow up visit made.

## 2022-11-18 DIAGNOSIS — B182 Chronic viral hepatitis C: Secondary | ICD-10-CM | POA: Diagnosis not present

## 2022-11-18 DIAGNOSIS — M009 Pyogenic arthritis, unspecified: Secondary | ICD-10-CM | POA: Diagnosis not present

## 2022-11-18 DIAGNOSIS — Z21 Asymptomatic human immunodeficiency virus [HIV] infection status: Secondary | ICD-10-CM | POA: Diagnosis not present

## 2022-11-20 ENCOUNTER — Telehealth: Payer: Self-pay

## 2022-11-20 NOTE — Telephone Encounter (Signed)
I have called and left a message with Amy at the facility to schedule a follow up visit for this pt. Will try back again next week.
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
## 2022-11-24 ENCOUNTER — Encounter: Payer: Self-pay | Admitting: Gastroenterology

## 2022-11-24 ENCOUNTER — Ambulatory Visit (INDEPENDENT_AMBULATORY_CARE_PROVIDER_SITE_OTHER): Payer: Medicare Other | Admitting: Gastroenterology

## 2022-11-24 ENCOUNTER — Telehealth: Payer: Self-pay

## 2022-11-24 VITALS — BP 119/70 | HR 99 | Temp 97.9°F | Ht 68.0 in | Wt 197.0 lb

## 2022-11-24 DIAGNOSIS — K2091 Esophagitis, unspecified with bleeding: Secondary | ICD-10-CM | POA: Diagnosis not present

## 2022-11-24 DIAGNOSIS — K920 Hematemesis: Secondary | ICD-10-CM

## 2022-11-24 DIAGNOSIS — Z8719 Personal history of other diseases of the digestive system: Secondary | ICD-10-CM

## 2022-11-24 DIAGNOSIS — K7460 Unspecified cirrhosis of liver: Secondary | ICD-10-CM | POA: Diagnosis not present

## 2022-11-24 DIAGNOSIS — F1011 Alcohol abuse, in remission: Secondary | ICD-10-CM | POA: Diagnosis not present

## 2022-11-24 DIAGNOSIS — Z8619 Personal history of other infectious and parasitic diseases: Secondary | ICD-10-CM

## 2022-11-24 DIAGNOSIS — K56609 Unspecified intestinal obstruction, unspecified as to partial versus complete obstruction: Secondary | ICD-10-CM

## 2022-11-24 DIAGNOSIS — I8510 Secondary esophageal varices without bleeding: Secondary | ICD-10-CM | POA: Diagnosis not present

## 2022-11-24 DIAGNOSIS — K209 Esophagitis, unspecified without bleeding: Secondary | ICD-10-CM

## 2022-11-24 DIAGNOSIS — B182 Chronic viral hepatitis C: Secondary | ICD-10-CM

## 2022-11-24 NOTE — Telephone Encounter (Signed)
Per Dr. Drue Second patient will continue IV abx for an additional 2 weeks.  Verbal orders given to [facility] with [facility].  Judeth Cornfield verbalized understanding.  Sanae Willetts Jonathon Resides, CMA

## 2022-11-24 NOTE — Telephone Encounter (Signed)
Judeth Cornfield with [facility] called to see if patient will need to extend IV abx or switch to oral.  He currently still has picc line in place and she would like to know if they should remove the picc line.  Until she receives orders she will continue to flush the picc line.  She is re-faxing labs to be sure we have them. Labs have been place in pharmacy box. Judeth Cornfield can be reached at [phone] ext [phone].   She will also fax over notes received from [facility] for the patient.  Secure chat has been sent to Dr. Drue Second requesting treatment plan. Eleanore Junio Jonathon Resides, CMA

## 2022-11-24 NOTE — Patient Instructions (Addendum)
Continue pantoprazole 40 mg twice daily  Continue carvedilol 3.125 mg twice daily.  Continue daily iron therapy.  Cirrhosis Lifestyle Recommendations:  High-protein diet from a primarily plant-based diet. Avoid red meat.  No raw or undercooked meat, seafood, or shellfish. Low-fat/cholesterol/carbohydrate diet. Limit sodium to no more than 2000 mg/day including everything that you eat and drink. Recommend at least 30 minutes of aerobic and resistance exercise 3 days/week. Limit Tylenol to 2000 mg daily.   It was a pleasure to see you today. I want to create trusting relationships with patients. If you receive a survey regarding your visit,  I greatly appreciate you taking time to fill this out on paper or through your MyChart. I value your feedback.  Brooke Bonito, MSN, FNP-BC, AGACNP-BC [facility]

## 2022-11-25 DIAGNOSIS — M109 Gout, unspecified: Secondary | ICD-10-CM | POA: Diagnosis not present

## 2022-11-25 NOTE — Telephone Encounter (Signed)
I also LVM with [facility] medical records requesting patient's last OV note with them.  Logan Levine T Pricilla Loveless

## 2022-11-27 DIAGNOSIS — M109 Gout, unspecified: Secondary | ICD-10-CM | POA: Diagnosis not present

## 2022-11-27 DIAGNOSIS — R5381 Other malaise: Secondary | ICD-10-CM | POA: Diagnosis not present

## 2022-11-27 DIAGNOSIS — B182 Chronic viral hepatitis C: Secondary | ICD-10-CM | POA: Diagnosis not present

## 2022-11-27 DIAGNOSIS — Z21 Asymptomatic human immunodeficiency virus [HIV] infection status: Secondary | ICD-10-CM | POA: Diagnosis not present

## 2022-12-04 ENCOUNTER — Other Ambulatory Visit: Payer: Self-pay

## 2022-12-04 ENCOUNTER — Ambulatory Visit: Payer: Medicare Other | Admitting: Internal Medicine

## 2022-12-04 ENCOUNTER — Telehealth: Payer: Self-pay

## 2022-12-04 VITALS — BP 123/80 | HR 82 | Temp 97.6°F | Wt 197.3 lb

## 2022-12-04 DIAGNOSIS — B20 Human immunodeficiency virus [HIV] disease: Secondary | ICD-10-CM

## 2022-12-04 DIAGNOSIS — T8450XD Infection and inflammatory reaction due to unspecified internal joint prosthesis, subsequent encounter: Secondary | ICD-10-CM

## 2022-12-04 DIAGNOSIS — T8454XD Infection and inflammatory reaction due to internal left knee prosthesis, subsequent encounter: Secondary | ICD-10-CM | POA: Diagnosis not present

## 2022-12-04 LAB — LAB REPORT - SCANNED: EGFR: 90

## 2022-12-04 MED ORDER — BICTEGRAVIR-EMTRICITAB-TENOFOV 50-200-25 MG PO TABS: 1.0000 | ORAL_TABLET | Freq: Every day | ORAL | 11 refills | Status: DC

## 2022-12-04 MED ORDER — SULFAMETHOXAZOLE-TRIMETHOPRIM 400-80 MG PO TABS: 1.0000 | ORAL_TABLET | Freq: Every day | ORAL | 6 refills | Status: DC

## 2022-12-04 NOTE — Telephone Encounter (Signed)
Per Dr. Drue Second called [facility] to requesting medical records from Dr. Gerri Spore. Left voicemail requesting call back.  Juanita Laster, RMA

## 2022-12-04 NOTE — Progress Notes (Signed)
RFV: follow up for left knee pji  Patient ID: Logan Levine, male   DOB: [birth year], 72 y.o.   MRN: [id number]  HPI Logan Levine is a 71yo M with HIV disease, Isentress and descovy. Cd 4 count of 184/VL<20 in oct 2024. But also receiving IV vancomycin for Pji of left knee with corynebacterium (S to vanco, S dapto S linezolid, R doxy) s/p Left knee resection arthroplasty and antibiotic spacer placement on 10/08/22  Cr 0.84 this week   Hiv disease -followed at [facility]  Outpatient Encounter Medications as of 12/04/2022  Medication Sig   acetaminophen (TYLENOL) 325 MG tablet Take 325 mg by mouth every 4 (four) hours as needed for mild pain, fever or headache.   albuterol (PROVENTIL HFA;VENTOLIN HFA) 108 (90 Base) MCG/ACT inhaler Inhale 2 puffs into the lungs every 6 (six) hours as needed for wheezing or shortness of breath.   ascorbic acid (VITAMIN C) 500 MG tablet Take 500 mg by mouth daily.   aspirin EC 81 MG tablet Take 81 mg by mouth daily.   carvedilol (COREG) 3.125 MG tablet Take 1 tablet (3.125 mg total) by mouth 2 (two) times daily with a meal.   emtricitabine-tenofovir AF (DESCOVY) 200-25 MG tablet Take 1 tablet by mouth daily.   ferrous sulfate 325 (65 FE) MG EC tablet Take 325 mg by mouth in the morning and at bedtime.   Fluticasone Propionate, Inhal, (FLUTICASONE PROPIONATE DISKUS) 100 MCG/ACT AEPB Inhale 1 puff into the lungs in the morning and at bedtime.   Menthol, Topical Analgesic, (BIOFREEZE COOL THE PAIN) 4 % GEL Apply topically.   methocarbamol (ROBAXIN) 500 MG tablet Take 1 tablet (500 mg total) by mouth every 6 (six) hours as needed for muscle spasms.   ondansetron (ZOFRAN) 4 MG tablet Take 1 tablet (4 mg total) by mouth every 6 (six) hours as needed for nausea.   oxyCODONE (OXY IR/ROXICODONE) 5 MG immediate release tablet Take 1-2 tablets (5-10 mg total) by mouth every 6 (six) hours as needed for severe pain. (Patient not taking: Reported on 11/24/2022)   pantoprazole  (PROTONIX) 40 MG tablet Take 1 tablet (40 mg total) by mouth 2 (two) times daily.   polyethylene glycol powder (GLYCOLAX/MIRALAX) 17 GM/SCOOP powder Measure 17 g of powder, mix in 4 oz of water or juice & drink by mouth daily as needed for mild constipation.   raltegravir (ISENTRESS) 400 MG tablet Take 400 mg by mouth 2 (two) times daily.   tamsulosin (FLOMAX) 0.4 MG CAPS capsule Take 0.8 mg by mouth daily.   traMADol (ULTRAM) 50 MG tablet Take 1-2 tablets (50-100 mg total) by mouth every 6 (six) hours as needed for moderate pain.   valACYclovir (VALTREX) 500 MG tablet Take 500 mg by mouth daily.   No facility-administered encounter medications on file as of 12/04/2022.     Patient Active Problem List   Diagnosis Date Noted   Upper GI bleed 10/17/2022   Gastrointestinal hemorrhage with hematemesis 10/16/2022   Dementia without behavioral disturbance (HCC) 10/16/2022    Class: Chronic   Small bowel obstruction (HCC) 10/16/2022    Class: Acute   Acute esophagitis 10/16/2022   Secondary esophageal varices without bleeding (HCC) 10/16/2022   Infection of prosthetic joint (HCC) 10/08/2022   Traumatic hematoma of right knee 10/19/2017   Septic arthritis of knee, left (HCC) 05/04/2017   Hepatic cirrhosis due to chronic hepatitis C infection (HCC) 10/29/2016   Constipation 10/29/2016   History of revision of total knee arthroplasty  11/12/2009   KNEE PAIN, ACUTE 11/01/2008   PYELONEPHRITIS, ACUTE 05/28/2007   HERPES, GENITAL NEC 11/19/2006   GOUT NOS 11/19/2006   SMOKER 11/19/2006   HYPERTENSION, BENIGN ESSENTIAL 11/19/2006   BRONCHITIS, CHRONIC NEC 11/19/2006   Umbilical hernia 11/19/2006   HIV DISEASE 07/03/2006   HEPATITIS C 07/03/2006   SYPHILIS, EARLY, SYMPTOMATIC, PRIMARY NEC 07/03/2006   Osteoarthritis 07/03/2006     Health Maintenance Due  Topic Date Due   COVID-19 Vaccine (1) Never done   Zoster Vaccines- Shingrix (1 of 2) Never done   Colonoscopy  Never done   Pneumonia  Vaccine 48+ Years old (2 of 2 - PCV) 07/14/2015   DTaP/Tdap/Td (2 - Tdap) 11/18/2016     Review of Systems  Physical Exam   BP 123/80   Pulse 82   Temp 97.6 F (36.4 C) (Oral)    Lab Results  Component Value Date   CD4TCELL 18 (L) 11/13/2022   Lab Results  Component Value Date   CD4TABS 125 (L) 11/13/2022   CD4TABS 140 (L) 05/07/2017   CD4TABS 470 10/29/2009   Lab Results  Component Value Date   HIV1RNAQUANT Not Detected 11/13/2022   Lab Results  Component Value Date   HEPBSAB No 03/30/2006   Lab Results  Component Value Date   LABRPR NON REAC 03/29/2009    CBC Lab Results  Component Value Date   WBC 4.4 11/13/2022   RBC 3.34 (L) 11/13/2022   HGB 8.2 (L) 11/13/2022   HCT 27.3 (L) 11/13/2022   PLT 276 11/13/2022   MCV 81.7 11/13/2022   MCH 24.6 (L) 11/13/2022   MCHC 30.0 (L) 11/13/2022   RDW 17.1 (H) 11/13/2022   LYMPHSABS 796 (L) 11/13/2022   MONOABS 1.0 10/09/2017   EOSABS 202 11/13/2022    BMET Lab Results  Component Value Date   NA 134 (L) 11/13/2022   K 4.3 11/13/2022   CL 108 11/13/2022   CO2 20 11/13/2022   GLUCOSE 110 (H) 11/13/2022   BUN 21 11/13/2022   CREATININE 0.87 11/13/2022   CALCIUM 8.5 (L) 11/13/2022   GFRNONAA >60 10/21/2022   GFRAA 48 (L) 10/20/2017      Assessment and Plan Hiv disease = Will change hiv meds to biktarvy daily for ease (1 pill) then can stop descovy and stop raltegravir; needs bactrim ss daily for oi proph  Plan to extend iv abtx - Vanco to stop  on 12/3 extended for a total of 3 mos since his last surgery, we will look into insurance coverage for tedizolid. however only linezolid is the oral agent to convert to.   -tedizolid to finish out remaining course (3 more months for a total of 6 months since surgery on 10/08/22); unclear how we'll be able to do chronic suppression given would need close follow up of platelets while on tedizolid.Marland Kitchen and insurance coverage  - will need get notes from [facility] to see  if they are doing new implant or is abtx spacer permanent.  - hx of chronic hep c = will discuss treatment options at next visit or defer to his hiv provider at [facility]

## 2022-12-25 DIAGNOSIS — H524 Presbyopia: Secondary | ICD-10-CM | POA: Diagnosis not present

## 2022-12-25 DIAGNOSIS — H2513 Age-related nuclear cataract, bilateral: Secondary | ICD-10-CM | POA: Diagnosis not present

## 2022-12-25 DIAGNOSIS — H18413 Arcus senilis, bilateral: Secondary | ICD-10-CM | POA: Diagnosis not present

## 2022-12-25 DIAGNOSIS — H11153 Pinguecula, bilateral: Secondary | ICD-10-CM | POA: Diagnosis not present

## 2023-01-05 ENCOUNTER — Ambulatory Visit: Payer: Medicare Other | Admitting: Internal Medicine

## 2023-01-05 ENCOUNTER — Encounter: Payer: Self-pay | Admitting: Internal Medicine

## 2023-01-05 ENCOUNTER — Other Ambulatory Visit: Payer: Self-pay

## 2023-01-05 ENCOUNTER — Telehealth: Payer: Self-pay

## 2023-01-05 VITALS — BP 133/85 | HR 72 | Temp 98.3°F

## 2023-01-05 DIAGNOSIS — T8454XA Infection and inflammatory reaction due to internal left knee prosthesis, initial encounter: Secondary | ICD-10-CM | POA: Diagnosis not present

## 2023-01-05 DIAGNOSIS — T8450XD Infection and inflammatory reaction due to unspecified internal joint prosthesis, subsequent encounter: Secondary | ICD-10-CM

## 2023-01-05 DIAGNOSIS — K219 Gastro-esophageal reflux disease without esophagitis: Secondary | ICD-10-CM | POA: Diagnosis not present

## 2023-01-05 DIAGNOSIS — B20 Human immunodeficiency virus [HIV] disease: Secondary | ICD-10-CM | POA: Diagnosis not present

## 2023-01-05 DIAGNOSIS — Z792 Long term (current) use of antibiotics: Secondary | ICD-10-CM | POA: Diagnosis not present

## 2023-01-05 DIAGNOSIS — M009 Pyogenic arthritis, unspecified: Secondary | ICD-10-CM | POA: Diagnosis not present

## 2023-01-05 DIAGNOSIS — J4489 Other specified chronic obstructive pulmonary disease: Secondary | ICD-10-CM | POA: Diagnosis not present

## 2023-01-05 MED ORDER — LINEZOLID 600 MG PO TABS: 600.0000 mg | ORAL_TABLET | Freq: Two times a day (BID) | ORAL | 0 refills | Status: DC

## 2023-01-05 NOTE — Progress Notes (Unsigned)
Patient ID: Logan Levine, male   DOB: 1950/09/11, 72 y.o.   MRN: [id number]  HPI Left knee resection arthroplasty and antibiotic spacer placement. On 9/4 -- has been on 3 months of iv vancomycin for corynebacterium.   The patient, with a history of left knee infection, HIV, and a low CD4 count, presents with ongoing left knee pain. He has been receiving treatment for the knee infection for approximately three months. The patient is unsure of the future treatment plan, including the possibility of repeat surgery or continued use of an antibiotic spacer. He also mentions the use of a brace for his right knee, which he obtained independently.  The patient reports no recent fevers or chills, and denies any current diarrhea, although he did experience this symptom approximately a month ago while on antibiotics. He reports no current drainage from the knee, but there is one area that is slow to heal.  Regarding his HIV status, the patient's recent labs showed a CD4 count of 125 and an undetectable viral load. He is currently on antiretroviral therapy and an additional antibiotic to prevent pneumonia due to the low CD4 count. The patient is under the care of a health aide who assists with medication management and communication with healthcare providers.  LABS CD4 count: 125 Viral load: undetectable Outpatient Encounter Medications as of 01/05/2023  Medication Sig   acetaminophen (TYLENOL) 325 MG tablet Take 325 mg by mouth every 4 (four) hours as needed for mild pain, fever or headache.   albuterol (PROVENTIL HFA;VENTOLIN HFA) 108 (90 Base) MCG/ACT inhaler Inhale 2 puffs into the lungs every 6 (six) hours as needed for wheezing or shortness of breath.   ascorbic acid (VITAMIN C) 500 MG tablet Take 500 mg by mouth daily.   aspirin EC 81 MG tablet Take 81 mg by mouth daily.   bictegravir-emtricitabine-tenofovir AF (BIKTARVY) 50-200-25 MG TABS tablet Take 1 tablet by mouth daily.   carvedilol  (COREG) 3.125 MG tablet Take 1 tablet (3.125 mg total) by mouth 2 (two) times daily with a meal.   ferrous sulfate 325 (65 FE) MG EC tablet Take 325 mg by mouth in the morning and at bedtime.   Fluticasone Propionate, Inhal, (FLUTICASONE PROPIONATE DISKUS) 100 MCG/ACT AEPB Inhale 1 puff into the lungs in the morning and at bedtime.   Menthol, Topical Analgesic, (BIOFREEZE COOL THE PAIN) 4 % GEL Apply topically.   methocarbamol (ROBAXIN) 500 MG tablet Take 1 tablet (500 mg total) by mouth every 6 (six) hours as needed for muscle spasms.   ondansetron (ZOFRAN) 4 MG tablet Take 1 tablet (4 mg total) by mouth every 6 (six) hours as needed for nausea.   oxyCODONE (OXY IR/ROXICODONE) 5 MG immediate release tablet Take 1-2 tablets (5-10 mg total) by mouth every 6 (six) hours as needed for severe pain.   pantoprazole (PROTONIX) 40 MG tablet Take 1 tablet (40 mg total) by mouth 2 (two) times daily.   polyethylene glycol powder (GLYCOLAX/MIRALAX) 17 GM/SCOOP powder Measure 17 g of powder, mix in 4 oz of water or juice & drink by mouth daily as needed for mild constipation.   sulfamethoxazole-trimethoprim (BACTRIM) 400-80 MG tablet Take 1 tablet by mouth daily.   tamsulosin (FLOMAX) 0.4 MG CAPS capsule Take 0.8 mg by mouth daily.   traMADol (ULTRAM) 50 MG tablet Take 1-2 tablets (50-100 mg total) by mouth every 6 (six) hours as needed for moderate pain.   valACYclovir (VALTREX) 500 MG tablet Take 500 mg by  mouth daily.   No facility-administered encounter medications on file as of 01/05/2023.     Patient Active Problem List   Diagnosis Date Noted   Upper GI bleed 10/17/2022   Gastrointestinal hemorrhage with hematemesis 10/16/2022   Dementia without behavioral disturbance (HCC) 10/16/2022    Class: Chronic   Small bowel obstruction (HCC) 10/16/2022    Class: Acute   Acute esophagitis 10/16/2022   Secondary esophageal varices without bleeding (HCC) 10/16/2022   Infection of prosthetic joint (HCC)  10/08/2022   Traumatic hematoma of right knee 10/19/2017   Septic arthritis of knee, left (HCC) 05/04/2017   Hepatic cirrhosis due to chronic hepatitis C infection (HCC) 10/29/2016   Constipation 10/29/2016   History of revision of total knee arthroplasty 11/12/2009   KNEE PAIN, ACUTE 11/01/2008   PYELONEPHRITIS, ACUTE 05/28/2007   HERPES, GENITAL NEC 11/19/2006   GOUT NOS 11/19/2006   SMOKER 11/19/2006   HYPERTENSION, BENIGN ESSENTIAL 11/19/2006   BRONCHITIS, CHRONIC NEC 11/19/2006   Umbilical hernia 11/19/2006   HIV DISEASE 07/03/2006   HEPATITIS C 07/03/2006   SYPHILIS, EARLY, SYMPTOMATIC, PRIMARY NEC 07/03/2006   Osteoarthritis 07/03/2006     Health Maintenance Due  Topic Date Due   COVID-19 Vaccine (1) Never done   Zoster Vaccines- Shingrix (1 of 2) Never done   Colonoscopy  Never done   Pneumonia Vaccine 84+ Years old (2 of 2 - PCV) 07/14/2015   DTaP/Tdap/Td (2 - Tdap) 11/18/2016     Review of Systems Wearing right knee brace due to arthritis pain. Still using foam imobilizer on left knee pji Physical Exam   BP 133/85   Pulse 72   Temp 98.3 F (36.8 C) (Temporal)   SpO2 95%   Gen = a xo by 3 in nad HEENT = PERRLA EOMI no scleral icterus Pulm = CTAB no w/c/r Cors= RRR. No g/m/r Ext = left knee small dehiscence at distal aspect of incision Lab Results  Component Value Date   CD4TCELL 18 (L) 11/13/2022   Lab Results  Component Value Date   CD4TABS 125 (L) 11/13/2022   CD4TABS 140 (L) 05/07/2017   CD4TABS 470 10/29/2009   Lab Results  Component Value Date   HIV1RNAQUANT Not Detected 11/13/2022   Lab Results  Component Value Date   HEPBSAB No 03/30/2006   Lab Results  Component Value Date   LABRPR NON REAC 03/29/2009    CBC Lab Results  Component Value Date   WBC 4.4 11/13/2022   RBC 3.34 (L) 11/13/2022   HGB 8.2 (L) 11/13/2022   HCT 27.3 (L) 11/13/2022   PLT 276 11/13/2022   MCV 81.7 11/13/2022   MCH 24.6 (L) 11/13/2022   MCHC 30.0 (L)  11/13/2022   RDW 17.1 (H) 11/13/2022   LYMPHSABS 796 (L) 11/13/2022   MONOABS 1.0 10/09/2017   EOSABS 202 11/13/2022    BMET Lab Results  Component Value Date   NA 134 (L) 11/13/2022   K 4.3 11/13/2022   CL 108 11/13/2022   CO2 20 11/13/2022   GLUCOSE 110 (H) 11/13/2022   BUN 21 11/13/2022   CREATININE 0.87 11/13/2022   CALCIUM 8.5 (L) 11/13/2022   GFRNONAA >60 10/21/2022   GFRAA 48 (L) 10/20/2017   Lab Results  Component Value Date   ESRSEDRATE 68 (H) 11/13/2022   Lab Results  Component Value Date   CRP 4.2 11/13/2022       Assessment and Plan Left Knee Infection Chronic infection of the left knee, currently treated with IV  antibiotics for three months. Reports pain and slow healing with some redness but no drainage. No recent fevers or chills. Will extend iv vanco until 12/14 then switch to 2 wk of oral linezolid due to slow healing. Importance of follow-up with Dr. Despina Hick for further evaluation and management emphasized. - Follow up with Dr. Despina Hick for further evaluation and management of incisional dehiscence - inflammatory markers still elevated despite 3 months of treatemnt  Right Knee Pain Uses a brace for the right knee. No current plans for knee replacement surgery. Need to address both knees during follow-up with Dr. Kemper Durie discussed. - Discuss right knee management with Dr. Despina Hick during follow-up  HIV HIV management is well-controlled with a CD4 count of 125 and an undetectable viral load. On antiretroviral therapy and prophylactic antibiotics due to low CD4 count. Necessity of continuing prophylactic antibiotics until CD4 count is above 200 to prevent pneumonia explained. - Continue antiretroviral therapy - Continue prophylactic bactrim  until CD4 count is above 200 for 3 months  General Health Maintenance No new issues identified. No oral thrush observed. - Monitor for new symptoms or changes in health status - uptodate on covid and flu  vaccine  Follow-up  - Update Chandra Batch on medical status and plan.

## 2023-01-05 NOTE — Telephone Encounter (Signed)
Per Dr. Drue Second extend patient IV abx till 12/14 and then finish 2 weeks of oral abx linezoild. Called [facility] and gave verbal order to nurse Baxter Hire who read back order and verbalized understanding.

## 2023-01-06 LAB — BASIC METABOLIC PANEL
BUN/Creatinine Ratio: 30 (calc) — ABNORMAL HIGH (ref 6–22)
BUN: 28 mg/dL — ABNORMAL HIGH (ref 7–25)
CO2: 23 mmol/L (ref 20–32)
Calcium: 9.5 mg/dL (ref 8.6–10.3)
Chloride: 107 mmol/L (ref 98–110)
Creat: 0.92 mg/dL (ref 0.70–1.28)
Glucose, Bld: 108 mg/dL — ABNORMAL HIGH (ref 65–99)
Potassium: 4.5 mmol/L (ref 3.5–5.3)
Sodium: 135 mmol/L (ref 135–146)

## 2023-01-06 LAB — CBC WITH DIFFERENTIAL/PLATELET
Absolute Lymphocytes: 813 {cells}/uL — ABNORMAL LOW (ref 850–3900)
Absolute Monocytes: 783 {cells}/uL (ref 200–950)
Basophils Absolute: 30 {cells}/uL (ref 0–200)
Basophils Relative: 0.7 %
Eosinophils Absolute: 267 {cells}/uL (ref 15–500)
Eosinophils Relative: 6.2 %
HCT: 38.4 % — ABNORMAL LOW (ref 38.5–50.0)
Hemoglobin: 12.6 g/dL — ABNORMAL LOW (ref 13.2–17.1)
MCH: 29.3 pg (ref 27.0–33.0)
MCHC: 32.8 g/dL (ref 32.0–36.0)
MCV: 89.3 fL (ref 80.0–100.0)
MPV: 11.1 fL (ref 7.5–12.5)
Monocytes Relative: 18.2 %
Neutro Abs: 2408 {cells}/uL (ref 1500–7800)
Neutrophils Relative %: 56 %
Platelets: 185 10*3/uL (ref 140–400)
RBC: 4.3 10*6/uL (ref 4.20–5.80)
RDW: 19 % — ABNORMAL HIGH (ref 11.0–15.0)
Total Lymphocyte: 18.9 %
WBC: 4.3 10*3/uL (ref 3.8–10.8)

## 2023-01-06 LAB — SEDIMENTATION RATE: Sed Rate: 50 mm/h — ABNORMAL HIGH (ref 0–20)

## 2023-01-06 LAB — C-REACTIVE PROTEIN: CRP: <3 mg/L (ref <8.0)

## 2023-01-07 DIAGNOSIS — Z21 Asymptomatic human immunodeficiency virus [HIV] infection status: Secondary | ICD-10-CM | POA: Diagnosis not present

## 2023-01-07 DIAGNOSIS — H903 Sensorineural hearing loss, bilateral: Secondary | ICD-10-CM | POA: Diagnosis not present

## 2023-01-07 DIAGNOSIS — L7682 Other postprocedural complications of skin and subcutaneous tissue: Secondary | ICD-10-CM | POA: Diagnosis not present

## 2023-01-07 DIAGNOSIS — M009 Pyogenic arthritis, unspecified: Secondary | ICD-10-CM | POA: Diagnosis not present

## 2023-01-08 DIAGNOSIS — M009 Pyogenic arthritis, unspecified: Secondary | ICD-10-CM | POA: Diagnosis not present

## 2023-01-08 DIAGNOSIS — L7682 Other postprocedural complications of skin and subcutaneous tissue: Secondary | ICD-10-CM | POA: Diagnosis not present

## 2023-01-08 DIAGNOSIS — Z21 Asymptomatic human immunodeficiency virus [HIV] infection status: Secondary | ICD-10-CM | POA: Diagnosis not present

## 2023-01-13 ENCOUNTER — Telehealth: Payer: Self-pay

## 2023-01-13 ENCOUNTER — Other Ambulatory Visit: Payer: Self-pay

## 2023-01-13 MED ORDER — BICTEGRAVIR-EMTRICITAB-TENOFOV 50-200-25 MG PO TABS: 1.0000 | ORAL_TABLET | Freq: Every day | ORAL | 5 refills | Status: DC

## 2023-01-13 NOTE — Telephone Encounter (Signed)
Received call today from Ardine Eng with [facility] regarding patient's ART. Per RN patient has been switched from [place] to Descovy by nursing facility due to cost. Rn is not able to tell me how long patient has been on Descovy.  Would like for office to send new rx for Biktarvy to [facility] so medication can be delivered, and not filled at [facility] pharmacy. Rn would also like to know if provider is okay with continuing descovy alone or holding off until [place] is mailed to facility.  Per MD okay to continue Descovy until Susanne Borders is mailed out.  [facility].  Requested RN call [facility] today to have medication mailed out. Juanita Laster, RMA

## 2023-01-21 ENCOUNTER — Encounter: Payer: Self-pay | Admitting: Gastroenterology

## 2023-02-05 DIAGNOSIS — M009 Pyogenic arthritis, unspecified: Secondary | ICD-10-CM | POA: Diagnosis not present

## 2023-02-05 DIAGNOSIS — D649 Anemia, unspecified: Secondary | ICD-10-CM | POA: Diagnosis not present

## 2023-02-05 DIAGNOSIS — Z21 Asymptomatic human immunodeficiency virus [HIV] infection status: Secondary | ICD-10-CM | POA: Diagnosis not present

## 2023-02-05 DIAGNOSIS — B182 Chronic viral hepatitis C: Secondary | ICD-10-CM | POA: Diagnosis not present

## 2023-02-05 DIAGNOSIS — M1732 Unilateral post-traumatic osteoarthritis, left knee: Secondary | ICD-10-CM | POA: Diagnosis not present

## 2023-02-05 DIAGNOSIS — K219 Gastro-esophageal reflux disease without esophagitis: Secondary | ICD-10-CM | POA: Diagnosis not present

## 2023-02-09 ENCOUNTER — Other Ambulatory Visit: Payer: Self-pay

## 2023-02-09 ENCOUNTER — Ambulatory Visit (INDEPENDENT_AMBULATORY_CARE_PROVIDER_SITE_OTHER): Payer: Medicare Other | Admitting: Internal Medicine

## 2023-02-09 ENCOUNTER — Telehealth: Payer: Self-pay

## 2023-02-09 ENCOUNTER — Other Ambulatory Visit (HOSPITAL_COMMUNITY): Payer: Self-pay

## 2023-02-09 VITALS — BP 131/84 | HR 95 | Temp 99.0°F

## 2023-02-09 DIAGNOSIS — T8454XD Infection and inflammatory reaction due to internal left knee prosthesis, subsequent encounter: Secondary | ICD-10-CM

## 2023-02-09 DIAGNOSIS — B20 Human immunodeficiency virus [HIV] disease: Secondary | ICD-10-CM | POA: Diagnosis not present

## 2023-02-09 DIAGNOSIS — Z792 Long term (current) use of antibiotics: Secondary | ICD-10-CM | POA: Diagnosis not present

## 2023-02-09 DIAGNOSIS — T8450XD Infection and inflammatory reaction due to unspecified internal joint prosthesis, subsequent encounter: Secondary | ICD-10-CM

## 2023-02-09 NOTE — Progress Notes (Signed)
 Patient ID: Logan Levine, male   DOB: August 14, 1950, 73 y.o.   MRN: [id number]  HPI   Vincenzo is a 73yo M with HIV disease, biktarvy  Cd 4 count of 184/VL<20 in oct 2024. But also receiving IV vancomycin  for Pji of left knee with corynebacterium (S to vanco, S dapto S linezolid , R doxy) s/p Left knee resection arthroplasty and antibiotic spacer placement on 10/08/22   Still at [facility] rehab. His left knee is finally healing (see picture in media) he has protective dressing in place Outpatient Encounter Medications as of 02/09/2023  Medication Sig   acetaminophen  (TYLENOL ) 325 MG tablet Take 325 mg by mouth every 4 (four) hours as needed for mild pain, fever or headache.   albuterol  (PROVENTIL  HFA;VENTOLIN  HFA) 108 (90 Base) MCG/ACT inhaler Inhale 2 puffs into the lungs every 6 (six) hours as needed for wheezing or shortness of breath.   ascorbic acid (VITAMIN C) 500 MG tablet Take 500 mg by mouth daily.   aspirin  EC 81 MG tablet Take 81 mg by mouth daily.   bictegravir-emtricitabine -tenofovir  AF (BIKTARVY ) 50-200-25 MG TABS tablet Take 1 tablet by mouth daily.   carvedilol  (COREG ) 3.125 MG tablet Take 1 tablet (3.125 mg total) by mouth 2 (two) times daily with a meal.   ferrous sulfate  325 (65 FE) MG EC tablet Take 325 mg by mouth in the morning and at bedtime.   Fluticasone  Propionate, Inhal, (FLUTICASONE  PROPIONATE DISKUS) 100 MCG/ACT AEPB Inhale 1 puff into the lungs in the morning and at bedtime.   linezolid  (ZYVOX ) 600 MG tablet Take 1 tablet (600 mg total) by mouth 2 (two) times daily. Start on 12/15 after finishing iv vancomycin    Menthol , Topical Analgesic, (BIOFREEZE COOL THE PAIN) 4 % GEL Apply topically.   methocarbamol  (ROBAXIN ) 500 MG tablet Take 1 tablet (500 mg total) by mouth every 6 (six) hours as needed for muscle spasms.   ondansetron  (ZOFRAN ) 4 MG tablet Take 1 tablet (4 mg total) by mouth every 6 (six) hours as needed for nausea.   oxyCODONE  (OXY IR/ROXICODONE ) 5 MG immediate  release tablet Take 1-2 tablets (5-10 mg total) by mouth every 6 (six) hours as needed for severe pain.   pantoprazole  (PROTONIX ) 40 MG tablet Take 1 tablet (40 mg total) by mouth 2 (two) times daily.   polyethylene glycol powder (GLYCOLAX /MIRALAX ) 17 GM/SCOOP powder Measure 17 g of powder, mix in 4 oz of water  or juice & drink by mouth daily as needed for mild constipation.   sulfamethoxazole -trimethoprim  (BACTRIM ) 400-80 MG tablet Take 1 tablet by mouth daily.   tamsulosin  (FLOMAX ) 0.4 MG CAPS capsule Take 0.8 mg by mouth daily.   traMADol  (ULTRAM ) 50 MG tablet Take 1-2 tablets (50-100 mg total) by mouth every 6 (six) hours as needed for moderate pain.   valACYclovir  (VALTREX ) 500 MG tablet Take 500 mg by mouth daily.   No facility-administered encounter medications on file as of 02/09/2023.     Patient Active Problem List   Diagnosis Date Noted   Upper GI bleed 10/17/2022   Gastrointestinal hemorrhage with hematemesis 10/16/2022   Dementia without behavioral disturbance (HCC) 10/16/2022    Class: Chronic   Small bowel obstruction (HCC) 10/16/2022    Class: Acute   Acute esophagitis 10/16/2022   Secondary esophageal varices without bleeding (HCC) 10/16/2022   Infection of prosthetic joint (HCC) 10/08/2022   Traumatic hematoma of right knee 10/19/2017   Septic arthritis of knee, left (HCC) 05/04/2017   Hepatic cirrhosis due  to chronic hepatitis C infection (HCC) 10/29/2016   Constipation 10/29/2016   History of revision of total knee arthroplasty 11/12/2009   KNEE PAIN, ACUTE 11/01/2008   PYELONEPHRITIS, ACUTE 05/28/2007   HERPES, GENITAL NEC 11/19/2006   GOUT NOS 11/19/2006   SMOKER 11/19/2006   HYPERTENSION, BENIGN ESSENTIAL 11/19/2006   BRONCHITIS, CHRONIC NEC 11/19/2006   Umbilical hernia 11/19/2006   HIV DISEASE 07/03/2006   HEPATITIS C 07/03/2006   SYPHILIS, EARLY, SYMPTOMATIC, PRIMARY NEC 07/03/2006   Osteoarthritis 07/03/2006     Health Maintenance Due  Topic Date  Due   COVID-19 Vaccine (1) Never done   Zoster Vaccines- Shingrix (1 of 2) Never done   Colonoscopy  Never done   Pneumonia Vaccine 69+ Years old (2 of 2 - PCV) 12/16/2006   DTaP/Tdap/Td (2 - Tdap) 11/18/2016   Medicare Annual Wellness (AWV)  03/14/2023     Review of Systems 12 point ros is negative except what is mentioned in hpi Physical Exam   BP 131/84   Pulse 95   Temp 99 F (37.2 C) (Oral)   SpO2 98%   Physical Exam  Constitutional: He is oriented to person, place, and time. He appears well-developed and well-nourished. No distress.  HENT:  Mouth/Throat: Oropharynx is clear and moist. No oropharyngeal exudate.  Cardiovascular: Normal rate, regular rhythm and normal heart sounds. Exam reveals no gallop and no friction rub.  No murmur heard.  Pulmonary/Chest: Effort normal and breath sounds normal. No respiratory distress. He has no wheezes.  Abdominal: Soft. Bowel sounds are normal. He exhibits no distension. There is no tenderness.  Lymphadenopathy:  He has no cervical adenopathy.  Neurological: He is alert and oriented to person, place, and time.  Skin: Skin is warm and dry. No rash noted. No erythema.  Psychiatric: He has a normal mood and affect. His behavior is normal.   Lab Results  Component Value Date   CD4TCELL 18 (L) 11/13/2022   Lab Results  Component Value Date   CD4TABS 125 (L) 11/13/2022   CD4TABS 140 (L) 05/07/2017   CD4TABS 470 10/29/2009   Lab Results  Component Value Date   HIV1RNAQUANT Not Detected 11/13/2022   Lab Results  Component Value Date   HEPBSAB No 03/30/2006   Lab Results  Component Value Date   LABRPR NON REAC 03/29/2009    CBC Lab Results  Component Value Date   WBC 4.3 01/05/2023   RBC 4.30 01/05/2023   HGB 12.6 (L) 01/05/2023   HCT 38.4 (L) 01/05/2023   PLT 185 01/05/2023   MCV 89.3 01/05/2023   MCH 29.3 01/05/2023   MCHC 32.8 01/05/2023   RDW 19.0 (H) 01/05/2023   LYMPHSABS 796 (L) 11/13/2022   MONOABS 1.0  10/09/2017   EOSABS 267 01/05/2023    BMET Lab Results  Component Value Date   NA 135 01/05/2023   K 4.5 01/05/2023   CL 107 01/05/2023   CO2 23 01/05/2023   GLUCOSE 108 (H) 01/05/2023   BUN 28 (H) 01/05/2023   CREATININE 0.92 01/05/2023   CALCIUM 9.5 01/05/2023   GFRNONAA >60 10/21/2022   GFRAA 48 (L) 10/20/2017      Assessment and Plan Hiv disease= Will get hiv labs  Long term medication management= Check cr function while on bactrim  for oi proph  Pji = needs to be on tedizolid for 3 more months. Trying to get PA authorization Stays at [facility] rehab  After completion of pji will then address hep c treamtent  Uptodate on  vaccines PCV 13 on 9/24 Covid vax on 9/24

## 2023-02-09 NOTE — Telephone Encounter (Signed)
 [facility] Patient Advocate Encounter   Received notification from [facility] that prior authorization for Sivextro is required.   PA submitted on 02/09/23 Key [id number] Status is pending    [facility] will continue to follow.   Arland Hutchinson, CPhT Specialty Pharmacy Patient [facility] Phone: [phone] Fax:  [phone]

## 2023-02-09 NOTE — Telephone Encounter (Signed)
 [facility] Patient Advocate Encounter  Prior Authorization for Sivextro has been approved.    PA# [id number] Effective dates: 02/09/23 through 02/02/2098  Patients co-pay is $4.80.   Prescription can be filled at [facility].  [facility] will continue to follow.  Arland Hutchinson, CPhT Specialty Pharmacy Patient [facility] Phone: [phone] Fax:  [phone]

## 2023-02-10 ENCOUNTER — Other Ambulatory Visit (HOSPITAL_COMMUNITY): Payer: Self-pay

## 2023-02-10 DIAGNOSIS — T8454XD Infection and inflammatory reaction due to internal left knee prosthesis, subsequent encounter: Secondary | ICD-10-CM | POA: Diagnosis not present

## 2023-02-10 DIAGNOSIS — B182 Chronic viral hepatitis C: Secondary | ICD-10-CM | POA: Diagnosis not present

## 2023-02-10 DIAGNOSIS — B20 Human immunodeficiency virus [HIV] disease: Secondary | ICD-10-CM | POA: Diagnosis not present
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
## 2023-02-11 ENCOUNTER — Ambulatory Visit: Payer: Medicare Other | Admitting: Gastroenterology

## 2023-02-11 ENCOUNTER — Encounter: Payer: Self-pay | Admitting: Gastroenterology

## 2023-02-11 VITALS — BP 152/85 | HR 85 | Temp 98.2°F | Ht 68.0 in | Wt 187.0 lb

## 2023-02-11 DIAGNOSIS — K7460 Unspecified cirrhosis of liver: Secondary | ICD-10-CM | POA: Diagnosis not present

## 2023-02-11 DIAGNOSIS — B182 Chronic viral hepatitis C: Secondary | ICD-10-CM

## 2023-02-11 DIAGNOSIS — I8500 Esophageal varices without bleeding: Secondary | ICD-10-CM

## 2023-02-11 DIAGNOSIS — K2091 Esophagitis, unspecified with bleeding: Secondary | ICD-10-CM

## 2023-02-11 DIAGNOSIS — M1732 Unilateral post-traumatic osteoarthritis, left knee: Secondary | ICD-10-CM | POA: Diagnosis not present

## 2023-02-11 DIAGNOSIS — K209 Esophagitis, unspecified without bleeding: Secondary | ICD-10-CM

## 2023-02-11 DIAGNOSIS — F1011 Alcohol abuse, in remission: Secondary | ICD-10-CM | POA: Diagnosis not present

## 2023-02-11 LAB — CBC WITH DIFFERENTIAL/PLATELET
Absolute Lymphocytes: 664 {cells}/uL — ABNORMAL LOW (ref 850–3900)
Absolute Monocytes: 950 {cells}/uL (ref 200–950)
Basophils Absolute: 9 {cells}/uL (ref 0–200)
Basophils Relative: 0.2 %
Eosinophils Absolute: 163 {cells}/uL (ref 15–500)
Eosinophils Relative: 3.7 %
HCT: 33.2 % — ABNORMAL LOW (ref 38.5–50.0)
Hemoglobin: 11.3 g/dL — ABNORMAL LOW (ref 13.2–17.1)
MCH: 30.7 pg (ref 27.0–33.0)
MCHC: 34 g/dL (ref 32.0–36.0)
MCV: 90.2 fL (ref 80.0–100.0)
MPV: 11.3 fL (ref 7.5–12.5)
Monocytes Relative: 21.6 %
Neutro Abs: 2614 {cells}/uL (ref 1500–7800)
Neutrophils Relative %: 59.4 %
Platelets: 86 10*3/uL — ABNORMAL LOW (ref 140–400)
RBC: 3.68 10*6/uL — ABNORMAL LOW (ref 4.20–5.80)
RDW: 14.8 % (ref 11.0–15.0)
Total Lymphocyte: 15.1 %
WBC: 4.4 10*3/uL (ref 3.8–10.8)

## 2023-02-11 LAB — COMPLETE METABOLIC PANEL WITH GFR
AG Ratio: 0.8 (calc) — ABNORMAL LOW (ref 1.0–2.5)
ALT: 63 U/L — ABNORMAL HIGH (ref 9–46)
AST: 64 U/L — ABNORMAL HIGH (ref 10–35)
Albumin: 3.4 g/dL — ABNORMAL LOW (ref 3.6–5.1)
Alkaline phosphatase (APISO): 164 U/L — ABNORMAL HIGH (ref 35–144)
BUN: 23 mg/dL (ref 7–25)
CO2: 23 mmol/L (ref 20–32)
Calcium: 9.1 mg/dL (ref 8.6–10.3)
Chloride: 103 mmol/L (ref 98–110)
Creat: 0.98 mg/dL (ref 0.70–1.28)
Globulin: 4.4 g/dL — ABNORMAL HIGH (ref 1.9–3.7)
Glucose, Bld: 112 mg/dL — ABNORMAL HIGH (ref 65–99)
Potassium: 4.3 mmol/L (ref 3.5–5.3)
Sodium: 135 mmol/L (ref 135–146)
Total Bilirubin: 0.7 mg/dL (ref 0.2–1.2)
Total Protein: 7.8 g/dL (ref 6.1–8.1)
eGFR: 82 mL/min/{1.73_m2} (ref >=60)

## 2023-02-11 LAB — T-HELPER CELLS (CD4) COUNT (NOT AT ARMC)
Absolute CD4: 140 {cells}/uL — ABNORMAL LOW (ref 490–1740)
CD4 T Helper %: 18 % — ABNORMAL LOW (ref 30–61)
Total lymphocyte count: 763 {cells}/uL — ABNORMAL LOW (ref 850–3900)

## 2023-02-11 LAB — SEDIMENTATION RATE: Sed Rate: 48 mm/h — ABNORMAL HIGH (ref 0–20)

## 2023-02-11 LAB — C-REACTIVE PROTEIN: CRP: 11.3 mg/L — ABNORMAL HIGH (ref <8.0)

## 2023-02-11 LAB — HIV-1 RNA QUANT-NO REFLEX-BLD
HIV 1 RNA Quant: NOT DETECTED {copies}/mL
HIV-1 RNA Quant, Log: NOT DETECTED {Log_copies}/mL

## 2023-02-11 MED ORDER — CARVEDILOL 6.25 MG PO TABS
6.2500 mg | ORAL_TABLET | Freq: Two times a day (BID) | ORAL | 3 refills | Status: AC
Start: 2023-02-11 — End: ?

## 2023-02-11 MED ORDER — PANTOPRAZOLE SODIUM 40 MG PO TBEC: 40.0000 mg | DELAYED_RELEASE_TABLET | Freq: Every day | ORAL | 3 refills | Status: DC

## 2023-02-11 NOTE — Patient Instructions (Addendum)
 We will plan on repeating your ultrasound of your liver as well as your liver labs in March.  I am increasing her carvedilol  to 6.25 mg twice daily.  Goal heart rate is 55 to 60 bpm.  Please notify the office if systolic blood pressure is <100 and diastolic blood pressure < 60.  Cirrhosis Lifestyle Recommendations:  High-protein diet from a primarily plant-based diet. Avoid red meat.  No raw or undercooked meat, seafood, or shellfish. Low-fat/cholesterol/carbohydrate diet. Limit sodium to no more than 2000 mg/day including everything that you eat and drink. Recommend at least 30 minutes of aerobic and resistance exercise 3 days/week. Limit Tylenol  to 2000 mg daily.   Continue to avoid any NSAIDs and alcohol use.  Continue iron daily and low-sodium diet.  Follow-up in 6 months.  It was a pleasure to see you today. I want to create trusting relationships with patients. If you receive a survey regarding your visit,  I greatly appreciate you taking time to fill this out on paper or through your MyChart. I value your feedback.  Charmaine Melia, MSN, FNP-BC, AGACNP-BC [facility]

## 2023-02-12 DIAGNOSIS — I10 Essential (primary) hypertension: Secondary | ICD-10-CM | POA: Diagnosis not present

## 2023-02-13 DIAGNOSIS — K7469 Other cirrhosis of liver: Secondary | ICD-10-CM | POA: Diagnosis not present

## 2023-02-13 DIAGNOSIS — B182 Chronic viral hepatitis C: Secondary | ICD-10-CM | POA: Diagnosis not present

## 2023-02-13 DIAGNOSIS — K209 Esophagitis, unspecified without bleeding: Secondary | ICD-10-CM | POA: Diagnosis not present

## 2023-02-13 DIAGNOSIS — M6281 Muscle weakness (generalized): Secondary | ICD-10-CM | POA: Diagnosis not present

## 2023-02-13 DIAGNOSIS — R5381 Other malaise: Secondary | ICD-10-CM | POA: Diagnosis not present

## 2023-02-16 NOTE — Telephone Encounter (Signed)
 It should be tedizolid 200 mg (1 tablet) once daily. Dr. Feliz Beam note states to treat for 3 months.

## 2023-02-16 NOTE — Telephone Encounter (Signed)
 Victorino Dike, RN at [facility] called asking about PA for [employer]. RN is needing directions and dosage of medication so they can order it for patient. Please advise.    Pamela Maddy Lesli Albee, CMA

## 2023-02-16 NOTE — Telephone Encounter (Addendum)
 Called Jennifer,RN - left voicemail asking her to return my call.  Call back [phone]  Marcell Anger, CMA

## 2023-02-17 NOTE — Telephone Encounter (Signed)
 Victorino Dike, RN with [facility] called back, relayed dosing instructions for tedizolid per note by Margarite Gouge, [facility].   It should be tedizolid 200 mg (1 tablet) once daily. Dr. Feliz Beam note states to treat for 3 months.       Sandie Ano, RN

## 2023-03-11 ENCOUNTER — Ambulatory Visit (INDEPENDENT_AMBULATORY_CARE_PROVIDER_SITE_OTHER): Payer: Medicare Other | Admitting: Internal Medicine

## 2023-03-11 ENCOUNTER — Other Ambulatory Visit: Payer: Self-pay

## 2023-03-11 ENCOUNTER — Encounter: Payer: Self-pay | Admitting: Internal Medicine

## 2023-03-11 ENCOUNTER — Telehealth: Payer: Self-pay

## 2023-03-11 VITALS — BP 138/84 | HR 77 | Resp 16 | Ht 68.0 in

## 2023-03-11 DIAGNOSIS — B20 Human immunodeficiency virus [HIV] disease: Secondary | ICD-10-CM | POA: Diagnosis not present

## 2023-03-11 DIAGNOSIS — T8454XA Infection and inflammatory reaction due to internal left knee prosthesis, initial encounter: Secondary | ICD-10-CM

## 2023-03-11 DIAGNOSIS — Z792 Long term (current) use of antibiotics: Secondary | ICD-10-CM

## 2023-03-11 DIAGNOSIS — T8450XD Infection and inflammatory reaction due to unspecified internal joint prosthesis, subsequent encounter: Secondary | ICD-10-CM

## 2023-03-11 NOTE — Progress Notes (Signed)
 Follow up for left knee pji Patient ID: Logan Levine, male   DOB: 08-22-1950, 73 y.o.   MRN: [id number]  HPI Logan Levine is a80yo M with left knee pji and HIV disease. He is currently on tedizolid through end of [facility] April 2-25 for his  eft knee pji for corynebacterium from 9/24. In terms of his HIV disease. He reports his left knee is healed. Doing well. Still having right knee pain, using brace to help. No fever, chills, mild swelling to left knee.  Hiv cd 4 count of 140/vl .undetectable in jan 2025.. On biktarvy  and bactrim   Outpatient Encounter Medications as of 03/11/2023  Medication Sig   acetaminophen  (TYLENOL ) 325 MG tablet Take 325 mg by mouth every 4 (four) hours as needed for mild pain, fever or headache.   albuterol  (PROVENTIL  HFA;VENTOLIN  HFA) 108 (90 Base) MCG/ACT inhaler Inhale 2 puffs into the lungs every 6 (six) hours as needed for wheezing or shortness of breath.   ascorbic acid (VITAMIN C) 500 MG tablet Take 500 mg by mouth daily.   aspirin  EC 81 MG tablet Take 81 mg by mouth daily.   bictegravir-emtricitabine -tenofovir  AF (BIKTARVY ) 50-200-25 MG TABS tablet Take 1 tablet by mouth daily.   carvedilol  (COREG ) 6.25 MG tablet Take 1 tablet (6.25 mg total) by mouth 2 (two) times daily with a meal.   ferrous sulfate  325 (65 FE) MG EC tablet Take 325 mg by mouth in the morning and at bedtime.   Fluticasone  Propionate, Inhal, (FLUTICASONE  PROPIONATE DISKUS) 100 MCG/ACT AEPB Inhale 1 puff into the lungs in the morning and at bedtime.   Menthol , Topical Analgesic, (BIOFREEZE COOL THE PAIN) 4 % GEL Apply topically.   methocarbamol  (ROBAXIN ) 500 MG tablet Take 1 tablet (500 mg total) by mouth every 6 (six) hours as needed for muscle spasms.   ondansetron  (ZOFRAN ) 4 MG tablet Take 1 tablet (4 mg total) by mouth every 6 (six) hours as needed for nausea.   oxyCODONE  (OXY IR/ROXICODONE ) 5 MG immediate release tablet Take 1-2 tablets (5-10 mg total) by mouth every 6 (six) hours as  needed for severe pain.   pantoprazole  (PROTONIX ) 40 MG tablet Take 1 tablet (40 mg total) by mouth daily before breakfast.   polyethylene glycol powder (GLYCOLAX /MIRALAX ) 17 GM/SCOOP powder Measure 17 g of powder, mix in 4 oz of water  or juice & drink by mouth daily as needed for mild constipation.   sulfamethoxazole -trimethoprim  (BACTRIM ) 400-80 MG tablet Take 1 tablet by mouth daily.   tamsulosin  (FLOMAX ) 0.4 MG CAPS capsule Take 0.8 mg by mouth daily.   traMADol  (ULTRAM ) 50 MG tablet Take 1-2 tablets (50-100 mg total) by mouth every 6 (six) hours as needed for moderate pain.   valACYclovir  (VALTREX ) 500 MG tablet Take 500 mg by mouth daily.   No facility-administered encounter medications on file as of 03/11/2023.     Patient Active Problem List   Diagnosis Date Noted   Upper GI bleed 10/17/2022   Gastrointestinal hemorrhage with hematemesis 10/16/2022   Dementia without behavioral disturbance (HCC) 10/16/2022    Class: Chronic   Small bowel obstruction (HCC) 10/16/2022    Class: Acute   Acute esophagitis 10/16/2022   Secondary esophageal varices without bleeding (HCC) 10/16/2022   Infection of prosthetic joint (HCC) 10/08/2022   Traumatic hematoma of right knee 10/19/2017   Septic arthritis of knee, left (HCC) 05/04/2017   Hepatic cirrhosis due to chronic hepatitis C infection (HCC) 10/29/2016   Constipation 10/29/2016   History of  revision of total knee arthroplasty 11/12/2009   KNEE PAIN, ACUTE 11/01/2008   PYELONEPHRITIS, ACUTE 05/28/2007   HERPES, GENITAL NEC 11/19/2006   GOUT NOS 11/19/2006   SMOKER 11/19/2006   HYPERTENSION, BENIGN ESSENTIAL 11/19/2006   BRONCHITIS, CHRONIC NEC 11/19/2006   Umbilical hernia 11/19/2006   HIV DISEASE 07/03/2006   HEPATITIS C 07/03/2006   SYPHILIS, EARLY, SYMPTOMATIC, PRIMARY NEC 07/03/2006   Osteoarthritis 07/03/2006     Health Maintenance Due  Topic Date Due   COVID-19 Vaccine (1) Never done   Zoster Vaccines- Shingrix (1 of 2)  Never done   Colonoscopy  Never done   Pneumonia Vaccine 79+ Years old (2 of 2 - PCV) 12/16/2006   DTaP/Tdap/Td (2 - Tdap) 11/18/2016   Medicare Annual Wellness (AWV)  03/14/2023     Review of Systems 12 point ros is negative Physical Exam   Resp 16   Ht 5' 8 (1.727 m)   BMI 28.43 kg/m   Physical Exam  Constitutional: He is oriented to person, place, and time. He appears well-developed and well-nourished. No distress.  HENT:  Mouth/Throat: Oropharynx is clear and moist. No oropharyngeal exudate.  Cardiovascular: Normal rate, regular rhythm and normal heart sounds. Exam reveals no gallop and no friction rub.  No murmur heard.  Pulmonary/Chest: Effort normal and breath sounds normal. No respiratory distress. He has no wheezes.  Ext: knees wrapped Lymphadenopathy:  He has no cervical adenopathy.  Neurological: He is alert and oriented to person, place, and time.  Skin: Skin is warm and dry. No rash noted. No erythema.  Psychiatric: He has a normal mood and affect. His behavior is normal.   Lab Results  Component Value Date   CD4TCELL 18 (L) 02/09/2023   Lab Results  Component Value Date   CD4TABS 125 (L) 11/13/2022   CD4TABS 140 (L) 05/07/2017   CD4TABS 470 10/29/2009   Lab Results  Component Value Date   HIV1RNAQUANT Not Detected 02/09/2023   Lab Results  Component Value Date   HEPBSAB No 03/30/2006   Lab Results  Component Value Date   LABRPR NON REAC 03/29/2009    CBC Lab Results  Component Value Date   WBC 4.4 02/09/2023   RBC 3.68 (L) 02/09/2023   HGB 11.3 (L) 02/09/2023   HCT 33.2 (L) 02/09/2023   PLT 86 (L) 02/09/2023   MCV 90.2 02/09/2023   MCH 30.7 02/09/2023   MCHC 34.0 02/09/2023   RDW 14.8 02/09/2023   LYMPHSABS 796 (L) 11/13/2022   MONOABS 1.0 10/09/2017   EOSABS 163 02/09/2023    BMET Lab Results  Component Value Date   NA 135 02/09/2023   K 4.3 02/09/2023   CL 103 02/09/2023   CO2 23 02/09/2023   GLUCOSE 112 (H) 02/09/2023    BUN 23 02/09/2023   CREATININE 0.98 02/09/2023   CALCIUM 9.1 02/09/2023   GFRNONAA >60 10/21/2022   GFRAA 48 (L) 10/20/2017      Assessment and Plan   Pji - continue on chronic suppression however -- need to change to alternative abtx due to myelosuppression SE of teidzolid. Need to exnted to early April 2025. Need to change to Hiv disease= continue on bactrim  and biktarvy    Long term medicaiton management = will check cr  Will check cbc sed rate and crp - will stop tedizolid since myelosuppresion/thrombocytopenia-- for pji Staying at [facility] rehab. To find alternative treatment for pji

## 2023-03-11 NOTE — Telephone Encounter (Signed)
Went over changes and gave order to facility Aid who was waiting in waiting room. Also tried to call nursing staff but could not get a nurse on the line.

## 2023-03-12 LAB — CBC WITH DIFFERENTIAL/PLATELET
Absolute Lymphocytes: 659 {cells}/uL — ABNORMAL LOW (ref 850–3900)
Absolute Monocytes: 691 {cells}/uL (ref 200–950)
Basophils Absolute: 22 {cells}/uL (ref 0–200)
Basophils Relative: 0.6 %
Eosinophils Absolute: 284 {cells}/uL (ref 15–500)
Eosinophils Relative: 7.9 %
HCT: 38 % — ABNORMAL LOW (ref 38.5–50.0)
Hemoglobin: 12.6 g/dL — ABNORMAL LOW (ref 13.2–17.1)
MCH: 30.3 pg (ref 27.0–33.0)
MCHC: 33.2 g/dL (ref 32.0–36.0)
MCV: 91.3 fL (ref 80.0–100.0)
MPV: 11.5 fL (ref 7.5–12.5)
Monocytes Relative: 19.2 %
Neutro Abs: 1944 {cells}/uL (ref 1500–7800)
Neutrophils Relative %: 54 %
Platelets: 101 10*3/uL — ABNORMAL LOW (ref 140–400)
RBC: 4.16 10*6/uL — ABNORMAL LOW (ref 4.20–5.80)
RDW: 14.6 % (ref 11.0–15.0)
Total Lymphocyte: 18.3 %
WBC: 3.6 10*3/uL — ABNORMAL LOW (ref 3.8–10.8)

## 2023-03-12 LAB — SEDIMENTATION RATE: Sed Rate: 29 mm/h — ABNORMAL HIGH (ref 0–20)

## 2023-03-12 LAB — C-REACTIVE PROTEIN: CRP: <3 mg/L (ref <8.0)

## 2023-03-13 DIAGNOSIS — R5381 Other malaise: Secondary | ICD-10-CM | POA: Diagnosis not present

## 2023-03-13 DIAGNOSIS — T8454XD Infection and inflammatory reaction due to internal left knee prosthesis, subsequent encounter: Secondary | ICD-10-CM | POA: Diagnosis not present

## 2023-03-13 DIAGNOSIS — Z96652 Presence of left artificial knee joint: Secondary | ICD-10-CM | POA: Diagnosis not present

## 2023-03-13 DIAGNOSIS — Z21 Asymptomatic human immunodeficiency virus [HIV] infection status: Secondary | ICD-10-CM | POA: Diagnosis not present

## 2023-03-13 DIAGNOSIS — M009 Pyogenic arthritis, unspecified: Secondary | ICD-10-CM | POA: Diagnosis not present

## 2023-03-13 DIAGNOSIS — Z471 Aftercare following joint replacement surgery: Secondary | ICD-10-CM | POA: Diagnosis not present

## 2023-03-13 DIAGNOSIS — M1732 Unilateral post-traumatic osteoarthritis, left knee: Secondary | ICD-10-CM | POA: Diagnosis not present

## 2023-03-13 DIAGNOSIS — M6281 Muscle weakness (generalized): Secondary | ICD-10-CM | POA: Diagnosis not present

## 2023-03-16 DIAGNOSIS — R1011 Right upper quadrant pain: Secondary | ICD-10-CM | POA: Diagnosis not present

## 2023-03-18 DIAGNOSIS — K5641 Fecal impaction: Secondary | ICD-10-CM | POA: Diagnosis not present

## 2023-03-18 DIAGNOSIS — M6281 Muscle weakness (generalized): Secondary | ICD-10-CM | POA: Diagnosis not present

## 2023-03-18 DIAGNOSIS — R5381 Other malaise: Secondary | ICD-10-CM | POA: Diagnosis not present

## 2023-03-18 DIAGNOSIS — R1011 Right upper quadrant pain: Secondary | ICD-10-CM | POA: Diagnosis not present

## 2023-03-18 DIAGNOSIS — M545 Low back pain, unspecified: Secondary | ICD-10-CM | POA: Diagnosis not present

## 2023-03-18 DIAGNOSIS — R140 Abdominal distension (gaseous): Secondary | ICD-10-CM | POA: Diagnosis not present

## 2023-03-19 DIAGNOSIS — N390 Urinary tract infection, site not specified: Secondary | ICD-10-CM | POA: Diagnosis not present

## 2023-03-24 DIAGNOSIS — B999 Unspecified infectious disease: Secondary | ICD-10-CM | POA: Diagnosis not present

## 2023-04-08 ENCOUNTER — Ambulatory Visit: Payer: Medicare Other | Admitting: Internal Medicine

## 2023-04-08 DIAGNOSIS — F5101 Primary insomnia: Secondary | ICD-10-CM | POA: Diagnosis not present

## 2023-04-08 DIAGNOSIS — F0390 Unspecified dementia without behavioral disturbance: Secondary | ICD-10-CM | POA: Diagnosis not present

## 2023-04-13 DIAGNOSIS — J4489 Other specified chronic obstructive pulmonary disease: Secondary | ICD-10-CM | POA: Diagnosis not present

## 2023-04-13 DIAGNOSIS — R1011 Right upper quadrant pain: Secondary | ICD-10-CM | POA: Diagnosis not present

## 2023-04-13 DIAGNOSIS — K219 Gastro-esophageal reflux disease without esophagitis: Secondary | ICD-10-CM | POA: Diagnosis not present

## 2023-04-15 DIAGNOSIS — F0390 Unspecified dementia without behavioral disturbance: Secondary | ICD-10-CM | POA: Diagnosis not present

## 2023-04-15 DIAGNOSIS — F5101 Primary insomnia: Secondary | ICD-10-CM | POA: Diagnosis not present

## 2023-04-24 ENCOUNTER — Ambulatory Visit (INDEPENDENT_AMBULATORY_CARE_PROVIDER_SITE_OTHER): Admitting: Internal Medicine

## 2023-04-24 ENCOUNTER — Other Ambulatory Visit: Payer: Self-pay

## 2023-04-24 ENCOUNTER — Encounter: Payer: Self-pay | Admitting: Internal Medicine

## 2023-04-24 VITALS — BP 146/81 | HR 60 | Temp 97.3°F

## 2023-04-24 DIAGNOSIS — Z23 Encounter for immunization: Secondary | ICD-10-CM

## 2023-04-24 DIAGNOSIS — Z792 Long term (current) use of antibiotics: Secondary | ICD-10-CM

## 2023-04-24 DIAGNOSIS — B20 Human immunodeficiency virus [HIV] disease: Secondary | ICD-10-CM | POA: Diagnosis not present

## 2023-04-24 DIAGNOSIS — T8450XD Infection and inflammatory reaction due to unspecified internal joint prosthesis, subsequent encounter: Secondary | ICD-10-CM

## 2023-04-24 NOTE — Progress Notes (Signed)
 Patient ID: Logan Levine, male   DOB: 03-01-1950, 73 y.o.   MRN: [id number]  HPI Cd 4 count of 140/VL<20, on biktarvy and bactrim  Noticing pain to his left knee when walking with walker. Try to do it daily at the rehab but also has some right knee instability. Has hard brace in right knee, soft brace on left knee/pji.  Dr Lequita Halt planning to have revision to left knee on 5/5  Outpatient Encounter Medications as of 04/24/2023  Medication Sig   acetaminophen (TYLENOL) 325 MG tablet Take 325 mg by mouth every 4 (four) hours as needed for mild pain, fever or headache.   albuterol (PROVENTIL HFA;VENTOLIN HFA) 108 (90 Base) MCG/ACT inhaler Inhale 2 puffs into the lungs every 6 (six) hours as needed for wheezing or shortness of breath.   ascorbic acid (VITAMIN C) 500 MG tablet Take 500 mg by mouth daily.   aspirin EC 81 MG tablet Take 81 mg by mouth daily.   bictegravir-emtricitabine-tenofovir AF (BIKTARVY) 50-200-25 MG TABS tablet Take 1 tablet by mouth daily.   carvedilol (COREG) 6.25 MG tablet Take 1 tablet (6.25 mg total) by mouth 2 (two) times daily with a meal.   dextromethorphan-guaiFENesin (GERI-TUSSIN DM) 10-100 MG/5ML liquid Take by mouth every 4 (four) hours as needed for cough.   ferrous sulfate 325 (65 FE) MG EC tablet Take 325 mg by mouth in the morning and at bedtime.   Fluticasone Propionate, Inhal, (FLUTICASONE PROPIONATE DISKUS) 100 MCG/ACT AEPB Inhale 1 puff into the lungs in the morning and at bedtime.   Menthol, Topical Analgesic, (BIOFREEZE COOL THE PAIN) 4 % GEL Apply topically.   methocarbamol (ROBAXIN) 500 MG tablet Take 1 tablet (500 mg total) by mouth every 6 (six) hours as needed for muscle spasms.   ondansetron (ZOFRAN) 4 MG tablet Take 1 tablet (4 mg total) by mouth every 6 (six) hours as needed for nausea.   oxyCODONE (OXY IR/ROXICODONE) 5 MG immediate release tablet Take 1-2 tablets (5-10 mg total) by mouth every 6 (six) hours as needed for severe pain.    pantoprazole (PROTONIX) 40 MG tablet Take 1 tablet (40 mg total) by mouth daily before breakfast.   polyethylene glycol powder (GLYCOLAX/MIRALAX) 17 GM/SCOOP powder Measure 17 g of powder, mix in 4 oz of water or juice & drink by mouth daily as needed for mild constipation.   sulfamethoxazole-trimethoprim (BACTRIM) 400-80 MG tablet Take 1 tablet by mouth daily.   tamsulosin (FLOMAX) 0.4 MG CAPS capsule Take 0.8 mg by mouth daily.   traZODone (DESYREL) 100 MG tablet Take 100 mg by mouth at bedtime.   valACYclovir (VALTREX) 500 MG tablet Take 500 mg by mouth daily.   Zinc 50 MG TABS Take by mouth.   traMADol (ULTRAM) 50 MG tablet Take 1-2 tablets (50-100 mg total) by mouth every 6 (six) hours as needed for moderate pain. (Patient not taking: Reported on 04/24/2023)   No facility-administered encounter medications on file as of 04/24/2023.     Patient Active Problem List   Diagnosis Date Noted   Upper GI bleed 10/17/2022   Gastrointestinal hemorrhage with hematemesis 10/16/2022   Dementia without behavioral disturbance (HCC) 10/16/2022    Class: Chronic   Small bowel obstruction (HCC) 10/16/2022    Class: Acute   Acute esophagitis 10/16/2022   Secondary esophageal varices without bleeding (HCC) 10/16/2022   Infection of prosthetic joint (HCC) 10/08/2022   Traumatic hematoma of right knee 10/19/2017   Septic arthritis of knee, left (  HCC) 05/04/2017   Hepatic cirrhosis due to chronic hepatitis C infection (HCC) 10/29/2016   Constipation 10/29/2016   History of revision of total knee arthroplasty 11/12/2009   KNEE PAIN, ACUTE 11/01/2008   PYELONEPHRITIS, ACUTE 05/28/2007   HERPES, GENITAL NEC 11/19/2006   GOUT NOS 11/19/2006   SMOKER 11/19/2006   HYPERTENSION, BENIGN ESSENTIAL 11/19/2006   BRONCHITIS, CHRONIC NEC 11/19/2006   Umbilical hernia 11/19/2006   HIV DISEASE 07/03/2006   HEPATITIS C 07/03/2006   SYPHILIS, EARLY, SYMPTOMATIC, PRIMARY NEC 07/03/2006   Osteoarthritis 07/03/2006      Health Maintenance Due  Topic Date Due   COVID-19 Vaccine (1) Never done   Zoster Vaccines- Shingrix (1 of 2) Never done   Colonoscopy  Never done   Pneumonia Vaccine 82+ Years old (2 of 2 - PCV) 12/16/2006   DTaP/Tdap/Td (2 - Tdap) 11/18/2016   Medicare Annual Wellness (AWV)  03/14/2023     Review of Systems +bilateral knee pain. 12 point ros is otherwise negative Physical Exam   BP (!) 146/81   Pulse 60   Temp (!) 97.3 F (36.3 C) (Oral)   SpO2 97%    Physical Exam  Constitutional: He is oriented to person, place, and time. He appears well-developed and well-nourished. No distress.  HENT:  Mouth/Throat: Oropharynx is clear and moist. No oropharyngeal exudate.  Cardiovascular: Normal rate, regular rhythm and normal heart sounds. Exam reveals no gallop and no friction rub.  No murmur heard.  Pulmonary/Chest: Effort normal and breath sounds normal. No respiratory distress. He has no wheezes.  Ext: right knee in hard immobilizer, left knee (pji) in soft immobilizer Neurological: He is alert and oriented to person, place, and time.  Skin: Skin is warm and dry. No rash noted. No erythema.  Psychiatric: He has a normal mood and affect. His behavior is normal.    Lab Results  Component Value Date   CD4TABS 125 (L) 11/13/2022   CD4TABS 140 (L) 05/07/2017   CD4TABS 470 10/29/2009   Lab Results  Component Value Date   HIV1RNAQUANT Not Detected 02/09/2023   Lab Results  Component Value Date   HEPBSAB No 03/30/2006   Lab Results  Component Value Date   LABRPR NON REAC 03/29/2009    CBC Lab Results  Component Value Date   WBC 3.6 (L) 03/11/2023   RBC 4.16 (L) 03/11/2023   HGB 12.6 (L) 03/11/2023   HCT 38.0 (L) 03/11/2023   PLT 101 (L) 03/11/2023   MCV 91.3 03/11/2023   MCH 30.3 03/11/2023   MCHC 33.2 03/11/2023   RDW 14.6 03/11/2023   LYMPHSABS 796 (L) 11/13/2022   MONOABS 1.0 10/09/2017   EOSABS 284 03/11/2023    BMET Lab Results  Component Value  Date   NA 135 02/09/2023   K 4.3 02/09/2023   CL 103 02/09/2023   CO2 23 02/09/2023   GLUCOSE 112 (H) 02/09/2023   BUN 23 02/09/2023   CREATININE 0.98 02/09/2023   CALCIUM 9.1 02/09/2023   GFRNONAA >60 10/21/2022   GFRAA 48 (L) 10/20/2017      Assessment and Plan  Hiv disease = will check labs  Long term medication management = will check cr  Hx of left knee pji, corynbacterium = will check sed rate and crp; upcoming revision on 5/5  Health maintenance  = will give prevnar 20

## 2023-04-27 LAB — COMPLETE METABOLIC PANEL WITH GFR
AG Ratio: 0.7 (calc) — ABNORMAL LOW (ref 1.0–2.5)
ALT: 133 U/L — ABNORMAL HIGH (ref 9–46)
AST: 175 U/L — ABNORMAL HIGH (ref 10–35)
Albumin: 3 g/dL — ABNORMAL LOW (ref 3.6–5.1)
Alkaline phosphatase (APISO): 150 U/L — ABNORMAL HIGH (ref 35–144)
BUN: 21 mg/dL (ref 7–25)
CO2: 23 mmol/L (ref 20–32)
Calcium: 9 mg/dL (ref 8.6–10.3)
Chloride: 105 mmol/L (ref 98–110)
Creat: 1.04 mg/dL (ref 0.70–1.28)
Globulin: 4.6 g/dL — ABNORMAL HIGH (ref 1.9–3.7)
Glucose, Bld: 102 mg/dL — ABNORMAL HIGH (ref 65–99)
Potassium: 4.5 mmol/L (ref 3.5–5.3)
Sodium: 135 mmol/L (ref 135–146)
Total Bilirubin: 0.6 mg/dL (ref 0.2–1.2)
Total Protein: 7.6 g/dL (ref 6.1–8.1)

## 2023-04-27 LAB — CBC WITH DIFFERENTIAL/PLATELET
Absolute Lymphocytes: 568 {cells}/uL — ABNORMAL LOW (ref 850–3900)
Absolute Monocytes: 560 {cells}/uL (ref 200–950)
Basophils Absolute: 20 {cells}/uL (ref 0–200)
Basophils Relative: 0.7 %
Eosinophils Absolute: 162 {cells}/uL (ref 15–500)
Eosinophils Relative: 5.6 %
HCT: 40.5 % (ref 38.5–50.0)
Hemoglobin: 13.4 g/dL (ref 13.2–17.1)
MCH: 30.6 pg (ref 27.0–33.0)
MCHC: 33.1 g/dL (ref 32.0–36.0)
MCV: 92.5 fL (ref 80.0–100.0)
MPV: 12.4 fL (ref 7.5–12.5)
Monocytes Relative: 19.3 %
Neutro Abs: 1589 {cells}/uL (ref 1500–7800)
Neutrophils Relative %: 54.8 %
Platelets: 127 10*3/uL — ABNORMAL LOW (ref 140–400)
RBC: 4.38 10*6/uL (ref 4.20–5.80)
RDW: 14.9 % (ref 11.0–15.0)
Total Lymphocyte: 19.6 %
WBC: 2.9 10*3/uL — ABNORMAL LOW (ref 3.8–10.8)

## 2023-04-27 LAB — HIV-1 RNA QUANT-NO REFLEX-BLD
HIV 1 RNA Quant: <20 {copies}/mL — AB
HIV-1 RNA Quant, Log: <1.3 {Log_copies}/mL — AB

## 2023-04-27 LAB — T-HELPER CELLS (CD4) COUNT (NOT AT ARMC)
Absolute CD4: 87 {cells}/uL — ABNORMAL LOW (ref 490–1740)
CD4 T Helper %: 14 % — ABNORMAL LOW (ref 30–61)
Total lymphocyte count: 620 {cells}/uL — ABNORMAL LOW (ref 850–3900)

## 2023-04-27 LAB — RPR: RPR Ser Ql: NONREACTIVE

## 2023-04-27 LAB — SEDIMENTATION RATE: Sed Rate: 31 mm/h — ABNORMAL HIGH (ref 0–20)

## 2023-04-28 DIAGNOSIS — Z21 Asymptomatic human immunodeficiency virus [HIV] infection status: Secondary | ICD-10-CM | POA: Diagnosis not present

## 2023-04-28 DIAGNOSIS — T8454XD Infection and inflammatory reaction due to internal left knee prosthesis, subsequent encounter: Secondary | ICD-10-CM | POA: Diagnosis not present

## 2023-04-28 DIAGNOSIS — B182 Chronic viral hepatitis C: Secondary | ICD-10-CM | POA: Diagnosis not present

## 2023-04-28 DIAGNOSIS — M009 Pyogenic arthritis, unspecified: Secondary | ICD-10-CM | POA: Diagnosis not present

## 2023-05-06 DIAGNOSIS — F0390 Unspecified dementia without behavioral disturbance: Secondary | ICD-10-CM | POA: Diagnosis not present

## 2023-05-06 DIAGNOSIS — F5101 Primary insomnia: Secondary | ICD-10-CM | POA: Diagnosis not present

## 2023-05-20 DIAGNOSIS — F0390 Unspecified dementia without behavioral disturbance: Secondary | ICD-10-CM | POA: Diagnosis not present

## 2023-05-20 DIAGNOSIS — F5101 Primary insomnia: Secondary | ICD-10-CM | POA: Diagnosis not present

## 2023-05-23 DIAGNOSIS — I10 Essential (primary) hypertension: Secondary | ICD-10-CM | POA: Diagnosis not present

## 2023-06-02 DIAGNOSIS — T8454XD Infection and inflammatory reaction due to internal left knee prosthesis, subsequent encounter: Secondary | ICD-10-CM | POA: Diagnosis not present

## 2023-06-03 DIAGNOSIS — M6281 Muscle weakness (generalized): Secondary | ICD-10-CM | POA: Diagnosis not present

## 2023-06-03 DIAGNOSIS — R5381 Other malaise: Secondary | ICD-10-CM | POA: Diagnosis not present

## 2023-06-03 DIAGNOSIS — M1732 Unilateral post-traumatic osteoarthritis, left knee: Secondary | ICD-10-CM | POA: Diagnosis not present

## 2023-06-03 DIAGNOSIS — Z21 Asymptomatic human immunodeficiency virus [HIV] infection status: Secondary | ICD-10-CM | POA: Diagnosis not present

## 2023-06-03 DIAGNOSIS — M009 Pyogenic arthritis, unspecified: Secondary | ICD-10-CM | POA: Diagnosis not present

## 2023-06-03 DIAGNOSIS — B182 Chronic viral hepatitis C: Secondary | ICD-10-CM | POA: Diagnosis not present

## 2023-06-03 NOTE — Progress Notes (Addendum)
 COVID Vaccine Completed: Yes  Date of COVID positive in last 90 days:  PCP - Darcel Early, MD at facility (note under media) Cardiologist - N/A  Clearance in Epic under media dated 06-03-23  Chest x-ray - 10-19-22 Epic EKG - 10-17-22 Epic Stress Test -  ECHO -  Cardiac Cath -  Pacemaker/ICD device last checked: Spinal Cord Stimulator:  Bowel Prep - N/A  Sleep Study -  CPAP -   Fasting Blood Sugar -  Checks Blood Sugar _____ times a day  Last dose of GLP1 agonist-  N/A GLP1 instructions:  Hold 7 days before surgery    Last dose of SGLT-2 inhibitors-  N/A SGLT-2 instructions:  Hold 3 days before surgery    Blood Thinner Instructions:  Last dose:   Time: Aspirin  Instructions:  ASA 81 mg  Last Dose:  06-02-23 per nursing home  Activity level:  Per nursing home uses a wheelchair   Anesthesia review:  COPD,  HTN, Hepatitis, cirrhosis.  ALT, AST, Alk pho elevated on recent labs (copy on chart)  Patient denies shortness of breath, fever, cough and chest pain at PAT appointment  Patient verbalized understanding of instructions that were given to them at the PAT appointment. Patient was also instructed that they will need to review over the PAT instructions again at home before surgery.
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
## 2023-06-04 ENCOUNTER — Encounter (HOSPITAL_COMMUNITY): Payer: Self-pay | Admitting: Orthopedic Surgery

## 2023-06-04 NOTE — Anesthesia Preprocedure Evaluation (Signed)
 Anesthesia Evaluation  Patient identified by MRN, date of birth, ID band Patient awake    Reviewed: Allergy & Precautions, NPO status , Patient's Chart, lab work & pertinent test results  History of Anesthesia Complications Negative for: history of anesthetic complications  Airway Mallampati: I  TM Distance: >3 FB Neck ROM: Full    Dental no notable dental hx. (+) Edentulous Upper, Edentulous Lower   Pulmonary COPD, former smoker   Pulmonary exam normal breath sounds clear to auscultation       Cardiovascular hypertension, (-) angina (-) Past MI Normal cardiovascular exam Rhythm:Regular Rate:Normal     Neuro/Psych  PSYCHIATRIC DISORDERS     Dementia    GI/Hepatic ,GERD  ,,(+) Cirrhosis   Esophageal Varices    , Hepatitis -, C  Endo/Other    Renal/GU Renal diseaseLab Results      Component                Value               Date                            K                        4.5                 04/24/2023                CO2                      23                  04/24/2023                BUN                      21                  04/24/2023                CREATININE               1.04                04/24/2023       GLUCOSE                  102 (H)             04/24/2023                Musculoskeletal  (+) Arthritis , Osteoarthritis,  gout   Abdominal   Peds  Hematology  (+) HIVLab Results      Component                Value               Date                      WBC                      2.9 (L)             04/24/2023                HGB  13.4                04/24/2023                HCT                      40.5                04/24/2023                MCV                      92.5                04/24/2023                PLT                      127 (L)             04/24/2023               Anesthesia Other Findings All: Morphine  NSAIDs  Reproductive/Obstetrics                              Anesthesia Physical Anesthesia Plan  ASA: 3  Anesthesia Plan: General and Regional   Post-op Pain Management: Regional block*   Induction: Intravenous  PONV Risk Score and Plan: 3 and Treatment may vary due to age or medical condition, Propofol  infusion and Ondansetron   Airway Management Planned: Oral ETT  Additional Equipment: None  Intra-op Plan:   Post-operative Plan: Extubation in OR  Informed Consent: I have reviewed the patients History and Physical, chart, labs and discussed the procedure including the risks, benefits and alternatives for the proposed anesthesia with the patient or authorized representative who has indicated his/her understanding and acceptance.     Dental advisory given  Plan Discussed with: CRNA and Surgeon  Anesthesia Plan Comments: (See PAT note from 5/1)        Anesthesia Quick Evaluation

## 2023-06-04 NOTE — Progress Notes (Signed)
 Preop instructions for:     Logan Levine Date of Birth:   01-25-2051                    Date of Procedure:   06-08-23 Procedure:    Left total knee revision Surgeon: Dr. France Ina Facility contact:  Claudis Cumber  Phone:  [phone]                          and Fax #: [phone]   Transportation contact phone#:  [facility] [phone]  Please send day of procedure:  Current med list  Medications taken the day of procedure (return attached form to hospital) confirm time of nothing by mouth status (return attached form to hospital) Patient Demographic info( to include DNR status, problem list, allergies) Bring Insurance card and picture ID    Time to arrive at [facility]:  10:45 AM   Report to: Admitting (On your left hand side)    Do not eat solid food past midnight the night before your procedure.(To include any tube feedings-must be discontinued)  May have the following until  10:30 AM day of procedure  CLEAR LIQUID DIET  Water  Black Coffee (sugar ok, NO MILK/CREAM OR CREAMERS)  Tea (sugar ok, NO MILK/CREAM OR CREAMERS) regular and decaf                             Plain Jell-O (NO RED)                                           Fruit ices (not with fruit pulp, NO RED)                                     Popsicles (NO RED)                                                                  Juice: apple, WHITE grape, WHITE cranberry Sports drinks like Gatorade (NO RED)   Take these morning medications only with sips of water .(or give through gastrostomy or feeding tube).  Biktarvy, Carvedilol , Pantoprazole , Tamsulosin .  If need Tylenol , Ondansetron , Oxycodone .  Okay to use inhalers  Hold Aspirin  5 days before surgery   Stop all vitamins and herbal supplements and NSAIDs 5 days before surgery.  Note: No Insulin or Diabetic meds should be given or taken the morning of the procedure!  Oral Hygiene is also important to reduce your risk of infection.                                     Remember - BRUSH YOUR TEETH THE MORNING OF SURGERY WITH YOUR REGULAR TOOTHPASTE   DENTURES WILL BE REMOVED PRIOR TO SURGERY PLEASE DO NOT APPLY "Poly grip" OR ADHESIVES!!!  Leave all jewelry and other valuables at place where living( no metal or rings to be worn) No contact lens Women-no  make-up, no lotions,perfumes,powders Men-no colognes,lotions   Any questions day of procedure,call  SHORT [phone]     Sent from :[facility]                   Phone:[phone]                   Fax:[phone]   Sent by :  Henretta Lodge RN
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
## 2023-06-04 NOTE — Progress Notes (Signed)
 Tangela phoned to confirm that instructions had been received.

## 2023-06-08 ENCOUNTER — Inpatient Hospital Stay (HOSPITAL_COMMUNITY)
Admission: RE | Admit: 2023-06-08 | Discharge: 2023-06-10 | DRG: 467 | Disposition: A | Discharge status: Skilled Nursing Facility | Payer: Medicare Other | Source: Skilled Nursing Facility | Attending: Orthopedic Surgery | Admitting: Orthopedic Surgery

## 2023-06-08 ENCOUNTER — Encounter (HOSPITAL_COMMUNITY): Payer: Self-pay | Admitting: Orthopedic Surgery

## 2023-06-08 ENCOUNTER — Inpatient Hospital Stay (HOSPITAL_COMMUNITY): Payer: Self-pay | Admitting: Medical

## 2023-06-08 ENCOUNTER — Other Ambulatory Visit: Payer: Self-pay

## 2023-06-08 ENCOUNTER — Encounter (HOSPITAL_COMMUNITY)
Admission: RE | Disposition: A | Discharge status: Skilled Nursing Facility | Payer: Self-pay | Source: Skilled Nursing Facility | Attending: Orthopedic Surgery

## 2023-06-08 DIAGNOSIS — Z21 Asymptomatic human immunodeficiency virus [HIV] infection status: Secondary | ICD-10-CM | POA: Diagnosis not present

## 2023-06-08 DIAGNOSIS — Z7982 Long term (current) use of aspirin: Secondary | ICD-10-CM | POA: Diagnosis not present

## 2023-06-08 DIAGNOSIS — M1712 Unilateral primary osteoarthritis, left knee: Secondary | ICD-10-CM | POA: Diagnosis present

## 2023-06-08 DIAGNOSIS — F03A0 Unspecified dementia, mild, without behavioral disturbance, psychotic disturbance, mood disturbance, and anxiety: Secondary | ICD-10-CM | POA: Diagnosis present

## 2023-06-08 DIAGNOSIS — I10 Essential (primary) hypertension: Secondary | ICD-10-CM | POA: Diagnosis not present

## 2023-06-08 DIAGNOSIS — T8454XA Infection and inflammatory reaction due to internal left knee prosthesis, initial encounter: Secondary | ICD-10-CM | POA: Diagnosis not present

## 2023-06-08 DIAGNOSIS — I129 Hypertensive chronic kidney disease with stage 1 through stage 4 chronic kidney disease, or unspecified chronic kidney disease: Secondary | ICD-10-CM | POA: Diagnosis present

## 2023-06-08 DIAGNOSIS — M109 Gout, unspecified: Secondary | ICD-10-CM | POA: Diagnosis present

## 2023-06-08 DIAGNOSIS — Z7901 Long term (current) use of anticoagulants: Secondary | ICD-10-CM | POA: Diagnosis not present

## 2023-06-08 DIAGNOSIS — Z96659 Presence of unspecified artificial knee joint: Secondary | ICD-10-CM | POA: Diagnosis present

## 2023-06-08 DIAGNOSIS — Z79899 Other long term (current) drug therapy: Secondary | ICD-10-CM

## 2023-06-08 DIAGNOSIS — E669 Obesity, unspecified: Secondary | ICD-10-CM | POA: Diagnosis present

## 2023-06-08 DIAGNOSIS — Z4789 Encounter for other orthopedic aftercare: Secondary | ICD-10-CM | POA: Diagnosis not present

## 2023-06-08 DIAGNOSIS — M6281 Muscle weakness (generalized): Secondary | ICD-10-CM | POA: Diagnosis not present

## 2023-06-08 DIAGNOSIS — A539 Syphilis, unspecified: Secondary | ICD-10-CM | POA: Diagnosis present

## 2023-06-08 DIAGNOSIS — K7460 Unspecified cirrhosis of liver: Secondary | ICD-10-CM | POA: Diagnosis present

## 2023-06-08 DIAGNOSIS — Z96652 Presence of left artificial knee joint: Secondary | ICD-10-CM | POA: Diagnosis not present

## 2023-06-08 DIAGNOSIS — I8510 Secondary esophageal varices without bleeding: Secondary | ICD-10-CM | POA: Diagnosis present

## 2023-06-08 DIAGNOSIS — Z01818 Encounter for other preprocedural examination: Principal | ICD-10-CM

## 2023-06-08 DIAGNOSIS — M009 Pyogenic arthritis, unspecified: Secondary | ICD-10-CM | POA: Diagnosis present

## 2023-06-08 DIAGNOSIS — N401 Enlarged prostate with lower urinary tract symptoms: Secondary | ICD-10-CM | POA: Diagnosis present

## 2023-06-08 DIAGNOSIS — Z8719 Personal history of other diseases of the digestive system: Secondary | ICD-10-CM

## 2023-06-08 DIAGNOSIS — M1990 Unspecified osteoarthritis, unspecified site: Secondary | ICD-10-CM | POA: Diagnosis present

## 2023-06-08 DIAGNOSIS — B20 Human immunodeficiency virus [HIV] disease: Secondary | ICD-10-CM | POA: Diagnosis present

## 2023-06-08 DIAGNOSIS — E441 Mild protein-calorie malnutrition: Secondary | ICD-10-CM | POA: Diagnosis present

## 2023-06-08 DIAGNOSIS — J4489 Other specified chronic obstructive pulmonary disease: Secondary | ICD-10-CM | POA: Diagnosis present

## 2023-06-08 DIAGNOSIS — I8500 Esophageal varices without bleeding: Secondary | ICD-10-CM | POA: Diagnosis present

## 2023-06-08 DIAGNOSIS — L7682 Other postprocedural complications of skin and subcutaneous tissue: Secondary | ICD-10-CM | POA: Diagnosis present

## 2023-06-08 DIAGNOSIS — D8481 Immunodeficiency due to conditions classified elsewhere: Secondary | ICD-10-CM | POA: Diagnosis present

## 2023-06-08 DIAGNOSIS — Z6830 Body mass index (BMI) 30.0-30.9, adult: Secondary | ICD-10-CM

## 2023-06-08 DIAGNOSIS — Z9889 Other specified postprocedural states: Secondary | ICD-10-CM | POA: Diagnosis present

## 2023-06-08 DIAGNOSIS — J449 Chronic obstructive pulmonary disease, unspecified: Secondary | ICD-10-CM

## 2023-06-08 DIAGNOSIS — Z87440 Personal history of urinary (tract) infections: Secondary | ICD-10-CM

## 2023-06-08 DIAGNOSIS — Z8619 Personal history of other infectious and parasitic diseases: Secondary | ICD-10-CM

## 2023-06-08 DIAGNOSIS — G4700 Insomnia, unspecified: Secondary | ICD-10-CM | POA: Diagnosis present

## 2023-06-08 DIAGNOSIS — Z7951 Long term (current) use of inhaled steroids: Secondary | ICD-10-CM

## 2023-06-08 DIAGNOSIS — T84093A Other mechanical complication of internal left knee prosthesis, initial encounter: Secondary | ICD-10-CM

## 2023-06-08 DIAGNOSIS — F0390 Unspecified dementia without behavioral disturbance: Secondary | ICD-10-CM | POA: Diagnosis present

## 2023-06-08 DIAGNOSIS — D649 Anemia, unspecified: Secondary | ICD-10-CM | POA: Diagnosis present

## 2023-06-08 DIAGNOSIS — R5381 Other malaise: Secondary | ICD-10-CM | POA: Diagnosis not present

## 2023-06-08 DIAGNOSIS — G8918 Other acute postprocedural pain: Secondary | ICD-10-CM | POA: Diagnosis not present

## 2023-06-08 DIAGNOSIS — K7469 Other cirrhosis of liver: Secondary | ICD-10-CM | POA: Diagnosis present

## 2023-06-08 DIAGNOSIS — S8780XA Crushing injury of unspecified lower leg, initial encounter: Secondary | ICD-10-CM | POA: Diagnosis not present

## 2023-06-08 DIAGNOSIS — J99 Respiratory disorders in diseases classified elsewhere: Secondary | ICD-10-CM | POA: Diagnosis present

## 2023-06-08 DIAGNOSIS — K5900 Constipation, unspecified: Secondary | ICD-10-CM | POA: Diagnosis present

## 2023-06-08 DIAGNOSIS — Z7401 Bed confinement status: Secondary | ICD-10-CM | POA: Diagnosis not present

## 2023-06-08 DIAGNOSIS — K2100 Gastro-esophageal reflux disease with esophagitis, without bleeding: Secondary | ICD-10-CM | POA: Diagnosis present

## 2023-06-08 DIAGNOSIS — B182 Chronic viral hepatitis C: Secondary | ICD-10-CM | POA: Diagnosis present

## 2023-06-08 DIAGNOSIS — M1732 Unilateral post-traumatic osteoarthritis, left knee: Secondary | ICD-10-CM | POA: Diagnosis not present

## 2023-06-08 DIAGNOSIS — N400 Enlarged prostate without lower urinary tract symptoms: Secondary | ICD-10-CM | POA: Diagnosis present

## 2023-06-08 DIAGNOSIS — E785 Hyperlipidemia, unspecified: Secondary | ICD-10-CM | POA: Diagnosis present

## 2023-06-08 DIAGNOSIS — Z87891 Personal history of nicotine dependence: Secondary | ICD-10-CM | POA: Diagnosis not present

## 2023-06-08 DIAGNOSIS — M25562 Pain in left knee: Secondary | ICD-10-CM | POA: Diagnosis not present

## 2023-06-08 DIAGNOSIS — A6002 Herpesviral infection of other male genital organs: Secondary | ICD-10-CM | POA: Diagnosis present

## 2023-06-08 HISTORY — PX: REIMPLANTATION OF TOTAL KNEE: SHX6052

## 2023-06-08 LAB — TYPE AND SCREEN
ABO/RH(D): O POS
Antibody Screen: NEGATIVE

## 2023-06-08 LAB — SURGICAL PCR SCREEN
MRSA, PCR: NEGATIVE
Staphylococcus aureus: NEGATIVE

## 2023-06-08 SURGERY — REVISION, TOTAL ARTHROPLASTY, KNEE
Anesthesia: Regional | Site: Knee | Laterality: Left

## 2023-06-08 MED ORDER — ONDANSETRON HCL 4 MG/2ML IJ SOLN
INTRAMUSCULAR | Status: DC | PRN
Start: 2023-06-08 — End: 2023-06-08
  Administered 2023-06-08: 4 mg via INTRAVENOUS

## 2023-06-08 MED ORDER — CARVEDILOL 6.25 MG PO TABS
6.2500 mg | ORAL_TABLET | Freq: Two times a day (BID) | ORAL | Status: DC
  Administered 2023-06-08 – 2023-06-10 (×3): 6.25 mg via ORAL
  Filled 2023-06-08 (×4): qty 1

## 2023-06-08 MED ORDER — ACETAMINOPHEN 325 MG PO TABS: 325.0000 mg | ORAL_TABLET | Freq: Four times a day (QID) | ORAL | Status: DC | PRN

## 2023-06-08 MED ORDER — SODIUM CHLORIDE 0.9 % IV SOLN
INTRAVENOUS | Status: DC | PRN
  Administered 2023-06-08: 80 mL

## 2023-06-08 MED ORDER — ONDANSETRON HCL 4 MG/2ML IJ SOLN: 4.0000 mg | Freq: Once | INTRAMUSCULAR | Status: DC | PRN

## 2023-06-08 MED ORDER — BUPIVACAINE LIPOSOME 1.3 % IJ SUSP: 20.0000 mL | Freq: Once | INTRAMUSCULAR | Status: DC

## 2023-06-08 MED ORDER — PROPOFOL 500 MG/50ML IV EMUL
INTRAVENOUS | Status: DC | PRN
  Administered 2023-06-08: 75 ug/kg/min via INTRAVENOUS

## 2023-06-08 MED ORDER — ONDANSETRON HCL 4 MG/2ML IJ SOLN
INTRAMUSCULAR | Status: AC
  Filled 2023-06-08: qty 2

## 2023-06-08 MED ORDER — LIDOCAINE HCL (PF) 2 % IJ SOLN
INTRAMUSCULAR | Status: AC
  Filled 2023-06-08: qty 5

## 2023-06-08 MED ORDER — PANTOPRAZOLE SODIUM 40 MG PO TBEC
40.0000 mg | DELAYED_RELEASE_TABLET | Freq: Every day | ORAL | Status: DC
  Administered 2023-06-09 – 2023-06-10 (×2): 40 mg via ORAL
  Filled 2023-06-08 (×2): qty 1

## 2023-06-08 MED ORDER — FENTANYL CITRATE PF 50 MCG/ML IJ SOSY
50.0000 ug | PREFILLED_SYRINGE | INTRAMUSCULAR | Status: DC
Start: 2023-06-08 — End: 2023-06-08
  Administered 2023-06-08: 50 ug via INTRAVENOUS
  Filled 2023-06-08: qty 2

## 2023-06-08 MED ORDER — TRAZODONE HCL 100 MG PO TABS
100.0000 mg | ORAL_TABLET | Freq: Every day | ORAL | Status: DC
  Administered 2023-06-08 – 2023-06-09 (×2): 100 mg via ORAL
  Filled 2023-06-08 (×2): qty 1

## 2023-06-08 MED ORDER — SULFAMETHOXAZOLE-TRIMETHOPRIM 400-80 MG PO TABS
1.0000 | ORAL_TABLET | Freq: Every day | ORAL | Status: DC
  Administered 2023-06-09 – 2023-06-10 (×2): 1 via ORAL
  Filled 2023-06-08 (×2): qty 1

## 2023-06-08 MED ORDER — SUGAMMADEX SODIUM 200 MG/2ML IV SOLN
INTRAVENOUS | Status: DC | PRN
  Administered 2023-06-08: 200 mg via INTRAVENOUS

## 2023-06-08 MED ORDER — TRAMADOL HCL 50 MG PO TABS
50.0000 mg | ORAL_TABLET | Freq: Four times a day (QID) | ORAL | Status: DC | PRN
  Administered 2023-06-09 – 2023-06-10 (×2): 100 mg via ORAL
  Filled 2023-06-08 (×2): qty 2

## 2023-06-08 MED ORDER — MENTHOL 3 MG MT LOZG: 1.0000 | LOZENGE | OROMUCOSAL | Status: DC | PRN

## 2023-06-08 MED ORDER — LACTATED RINGERS IV SOLN: INTRAVENOUS | Status: DC

## 2023-06-08 MED ORDER — CEFAZOLIN SODIUM-DEXTROSE 2-4 GM/100ML-% IV SOLN
2.0000 g | INTRAVENOUS | Status: AC
  Administered 2023-06-08: 2 g via INTRAVENOUS
  Filled 2023-06-08: qty 100

## 2023-06-08 MED ORDER — SODIUM CHLORIDE 0.9 % IV SOLN: INTRAVENOUS | Status: DC

## 2023-06-08 MED ORDER — POVIDONE-IODINE 10 % EX SWAB: 2.0000 | Freq: Once | CUTANEOUS | Status: DC

## 2023-06-08 MED ORDER — FLEET ENEMA RE ENEM: 1.0000 | ENEMA | Freq: Once | RECTAL | Status: DC | PRN

## 2023-06-08 MED ORDER — DOCUSATE SODIUM 100 MG PO CAPS
100.0000 mg | ORAL_CAPSULE | Freq: Two times a day (BID) | ORAL | Status: DC
  Administered 2023-06-08 – 2023-06-10 (×4): 100 mg via ORAL
  Filled 2023-06-08 (×4): qty 1

## 2023-06-08 MED ORDER — BISACODYL 10 MG RE SUPP: 10.0000 mg | Freq: Every day | RECTAL | Status: DC | PRN

## 2023-06-08 MED ORDER — DEXAMETHASONE SODIUM PHOSPHATE 4 MG/ML IJ SOLN
INTRAMUSCULAR | Status: DC | PRN
  Administered 2023-06-08: 8 mg via INTRAVENOUS

## 2023-06-08 MED ORDER — ROCURONIUM BROMIDE 100 MG/10ML IV SOLN
INTRAVENOUS | Status: DC | PRN
  Administered 2023-06-08: 100 mg via INTRAVENOUS

## 2023-06-08 MED ORDER — APIXABAN 2.5 MG PO TABS
2.5000 mg | ORAL_TABLET | Freq: Two times a day (BID) | ORAL | Status: DC
  Administered 2023-06-09 – 2023-06-10 (×3): 2.5 mg via ORAL
  Filled 2023-06-08 (×3): qty 1

## 2023-06-08 MED ORDER — METOCLOPRAMIDE HCL 5 MG/ML IJ SOLN: 5.0000 mg | Freq: Three times a day (TID) | INTRAMUSCULAR | Status: DC | PRN

## 2023-06-08 MED ORDER — VALACYCLOVIR HCL 500 MG PO TABS
500.0000 mg | ORAL_TABLET | Freq: Every evening | ORAL | Status: DC
  Administered 2023-06-08 – 2023-06-09 (×2): 500 mg via ORAL
  Filled 2023-06-08 (×2): qty 1

## 2023-06-08 MED ORDER — HYDROMORPHONE HCL 1 MG/ML IJ SOLN: 0.5000 mg | INTRAMUSCULAR | Status: DC | PRN

## 2023-06-08 MED ORDER — PROPOFOL 10 MG/ML IV BOLUS
INTRAVENOUS | Status: DC | PRN
  Administered 2023-06-08: 150 mg via INTRAVENOUS

## 2023-06-08 MED ORDER — PHENOL 1.4 % MT LIQD
1.0000 | OROMUCOSAL | Status: DC | PRN
Start: 2023-06-08 — End: 2023-06-10

## 2023-06-08 MED ORDER — ONDANSETRON HCL 4 MG PO TABS: 4.0000 mg | ORAL_TABLET | Freq: Four times a day (QID) | ORAL | Status: DC | PRN

## 2023-06-08 MED ORDER — TAMSULOSIN HCL 0.4 MG PO CAPS
0.4000 mg | ORAL_CAPSULE | Freq: Every evening | ORAL | Status: DC
  Administered 2023-06-08 – 2023-06-09 (×2): 0.4 mg via ORAL
  Filled 2023-06-08 (×2): qty 1

## 2023-06-08 MED ORDER — METHOCARBAMOL 500 MG PO TABS: 500.0000 mg | ORAL_TABLET | Freq: Four times a day (QID) | ORAL | Status: DC | PRN

## 2023-06-08 MED ORDER — FENTANYL CITRATE (PF) 100 MCG/2ML IJ SOLN
INTRAMUSCULAR | Status: AC
  Filled 2023-06-08: qty 2

## 2023-06-08 MED ORDER — AMISULPRIDE (ANTIEMETIC) 5 MG/2ML IV SOLN: 10.0000 mg | Freq: Once | INTRAVENOUS | Status: DC | PRN

## 2023-06-08 MED ORDER — OXYCODONE HCL 5 MG PO TABS
5.0000 mg | ORAL_TABLET | ORAL | Status: DC | PRN
  Administered 2023-06-08 – 2023-06-09 (×3): 10 mg via ORAL
  Filled 2023-06-08 (×4): qty 2

## 2023-06-08 MED ORDER — PROPOFOL 10 MG/ML IV BOLUS
INTRAVENOUS | Status: AC
  Filled 2023-06-08: qty 20

## 2023-06-08 MED ORDER — SODIUM CHLORIDE 0.9 % IR SOLN
Status: DC | PRN
  Administered 2023-06-08: 3000 mL

## 2023-06-08 MED ORDER — ALBUTEROL SULFATE (2.5 MG/3ML) 0.083% IN NEBU: 2.5000 mg | INHALATION_SOLUTION | Freq: Four times a day (QID) | RESPIRATORY_TRACT | Status: DC | PRN

## 2023-06-08 MED ORDER — 0.9 % SODIUM CHLORIDE (POUR BTL) OPTIME
TOPICAL | Status: DC | PRN
  Administered 2023-06-08: 1000 mL

## 2023-06-08 MED ORDER — CEFAZOLIN SODIUM-DEXTROSE 2-4 GM/100ML-% IV SOLN
2.0000 g | Freq: Four times a day (QID) | INTRAVENOUS | Status: AC
  Administered 2023-06-08 – 2023-06-09 (×2): 2 g via INTRAVENOUS
  Filled 2023-06-08 (×2): qty 100

## 2023-06-08 MED ORDER — BUPIVACAINE LIPOSOME 1.3 % IJ SUSP
INTRAMUSCULAR | Status: AC
  Filled 2023-06-08: qty 20

## 2023-06-08 MED ORDER — BICTEGRAVIR-EMTRICITAB-TENOFOV 50-200-25 MG PO TABS
1.0000 | ORAL_TABLET | Freq: Every day | ORAL | Status: DC
  Administered 2023-06-09 – 2023-06-10 (×2): 1 via ORAL
  Filled 2023-06-08 (×2): qty 1

## 2023-06-08 MED ORDER — OXYCODONE HCL 5 MG/5ML PO SOLN: 5.0000 mg | Freq: Once | ORAL | Status: DC | PRN

## 2023-06-08 MED ORDER — ALBUTEROL SULFATE HFA 108 (90 BASE) MCG/ACT IN AERS: 2.0000 | INHALATION_SPRAY | Freq: Four times a day (QID) | RESPIRATORY_TRACT | Status: DC | PRN

## 2023-06-08 MED ORDER — FLUTICASONE PROPIONATE DISKUS 100 MCG/ACT IN AEPB: 1.0000 | INHALATION_SPRAY | Freq: Two times a day (BID) | RESPIRATORY_TRACT | Status: DC

## 2023-06-08 MED ORDER — DEXAMETHASONE SODIUM PHOSPHATE 10 MG/ML IJ SOLN
INTRAMUSCULAR | Status: AC
Start: 2023-06-08 — End: ?
  Filled 2023-06-08: qty 1

## 2023-06-08 MED ORDER — CHLORHEXIDINE GLUCONATE 0.12 % MT SOLN
15.0000 mL | Freq: Once | OROMUCOSAL | Status: DC
Start: 2023-06-08 — End: 2023-06-08

## 2023-06-08 MED ORDER — ORAL CARE MOUTH RINSE: 15.0000 mL | Freq: Once | OROMUCOSAL | Status: DC

## 2023-06-08 MED ORDER — FENTANYL CITRATE (PF) 100 MCG/2ML IJ SOLN
INTRAMUSCULAR | Status: DC | PRN
  Administered 2023-06-08 (×2): 50 ug via INTRAVENOUS

## 2023-06-08 MED ORDER — HYDROMORPHONE HCL 1 MG/ML IJ SOLN
INTRAMUSCULAR | Status: AC
  Filled 2023-06-08: qty 1

## 2023-06-08 MED ORDER — POLYETHYLENE GLYCOL 3350 17 G PO PACK: 17.0000 g | PACK | Freq: Every day | ORAL | Status: DC | PRN

## 2023-06-08 MED ORDER — DEXAMETHASONE SODIUM PHOSPHATE 10 MG/ML IJ SOLN
8.0000 mg | Freq: Once | INTRAMUSCULAR | Status: DC
Start: 2023-06-08 — End: 2023-06-08

## 2023-06-08 MED ORDER — DEXAMETHASONE SODIUM PHOSPHATE 10 MG/ML IJ SOLN
10.0000 mg | Freq: Once | INTRAMUSCULAR | Status: AC
  Administered 2023-06-09: 10 mg via INTRAVENOUS
  Filled 2023-06-08: qty 1

## 2023-06-08 MED ORDER — VANCOMYCIN HCL 1000 MG IV SOLR
INTRAVENOUS | Status: AC
Start: 2023-06-08 — End: ?
  Filled 2023-06-08: qty 20

## 2023-06-08 MED ORDER — METHOCARBAMOL 1000 MG/10ML IJ SOLN: 500.0000 mg | Freq: Four times a day (QID) | INTRAMUSCULAR | Status: DC | PRN

## 2023-06-08 MED ORDER — PROPOFOL 1000 MG/100ML IV EMUL
INTRAVENOUS | Status: AC
Start: 2023-06-08 — End: ?
  Filled 2023-06-08: qty 100

## 2023-06-08 MED ORDER — PHENYLEPHRINE 80 MCG/ML (10ML) SYRINGE FOR IV PUSH (FOR BLOOD PRESSURE SUPPORT)
PREFILLED_SYRINGE | INTRAVENOUS | Status: DC | PRN
  Administered 2023-06-08 (×2): 80 ug via INTRAVENOUS

## 2023-06-08 MED ORDER — METOCLOPRAMIDE HCL 5 MG PO TABS: 5.0000 mg | ORAL_TABLET | Freq: Three times a day (TID) | ORAL | Status: DC | PRN

## 2023-06-08 MED ORDER — ACETAMINOPHEN 10 MG/ML IV SOLN
1000.0000 mg | Freq: Once | INTRAVENOUS | Status: AC
  Administered 2023-06-08: 1000 mg via INTRAVENOUS
  Filled 2023-06-08: qty 100

## 2023-06-08 MED ORDER — SODIUM CHLORIDE (PF) 0.9 % IJ SOLN
INTRAMUSCULAR | Status: AC
Start: 2023-06-08 — End: ?
  Filled 2023-06-08: qty 10

## 2023-06-08 MED ORDER — ACETAMINOPHEN 500 MG PO TABS
1000.0000 mg | ORAL_TABLET | Freq: Four times a day (QID) | ORAL | Status: AC
  Administered 2023-06-08 – 2023-06-09 (×4): 1000 mg via ORAL
  Filled 2023-06-08 (×4): qty 2

## 2023-06-08 MED ORDER — OXYCODONE HCL 5 MG PO TABS: 5.0000 mg | ORAL_TABLET | Freq: Once | ORAL | Status: DC | PRN

## 2023-06-08 MED ORDER — BUDESONIDE 0.25 MG/2ML IN SUSP
0.2500 mg | Freq: Two times a day (BID) | RESPIRATORY_TRACT | Status: DC
  Administered 2023-06-08 – 2023-06-10 (×4): 0.25 mg via RESPIRATORY_TRACT
  Filled 2023-06-08 (×4): qty 2

## 2023-06-08 MED ORDER — LIDOCAINE HCL (CARDIAC) PF 100 MG/5ML IV SOSY
PREFILLED_SYRINGE | INTRAVENOUS | Status: DC | PRN
  Administered 2023-06-08: 100 mg via INTRAVENOUS

## 2023-06-08 MED ORDER — ONDANSETRON HCL 4 MG/2ML IJ SOLN: 4.0000 mg | Freq: Four times a day (QID) | INTRAMUSCULAR | Status: DC | PRN

## 2023-06-08 MED ORDER — DIPHENHYDRAMINE HCL 12.5 MG/5ML PO ELIX: 12.5000 mg | ORAL_SOLUTION | ORAL | Status: DC | PRN

## 2023-06-08 MED ORDER — TRANEXAMIC ACID-NACL 1000-0.7 MG/100ML-% IV SOLN
1000.0000 mg | INTRAVENOUS | Status: AC
  Administered 2023-06-08: 1000 mg via INTRAVENOUS
  Filled 2023-06-08: qty 100

## 2023-06-08 MED ORDER — SODIUM CHLORIDE (PF) 0.9 % IJ SOLN
INTRAMUSCULAR | Status: AC
  Filled 2023-06-08: qty 50

## 2023-06-08 MED ORDER — HYDROMORPHONE HCL 1 MG/ML IJ SOLN
0.2500 mg | INTRAMUSCULAR | Status: DC | PRN
  Administered 2023-06-08 (×2): 0.5 mg via INTRAVENOUS

## 2023-06-08 SURGICAL SUPPLY — 53 items
BAG COUNTER SPONGE SURGICOUNT (BAG) IMPLANT
BAG ZIPLOCK 12X15 (MISCELLANEOUS) ×1 IMPLANT
BASE TIB KNEE REV RP ATUNE SZ6 (Knees) IMPLANT
BLADE SAG 18X100X1.27 (BLADE) ×1 IMPLANT
BLADE SAW SGTL 11.0X1.19X90.0M (BLADE) ×1 IMPLANT
BNDG ELASTIC 6INX 5YD STR LF (GAUZE/BANDAGES/DRESSINGS) ×1 IMPLANT
BNDG ELASTIC 6X10 VLCR STRL LF (GAUZE/BANDAGES/DRESSINGS) IMPLANT
CEMENT BONE GENTAMICIN (Cement) ×2 IMPLANT
CEMENT BONE GENTAMICIN 40 (Cement) ×3 IMPLANT
COVER SURGICAL LIGHT HANDLE (MISCELLANEOUS) ×1 IMPLANT
CUFF TRNQT CYL 34X4.125X (TOURNIQUET CUFF) ×1 IMPLANT
DERMABOND ADVANCED .7 DNX12 (GAUZE/BANDAGES/DRESSINGS) ×1 IMPLANT
DRAPE U-SHAPE 47X51 STRL (DRAPES) ×1 IMPLANT
DRSG ADAPTIC 3X8 NADH LF (GAUZE/BANDAGES/DRESSINGS) IMPLANT
DRSG AQUACEL AG ADV 3.5X10 (GAUZE/BANDAGES/DRESSINGS) ×1 IMPLANT
DURAPREP 26ML APPLICATOR (WOUND CARE) ×1 IMPLANT
ELECT REM PT RETURN 15FT ADLT (MISCELLANEOUS) ×1 IMPLANT
EVACUATOR 1/8 PVC DRAIN (DRAIN) IMPLANT
GAUZE PAD ABD 8X10 STRL (GAUZE/BANDAGES/DRESSINGS) IMPLANT
GAUZE SPONGE 4X4 12PLY STRL (GAUZE/BANDAGES/DRESSINGS) IMPLANT
GLOVE BIO SURGEON STRL SZ7 (GLOVE) IMPLANT
GLOVE BIO SURGEON STRL SZ8 (GLOVE) ×1 IMPLANT
GLOVE BIOGEL PI IND STRL 7.0 (GLOVE) ×1 IMPLANT
GLOVE BIOGEL PI IND STRL 8 (GLOVE) ×1 IMPLANT
GOWN STRL REUS W/ TWL LRG LVL3 (GOWN DISPOSABLE) ×2 IMPLANT
HOOD PEEL AWAY T7 (MISCELLANEOUS) IMPLANT
IMMOBILIZER KNEE 20 (SOFTGOODS) ×1 IMPLANT
IMMOBILIZER KNEE 20 THIGH 36 (SOFTGOODS) ×1 IMPLANT
INSERT SMALL LEFT SROM (Orthopedic Implant) IMPLANT
KIT TURNOVER KIT A (KITS) IMPLANT
LINER TIB REV ATTUN KNEE 12 SM (Liner) IMPLANT
MANIFOLD NEPTUNE II (INSTRUMENTS) ×1 IMPLANT
NS IRRIG 1000ML POUR BTL (IV SOLUTION) ×1 IMPLANT
PADDING CAST ABS COTTON 6X4 NS (CAST SUPPLIES) IMPLANT
PADDING CAST COTTON 6X4 STRL (CAST SUPPLIES) ×2 IMPLANT
PIN STEINMAN FIXATION KNEE (PIN) IMPLANT
PROTECTOR NERVE ULNAR (MISCELLANEOUS) ×1 IMPLANT
SET HNDPC FAN SPRY TIP SCT (DISPOSABLE) ×1 IMPLANT
SLEEVE FEM UNIV FULL PRO SZ34 (Sleeve) IMPLANT
SLEEVE TIB ATTUNE FP 61 (Knees) IMPLANT
STAPLER SKIN PROX WIDE 3.9 (STAPLE) IMPLANT
STEM STR ATTUNE PF 20X60 (Knees) IMPLANT
STEM UNIVERSAL REVISION 75X18 (Stem) IMPLANT
SUT MNCRL AB 4-0 PS2 18 (SUTURE) ×1 IMPLANT
SUT PDS AB 1 CT 36 (SUTURE) IMPLANT
SUT VIC AB 2-0 CT1 TAPERPNT 27 (SUTURE) ×3 IMPLANT
SUTURE STRATFX 0 PDS 27 VIOLET (SUTURE) ×1 IMPLANT
SWAB COLLECTION DEVICE MRSA (MISCELLANEOUS) ×1 IMPLANT
SWAB CULTURE ESWAB REG 1ML (MISCELLANEOUS) ×1 IMPLANT
TOWER CARTRIDGE SMART MIX (DISPOSABLE) ×1 IMPLANT
TRAY FOLEY MTR SLVR 16FR STAT (SET/KITS/TRAYS/PACK) IMPLANT
WATER STERILE IRR 1000ML POUR (IV SOLUTION) ×1 IMPLANT
WRAP KNEE MAXI GEL POST OP (GAUZE/BANDAGES/DRESSINGS) ×1 IMPLANT

## 2023-06-08 NOTE — Brief Op Note (Signed)
 06/08/2023  4:37 PM  PATIENT:  Logan Levine  73 y.o. male  PRE-OPERATIVE DIAGNOSIS:  S/P Resection arthroplasty left knee  POST-OPERATIVE DIAGNOSIS:  S/P Resection arthroplasty left knee  PROCEDURE:  Procedure(s): REIMPLANTATION OF LEFT TOTAL KNEE ARTHROPLASTY (Left)  SURGEON:  Surgeons and Role:    Liliane Rei, MD - Primary  PHYSICIAN ASSISTANT:   ASSISTANTS: Brinton Canavan, PA-C   ANESTHESIA:   regional and general  EBL:  300 mL   BLOOD ADMINISTERED:none  DRAINS: (Medium) Hemovact drain(s) in the left knee with  Suction Open   LOCAL MEDICATIONS USED:  OTHER Exparel   COUNTS:  YES  TOURNIQUET:   Total Tourniquet Time Documented: Thigh (Left) - 118 minutes Total: Thigh (Left) - 118 minutes   DICTATION: .Other Dictation: Dictation Number [id number]  PLAN OF CARE: Admit to inpatient   PATIENT DISPOSITION:  PACU - hemodynamically stable.

## 2023-06-08 NOTE — Interval H&P Note (Signed)
 History and Physical Interval Note:  06/08/2023 10:33 AM  Logan Levine  has presented today for surgery, with the diagnosis of reimplantation of left total knee arthroplasty.  The various methods of treatment have been discussed with the patient and family. After consideration of risks, benefits and other options for treatment, the patient has consented to  Procedure(s): REVISION, TOTAL ARTHROPLASTY, KNEE (Left) as a surgical intervention.  The patient's history has been reviewed, patient examined, no change in status, stable for surgery.  I have reviewed the patient's chart and labs.  Questions were answered to the patient's satisfaction.     Logan Levine

## 2023-06-08 NOTE — Transfer of Care (Signed)
 Immediate Anesthesia Transfer of Care Note  Patient: Logan Levine  Procedure(s) Performed: REIMPLANTATION OF LEFT TOTAL KNEE ARTHROPLASTY (Left: Knee)  Patient Location: PACU  Anesthesia Type:GA combined with regional for post-op pain  Level of Consciousness: drowsy  Airway & Oxygen  Therapy: Patient Spontanous Breathing and Patient connected to face mask oxygen   Post-op Assessment: Report given to RN and Post -op Vital signs reviewed and unstable, Anesthesiologist notified  Post vital signs: Reviewed and stable  Last Vitals:  Vitals Value Taken Time  BP 115/99 06/08/23 1707  Temp    Pulse 65 06/08/23 1710  Resp 17 06/08/23 1710  SpO2 94 % 06/08/23 1710  Vitals shown include unfiled device data.  Last Pain:  Vitals:   06/08/23 1340  TempSrc:   PainSc: 0-No pain         Complications: No notable events documented.

## 2023-06-08 NOTE — Anesthesia Postprocedure Evaluation (Signed)
 Anesthesia Post Note  Patient: Logan Levine  Procedure(s) Performed: REIMPLANTATION OF LEFT TOTAL KNEE ARTHROPLASTY (Left: Knee)     Patient location during evaluation: PACU Anesthesia Type: Regional and General Level of consciousness: awake and alert Pain management: pain level controlled Vital Signs Assessment: post-procedure vital signs reviewed and stable Respiratory status: spontaneous breathing, nonlabored ventilation, respiratory function stable and patient connected to nasal cannula oxygen  Cardiovascular status: blood pressure returned to baseline and stable Postop Assessment: no apparent nausea or vomiting Anesthetic complications: no  No notable events documented.  Last Vitals:  Vitals:   06/08/23 1745 06/08/23 1800  BP:  (!) 149/74  Pulse: (!) 55 (!) 49  Resp: 15 11  Temp:    SpO2: 98% 98%    Last Pain:  Vitals:   06/08/23 1800  TempSrc:   PainSc: Asleep                 Rosalita Combe

## 2023-06-08 NOTE — Discharge Instructions (Addendum)
 Logan Rei, MD Total Joint Specialist [facility] [address], [address] [place], [place] [address] [phone]  TOTAL KNEE REIMPLANTATION POSTOPERATIVE DIRECTIONS  Knee Rehabilitation, Guidelines Following Surgery  Results after knee surgery are often greatly improved when you follow the exercise, range of motion and muscle strengthening exercises prescribed by your doctor. Safety measures are also important to protect the knee from further injury. If any of these exercises cause you to have increased pain or swelling in your knee joint, decrease the amount until you are comfortable again and slowly increase them. If you have problems or questions, call your caregiver or physical therapist for advice.   BLOOD CLOT PREVENTION Take a 2.5 mg Eliquis  twice a day for three weeks following surgery. Then resume an 81 mg Aspirin  once a day You may resume your vitamins/supplements once you have discontinued the Xarelto. Do not take any NSAIDs (Advil, Aleve, Ibuprofen, Meloxicam, etc.) until you have discontinued the Eliquis .  HOME CARE INSTRUCTIONS  Remove items at home which could result in a fall. This includes throw rugs or furniture in walking pathways.  ICE to the affected knee as much as tolerated. Icing helps control swelling. If the swelling is well controlled you will be more comfortable and rehab easier. Continue to use ice on the knee for pain and swelling from surgery. You may notice swelling that will progress down to the foot and ankle. This is normal after surgery. Elevate the leg when you are not up walking on it.    Continue to use the breathing machine which will help keep your temperature down. It is common for your temperature to cycle up and down following surgery, especially at night when you are not up moving around and exerting yourself. The breathing machine keeps your lungs expanded and your temperature down. Do not place pillow under the operative  knee, focus on keeping the knee straight while resting  DIET You may resume your previous home diet once you are discharged from the hospital.  DRESSING / WOUND CARE / SHOWERING Keep your bulky bandage on for 2 days. On the third post-operative day you may remove the Ace bandage and gauze. There is a waterproof adhesive bandage on your skin which will stay in place until your first follow-up appointment. Once you remove this you will not need to place another bandage You may begin showering 3 days following surgery, but do not submerge the incision under water .  ACTIVITY For the first 5 days, the key is rest and control of pain and swelling Do your home exercises twice a day starting on post-operative day 3. On the days you go to physical therapy, just do the home exercises once that day. You should rest, ice and elevate the leg for 50 minutes out of every hour. Get up and walk/stretch for 10 minutes per hour. After 5 days you can increase your activity slowly as tolerated. Walk with your walker as instructed. Use the walker until you are comfortable transitioning to a cane. Walk with the cane in the opposite hand of the operative leg. You may discontinue the cane once you are comfortable and walking steadily. Avoid periods of inactivity such as sitting longer than an hour when not asleep. This helps prevent blood clots.  You may discontinue the knee immobilizer once you are able to perform a straight leg raise while lying down. You may resume a sexual relationship in one month or when given the OK by your doctor.  You  may return to work once you are cleared by your doctor.  Do not drive a car for 6 weeks or until released by your surgeon.  Do not drive while taking narcotics.  TED HOSE STOCKINGS Wear the elastic stockings on both legs for three weeks following surgery during the day. You may remove them at night for sleeping.  WEIGHT BEARING Weight bearing as tolerated with assist device  (walker, cane, etc) as directed, use it as long as suggested by your surgeon or therapist, typically at least 4-6 weeks.  POSTOPERATIVE CONSTIPATION PROTOCOL Constipation - defined medically as fewer than three stools per week and severe constipation as less than one stool per week.  One of the most common issues patients have following surgery is constipation.  Even if you have a regular bowel pattern at home, your normal regimen is likely to be disrupted due to multiple reasons following surgery.  Combination of anesthesia, postoperative narcotics, change in appetite and fluid intake all can affect your bowels.  In order to avoid complications following surgery, here are some recommendations in order to help you during your recovery period.  Colace (docusate) - Pick up an over-the-counter form of Colace or another stool softener and take twice a day as long as you are requiring postoperative pain medications.  Take with a full glass of water  daily.  If you experience loose stools or diarrhea, hold the colace until you stool forms back up. If your symptoms do not get better within 1 week or if they get worse, check with your doctor. Dulcolax (bisacodyl ) - Pick up over-the-counter and take as directed by the product packaging as needed to assist with the movement of your bowels.  Take with a full glass of water .  Use this product as needed if not relieved by Colace only.  MiraLax  (polyethylene glycol) - Pick up over-the-counter to have on hand. MiraLax  is a solution that will increase the amount of water  in your bowels to assist with bowel movements.  Take as directed and can mix with a glass of water , juice, soda, coffee, or tea. Take if you go more than two days without a movement. Do not use MiraLax  more than once per day. Call your doctor if you are still constipated or irregular after using this medication for 7 days in a row.  If you continue to have problems with postoperative constipation, please  contact the office for further assistance and recommendations.  If you experience "the worst abdominal pain ever" or develop nausea or vomiting, please contact the office immediatly for further recommendations for treatment.  ITCHING If you experience itching with your medications, try taking only a single pain pill, or even half a pain pill at a time.  You can also use Benadryl  over the counter for itching or also to help with sleep.   MEDICATIONS See your medication summary on the "After Visit Summary" that the nursing staff will review with you prior to discharge.  You may have some home medications which will be placed on hold until you complete the course of blood thinner medication.  It is important for you to complete the blood thinner medication as prescribed by your surgeon.  Continue your approved medications as instructed at time of discharge.  PRECAUTIONS If you experience chest pain or shortness of breath - call 911 immediately for transfer to the hospital emergency department.  If you develop a fever greater that 101 F, purulent drainage from wound, increased redness or drainage from wound,  foul odor from the wound/dressing, or calf pain - CONTACT YOUR SURGEON.                                                   FOLLOW-UP APPOINTMENTS Make sure you keep all of your appointments after your operation with your surgeon and caregivers. You should call the office at the above phone number and make an appointment for approximately two weeks after the date of your surgery or on the date instructed by your surgeon outlined in the "After Visit Summary".  RANGE OF MOTION AND STRENGTHENING EXERCISES  Rehabilitation of the knee is important following a knee injury or an operation. After just a few days of immobilization, the muscles of the thigh which control the knee become weakened and shrink (atrophy). Knee exercises are designed to build up the tone and strength of the thigh muscles and to  improve knee motion. Often times heat used for twenty to thirty minutes before working out will loosen up your tissues and help with improving the range of motion but do not use heat for the first two weeks following surgery. These exercises can be done on a training (exercise) mat, on the floor, on a table or on a bed. Use what ever works the best and is most comfortable for you Knee exercises include:  Leg Lifts - While your knee is still immobilized in a splint or cast, you can do straight leg raises. Lift the leg to 60 degrees, hold for 3 sec, and slowly lower the leg. Repeat 10-20 times 2-3 times daily. Perform this exercise against resistance later as your knee gets better.  Quad and Hamstring Sets - Tighten up the muscle on the front of the thigh (Quad) and hold for 5-10 sec. Repeat this 10-20 times hourly. Hamstring sets are done by pushing the foot backward against an object and holding for 5-10 sec. Repeat as with quad sets.  Leg Slides: Lying on your back, slowly slide your foot toward your buttocks, bending your knee up off the floor (only go as far as is comfortable). Then slowly slide your foot back down until your leg is flat on the floor again. Angel Wings: Lying on your back spread your legs to the side as far apart as you can without causing discomfort.  A rehabilitation program following serious knee injuries can speed recovery and prevent re-injury in the future due to weakened muscles. Contact your doctor or a physical therapist for more information on knee rehabilitation.   POST-OPERATIVE OPIOID TAPER INSTRUCTIONS: It is important to wean off of your opioid medication as soon as possible. If you do not need pain medication after your surgery it is ok to stop day one. Opioids include: Codeine, Hydrocodone (Norco, Vicodin), Oxycodone (Percocet, oxycontin ) and hydromorphone  amongst others.  Long term and even short term use of opiods can cause: Increased pain  response Dependence Constipation Depression Respiratory depression And more.  Withdrawal symptoms can include Flu like symptoms Nausea, vomiting And more Techniques to manage these symptoms Hydrate well Eat regular healthy meals Stay active Use relaxation techniques(deep breathing, meditating, yoga) Do Not substitute Alcohol to help with tapering If you have been on opioids for less than two weeks and do not have pain than it is ok to stop all together.  Plan to wean off of opioids This plan should start within  one week post op of your joint replacement. Maintain the same interval or time between taking each dose and first decrease the dose.  Cut the total daily intake of opioids by one tablet each day Next start to increase the time between doses. The last dose that should be eliminated is the evening dose.   IF YOU ARE TRANSFERRED TO A SKILLED REHAB FACILITY If the patient is transferred to a skilled rehab facility following release from the hospital, a list of the current medications will be sent to the facility for the patient to continue.  When discharged from the skilled rehab facility, please have the facility set up the patient's Home Health Physical Therapy prior to being released. Also, the skilled facility will be responsible for providing the patient with their medications at time of release from the facility to include their pain medication, the muscle relaxants, and their blood thinner medication. If the patient is still at the rehab facility at time of the two week follow up appointment, the skilled rehab facility will also need to assist the patient in arranging follow up appointment in our office and any transportation needs.  MAKE SURE YOU:  Understand these instructions.  Get help right away if you are not doing well or get worse.   DENTAL ANTIBIOTICS:  In most cases prophylactic antibiotics for Dental procdeures after total joint surgery are not  necessary.  Exceptions are as follows:  1. History of prior total joint infection  2. Severely immunocompromised (Organ Transplant, cancer chemotherapy, Rheumatoid biologic medications such as Humera)  3. Poorly controlled diabetes (A1C &gt; 8.0, blood glucose over 200)  If you have one of these conditions, contact your surgeon for an antibiotic prescription, prior to your dental procedure.    Pick up stool softner and laxative for home use following surgery while on pain medications. Do not submerge incision under water . Please use good hand washing techniques while changing dressing each day. May shower starting three days after surgery. Please use a clean towel to pat the incision dry following showers. Continue to use ice for pain and swelling after surgery. Do not use any lotions or creams on the incision until instructed by your surgeon.     Information on my medicine - ELIQUIS  (apixaban )  Why was Eliquis  prescribed for you? Eliquis  was prescribed for you to reduce the risk of blood clots forming after orthopedic surgery.    What do You need to know about Eliquis ? Take your Eliquis  TWICE DAILY - one tablet in the morning and one tablet in the evening with or without food.  It would be best to take the dose about the same time each day.  If you have difficulty swallowing the tablet whole please discuss with your pharmacist how to take the medication safely.  Take Eliquis  exactly as prescribed by your doctor and DO NOT stop taking Eliquis  without talking to the doctor who prescribed the medication.  Stopping without other medication to take the place of Eliquis  may increase your risk of developing a clot.  After discharge, you should have regular check-up appointments with your healthcare provider that is prescribing your Eliquis .  What do you do if you miss a dose? If a dose of ELIQUIS  is not taken at the scheduled time, take it as soon as possible on the same  day and twice-daily administration should be resumed.  The dose should not be doubled to make up for a missed dose.  Do not take more than one  tablet of ELIQUIS  at the same time.  Important Safety Information A possible side effect of Eliquis  is bleeding. You should call your healthcare provider right away if you experience any of the following: Bleeding from an injury or your nose that does not stop. Unusual colored urine (red or dark brown) or unusual colored stools (red or black). Unusual bruising for unknown reasons. A serious fall or if you hit your head (even if there is no bleeding).  Some medicines may interact with Eliquis  and might increase your risk of bleeding or clotting while on Eliquis . To help avoid this, consult your healthcare provider or pharmacist prior to using any new prescription or non-prescription medications, including herbals, vitamins, non-steroidal anti-inflammatory drugs (NSAIDs) and supplements.  This website has more information on Eliquis  (apixaban ): http://[email or web address] .com/eliquis Romaine Closs

## 2023-06-08 NOTE — Anesthesia Procedure Notes (Signed)
 Procedure Name: Intubation Date/Time: 06/08/2023 2:10 PM  Performed by: Elaina Graver, CRNAPre-anesthesia Checklist: Patient identified, Emergency Drugs available, Suction available and Patient being monitored Patient Re-evaluated:Patient Re-evaluated prior to induction Oxygen  Delivery Method: Circle System Utilized Preoxygenation: Pre-oxygenation with 100% oxygen  Induction Type: IV induction Ventilation: Mask ventilation without difficulty Laryngoscope Size: Mac and 4 Grade View: Grade III Tube type: Oral Tube size: 7.5 mm Number of attempts: 1 Airway Equipment and Method: Stylet Placement Confirmation: ETT inserted through vocal cords under direct vision, positive ETCO2 and breath sounds checked- equal and bilateral Secured at: 22 cm Tube secured with: Tape Dental Injury: Teeth and Oropharynx as per pre-operative assessment

## 2023-06-09 LAB — BASIC METABOLIC PANEL WITH GFR
Anion gap: 7 (ref 5–15)
BUN: 24 mg/dL — ABNORMAL HIGH (ref 8–23)
CO2: 21 mmol/L — ABNORMAL LOW (ref 22–32)
Calcium: 8.7 mg/dL — ABNORMAL LOW (ref 8.9–10.3)
Chloride: 106 mmol/L (ref 98–111)
Creatinine, Ser: 1.06 mg/dL (ref 0.61–1.24)
GFR, Estimated: >60 mL/min (ref >60)
Glucose, Bld: 187 mg/dL — ABNORMAL HIGH (ref 70–99)
Potassium: 4.3 mmol/L (ref 3.5–5.1)
Sodium: 134 mmol/L — ABNORMAL LOW (ref 135–145)

## 2023-06-09 LAB — CBC
HCT: 37.3 % — ABNORMAL LOW (ref 39.0–52.0)
Hemoglobin: 12.1 g/dL — ABNORMAL LOW (ref 13.0–17.0)
MCH: 31.4 pg (ref 26.0–34.0)
MCHC: 32.4 g/dL (ref 30.0–36.0)
MCV: 96.9 fL (ref 80.0–100.0)
Platelets: 84 10*3/uL — ABNORMAL LOW (ref 150–400)
RBC: 3.85 MIL/uL — ABNORMAL LOW (ref 4.22–5.81)
RDW: 16.9 % — ABNORMAL HIGH (ref 11.5–15.5)
WBC: 5.4 10*3/uL (ref 4.0–10.5)
nRBC: 0 % (ref 0.0–0.2)

## 2023-06-09 MED ORDER — METHOCARBAMOL 500 MG PO TABS: 500.0000 mg | ORAL_TABLET | Freq: Four times a day (QID) | ORAL | 0 refills | Status: DC | PRN

## 2023-06-09 MED ORDER — ONDANSETRON HCL 4 MG PO TABS: 4.0000 mg | ORAL_TABLET | Freq: Four times a day (QID) | ORAL | 0 refills | Status: DC | PRN

## 2023-06-09 MED ORDER — APIXABAN 2.5 MG PO TABS: 2.5000 mg | ORAL_TABLET | Freq: Two times a day (BID) | ORAL | 0 refills | Status: AC

## 2023-06-09 MED ORDER — TRAMADOL HCL 50 MG PO TABS
50.0000 mg | ORAL_TABLET | Freq: Four times a day (QID) | ORAL | 0 refills | Status: DC | PRN
Start: 2023-06-09 — End: 2023-12-14

## 2023-06-09 MED ORDER — OXYCODONE HCL 5 MG PO TABS
5.0000 mg | ORAL_TABLET | Freq: Four times a day (QID) | ORAL | 0 refills | Status: DC | PRN
Start: 2023-06-09 — End: 2023-12-14

## 2023-06-09 NOTE — TOC Initial Note (Signed)
 Transition of Care [facility]) - Initial/Assessment Note    Patient Details  Name: Logan Levine MRN: [id number] Date of Birth: 1950-12-01  [facility]) CM/SW Contact:    Delilah Fend, LCSW Phone Number: 06/09/2023, 2:43 PM  Clinical Narrative:                  Met with pt today to review dc planning needs.  Pt confirms he was admitted here from [facility]) where he has been a resident since Sept 2024.  He plans to return to SNF and resume therapy for his rehab.  Have confirmed with facility that patient will return when medically cleared.  Will continue to follow.   Expected Discharge Plan: Skilled Nursing Facility Barriers to Discharge: Continued Medical Work up   Patient Goals and CMS Choice Patient states their goals for this hospitalization and ongoing recovery are:: return to [facility] for therapy          Expected Discharge Plan and Services In-house Referral: Clinical Social Work   Post Acute Care Choice: Skilled Nursing Facility Living arrangements for the past 2 months: Skilled Nursing Facility                 DME Arranged: N/A DME Agency: NA                  Prior Living Arrangements/Services Living arrangements for the past 2 months: Skilled Nursing Facility Lives with:: Facility Resident Patient language and need for interpreter reviewed:: Yes Do you feel safe going back to the place where you live?: Yes      Need for Family Participation in Patient Care: No (Comment) Care giver support system in place?: Yes (comment)   Criminal Activity/Legal Involvement Pertinent to Current Situation/Hospitalization: No - Comment as needed  Activities of Daily Living   ADL Screening (condition at time of admission) Independently performs ADLs?: No (needs assistance getting up and down.) Does the patient have a NEW difficulty with bathing/dressing/toileting/self-feeding that is expected to last >3 days?: No Does the patient have a NEW difficulty  with getting in/out of bed, walking, or climbing stairs that is expected to last >3 days?: No Does the patient have a NEW difficulty with communication that is expected to last >3 days?: No Is the patient deaf or have difficulty hearing?: Yes Does the patient have difficulty seeing, even when wearing glasses/contacts?: No Does the patient have difficulty concentrating, remembering, or making decisions?: No  Permission Sought/Granted Permission sought to share information with : Photographer granted to share info w AGENCY: [facility]        Emotional Assessment Appearance:: Appears stated age Attitude/Demeanor/Rapport: Gracious Affect (typically observed): Accepting Orientation: : Oriented to Self, Oriented to Place, Oriented to  Time, Oriented to Situation Alcohol / Substance Use: Not Applicable Psych Involvement: No (comment)  Admission diagnosis:  History of removal of joint prosthesis of hip due to infection [Z98.890, Z86.19] Patient Active Problem List   Diagnosis Date Noted   History of removal of joint prosthesis of hip due to infection 06/08/2023   Upper GI bleed 10/17/2022   Gastrointestinal hemorrhage with hematemesis 10/16/2022   Dementia without behavioral disturbance (HCC) 10/16/2022    Class: Chronic   Small bowel obstruction (HCC) 10/16/2022    Class: Acute   Acute esophagitis 10/16/2022   Secondary esophageal varices without bleeding (HCC) 10/16/2022   Infection of prosthetic joint (HCC) 10/08/2022   Traumatic  hematoma of right knee 10/19/2017   Septic arthritis of knee, left (HCC) 05/04/2017   Hepatic cirrhosis due to chronic hepatitis C infection (HCC) 10/29/2016   Constipation 10/29/2016   History of revision of total knee arthroplasty 11/12/2009   KNEE PAIN, ACUTE 11/01/2008   PYELONEPHRITIS, ACUTE 05/28/2007   HERPES, GENITAL NEC 11/19/2006   GOUT NOS 11/19/2006   SMOKER 11/19/2006   HYPERTENSION, BENIGN ESSENTIAL  11/19/2006   BRONCHITIS, CHRONIC NEC 11/19/2006   Umbilical hernia 11/19/2006   HIV DISEASE 07/03/2006   HEPATITIS C 07/03/2006   SYPHILIS, EARLY, SYMPTOMATIC, PRIMARY NEC 07/03/2006   Osteoarthritis 07/03/2006   PCP:  Veda Gerald, MD Pharmacy:   [facility] - [place], [place] - [address] [place] [address] Phone: [phone] Fax: [phone]  [facility] - [address] [place], [place] - [address] [place] [address] Phone: [phone] Fax: [phone]  [facility] - [place], [place] - [address] [place] [address] Phone: [phone] Fax: [phone]     Social Drivers of Health (SDOH) Social History: SDOH Screenings   Food Insecurity: No Food Insecurity (06/08/2023)  Housing: Low Risk  (06/08/2023)  Transportation Needs: No Transportation Needs (06/08/2023)  Utilities: Not At Risk (06/08/2023)  Depression (PHQ2-9): Low Risk  (03/11/2023)  Financial Resource Strain: Low Risk  (06/09/2020)   [facility]  Social Connections: Moderately Isolated (06/08/2023)  Tobacco Use: Medium Risk (06/08/2023)   SDOH Interventions:     Readmission Risk Interventions    06/09/2023    2:34 PM  Readmission Risk Prevention Plan  Transportation Screening Complete  PCP or Specialist Appt within 5-7 Days Complete  Home Care Screening Complete  Medication Review (RN CM) Complete

## 2023-06-09 NOTE — [place] FL2 (Signed)
 [place]  MEDICAID FL2 LEVEL OF CARE FORM     IDENTIFICATION  Patient Name: Logan Levine Birthdate: 12-11-1950 Sex: male Admission Date (Current Location): 06/08/2023  [place] and [place] Number:  Ernesto Heady [id number] R Facility and Address:  [facility],  [address], [place] [address]      Provider Number: [id number]  Attending Physician Name and Address:  Liliane Rei, MD  Relative Name and Phone Number:  friend, Alethia Huxley @ [phone]    Current Level of Care: Hospital Recommended Level of Care: Skilled Nursing Facility Prior Approval Number:    Date Approved/Denied:   PASRR Number: [id number]  Discharge Plan: SNF    Current Diagnoses: Patient Active Problem List   Diagnosis Date Noted   History of removal of joint prosthesis of hip due to infection 06/08/2023   Upper GI bleed 10/17/2022   Gastrointestinal hemorrhage with hematemesis 10/16/2022   Dementia without behavioral disturbance (HCC) 10/16/2022   Small bowel obstruction (HCC) 10/16/2022   Acute esophagitis 10/16/2022   Secondary esophageal varices without bleeding (HCC) 10/16/2022   Infection of prosthetic joint (HCC) 10/08/2022   Traumatic hematoma of right knee 10/19/2017   Septic arthritis of knee, left (HCC) 05/04/2017   Hepatic cirrhosis due to chronic hepatitis C infection (HCC) 10/29/2016   Constipation 10/29/2016   History of revision of total knee arthroplasty 11/12/2009   KNEE PAIN, ACUTE 11/01/2008   PYELONEPHRITIS, ACUTE 05/28/2007   HERPES, GENITAL NEC 11/19/2006   GOUT NOS 11/19/2006   SMOKER 11/19/2006   HYPERTENSION, BENIGN ESSENTIAL 11/19/2006   BRONCHITIS, CHRONIC NEC 11/19/2006   Umbilical hernia 11/19/2006   HIV DISEASE 07/03/2006   HEPATITIS C 07/03/2006   SYPHILIS, EARLY, SYMPTOMATIC, PRIMARY NEC 07/03/2006   Osteoarthritis 07/03/2006    Orientation RESPIRATION BLADDER Height & Weight     Self, Time, Situation, Place  Normal Continent Weight:  202 lb (91.6 kg) Height:  5\' 8"  (172.7 cm)  BEHAVIORAL SYMPTOMS/MOOD NEUROLOGICAL BOWEL NUTRITION STATUS      Continent Diet (regular)  AMBULATORY STATUS COMMUNICATION OF NEEDS Skin   Limited Assist Verbally Other (Comment) (surgical incision only)                       Personal Care Assistance Level of Assistance  Bathing, Dressing Bathing Assistance: Limited assistance   Dressing Assistance: Limited assistance     Functional Limitations Info  Sight, Hearing, Speech Sight Info: Adequate Hearing Info: Adequate Speech Info: Adequate    SPECIAL CARE FACTORS FREQUENCY  PT (By licensed PT), OT (By licensed OT)     PT Frequency: 5x/wk OT Frequency: 5x/wk            Contractures Contractures Info: Not present    Additional Factors Info  Code Status, Allergies Code Status Info: Full Allergies Info: Morphine , Nsaids           Current Medications (06/09/2023):  This is the current hospital active medication list Current Facility-Administered Medications  Medication Dose Route Frequency Provider Last Rate Last Admin   0.9 %  sodium chloride  infusion   Intravenous Continuous Perla Bradford, PA 20 mL/hr at 06/09/23 0631 Rate Change at 06/09/23 0631   acetaminophen  (TYLENOL ) tablet 325-650 mg  325-650 mg Oral Q6H PRN Perla Bradford, PA       albuterol  (PROVENTIL ) (2.5 MG/3ML) 0.083% nebulizer solution 2.5 mg  2.5 mg Nebulization Q6H PRN Aluisio, Samuel Crock, MD       apixaban  (ELIQUIS ) tablet 2.5 mg  2.5 mg Oral Q12H [place],  Ivette Marks, PA   2.5 mg at 06/09/23 0802   bictegravir-emtricitabine -tenofovir  AF (BIKTARVY) 50-200-25 MG per tablet 1 tablet  1 tablet Oral Daily Perla Bradford, PA   1 tablet at 06/09/23 1610   bisacodyl  (DULCOLAX) suppository 10 mg  10 mg Rectal Daily PRN Perla Bradford, PA       budesonide  (PULMICORT ) nebulizer solution 0.25 mg  0.25 mg Nebulization BID Aluisio, Frank, MD   0.25 mg at 06/09/23 0739   carvedilol  (COREG ) tablet 6.25 mg  6.25 mg  Oral BID WC Perla Bradford, PA   6.25 mg at 06/08/23 2108   diphenhydrAMINE  (BENADRYL ) 12.5 MG/5ML elixir 12.5-25 mg  12.5-25 mg Oral Q4H PRN Perla Bradford, PA       docusate sodium  (COLACE) capsule 100 mg  100 mg Oral BID Perla Bradford, PA   100 mg at 06/09/23 0802   HYDROmorphone  (DILAUDID ) injection 0.5-1 mg  0.5-1 mg Intravenous Q2H PRN Perla Bradford, PA       menthol -cetylpyridinium (CEPACOL) lozenge 3 mg  1 lozenge Oral PRN Perla Bradford, PA       Or   phenol (CHLORASEPTIC) mouth spray 1 spray  1 spray Mouth/Throat PRN Swanburg, Rebecca, PA       methocarbamol  (ROBAXIN ) tablet 500 mg  500 mg Oral Q6H PRN Perla Bradford, PA       Or   methocarbamol  (ROBAXIN ) injection 500 mg  500 mg Intravenous Q6H PRN Perla Bradford, PA       metoCLOPramide  (REGLAN ) tablet 5-10 mg  5-10 mg Oral Q8H PRN Perla Bradford, PA       Or   metoCLOPramide  (REGLAN ) injection 5-10 mg  5-10 mg Intravenous Q8H PRN Perla Bradford, PA       ondansetron  (ZOFRAN ) tablet 4 mg  4 mg Oral Q6H PRN Perla Bradford, PA       Or   ondansetron  (ZOFRAN ) injection 4 mg  4 mg Intravenous Q6H PRN Perla Bradford, PA       oxyCODONE  (Oxy IR/ROXICODONE ) immediate release tablet 5-10 mg  5-10 mg Oral Q4H PRN Perla Bradford, PA   10 mg at 06/09/23 9604   pantoprazole  (PROTONIX ) EC tablet 40 mg  40 mg Oral QAC breakfast Perla Bradford, PA   40 mg at 06/09/23 0802   polyethylene glycol (MIRALAX  / GLYCOLAX ) packet 17 g  17 g Oral Daily PRN Perla Bradford, PA       sodium phosphate  (FLEET) enema 1 enema  1 enema Rectal Once PRN Perla Bradford, PA       sulfamethoxazole -trimethoprim  (BACTRIM ) 400-80 MG per tablet 1 tablet  1 tablet Oral Daily Perla Bradford, PA   1 tablet at 06/09/23 5409   tamsulosin  (FLOMAX ) capsule 0.4 mg  0.4 mg Oral QPM Perla Bradford, PA   0.4 mg at 06/08/23 2108   traMADol  (ULTRAM ) tablet 50-100 mg  50-100 mg Oral Q6H PRN Perla Bradford, PA       traZODone   (DESYREL ) tablet 100 mg  100 mg Oral QHS Perla Bradford, PA   100 mg at 06/08/23 2112   valACYclovir  (VALTREX ) tablet 500 mg  500 mg Oral QPM Perla Bradford, PA   500 mg at 06/08/23 2108     Discharge Medications: Please see discharge summary for a list of discharge medications.  Relevant Imaging Results:  Relevant Lab Results:   Additional Information SSN: [id number]  Delilah Fend, LCSW
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
## 2023-06-09 NOTE — Evaluation (Signed)
 Physical Therapy Evaluation Patient Details Name: Logan Levine MRN: [id number] DOB: 07-04-1950 Today's Date: 06/09/2023  History of Present Illness  PARAM HURD is a 73 y.o. male with a history of cirrhosis with hep C/HIV coinfection, COPD, GERD, HTN, HLD, and recent left TKA with antibiotics and spacer placement with revision 06/08/23.  Clinical Impression  Pt admitted with above diagnosis. Pt abx spacer removed 06/08/23 in L knee. Pt in recliner when PT arrives and requires CGA for standing with increased time to transfer due to pain in L knee. Once standing, pt lacks terminal knee extension on RLE, maintains 0-40 degrees on LLE throughout. Pt unable to advance mobility due to instability and weakness combination. Pt currently with functional limitations due to the deficits listed below (see PT Problem List). Pt will benefit from acute skilled PT to increase their independence and safety with mobility to allow discharge.           If plan is discharge home, recommend the following: A lot of help with walking and/or transfers;A lot of help with bathing/dressing/bathroom;Assistance with cooking/housework;Assist for transportation;Help with stairs or ramp for entrance   Can travel by private vehicle   No    Equipment Recommendations None recommended by PT  Recommendations for Other Services       Functional Status Assessment Patient has had a recent decline in their functional status and demonstrates the ability to make significant improvements in function in a reasonable and predictable amount of time.     Precautions / Restrictions Precautions Precautions: Fall Recall of Precautions/Restrictions: Intact Restrictions Weight Bearing Restrictions Per Provider Order: Yes LLE Weight Bearing Per Provider Order: Weight bearing as tolerated Other Position/Activity Restrictions: 0-40 degrees L knee ROM      Mobility  Bed Mobility               General bed mobility comments:  pt in recliner when PT arrives, wishes to remain in recliner at this time Patient Response: Anxious, Cooperative  Transfers Overall transfer level: Needs assistance Equipment used: Rolling walker (2 wheels) Transfers: Sit to/from Stand Sit to Stand: Contact guard assist           General transfer comment: significant time to come to stand and decreased tolerance in standing, R knee instability also present    Ambulation/Gait                  Stairs            Wheelchair Mobility     Tilt Bed Tilt Bed Patient Response: Anxious, Cooperative  Modified Rankin (Stroke Patients Only)       Balance Overall balance assessment: Needs assistance Sitting-balance support: Feet supported Sitting balance-Leahy Scale: Good     Standing balance support: Bilateral upper extremity supported, Reliant on assistive device for balance Standing balance-Leahy Scale: Poor                               Pertinent Vitals/Pain Pain Assessment Pain Assessment: Faces Faces Pain Scale: Hurts little more (no pain at rest, pain with mobility) Pain Location: L knee Pain Descriptors / Indicators: Aching, Operative site guarding, Sharp Pain Intervention(s): Limited activity within patient's tolerance, Monitored during session    Home Living Family/patient expects to be discharged to:: Skilled nursing facility                        Prior Function Prior  Level of Function : Independent/Modified Independent                     Extremity/Trunk Assessment        Lower Extremity Assessment Lower Extremity Assessment: Generalized weakness    Cervical / Trunk Assessment Cervical / Trunk Assessment: Kyphotic  Communication   Communication Communication: No apparent difficulties    Cognition Arousal: Alert Behavior During Therapy: WFL for tasks assessed/performed   PT - Cognitive impairments: No apparent impairments                          Following commands: Intact       Cueing Cueing Techniques: Verbal cues, Gestural cues, Visual cues     General Comments General comments (skin integrity, edema, etc.): Pt lacks terminal knee ext on RLE in standing, able to complete unloaded in recliner    Exercises     Assessment/Plan    PT Assessment Patient needs continued PT services  PT Problem List Decreased strength;Decreased activity tolerance;Decreased mobility;Decreased range of motion;Decreased balance       PT Treatment Interventions DME instruction;Functional mobility training;Balance training;Patient/family education;Gait training;Therapeutic activities;Therapeutic exercise    PT Goals (Current goals can be found in the Care Plan section)  Acute Rehab PT Goals Patient Stated Goal: improve tolerance to standing and walking PT Goal Formulation: With patient Time For Goal Achievement: 06/23/23 Potential to Achieve Goals: Good    Frequency Min 4X/week     Co-evaluation               AM-PAC PT "6 Clicks" Mobility  Outcome Measure Help needed turning from your back to your side while in a flat bed without using bedrails?: A Little Help needed moving from lying on your back to sitting on the side of a flat bed without using bedrails?: A Little Help needed moving to and from a bed to a chair (including a wheelchair)?: A Little Help needed standing up from a chair using your arms (e.g., wheelchair or bedside chair)?: A Little Help needed to walk in hospital room?: Total Help needed climbing 3-5 steps with a railing? : Total 6 Click Score: 14    End of Session Equipment Utilized During Treatment: Gait belt Activity Tolerance: Patient limited by pain Patient left: in chair;with call bell/phone within reach;with family/visitor present Nurse Communication: Mobility status PT Visit Diagnosis: Difficulty in walking, not elsewhere classified (R26.2);Muscle weakness (generalized) (M62.81)    Time:  1205-1228 PT Time Calculation (min) (ACUTE ONLY): 23 min   Charges:   PT Evaluation $PT Eval Low Complexity: 1 Low PT Treatments $Therapeutic Activity: 8-22 mins PT General Charges $$ ACUTE PT VISIT: 1 Visit         Darien Eden, PT Acute Rehabilitation Services Office: [phone] 06/09/2023   Serafin Dames 06/09/2023, 2:15 PM

## 2023-06-09 NOTE — Op Note (Signed)
 NAME: REDDEN, KILPELA MEDICAL RECORD NO: [id number] ACCOUNT NO: [id number] DATE OF BIRTH: September 23, 1950 FACILITY: [facility] LOCATION: [id number] PHYSICIAN: Henri Loft. Voshon Petro, MD  Operative Report   DATE OF PROCEDURE: 06/08/2023  PREOPERATIVE DIAGNOSIS:  Status post resection arthroplasty of the left knee.  POSTOPERATIVE DIAGNOSIS:  Status post resection arthroplasty of the left knee.  PROCEDURE PERFORMED:  Reimplantation of left total knee arthroplasty.  SURGEON:  Henri Loft. Zaveon Gillen, MD  ASSISTANT:  Brinton Canavan, PA-C  ANESTHESIA:  General and adductor canal block.  ESTIMATED BLOOD LOSS:  300.  DRAINS:  Hemovac x1 in the subcutaneous space.  COMPLICATIONS:  None.  CONDITION:  Stable to recovery.  TOURNIQUET TIME:  116 minutes at 300 mmHg.  BRIEF CLINICAL NOTE:  The patient is a 73 year old male with a complex history in regards to his left knee.  He is immunocompromised and had a periprosthetic joint infection, treated with resection arthroplasty in 11/2022.  It took a long time for his  incision to heal and as of 2 months ago, everything was healed well and infection workup including labs and aspiration was negative.  He presents now for reimplantation of the total knee arthroplasty.  DESCRIPTION OF PROCEDURE:  After successful administration of adductor canal block and general anesthetic, a tourniquet was placed on his left thigh and left lower extremity prepped and draped in the usual sterile fashion.  Extremity was wrapped in  Esmarch and tourniquet inflated to 300 mmHg.  Midline incision was made with a 10 blade through the subcutaneous tissue to the extensor mechanism.  There was minimal fluid in the joint and a specimen was sent for Gram stain, culture and sensitivity with  aerobic and anaerobic cultures.  The tissue and fluid appeared normal, but fluid was sent for analysis.  Soft tissue over the proximal medial tibia was then subperiosteally elevated to the joint line with the knife  and into the semimembranosus bursa with  a Cobb elevator.  Soft tissue laterally was elevated with attention being paid to avoiding the patellar tendon on the tibial tubercle.  There was an abundant amount of scar present under the extensor mechanism and I carefully dissected the scar off the  bone and then off the undersurface of the extensor mechanism.  When I did that, the knee mobilized much better.  I decided to do a quadriceps snip in order to mobilize the patella enough to flex the knee beyond 90 degrees.  A snip was not performed until  after I had removed the cement spacer.  I removed the cement spacer by breaking it up piecemeal using osteotomes and a mallet.  Once it was all removed, then I thoroughly irrigated the knee. I was then able to flex the knee approximately 90 degrees.  I gained access to the femoral and tibial  canals and irrigated both with saline.  We then reamed on the femoral canal up to 18 mm for an 18 x 75 stem and on the tibial side up to 20 mm for a 20 x 60 stem.  I addressed the femur first as it was easier access.  I decided to do a Noiles hinged knee  as his bone loss was proximal enough where I felt like it compromised the collateral ligaments.  I reamed up to 18 for an 18 x 75 stem extension and then did the broaching with the broaching up to a 34.  This was the second size broach.  This had great  torsional and axial stability.  We  then placed the cutting guide for the Noiles hinged knee off of the broach. The posterior chamfer cut and anterior cuts were made. I was only able to achieve any bony resection posteriorly on the chamfer.  We then made  the intercondylar cut.  I then addressed the tibia.  Circumferential retraction was placed around the tibia and the tibia subluxed forward.  There was a huge contained defect proximally, which would be best filled with a large sleeve.  I had reamed the canal up to 20 mm and  then we were preparing with the different sized  broaches.  I got all the way up to a size 61 which is the second largest and that had a great fit proximally in the tibial canal with excellent torsional and rotational stability.  The 6 is the most  appropriate size for the femoral base plate for the Attune revision tibia.  We placed a trial there and then on the femoral side did a trial with the small sized Noiles hinged knee with the 35 sleeve and the 18 x 75 stem extension.  Trials were impacted  and then with a 12-mm insert, we had full extension with excellent stability throughout.  All trials were removed and the cut surfaces were irrigated with pulsatile lavage.  Two batches of gentamicin-impregnated cement were mixed and once ready for implantation, the tibial component is cemented just at the baseplate as the sleeve is porous  coated and the stem is press fit. On the femoral side, similarly, we just cemented distally as the sleeve was porous coated and the stem press fit.  Components were impacted and are extremely stable.  The trial 12 mm insert was placed.  The knee held in  full extension and once the cement was fully hardened, then the permanent 12 mm rotating hinge insert was placed into the tibial tray.  I then placed a locking pin through the hinge and locked it into position.  The patella tracked normally with flexion  down to about 90 degrees.  We thoroughly irrigated the joint with 3 liters of saline using pulsatile lavage.  The quadriceps snip was closed with interrupted #1 PDS suture.  The arthrotomy was then closed with a running 0 Stratafix suture.  I did not  want to flex this because the tissues were so tight, but I was easily able to get him to 60 degrees of flexion, but did not push beyond that.  My fear was disrupting the extensor mechanism due to how stiff he was preoperatively.  The tourniquet was then  released for a total time of 116 minutes.  Please note that while the tourniquet was still up, I injected the extensor  mechanism, the periosteum of the femur and posterior capsule with 20 mL of Exparel  mixed with 60 mL of saline.  Once the tourniquet was  released, then a drain was placed in the subcutaneous space and subcutaneous closed with interrupted 2-0 Vicryl and skin with staples.  Incision was cleaned and dried and a bulky sterile dressing applied.  He was then awakened and transported to  recovery in stable condition.  Note that a surgical assistant's medical necessity for this procedure to do it in a safe and expeditious manner.  Surgical assistant is necessary for retraction of vital ligaments and neurovascular structures and for proper positioning of the limb, for  safe removal of the spacer and safe and accurate placement of the new implant.   SHW D: 06/08/2023 4:47:37 pm T: 06/09/2023  5:39:00 am  JOB: [id number]/ [id number]

## 2023-06-10 ENCOUNTER — Encounter (HOSPITAL_COMMUNITY): Payer: Self-pay | Admitting: Orthopedic Surgery

## 2023-06-10 DIAGNOSIS — F0390 Unspecified dementia without behavioral disturbance: Secondary | ICD-10-CM | POA: Diagnosis present

## 2023-06-10 DIAGNOSIS — Z21 Asymptomatic human immunodeficiency virus [HIV] infection status: Secondary | ICD-10-CM | POA: Diagnosis not present

## 2023-06-10 DIAGNOSIS — G4700 Insomnia, unspecified: Secondary | ICD-10-CM | POA: Diagnosis present

## 2023-06-10 DIAGNOSIS — J449 Chronic obstructive pulmonary disease, unspecified: Secondary | ICD-10-CM | POA: Diagnosis present

## 2023-06-10 DIAGNOSIS — B20 Human immunodeficiency virus [HIV] disease: Secondary | ICD-10-CM | POA: Diagnosis present

## 2023-06-10 DIAGNOSIS — M009 Pyogenic arthritis, unspecified: Secondary | ICD-10-CM | POA: Diagnosis present

## 2023-06-10 DIAGNOSIS — D8481 Immunodeficiency due to conditions classified elsewhere: Secondary | ICD-10-CM | POA: Diagnosis present

## 2023-06-10 DIAGNOSIS — N401 Enlarged prostate with lower urinary tract symptoms: Secondary | ICD-10-CM | POA: Diagnosis present

## 2023-06-10 DIAGNOSIS — M1732 Unilateral post-traumatic osteoarthritis, left knee: Secondary | ICD-10-CM | POA: Diagnosis not present

## 2023-06-10 DIAGNOSIS — M6281 Muscle weakness (generalized): Secondary | ICD-10-CM | POA: Diagnosis not present

## 2023-06-10 DIAGNOSIS — R5381 Other malaise: Secondary | ICD-10-CM | POA: Diagnosis not present

## 2023-06-10 DIAGNOSIS — Z5189 Encounter for other specified aftercare: Secondary | ICD-10-CM | POA: Diagnosis not present

## 2023-06-10 DIAGNOSIS — Z96659 Presence of unspecified artificial knee joint: Secondary | ICD-10-CM | POA: Diagnosis present

## 2023-06-10 DIAGNOSIS — J4489 Other specified chronic obstructive pulmonary disease: Secondary | ICD-10-CM | POA: Diagnosis not present

## 2023-06-10 DIAGNOSIS — I8500 Esophageal varices without bleeding: Secondary | ICD-10-CM | POA: Diagnosis present

## 2023-06-10 DIAGNOSIS — F5101 Primary insomnia: Secondary | ICD-10-CM | POA: Diagnosis not present

## 2023-06-10 DIAGNOSIS — K5900 Constipation, unspecified: Secondary | ICD-10-CM | POA: Diagnosis present

## 2023-06-10 DIAGNOSIS — M1711 Unilateral primary osteoarthritis, right knee: Secondary | ICD-10-CM | POA: Diagnosis not present

## 2023-06-10 DIAGNOSIS — J99 Respiratory disorders in diseases classified elsewhere: Secondary | ICD-10-CM | POA: Diagnosis present

## 2023-06-10 DIAGNOSIS — I10 Essential (primary) hypertension: Secondary | ICD-10-CM | POA: Diagnosis present

## 2023-06-10 DIAGNOSIS — Z7401 Bed confinement status: Secondary | ICD-10-CM | POA: Diagnosis not present

## 2023-06-10 DIAGNOSIS — Z4789 Encounter for other orthopedic aftercare: Secondary | ICD-10-CM | POA: Diagnosis not present

## 2023-06-10 DIAGNOSIS — Z96652 Presence of left artificial knee joint: Secondary | ICD-10-CM | POA: Diagnosis present

## 2023-06-10 DIAGNOSIS — E785 Hyperlipidemia, unspecified: Secondary | ICD-10-CM | POA: Diagnosis present

## 2023-06-10 DIAGNOSIS — L7682 Other postprocedural complications of skin and subcutaneous tissue: Secondary | ICD-10-CM | POA: Diagnosis present

## 2023-06-10 DIAGNOSIS — D649 Anemia, unspecified: Secondary | ICD-10-CM | POA: Diagnosis present

## 2023-06-10 DIAGNOSIS — E441 Mild protein-calorie malnutrition: Secondary | ICD-10-CM | POA: Diagnosis present

## 2023-06-10 DIAGNOSIS — K7469 Other cirrhosis of liver: Secondary | ICD-10-CM | POA: Diagnosis present

## 2023-06-10 DIAGNOSIS — A6002 Herpesviral infection of other male genital organs: Secondary | ICD-10-CM | POA: Diagnosis present

## 2023-06-10 DIAGNOSIS — B182 Chronic viral hepatitis C: Secondary | ICD-10-CM | POA: Diagnosis present

## 2023-06-10 DIAGNOSIS — R52 Pain, unspecified: Secondary | ICD-10-CM | POA: Diagnosis not present

## 2023-06-10 DIAGNOSIS — M25562 Pain in left knee: Secondary | ICD-10-CM | POA: Diagnosis not present

## 2023-06-10 DIAGNOSIS — M1990 Unspecified osteoarthritis, unspecified site: Secondary | ICD-10-CM | POA: Diagnosis present

## 2023-06-10 DIAGNOSIS — A539 Syphilis, unspecified: Secondary | ICD-10-CM | POA: Diagnosis present

## 2023-06-10 DIAGNOSIS — S8780XA Crushing injury of unspecified lower leg, initial encounter: Secondary | ICD-10-CM | POA: Diagnosis not present

## 2023-06-10 LAB — CBC
HCT: 34.9 % — ABNORMAL LOW (ref 39.0–52.0)
Hemoglobin: 11.7 g/dL — ABNORMAL LOW (ref 13.0–17.0)
MCH: 31.3 pg (ref 26.0–34.0)
MCHC: 33.5 g/dL (ref 30.0–36.0)
MCV: 93.3 fL (ref 80.0–100.0)
Platelets: 86 10*3/uL — ABNORMAL LOW (ref 150–400)
RBC: 3.74 MIL/uL — ABNORMAL LOW (ref 4.22–5.81)
RDW: 16.9 % — ABNORMAL HIGH (ref 11.5–15.5)
WBC: 9.8 10*3/uL (ref 4.0–10.5)
nRBC: 0 % (ref 0.0–0.2)

## 2023-06-10 NOTE — TOC Transition Note (Signed)
 Transition of Care [facility]) - Discharge Note   Patient Details  Name: Logan Levine MRN: [id number] Date of Birth: 12-29-1950  Transition of Care [facility]) CM/SW Contact:  Delilah Fend, LCSW Phone Number: 06/10/2023, 10:28 AM   Clinical Narrative:     Pt medically cleared for dc today and returning to [facility].  Pt and friend, Carliss Chess, aware and agreeable.  PTAR called at 10:25am.  RN to call report to [phone].  No further TOC needs.  Final next level of care: Skilled Nursing Facility Barriers to Discharge: Barriers Resolved   Patient Goals and CMS Choice Patient states their goals for this hospitalization and ongoing recovery are:: return to [facility] for therapy          Discharge Placement   Existing PASRR number confirmed : 06/09/23          Patient chooses bed at: Other - please specify in the comment section below: [facility]) Patient to be transferred to facility by: PTAR Name of family member notified: friend, Carliss Chess Patient and family notified of of transfer: 06/10/23  Discharge Plan and Services Additional resources added to the After Visit Summary for   In-house Referral: Clinical Social Work   Post Acute Care Choice: Skilled Nursing Facility          DME Arranged: N/A DME Agency: NA                  Social Drivers of Health (SDOH) Interventions SDOH Screenings   Food Insecurity: No Food Insecurity (06/08/2023)  Housing: Low Risk  (06/08/2023)  Transportation Needs: No Transportation Needs (06/08/2023)  Utilities: Not At Risk (06/08/2023)  Depression (PHQ2-9): Low Risk  (03/11/2023)  Financial Resource Strain: Low Risk  (06/09/2020)   Received from [facility], [facility]  Social Connections: Moderately Isolated (06/08/2023)  Tobacco Use: Medium Risk (06/08/2023)     Readmission Risk Interventions    06/09/2023    2:34 PM  Readmission Risk Prevention Plan  Transportation Screening Complete  PCP or Specialist Appt within  5-7 Days Complete  Home Care Screening Complete  Medication Review (RN CM) Complete

## 2023-06-10 NOTE — Progress Notes (Signed)
 Attempted to call report to [facility] facility at 1039. Was able to talk with Diplomatic Services operational officer. However, was unable to speak with receiving nurse because she did not answer transfer call.
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
## 2023-06-13 LAB — AEROBIC/ANAEROBIC CULTURE W GRAM STAIN (SURGICAL/DEEP WOUND)
Culture: NO GROWTH
Gram Stain: NONE SEEN

## 2023-06-15 DIAGNOSIS — R5381 Other malaise: Secondary | ICD-10-CM | POA: Diagnosis not present

## 2023-06-15 DIAGNOSIS — M1732 Unilateral post-traumatic osteoarthritis, left knee: Secondary | ICD-10-CM | POA: Diagnosis not present

## 2023-06-15 DIAGNOSIS — R52 Pain, unspecified: Secondary | ICD-10-CM | POA: Diagnosis not present

## 2023-06-17 DIAGNOSIS — F5101 Primary insomnia: Secondary | ICD-10-CM | POA: Diagnosis not present

## 2023-06-17 DIAGNOSIS — F0390 Unspecified dementia without behavioral disturbance: Secondary | ICD-10-CM | POA: Diagnosis not present

## 2023-06-24 ENCOUNTER — Other Ambulatory Visit: Payer: Self-pay | Admitting: *Deleted

## 2023-06-24 NOTE — Patient Outreach (Addendum)
 Post- Acute Care Manager follow up. Per [name]. He recently admitted under [name].  Collaboration with [name] Admissions Director. Logan Levine plans to return home upon SNF discharge. Currently needs more therapy before returning home.   Will continue to follow.   Nolberto Batty, MSN, RN, BSN [place]  [facility] RN Post- Acute Care Manager Direct Dial: [phone]

## 2023-06-30 MED ORDER — ROPIVACAINE HCL 5 MG/ML IJ SOLN
INTRAMUSCULAR | Status: DC | PRN
  Administered 2023-06-08: 30 mL via PERINEURAL

## 2023-06-30 MED ORDER — CLONIDINE HCL (ANALGESIA) 100 MCG/ML EP SOLN
EPIDURAL | Status: DC | PRN
  Administered 2023-06-08: 100 ug

## 2023-06-30 NOTE — Anesthesia Procedure Notes (Signed)
 Anesthesia Regional Block: Adductor canal block   Pre-Anesthetic Checklist: , timeout performed,  Correct Patient, Correct Site, Correct Laterality,  Correct Procedure, Correct Position, site marked,  Risks and benefits discussed,  Surgical consent,  Pre-op evaluation,  At surgeon's request and post-op pain management  Laterality: Lower and Left  Prep: chloraprep       Needles:  Injection technique: Single-shot  Needle Type: Echogenic Needle     Needle Length: 9cm  Needle Gauge: 22     Additional Needles:   Procedures:,,,, ultrasound used (permanent image in chart),,    Narrative:  Start time: 06/08/2023 1:34 PM End time: 06/08/2023 1:40 PM Injection made incrementally with aspirations every 5 mL.  Performed by: Personally  Anesthesiologist: Rosalita Combe, MD  Additional Notes: Block assessed prior to surgery. Pt tolerated procedure well.

## 2023-06-30 NOTE — Addendum Note (Signed)
 Addendum  created 06/30/23 2200 by Rosalita Combe, MD   Child order released for a procedure order, Clinical Note Signed, Intraprocedure Blocks edited, Intraprocedure Meds edited, Order Canceled from Note, SmartForm saved

## 2023-07-14 DIAGNOSIS — B182 Chronic viral hepatitis C: Secondary | ICD-10-CM | POA: Diagnosis not present

## 2023-07-14 DIAGNOSIS — J4489 Other specified chronic obstructive pulmonary disease: Secondary | ICD-10-CM | POA: Diagnosis not present

## 2023-07-14 DIAGNOSIS — Z21 Asymptomatic human immunodeficiency virus [HIV] infection status: Secondary | ICD-10-CM | POA: Diagnosis not present

## 2023-07-14 DIAGNOSIS — M1732 Unilateral post-traumatic osteoarthritis, left knee: Secondary | ICD-10-CM | POA: Diagnosis not present

## 2023-07-14 DIAGNOSIS — R52 Pain, unspecified: Secondary | ICD-10-CM | POA: Diagnosis not present

## 2023-07-14 DIAGNOSIS — R5381 Other malaise: Secondary | ICD-10-CM | POA: Diagnosis not present

## 2023-07-14 DIAGNOSIS — M009 Pyogenic arthritis, unspecified: Secondary | ICD-10-CM | POA: Diagnosis not present

## 2023-07-17 DIAGNOSIS — M1711 Unilateral primary osteoarthritis, right knee: Secondary | ICD-10-CM | POA: Diagnosis not present

## 2023-07-17 DIAGNOSIS — Z5189 Encounter for other specified aftercare: Secondary | ICD-10-CM | POA: Diagnosis not present

## 2023-07-21 ENCOUNTER — Other Ambulatory Visit: Payer: Self-pay | Admitting: *Deleted

## 2023-07-21 NOTE — Patient Outreach (Signed)
 Post-Acute Care Manager follow up. Mr. Munter resides in [place]. [facility] SNF waiver was utilized for admission to SNF. Screening for potential complex care management needs as benefit of health plan and PCP.   Collaboration with [facility] SN[facility] social worker. Mr. Phillips will transition home likely this week per request. Home health agency not known yet.   Will plan to refer to [facility] CCM team upon SNF discharge. Will follow up with SNF social worker about home health arrangements.    Nolberto Batty, MSN, RN, BSN [place]  [facility], [facility] RN Post- Acute Care Manager Direct Dial: [phone]

## 2023-07-22 DIAGNOSIS — F5101 Primary insomnia: Secondary | ICD-10-CM | POA: Diagnosis not present

## 2023-07-22 DIAGNOSIS — F0390 Unspecified dementia without behavioral disturbance: Secondary | ICD-10-CM | POA: Diagnosis not present

## 2023-07-27 ENCOUNTER — Encounter: Payer: Self-pay | Admitting: Gastroenterology

## 2023-07-30 DIAGNOSIS — N400 Enlarged prostate without lower urinary tract symptoms: Secondary | ICD-10-CM | POA: Diagnosis not present

## 2023-07-30 DIAGNOSIS — K219 Gastro-esophageal reflux disease without esophagitis: Secondary | ICD-10-CM | POA: Diagnosis not present

## 2023-07-30 DIAGNOSIS — I10 Essential (primary) hypertension: Secondary | ICD-10-CM | POA: Diagnosis not present

## 2023-07-30 DIAGNOSIS — Z131 Encounter for screening for diabetes mellitus: Secondary | ICD-10-CM | POA: Diagnosis not present

## 2023-07-30 DIAGNOSIS — J449 Chronic obstructive pulmonary disease, unspecified: Secondary | ICD-10-CM | POA: Diagnosis not present

## 2023-07-30 DIAGNOSIS — D649 Anemia, unspecified: Secondary | ICD-10-CM | POA: Diagnosis not present

## 2023-07-30 DIAGNOSIS — B20 Human immunodeficiency virus [HIV] disease: Secondary | ICD-10-CM | POA: Diagnosis not present

## 2023-07-30 DIAGNOSIS — Z0000 Encounter for general adult medical examination without abnormal findings: Secondary | ICD-10-CM | POA: Diagnosis not present

## 2023-07-30 DIAGNOSIS — N1831 Chronic kidney disease, stage 3a: Secondary | ICD-10-CM | POA: Diagnosis not present

## 2023-07-30 DIAGNOSIS — Z1331 Encounter for screening for depression: Secondary | ICD-10-CM | POA: Diagnosis not present

## 2023-08-06 ENCOUNTER — Other Ambulatory Visit: Payer: Self-pay | Admitting: *Deleted

## 2023-08-06 DIAGNOSIS — I10 Essential (primary) hypertension: Secondary | ICD-10-CM

## 2023-08-06 NOTE — Patient Outreach (Signed)
 Post- Acute Care Manager follow up. [facility] SNF waiver was previously used for admission to [facility]. Verified in [facility] Logan Levine discharged from [facility] on 07/27/23. [facility] home health was arranged.   Will refer to [facility] CCM team for complex care management services.    [name], MSN, RN, BSN [place]  [facility], [facility] RN Post- Acute Care Manager Direct Dial: [phone]

## 2023-08-11 ENCOUNTER — Telehealth: Payer: Self-pay | Admitting: *Deleted

## 2023-08-11 NOTE — Progress Notes (Signed)
 Complex Care Management Note Care Guide Note  08/11/2023 Name: Logan Levine MRN: [id number] DOB: 05-26-1950   Complex Care Management Outreach Attempts: An unsuccessful telephone outreach was attempted today to offer the patient information about available complex care management services.  Follow Up Plan:  Additional outreach attempts will be made to offer the patient complex care management information and services.   Encounter Outcome:  No Answer  Harlene Satterfield  [facility]  [facility], [facility] Guide  Direct Dial: [phone]  Fax [phone]

## 2023-08-12 NOTE — Progress Notes (Signed)
 Complex Care Management Note Care Guide Note  08/12/2023 Name: LEE KUANG MRN: [id number] DOB: [birth year]   Complex Care Management Outreach Attempts: A second unsuccessful outreach was attempted today to offer the patient with information about available complex care management services.  Follow Up Plan:  Additional outreach attempts will be made to offer the patient complex care management information and services.   Encounter Outcome:  No Answer  Harlene Satterfield  [facility]  [facility], [facility] Guide  Direct Dial: [phone]  Fax [phone]

## 2023-08-13 ENCOUNTER — Other Ambulatory Visit: Payer: Self-pay | Admitting: *Deleted

## 2023-08-13 NOTE — Patient Outreach (Signed)
 Made aware by [facility] care guide that Mr. Barb' has been unable to reach. Writer attempted to reach Mr. Isabell by phone but number is disconnected.   Writer followed back up with [facility] who provided Mr. Bruna' friend David's number to call at [phone].   Telephone call made to Alm [phone]. Alm reports Mr. Rivadeneira has a new cell number [phone]. Alm states he is Mr. Erber' caregiver. Alm states he can be called if needed.   Writer will provide new cell number to [facility] CCM team.  [name], MSN, RN, BSN [place]  [facility], [facility] RN Post- Acute Care Manager Direct Dial: [phone]

## 2023-08-18 NOTE — Progress Notes (Signed)
 Complex Care Management Note Care Guide Note  08/18/2023 Name: HARMAN LANGHANS MRN: [id number] DOB: [birth year]   Complex Care Management Outreach Attempts: A third unsuccessful outreach was attempted today to offer the patient with information about available complex care management services.  Follow Up Plan:  No further outreach attempts will be made at this time. We have been unable to contact the patient to offer or enroll patient in complex care management services.  Encounter Outcome:  No Answer  Harlene Satterfield  [facility]  [facility], [facility] Guide  Direct Dial: [phone]  Fax [phone]

## 2023-08-26 ENCOUNTER — Ambulatory Visit: Admitting: Internal Medicine

## 2023-09-10 DIAGNOSIS — M1711 Unilateral primary osteoarthritis, right knee: Secondary | ICD-10-CM | POA: Diagnosis not present

## 2023-09-10 DIAGNOSIS — M25562 Pain in left knee: Secondary | ICD-10-CM | POA: Diagnosis not present

## 2023-09-15 DIAGNOSIS — M1711 Unilateral primary osteoarthritis, right knee: Secondary | ICD-10-CM | POA: Diagnosis not present

## 2023-09-15 DIAGNOSIS — M25562 Pain in left knee: Secondary | ICD-10-CM | POA: Diagnosis not present

## 2023-09-17 DIAGNOSIS — M25562 Pain in left knee: Secondary | ICD-10-CM | POA: Diagnosis not present

## 2023-09-17 DIAGNOSIS — M1711 Unilateral primary osteoarthritis, right knee: Secondary | ICD-10-CM | POA: Diagnosis not present

## 2023-09-22 DIAGNOSIS — M25562 Pain in left knee: Secondary | ICD-10-CM | POA: Diagnosis not present

## 2023-09-22 DIAGNOSIS — M1711 Unilateral primary osteoarthritis, right knee: Secondary | ICD-10-CM | POA: Diagnosis not present

## 2023-09-24 DIAGNOSIS — M1711 Unilateral primary osteoarthritis, right knee: Secondary | ICD-10-CM | POA: Diagnosis not present

## 2023-09-24 DIAGNOSIS — M25562 Pain in left knee: Secondary | ICD-10-CM | POA: Diagnosis not present

## 2023-10-01 DIAGNOSIS — Z5189 Encounter for other specified aftercare: Secondary | ICD-10-CM | POA: Diagnosis not present

## 2023-10-14 DIAGNOSIS — Z23 Encounter for immunization: Secondary | ICD-10-CM | POA: Diagnosis not present

## 2023-10-29 DIAGNOSIS — K219 Gastro-esophageal reflux disease without esophagitis: Secondary | ICD-10-CM | POA: Diagnosis not present

## 2023-10-29 DIAGNOSIS — I10 Essential (primary) hypertension: Secondary | ICD-10-CM | POA: Diagnosis not present

## 2023-10-29 DIAGNOSIS — N182 Chronic kidney disease, stage 2 (mild): Secondary | ICD-10-CM | POA: Diagnosis not present

## 2023-10-29 DIAGNOSIS — B20 Human immunodeficiency virus [HIV] disease: Secondary | ICD-10-CM | POA: Diagnosis not present

## 2023-10-29 DIAGNOSIS — J449 Chronic obstructive pulmonary disease, unspecified: Secondary | ICD-10-CM | POA: Diagnosis not present

## 2023-10-29 DIAGNOSIS — N400 Enlarged prostate without lower urinary tract symptoms: Secondary | ICD-10-CM | POA: Diagnosis not present

## 2023-11-03 DIAGNOSIS — I10 Essential (primary) hypertension: Secondary | ICD-10-CM | POA: Diagnosis not present

## 2023-11-03 DIAGNOSIS — N182 Chronic kidney disease, stage 2 (mild): Secondary | ICD-10-CM | POA: Diagnosis not present

## 2023-11-25 ENCOUNTER — Ambulatory Visit: Admitting: Internal Medicine

## 2023-12-01 DIAGNOSIS — I10 Essential (primary) hypertension: Secondary | ICD-10-CM | POA: Diagnosis not present

## 2023-12-01 DIAGNOSIS — N182 Chronic kidney disease, stage 2 (mild): Secondary | ICD-10-CM | POA: Diagnosis not present

## 2023-12-09 ENCOUNTER — Encounter (INDEPENDENT_AMBULATORY_CARE_PROVIDER_SITE_OTHER): Payer: Self-pay | Admitting: Gastroenterology

## 2023-12-14 ENCOUNTER — Other Ambulatory Visit: Payer: Self-pay

## 2023-12-14 ENCOUNTER — Ambulatory Visit (INDEPENDENT_AMBULATORY_CARE_PROVIDER_SITE_OTHER): Admitting: Internal Medicine

## 2023-12-14 ENCOUNTER — Other Ambulatory Visit (HOSPITAL_COMMUNITY): Payer: Self-pay

## 2023-12-14 ENCOUNTER — Encounter: Payer: Self-pay | Admitting: Internal Medicine

## 2023-12-14 ENCOUNTER — Other Ambulatory Visit: Payer: Self-pay | Admitting: Pharmacist

## 2023-12-14 VITALS — BP 142/87 | HR 76 | Temp 97.9°F

## 2023-12-14 DIAGNOSIS — I10 Essential (primary) hypertension: Secondary | ICD-10-CM

## 2023-12-14 DIAGNOSIS — Z79899 Other long term (current) drug therapy: Secondary | ICD-10-CM

## 2023-12-14 DIAGNOSIS — B20 Human immunodeficiency virus [HIV] disease: Secondary | ICD-10-CM

## 2023-12-14 DIAGNOSIS — Z8619 Personal history of other infectious and parasitic diseases: Secondary | ICD-10-CM

## 2023-12-14 MED ORDER — SULFAMETHOXAZOLE-TRIMETHOPRIM 400-80 MG PO TABS
1.0000 | ORAL_TABLET | Freq: Every day | ORAL | 6 refills | Status: AC
  Filled 2023-12-14: qty 30, 30d supply, fill #0

## 2023-12-14 MED ORDER — BICTEGRAVIR-EMTRICITAB-TENOFOV 50-200-25 MG PO TABS
1.0000 | ORAL_TABLET | Freq: Every day | ORAL | 11 refills | Status: AC
  Filled 2023-12-14: qty 30, 30d supply, fill #0
  Filled 2024-01-08: qty 30, 30d supply, fill #1
  Filled 2024-02-05: qty 30, 30d supply, fill #2
  Filled 2024-03-11: qty 30, 30d supply, fill #3

## 2023-12-14 NOTE — Progress Notes (Signed)
 Specialty Pharmacy Initial Fill Coordination Note  Logan Levine is a 73 y.o. male contacted today regarding initial fill of specialty medication(s) Bictegravir-Emtricitab-Tenofov (BIKTARVY )   Patient requested Delivery   Delivery date: 12/18/23   Verified address: [address] [name] [address]   Medication will be filled on: 12/17/23   Patient is aware of $0 copayment.

## 2023-12-14 NOTE — Progress Notes (Signed)
 Specialty Pharmacy Initiation Note   Logan Levine is a 73 y.o. male who will be followed by the specialty pharmacy service for RxSp HIV    Review of administration, indication, effectiveness, safety, potential side effects, storage/disposable, and missed dose instructions occurred today for patient's specialty medication(s) Bictegravir-Emtricitab-Tenofov (BIKTARVY )     Patient/Caregiver did not have any additional questions or concerns.   Patient's therapy is appropriate to: Continue    Goals Addressed             This Visit's Progress    Achieve Undetectable HIV Viral Load < 20       Patient is on track. Patient will maintain adherence      Comply with lab assessments       Patient is on track. Patient will adhere to provider and/or lab appointments      Increase CD4 count until steady state       Patient is not on track and improving. Patient will maintain adherence and work on increased adherence         Alan JINNY Geralds Specialty Pharmacist

## 2023-12-14 NOTE — Progress Notes (Signed)
 RFV: follow up for hiv disease  Patient ID: Logan Levine, male   DOB: 09/13/1950, 73 y.o.   MRN: [id number]  HPI Mr Nygaard is a 73yo M with hx of well controlled hiv disease, hx of left knee pji but has had left knee reimplantation without issues now. His Left knee well healed - but shorter now than his right leg. He does suffer from Right knee instability thus is still wheelchair dependent. Now released from nursing home, since June 2025.sees pcp every 3 months  Outpatient Encounter Medications as of 12/14/2023  Medication Sig   albuterol  (PROVENTIL  HFA;VENTOLIN  HFA) 108 (90 Base) MCG/ACT inhaler Inhale 2 puffs into the lungs every 6 (six) hours as needed for wheezing or shortness of breath.   amLODipine  (NORVASC ) 5 MG tablet Take 5 mg by mouth daily.   aspirin  EC 81 MG tablet Take 81 mg by mouth daily.   bictegravir-emtricitabine -tenofovir  AF (BIKTARVY ) 50-200-25 MG TABS tablet Take 1 tablet by mouth daily.   carvedilol  (COREG ) 6.25 MG tablet Take 1 tablet (6.25 mg total) by mouth 2 (two) times daily with a meal.   ferrous sulfate  325 (65 FE) MG EC tablet Take 325 mg by mouth 2 (two) times daily.   Fluticasone  Propionate, Inhal, (FLUTICASONE  PROPIONATE DISKUS) 100 MCG/ACT AEPB Inhale 1 puff into the lungs 2 (two) times daily.   losartan  (COZAAR ) 50 MG tablet Take 50 mg by mouth daily.   Magnesium  200 MG TABS Take by mouth.   Multiple Vitamins-Minerals (MULTIVITAMIN WITH MINERALS) tablet Take 1 tablet by mouth daily.   QVAR  REDIHALER 80 MCG/ACT inhaler Inhale 1 puff into the lungs 2 (two) times daily.   sulfamethoxazole -trimethoprim  (BACTRIM ) 400-80 MG tablet Take 1 tablet by mouth daily.   tamsulosin  (FLOMAX ) 0.4 MG CAPS capsule Take 0.4 mg by mouth every evening.   valACYclovir  (VALTREX ) 500 MG tablet Take 500 mg by mouth every evening.   [DISCONTINUED] ascorbic acid (VITAMIN C) 500 MG tablet Take 500 mg by mouth 2 (two) times daily. (Patient not taking: Reported on 12/14/2023)    [DISCONTINUED] dextromethorphan-guaiFENesin (GERI-TUSSIN DM) 10-100 MG/5ML liquid Take 10 mLs by mouth every 6 (six) hours as needed for cough. (Patient not taking: Reported on 12/14/2023)   [DISCONTINUED] methocarbamol  (ROBAXIN ) 500 MG tablet Take 1 tablet (500 mg total) by mouth every 6 (six) hours as needed for muscle spasms. (Patient not taking: Reported on 12/14/2023)   [DISCONTINUED] ondansetron  (ZOFRAN ) 4 MG tablet Take 1 tablet (4 mg total) by mouth every 6 (six) hours as needed for nausea. (Patient not taking: Reported on 12/14/2023)   [DISCONTINUED] oxyCODONE  (OXY IR/ROXICODONE ) 5 MG immediate release tablet Take 1-2 tablets (5-10 mg total) by mouth every 6 (six) hours as needed for severe pain (pain score 7-10). (Patient not taking: Reported on 12/14/2023)   [DISCONTINUED] pantoprazole  (PROTONIX ) 40 MG tablet Take 1 tablet (40 mg total) by mouth daily before breakfast. (Patient not taking: Reported on 12/14/2023)   [DISCONTINUED] polyethylene glycol powder (GLYCOLAX /MIRALAX ) 17 GM/SCOOP powder Measure 17 g of powder, mix in 4 oz of water  or juice & drink by mouth daily as needed for mild constipation. (Patient not taking: Reported on 12/14/2023)   [DISCONTINUED] traMADol  (ULTRAM ) 50 MG tablet Take 1-2 tablets (50-100 mg total) by mouth every 6 (six) hours as needed for moderate pain (pain score 4-6). (Patient not taking: Reported on 12/14/2023)   [DISCONTINUED] traZODone  (DESYREL ) 100 MG tablet Take 100 mg by mouth at bedtime. (Patient not taking: Reported on 12/14/2023)   [DISCONTINUED]  Zinc 50 MG TABS Take 50 mg by mouth daily. (Patient not taking: Reported on 12/14/2023)   No facility-administered encounter medications on file as of 12/14/2023.     Patient Active Problem List   Diagnosis Date Noted   History of removal of joint prosthesis of hip due to infection 06/08/2023   Upper GI bleed 10/17/2022   Gastrointestinal hemorrhage with hematemesis 10/16/2022   Dementia without  behavioral disturbance (HCC) 10/16/2022    Class: Chronic   Small bowel obstruction (HCC) 10/16/2022    Class: Acute   Acute esophagitis 10/16/2022   Secondary esophageal varices without bleeding (HCC) 10/16/2022   Infection of prosthetic joint 10/08/2022   Traumatic hematoma of right knee 10/19/2017   Septic arthritis of knee, left (HCC) 05/04/2017   Hepatic cirrhosis due to chronic hepatitis C infection (HCC) 10/29/2016   Constipation 10/29/2016   History of revision of total knee arthroplasty 11/12/2009   KNEE PAIN, ACUTE 11/01/2008   PYELONEPHRITIS, ACUTE 05/28/2007   HERPES, GENITAL NEC 11/19/2006   GOUT NOS 11/19/2006   SMOKER 11/19/2006   HYPERTENSION, BENIGN ESSENTIAL 11/19/2006   BRONCHITIS, CHRONIC NEC 11/19/2006   Umbilical hernia 11/19/2006   HIV DISEASE 07/03/2006   HEPATITIS C 07/03/2006   SYPHILIS, EARLY, SYMPTOMATIC, PRIMARY NEC 07/03/2006   Osteoarthritis 07/03/2006     Health Maintenance Due  Topic Date Due   Zoster Vaccines- Shingrix (1 of 2) Never done   Colonoscopy  Never done   DTaP/Tdap/Td (3 - Td or Tdap) 03/06/2018   COVID-19 Vaccine (4 - 2025-26 season) 10/05/2023     Review of Systems 12 point ros is negative except what is mentioned above Physical Exam   BP (!) 142/87   Pulse 76   Temp 97.9 F (36.6 C) (Oral)   SpO2 96%   Physical Exam  Constitutional: He is oriented to person, place, and time. He appears well-developed and well-nourished. No distress.  HENT:  Mouth/Throat: Oropharynx is clear and moist. No oropharyngeal exudate.  Cardiovascular: Normal rate, regular rhythm and normal heart sounds. Exam reveals no gallop and no friction rub.  No murmur heard.  Pulmonary/Chest: Effort normal and breath sounds normal. No respiratory distress. He has no wheezes.  Abdominal: Soft. Bowel sounds are normal. He exhibits no distension. There is no tenderness.  Lymphadenopathy:  He has no cervical adenopathy.  Neurological: He is alert and  oriented to person, place, and time.  Skin: Skin is warm and dry. No rash noted. No erythema.  Psychiatric: He has a normal mood and affect. His behavior is normal.   Lab Results  Component Value Date   CD4TCELL 14 (L) 04/24/2023   Lab Results  Component Value Date   CD4TABS 125 (L) 11/13/2022   CD4TABS 140 (L) 05/07/2017   CD4TABS 470 10/29/2009   Lab Results  Component Value Date   HIV1RNAQUANT <20 DETECTED (A) 04/24/2023   Lab Results  Component Value Date   HEPBSAB No 03/30/2006   Lab Results  Component Value Date   LABRPR NON-REACTIVE 04/24/2023    CBC Lab Results  Component Value Date   WBC 9.8 06/10/2023   RBC 3.74 (L) 06/10/2023   HGB 11.7 (L) 06/10/2023   HCT 34.9 (L) 06/10/2023   PLT 86 (L) 06/10/2023   MCV 93.3 06/10/2023   MCH 31.3 06/10/2023   MCHC 33.5 06/10/2023   RDW 16.9 (H) 06/10/2023   LYMPHSABS 796 (L) 11/13/2022   MONOABS 1.0 10/09/2017   EOSABS 162 04/24/2023    BMET Lab Results  Component Value Date   NA 134 (L) 06/09/2023   K 4.3 06/09/2023   CL 106 06/09/2023   CO2 21 (L) 06/09/2023   GLUCOSE 187 (H) 06/09/2023   BUN 24 (H) 06/09/2023   CREATININE 1.06 06/09/2023   CALCIUM 8.7 (L) 06/09/2023   GFRNONAA >60 06/09/2023   GFRAA 48 (L) 10/20/2017      Assessment and Plan HIV disease = will check cd 4 count and VL but also Refill biktarvy  and   Oi proph = will give refill bactrim    Long term medication management = will check cr and cbc to see at his baseline  Hypertension = just recently took his bp meds; not at goal this morning but his home meds not yet taking effect  Hx of syphilis = will check rpr

## 2023-12-15 LAB — T-HELPER CELL (CD4) - (RCID CLINIC ONLY)
CD4 % Helper T Cell: 15 % — ABNORMAL LOW (ref 33–65)
CD4 T Cell Abs: 98 /uL — ABNORMAL LOW (ref 400–1790)

## 2023-12-16 LAB — CBC WITH DIFFERENTIAL/PLATELET
Absolute Lymphocytes: 784 {cells}/uL — ABNORMAL LOW (ref 850–3900)
Absolute Monocytes: 704 {cells}/uL (ref 200–950)
Basophils Absolute: 20 {cells}/uL (ref 0–200)
Basophils Relative: 0.5 %
Eosinophils Absolute: 200 {cells}/uL (ref 15–500)
Eosinophils Relative: 5 %
HCT: 44.4 % (ref 38.5–50.0)
Hemoglobin: 14.5 g/dL (ref 13.2–17.1)
MCH: 29.9 pg (ref 27.0–33.0)
MCHC: 32.7 g/dL (ref 32.0–36.0)
MCV: 91.5 fL (ref 80.0–100.0)
MPV: 11.3 fL (ref 7.5–12.5)
Monocytes Relative: 17.6 %
Neutro Abs: 2292 {cells}/uL (ref 1500–7800)
Neutrophils Relative %: 57.3 %
Platelets: 143 Thousand/uL (ref 140–400)
RBC: 4.85 Million/uL (ref 4.20–5.80)
RDW: 14.1 % (ref 11.0–15.0)
Total Lymphocyte: 19.6 %
WBC: 4 Thousand/uL (ref 3.8–10.8)

## 2023-12-16 LAB — COMPLETE METABOLIC PANEL WITHOUT GFR
AG Ratio: 0.8 (calc) — ABNORMAL LOW (ref 1.0–2.5)
ALT: 84 U/L — ABNORMAL HIGH (ref 9–46)
AST: 125 U/L — ABNORMAL HIGH (ref 10–35)
Albumin: 3.7 g/dL (ref 3.6–5.1)
Alkaline phosphatase (APISO): 124 U/L (ref 35–144)
BUN: 23 mg/dL (ref 7–25)
CO2: 26 mmol/L (ref 20–32)
Calcium: 9.5 mg/dL (ref 8.6–10.3)
Chloride: 103 mmol/L (ref 98–110)
Creat: 1.13 mg/dL (ref 0.70–1.28)
Globulin: 4.9 g/dL — ABNORMAL HIGH (ref 1.9–3.7)
Glucose, Bld: 85 mg/dL (ref 65–99)
Potassium: 5 mmol/L (ref 3.5–5.3)
Sodium: 137 mmol/L (ref 135–146)
Total Bilirubin: 0.8 mg/dL (ref 0.2–1.2)
Total Protein: 8.6 g/dL — ABNORMAL HIGH (ref 6.1–8.1)

## 2023-12-16 LAB — HIV-1 RNA QUANT-NO REFLEX-BLD
HIV 1 RNA Quant: NOT DETECTED {copies}/mL
HIV-1 RNA Quant, Log: NOT DETECTED {Log_copies}/mL

## 2023-12-17 ENCOUNTER — Other Ambulatory Visit: Payer: Self-pay

## 2023-12-17 ENCOUNTER — Other Ambulatory Visit (HOSPITAL_COMMUNITY): Payer: Self-pay

## 2023-12-22 DIAGNOSIS — Z13820 Encounter for screening for osteoporosis: Secondary | ICD-10-CM | POA: Diagnosis not present

## 2023-12-22 DIAGNOSIS — M8588 Other specified disorders of bone density and structure, other site: Secondary | ICD-10-CM | POA: Diagnosis not present

## 2023-12-22 DIAGNOSIS — M810 Age-related osteoporosis without current pathological fracture: Secondary | ICD-10-CM | POA: Diagnosis not present

## 2023-12-25 ENCOUNTER — Other Ambulatory Visit (HOSPITAL_COMMUNITY): Payer: Self-pay

## 2023-12-29 DIAGNOSIS — I10 Essential (primary) hypertension: Secondary | ICD-10-CM | POA: Diagnosis not present

## 2023-12-29 DIAGNOSIS — N182 Chronic kidney disease, stage 2 (mild): Secondary | ICD-10-CM | POA: Diagnosis not present

## 2024-01-04 NOTE — Progress Notes (Signed)
 The ASCVD Risk score (Arnett DK, et al., 2019) failed to calculate for the following reasons:   Cannot find a previous HDL lab   Cannot find a previous total cholesterol lab  Arlon Bergamo, BSN, RN

## 2024-01-08 ENCOUNTER — Other Ambulatory Visit: Payer: Self-pay

## 2024-01-08 NOTE — Progress Notes (Signed)
 Specialty Pharmacy Refill Coordination Note  Logan Levine is a 73 y.o. male contacted today regarding refills of specialty medication(s) Bictegravir-Emtricitab-Tenofov (BIKTARVY )   Patient requested Delivery   Delivery date: 01/18/24   Verified address: [address] [name] [address]   Medication will be filled on: 01/15/24

## 2024-01-15 ENCOUNTER — Other Ambulatory Visit: Payer: Self-pay

## 2024-01-21 DIAGNOSIS — K219 Gastro-esophageal reflux disease without esophagitis: Secondary | ICD-10-CM | POA: Diagnosis not present

## 2024-01-21 DIAGNOSIS — J449 Chronic obstructive pulmonary disease, unspecified: Secondary | ICD-10-CM | POA: Diagnosis not present

## 2024-01-21 DIAGNOSIS — B20 Human immunodeficiency virus [HIV] disease: Secondary | ICD-10-CM | POA: Diagnosis not present

## 2024-01-21 DIAGNOSIS — N400 Enlarged prostate without lower urinary tract symptoms: Secondary | ICD-10-CM | POA: Diagnosis not present

## 2024-01-21 DIAGNOSIS — N182 Chronic kidney disease, stage 2 (mild): Secondary | ICD-10-CM | POA: Diagnosis not present

## 2024-01-21 DIAGNOSIS — J4540 Moderate persistent asthma, uncomplicated: Secondary | ICD-10-CM | POA: Diagnosis not present

## 2024-01-21 DIAGNOSIS — I10 Essential (primary) hypertension: Secondary | ICD-10-CM | POA: Diagnosis not present

## 2024-02-05 ENCOUNTER — Other Ambulatory Visit (HOSPITAL_COMMUNITY): Payer: Self-pay

## 2024-02-05 NOTE — Progress Notes (Signed)
 Specialty Pharmacy Refill Coordination Note  Logan Levine is a 74 y.o. male contacted today regarding refills of specialty medication(s) Bictegravir-Emtricitab-Tenofov (BIKTARVY )   Patient requested Delivery   Delivery date: 02/18/24   Verified address: [address] [name] [address]   Medication will be filled on: 02/17/24    Send bill

## 2024-02-17 ENCOUNTER — Other Ambulatory Visit (HOSPITAL_COMMUNITY): Payer: Self-pay

## 2024-02-17 ENCOUNTER — Other Ambulatory Visit: Payer: Self-pay

## 2024-02-22 ENCOUNTER — Other Ambulatory Visit (HOSPITAL_COMMUNITY): Payer: Self-pay

## 2024-02-22 ENCOUNTER — Other Ambulatory Visit: Payer: Self-pay

## 2024-03-11 ENCOUNTER — Other Ambulatory Visit (HOSPITAL_COMMUNITY): Payer: Self-pay

## 2024-03-11 NOTE — Progress Notes (Signed)
 Specialty Pharmacy Refill Coordination Note  Logan Levine is a 74 y.o. male contacted today regarding refills of specialty medication(s) Bictegravir-Emtricitab-Tenofov (BIKTARVY )   Patient requested Delivery   Delivery date: 03/18/24   Verified address: [address] [name] [address]   Medication will be filled on: 03/17/24
